# Patient Record
Sex: Female | Born: 1980 | Race: White | Hispanic: No | Marital: Married | Smoking: Never smoker
Health system: Southern US, Community
[De-identification: ages and names within clinical notes are randomized; demographics above are authoritative.]

## PROBLEM LIST (undated history)

## (undated) ENCOUNTER — Inpatient Hospital Stay (HOSPITAL_COMMUNITY): Payer: Self-pay

## (undated) ENCOUNTER — Emergency Department (HOSPITAL_COMMUNITY): Payer: BC Managed Care – PPO

## (undated) DIAGNOSIS — R030 Elevated blood-pressure reading, without diagnosis of hypertension: Secondary | ICD-10-CM

## (undated) DIAGNOSIS — Z8669 Personal history of other diseases of the nervous system and sense organs: Secondary | ICD-10-CM

## (undated) DIAGNOSIS — K219 Gastro-esophageal reflux disease without esophagitis: Secondary | ICD-10-CM

## (undated) DIAGNOSIS — Z9221 Personal history of antineoplastic chemotherapy: Secondary | ICD-10-CM

## (undated) DIAGNOSIS — F419 Anxiety disorder, unspecified: Secondary | ICD-10-CM

## (undated) DIAGNOSIS — C801 Malignant (primary) neoplasm, unspecified: Secondary | ICD-10-CM

## (undated) DIAGNOSIS — Z800 Family history of malignant neoplasm of digestive organs: Secondary | ICD-10-CM

## (undated) DIAGNOSIS — C50412 Malignant neoplasm of upper-outer quadrant of left female breast: Secondary | ICD-10-CM

## (undated) DIAGNOSIS — Z808 Family history of malignant neoplasm of other organs or systems: Secondary | ICD-10-CM

## (undated) DIAGNOSIS — Z9889 Other specified postprocedural states: Secondary | ICD-10-CM

## (undated) DIAGNOSIS — T300 Burn of unspecified body region, unspecified degree: Secondary | ICD-10-CM

## (undated) DIAGNOSIS — T4145XA Adverse effect of unspecified anesthetic, initial encounter: Secondary | ICD-10-CM

## (undated) DIAGNOSIS — I10 Essential (primary) hypertension: Secondary | ICD-10-CM

## (undated) DIAGNOSIS — Z170 Estrogen receptor positive status [ER+]: Secondary | ICD-10-CM

## (undated) DIAGNOSIS — R112 Nausea with vomiting, unspecified: Secondary | ICD-10-CM

## (undated) HISTORY — DX: Family history of malignant neoplasm of digestive organs: Z80.0

## (undated) HISTORY — DX: Family history of malignant neoplasm of other organs or systems: Z80.8

## (undated) HISTORY — DX: Malignant (primary) neoplasm, unspecified: C80.1

---
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
## 2002-02-17 ENCOUNTER — Other Ambulatory Visit: Admission: RE | Admit: 2002-02-17 | Discharge: 2002-02-17 | Payer: Self-pay | Admitting: Obstetrics and Gynecology

## 2004-09-16 ENCOUNTER — Ambulatory Visit: Payer: Self-pay | Admitting: Internal Medicine

## 2004-09-20 ENCOUNTER — Ambulatory Visit: Payer: Self-pay | Admitting: Internal Medicine

## 2004-12-04 ENCOUNTER — Emergency Department (HOSPITAL_COMMUNITY): Admission: EM | Admit: 2004-12-04 | Discharge: 2004-12-05 | Payer: Self-pay | Admitting: Emergency Medicine

## 2005-03-28 ENCOUNTER — Emergency Department (HOSPITAL_COMMUNITY): Admission: EM | Admit: 2005-03-28 | Discharge: 2005-03-29 | Payer: Self-pay | Admitting: *Deleted

## 2005-04-20 ENCOUNTER — Ambulatory Visit: Payer: Self-pay | Admitting: Internal Medicine

## 2005-05-18 ENCOUNTER — Other Ambulatory Visit: Admission: RE | Admit: 2005-05-18 | Discharge: 2005-05-18 | Payer: Self-pay | Admitting: Obstetrics and Gynecology

## 2005-08-09 ENCOUNTER — Observation Stay (HOSPITAL_COMMUNITY): Admission: AD | Admit: 2005-08-09 | Discharge: 2005-08-10 | Payer: Self-pay | Admitting: Internal Medicine

## 2005-08-09 ENCOUNTER — Ambulatory Visit: Payer: Self-pay | Admitting: Internal Medicine

## 2005-08-10 ENCOUNTER — Ambulatory Visit: Payer: Self-pay | Admitting: Internal Medicine

## 2011-09-18 ENCOUNTER — Emergency Department (HOSPITAL_COMMUNITY)
Admission: EM | Admit: 2011-09-18 | Discharge: 2011-09-19 | Disposition: A | Discharge status: Home / Self Care | Payer: Self-pay | Attending: Emergency Medicine | Admitting: Emergency Medicine

## 2011-09-18 ENCOUNTER — Emergency Department (HOSPITAL_COMMUNITY): Payer: Self-pay

## 2011-09-18 DIAGNOSIS — S92309A Fracture of unspecified metatarsal bone(s), unspecified foot, initial encounter for closed fracture: Secondary | ICD-10-CM | POA: Insufficient documentation

## 2011-09-18 DIAGNOSIS — E860 Dehydration: Secondary | ICD-10-CM | POA: Insufficient documentation

## 2011-09-18 DIAGNOSIS — Y9289 Other specified places as the place of occurrence of the external cause: Secondary | ICD-10-CM | POA: Insufficient documentation

## 2011-09-18 DIAGNOSIS — E876 Hypokalemia: Secondary | ICD-10-CM | POA: Insufficient documentation

## 2011-09-18 DIAGNOSIS — X500XXA Overexertion from strenuous movement or load, initial encounter: Secondary | ICD-10-CM | POA: Insufficient documentation

## 2011-09-18 DIAGNOSIS — R55 Syncope and collapse: Secondary | ICD-10-CM | POA: Insufficient documentation

## 2011-09-18 LAB — CBC WITH DIFFERENTIAL/PLATELET
Basophils Absolute: 0 10*3/uL (ref 0.0–0.1)
Eosinophils Absolute: 0 10*3/uL (ref 0.0–0.7)
Eosinophils Relative: 0 % (ref 0–5)
HCT: 33.4 % — ABNORMAL LOW (ref 36.0–46.0)
Hemoglobin: 11.5 g/dL — ABNORMAL LOW (ref 12.0–15.0)
Lymphs Abs: 0.9 10*3/uL (ref 0.7–4.0)
MCH: 33.5 pg (ref 26.0–34.0)
MCV: 97.4 fL (ref 78.0–100.0)
Monocytes Absolute: 0.7 10*3/uL (ref 0.1–1.0)
Monocytes Relative: 6 % (ref 3–12)
Platelets: 271 10*3/uL (ref 150–400)
RBC: 3.43 MIL/uL — ABNORMAL LOW (ref 3.87–5.11)
WBC: 12.4 10*3/uL — ABNORMAL HIGH (ref 4.0–10.5)

## 2011-09-18 LAB — URINALYSIS, ROUTINE W REFLEX MICROSCOPIC
Bilirubin Urine: NEGATIVE
Leukocytes, UA: NEGATIVE
Nitrite: NEGATIVE
Protein, ur: NEGATIVE mg/dL
pH: 6.5 (ref 5.0–8.0)

## 2011-09-18 LAB — BASIC METABOLIC PANEL
CO2: 22 mEq/L (ref 19–32)
Calcium: 9 mg/dL (ref 8.4–10.5)
Creatinine, Ser: 0.52 mg/dL (ref 0.50–1.10)
GFR calc non Af Amer: >90 mL/min (ref >90)

## 2011-09-18 LAB — POTASSIUM: Potassium: 3.6 mEq/L (ref 3.5–5.1)

## 2011-09-18 LAB — POCT PREGNANCY, URINE: Preg Test, Ur: NEGATIVE

## 2011-09-18 MED ORDER — OXYCODONE-ACETAMINOPHEN 5-325 MG PO TABS: 1.0000 | ORAL_TABLET | Freq: Four times a day (QID) | ORAL | Status: AC | PRN

## 2011-09-18 MED ORDER — OXYCODONE-ACETAMINOPHEN 5-325 MG PO TABS
1.0000 | ORAL_TABLET | Freq: Once | ORAL | Status: AC
  Administered 2011-09-18: 1 via ORAL
  Filled 2011-09-18: qty 1

## 2011-09-18 MED ORDER — POTASSIUM CHLORIDE CRYS ER 20 MEQ PO TBCR
40.0000 meq | EXTENDED_RELEASE_TABLET | Freq: Once | ORAL | Status: AC
  Administered 2011-09-18: 40 meq via ORAL
  Filled 2011-09-18: qty 2

## 2011-09-18 MED ORDER — POTASSIUM CHLORIDE 10 MEQ/100ML IV SOLN
10.0000 meq | Freq: Once | INTRAVENOUS | Status: AC
  Administered 2011-09-18: 10 meq via INTRAVENOUS
  Filled 2011-09-18: qty 100

## 2011-09-18 MED ORDER — SODIUM CHLORIDE 0.9 % IV BOLUS (SEPSIS)
1000.0000 mL | Freq: Once | INTRAVENOUS | Status: AC
  Administered 2011-09-18: 1000 mL via INTRAVENOUS

## 2011-09-18 NOTE — ED Provider Notes (Addendum)
History     CSN: [id number]  Arrival date & time 09/18/11  1649   First MD Initiated Contact with Patient 09/18/11 1658      Chief Complaint  Patient presents with  . Seizures    (Consider location/radiation/quality/duration/timing/severity/associated sxs/prior treatment) Patient is a 31 y.o. female presenting with syncope. The history is provided by the patient.  Loss of Consciousness This is a new (EMS states that the patient was at work and became unconscious. Workers state that she was shaking and when they got there she initially was hysterical and then returned to normal after a few seconds.) problem. The current episode started less than 1 hour ago. The problem occurs constantly. The problem has been resolved. Pertinent negatives include no abdominal pain and no shortness of breath. Associated symptoms comments: Patient was sitting in a chair reading small print when she started to feel dizzy and the next thing she knew there were people standing around her. Nothing aggravates the symptoms. Nothing relieves the symptoms. She has tried nothing for the symptoms. The treatment provided significant relief.    No past medical history on file.  No past surgical history on file.  No family history on file.  History  Substance Use Topics  . Smoking status: Not on file  . Smokeless tobacco: Not on file  . Alcohol Use: Not on file    OB History    No data available      Review of Systems  Constitutional: Positive for fatigue. Negative for fever.  Respiratory: Negative for cough and shortness of breath.   Cardiovascular: Positive for syncope.  Gastrointestinal: Negative for nausea, vomiting and abdominal pain.  Genitourinary: Negative for dysuria.  All other systems reviewed and are negative.    Allergies  Review of patient's allergies indicates not on file.  Home Medications  No current outpatient prescriptions on file.  BP 142/99  Pulse 125  Temp 98.1 F (36.7  C) (Oral)  Resp 18  SpO2 100%  Physical Exam  Nursing note and vitals reviewed. Constitutional: She is oriented to person, place, and time. She appears well-developed and well-nourished. No distress.  HENT:  Head: Normocephalic and atraumatic.  Right Ear: Tympanic membrane and ear canal normal.  Left Ear: Tympanic membrane and ear canal normal.  Eyes: EOM are normal. Pupils are equal, round, and reactive to light.  Fundoscopic exam:      The right eye shows no papilledema.       The left eye shows no papilledema.  Neck: Normal range of motion. Neck supple.  Cardiovascular: Regular rhythm, normal heart sounds and intact distal pulses.  Tachycardia present.  Exam reveals no friction rub.   No murmur heard. Pulmonary/Chest: Effort normal and breath sounds normal. She has no wheezes. She has no rales.  Abdominal: Soft. Bowel sounds are normal. She exhibits no distension. There is no tenderness. There is no rebound and no guarding.  Musculoskeletal: Normal range of motion. She exhibits no tenderness.       No edema  Lymphadenopathy:    She has no cervical adenopathy.  Neurological: She is alert and oriented to person, place, and time. She has normal strength. No cranial nerve deficit or sensory deficit. Gait normal.       No photophobia  Skin: Skin is warm and dry. No rash noted.  Psychiatric: She has a normal mood and affect. Her behavior is normal.    ED Course  Procedures (including critical care time)  Labs Reviewed  CBC  WITH DIFFERENTIAL - Abnormal; Notable for the following:    WBC 12.4 (*)     RBC 3.43 (*)     Hemoglobin 11.5 (*)     HCT 33.4 (*)     Neutrophils Relative 87 (*)     Neutro Abs 10.8 (*)     Lymphocytes Relative 7 (*)     All other components within normal limits  BASIC METABOLIC PANEL - Abnormal; Notable for the following:    Sodium 132 (*)     Potassium 2.8 (*)     Glucose, Bld 115 (*)     All other components within normal limits  URINALYSIS,  ROUTINE W REFLEX MICROSCOPIC  POCT PREGNANCY, URINE   Ct Head Wo Contrast  09/18/2011  *RADIOLOGY REPORT*  Clinical Data: Seizure.  CT HEAD WITHOUT CONTRAST  Technique:  Contiguous axial images were obtained from the base of the skull through the vertex without contrast.  Comparison: None.  Findings: The brain stem, cerebellum, cerebral peduncles, thalami, basal ganglia, basilar cisterns, and ventricular system appear unremarkable.  No intracranial hemorrhage, mass lesion, or acute infarction is identified.  Left parietal scalp hematoma noted.  IMPRESSION:  1.  Left frontal scalp hematoma.   Otherwise, no significant abnormality identified.  Original Report Authenticated By: Dellia Cloud, M.D.   Dg Foot Complete Left  09/18/2011  *RADIOLOGY REPORT*  Clinical Data: Left foot pain after twisting of the left foot. Seizure.  LEFT FOOT - COMPLETE 3+ VIEW  Comparison: None.  Findings: There are transverse minimally displaced fractures of the proximal left second, third, and fourth metatarsal shafts.  No focal bone lesion or bone destruction.  Mild soft tissue swelling.  IMPRESSION: Transverse fractures of the proximal left second, third, and fourth metatarsal bones.  Original Report Authenticated By: Marlon Pel, M.D.    Date: 09/18/2011  Rate: 114, artifact present  Rhythm: sinus tachycardia  QRS Axis: normal  Intervals: normal  ST/T Wave abnormalities: normal  Conduction Disutrbances:none  Narrative Interpretation:   Old EKG Reviewed: none available    1. Hypokalemia   2. Dehydration   3. Metatarsal fracture   4. Syncope       MDM   Patient with no significant medical history who has what sounds like a syncopal event today. She states she was reading small print and started feeling dizzy. Bystanders state she slumped over and had shaking however she did not have tongue biting or incontinence. EMS states she woke up and initially was hysterical but then was normal. This does  not sound like a postictal phase as it only lasted several seconds and then she returned to normal. Patient denies any symptoms on exam currently. Her LMP was over a month ago and she states she may be pregnant. She has a normal neurologic exam and no focal findings. CBC, BMP, UA, UPT, EKG, head CT pending.  8:15 PM Labs wnl other than hypokalemia today and presistent tachy.  Pt is moderately dehydrated but not pregnant.  Will give potassium and a second bolus.  Pt is assymptomatic at this time.  10:29 PM When pt got up to go to the bathroom pain in the left foot.  Ecchymosis and pain over the distal foot.  Plain films show metatarsal fx of 2-4 toes.  Will place in hard sole shoe with crutches and f/u.  Improved tachycardia from 140s to 110 here. Feel that there persistent tachycardia and is due to amphetamine use through Adderall. Patient states she's going to speak  with her doctor about going off of the Adderall she always feels a bit jittery    Gwyneth Sprout, MD 09/18/11 2016  Gwyneth Sprout, MD 09/18/11 2230  Gwyneth Sprout, MD 09/18/11 6213  Gwyneth Sprout, MD 09/18/11 2315

## 2011-09-18 NOTE — ED Notes (Signed)
Pt given food and orange juice. Okayed by EDP.

## 2011-09-18 NOTE — ED Notes (Signed)
Patient waiting to go home

## 2011-09-18 NOTE — ED Notes (Signed)
To ED, via EMS, for eval of witnessed seizure. Pt was at work, told her coworkers that her vision was blurred, slumped in the chair and had full body seizure. No Hx of seizures. EMS described pt as being posticle on their arrival. Pt now alert.

## 2011-09-18 NOTE — Progress Notes (Signed)
Orthopedic Tech Progress Note Patient Details:  Bethany Hanna Jul 11, 1980 [id number] XL postop shoe and crutches Ortho Devices Type of Ortho Device: Crutches;Postop boot Ortho Device/Splint Location: applied to Left foot Ortho Device/Splint Interventions: Application   Asia R Thompson 09/18/2011, 11:47 PM

## 2011-09-19 NOTE — ED Notes (Signed)
Instructed on use of crutches, pain meds and when to call MD  Voiced understanding

## 2012-01-19 ENCOUNTER — Encounter: Payer: Self-pay | Admitting: Family Medicine

## 2012-01-19 ENCOUNTER — Ambulatory Visit: Payer: Self-pay | Admitting: Family Medicine

## 2012-01-19 VITALS — BP 140/95 | HR 108 | Temp 97.7°F | Resp 18 | Ht 69.0 in | Wt 164.6 lb

## 2012-01-19 DIAGNOSIS — R1110 Vomiting, unspecified: Secondary | ICD-10-CM

## 2012-01-19 DIAGNOSIS — R110 Nausea: Secondary | ICD-10-CM

## 2012-01-19 LAB — POCT URINALYSIS DIPSTICK
Glucose, UA: NEGATIVE
Leukocytes, UA: NEGATIVE
Nitrite, UA: NEGATIVE

## 2012-01-19 LAB — POCT UA - MICROSCOPIC ONLY
Casts, Ur, LPF, POC: NEGATIVE
Mucus, UA: NEGATIVE

## 2012-01-19 LAB — POCT CBC
HCT, POC: 42.7 % (ref 37.7–47.9)
Hemoglobin: 13.2 g/dL (ref 12.2–16.2)
Lymph, poc: 2 (ref 0.6–3.4)
MCHC: 30.9 g/dL — AB (ref 31.8–35.4)
MCV: 91.6 fL (ref 80–97)
MID (cbc): 0.5 (ref 0–0.9)
RBC: 4.66 M/uL (ref 4.04–5.48)
WBC: 15.1 10*3/uL — AB (ref 4.6–10.2)

## 2012-01-19 MED ORDER — ONDANSETRON HCL 8 MG PO TABS: 8.0000 mg | ORAL_TABLET | Freq: Three times a day (TID) | ORAL | Status: DC | PRN

## 2012-01-19 MED ORDER — ONDANSETRON 8 MG PO TBDP: 8.0000 mg | ORAL_TABLET | Freq: Three times a day (TID) | ORAL | Status: DC | PRN

## 2012-01-19 MED ORDER — ONDANSETRON 4 MG PO TBDP
8.0000 mg | ORAL_TABLET | Freq: Once | ORAL | Status: AC
  Administered 2012-01-19: 8 mg via ORAL

## 2012-01-19 NOTE — Patient Instructions (Signed)
Work on slow sips of fluids. Fill and use the zofran rx if you need it.  If you feel like eating start with bland foods such as crackers, bananas, toast, applesauce.  If you are not getting better and able to tolerate oral fluids tomorrow please come back in and we can give you more fluids. Alternatively if you become more ill tonight you can go to the ED for further treatment

## 2012-01-19 NOTE — Progress Notes (Signed)
[facility] [address] [place] [address] [phone]  Date:  01/19/2012   Name:  Bethany Hanna   DOB:  1980/03/22   MRN:  [id number]  PCP:  Janace Hoard, MD    Chief Complaint: Emesis   History of Present Illness:  Bethany Hanna is a 31 y.o. very pleasant female patient who presents with the following:  She noted onset of illness at 0430 this am- she woke up with nausea and vomiting.  She has been very nauseated all day and has thrown up repeatedly.  She has thrown up everything she tried to drink, she has not tried to eat anything.  She does not have any particular pain, but just feels tired and achy all over.   2 weeks ago she was ill with a bad cold- fever, chills. She may have had the flu.  She finally got better last weekend.    Last night she went out to dinner and had a few drinks but nothing excessive.  No one else who ate with them got sick.   She has not had diarrhea.  No abdominal pain.  No urinary symptoms.  She has not noted a fever.   She also did have her period about 2 weeks ago- she is now having some spotting.  However, this is not too unusual for her.  She and her husband are trying to conceive so she is not on any contraception currently.  She is on PNV but no other medications at this time  She is generally healthy.    There is no problem list on file for this patient.   No past medical history on file.  No past surgical history on file.  History  Substance Use Topics  . Smoking status: Not on file  . Smokeless tobacco: Not on file  . Alcohol Use: Not on file    No family history on file.  No Known Allergies  Medication list has been reviewed and updated.  Current Outpatient Prescriptions on File Prior to Visit  Medication Sig Dispense Refill  . ibuprofen (ADVIL,MOTRIN) 200 MG tablet Take 600 mg by mouth every 6 (six) hours as needed. For pain      . Prenatal Vit-Fe Fumarate-FA (PRENATAL MULTIVITAMIN) TABS  Take 1 tablet by mouth daily.      Marland Kitchen amphetamine-dextroamphetamine (ADDERALL XR) 30 MG 24 hr capsule Take 30 mg by mouth daily.        Review of Systems:  As per HPI- otherwise negative.    Physical Examination: Filed Vitals:   01/19/12 1637  BP: 124/70  Pulse: 121  Temp: 97.5 F (36.4 C)  Resp: 18   Filed Vitals:   01/19/12 1637  Height: 5\' 9"  (1.753 m)  Weight: 164 lb 9.6 oz (74.662 kg)   Body mass index is 24.31 kg/(m^2). Ideal Body Weight: Weight in (lb) to have BMI = 25: 168.9   GEN: WDWN, NAD, Non-toxic, A & O x 3.   HEENT: Atraumatic, Normocephalic. Neck supple. No masses, No LAD.  Bilateral TM wnl, oropharynx normal.  PEERL,EOMI.   Ears and Nose: No external deformity. CV: RRR, No M/G/R. No JVD. No thrill. No extra heart sounds. Tachycardia.  PULM: CTA B, no wheezes, crackles, rhonchi. No retractions. No resp. distress. No accessory muscle use. ABD: S, NT, ND, +BS. No rebound. No HSM. Abdomen is benign.  No CVA tenderness  EXTR: No c/c/e NEURO Normal gait.  PSYCH: Normally interactive. Conversant. Not depressed  or anxious appearing.  Calm demeanor.   Results for orders placed in visit on 01/19/12  POCT CBC      Component Value Range   WBC 15.1 (*) 4.6 - 10.2 K/uL   Lymph, poc 2.0  0.6 - 3.4   POC LYMPH PERCENT 13.0  10 - 50 %L   MID (cbc) 0.5  0 - 0.9   POC MID % 3.4  0 - 12 %M   POC Granulocyte 12.6 (*) 2 - 6.9   Granulocyte percent 83.6 (*) 37 - 80 %G   RBC 4.66  4.04 - 5.48 M/uL   Hemoglobin 13.2  12.2 - 16.2 g/dL   HCT, POC 98.1  19.1 - 47.9 %   MCV 91.6  80 - 97 fL   MCH, POC 28.3  27 - 31.2 pg   MCHC 30.9 (*) 31.8 - 35.4 g/dL   RDW, POC 47.8     Platelet Count, POC 527 (*) 142 - 424 K/uL   MPV 7.7  0 - 99.8 fL  POCT URINALYSIS DIPSTICK      Component Value Range   Color, UA light yellow     Clarity, UA clear     Glucose, UA neg     Bilirubin, UA neg     Ketones, UA 160     Spec Grav, UA 1.020     Blood, UA large     pH, UA 7.0      Protein, UA neg     Urobilinogen, UA 0.2     Nitrite, UA neg     Leukocytes, UA Negative    POCT UA - MICROSCOPIC ONLY      Component Value Range   WBC, Ur, HPF, POC 0-1     RBC, urine, microscopic 1-3     Bacteria, U Microscopic 1+     Mucus, UA neg     Epithelial cells, urine per micros 1-2     Crystals, Ur, HPF, POC neg     Casts, Ur, LPF, POC neg     Yeast, UA neg    POCT URINE PREGNANCY      Component Value Range   Preg Test, Ur Negative      Given zofran 8mg  ODT and also 2 L of IV NS for hydration.  She felt better and was able to eat saltines and drink gatorade. Tachycardia improved.   Assessment and Plan: 1. Vomiting  POCT CBC, POCT urinalysis dipstick, POCT UA - Microscopic Only, ondansetron (ZOFRAN-ODT) disintegrating tablet 8 mg, POCT urine pregnancy, ondansetron (ZOFRAN) 8 MG tablet, DISCONTINUED: ondansetron (ZOFRAN-ODT) 8 MG disintegrating tablet  2. Nausea  POCT urinalysis dipstick, POCT UA - Microscopic Only, ondansetron (ZOFRAN-ODT) disintegrating tablet 8 mg   See patient instructions for more details.  Bethany Hanna felt better following her treatment and was comfortable to go home with her husband.  She did not vomit during the couple of hours she was here in clinic.   See patient instructions for more details.    She has a little bit of bacteria in her urine- however no UTI symptoms.  She will let us know if she has any UTI symptoms.   Meds ordered this encounter  Medications  . acetaminophen (TYLENOL) 325 MG tablet    Sig: Take 325 mg by mouth every 6 (six) hours as needed.  Marland Kitchen DISCONTD: ondansetron (ZOFRAN-ODT) 8 MG disintegrating tablet    Sig: Take 1 tablet (8 mg total) by mouth every 8 (eight) hours as needed for  nausea.    Dispense:  30 tablet    Refill:  0  . ondansetron (ZOFRAN-ODT) disintegrating tablet 8 mg    Sig:   . ondansetron (ZOFRAN) 8 MG tablet    Sig: Take 1 tablet (8 mg total) by mouth every 8 (eight) hours as needed for nausea.    Dispense:   5 tablet    Refill:  0      Ceniyah Thorp, MD

## 2012-01-20 ENCOUNTER — Telehealth: Payer: Self-pay | Admitting: Family Medicine

## 2012-01-20 DIAGNOSIS — D72829 Elevated white blood cell count, unspecified: Secondary | ICD-10-CM

## 2012-01-20 NOTE — Telephone Encounter (Signed)
Called and spoke with Bethany Hanna- she is feeling much better.  She will let us know if she gets worse again. Also asked her to have a recheck of her CBC at her convenience to ensure her leukocytosis resolves- she plans to do this in a couple of weeks.  Will leave an order on her chart, she will not need to see a provider to have this done.

## 2013-06-24 ENCOUNTER — Other Ambulatory Visit (HOSPITAL_COMMUNITY): Payer: Self-pay | Admitting: Obstetrics & Gynecology

## 2013-06-24 DIAGNOSIS — IMO0002 Reserved for concepts with insufficient information to code with codable children: Secondary | ICD-10-CM

## 2013-06-26 ENCOUNTER — Encounter (INDEPENDENT_AMBULATORY_CARE_PROVIDER_SITE_OTHER): Payer: Self-pay

## 2013-06-26 ENCOUNTER — Ambulatory Visit (HOSPITAL_COMMUNITY)
Admission: RE | Admit: 2013-06-26 | Discharge: 2013-06-26 | Disposition: A | Discharge status: Home / Self Care | Payer: BC Managed Care – PPO | Source: Ambulatory Visit | Attending: Obstetrics & Gynecology | Admitting: Obstetrics & Gynecology

## 2013-06-26 DIAGNOSIS — IMO0002 Reserved for concepts with insufficient information to code with codable children: Secondary | ICD-10-CM

## 2013-06-26 DIAGNOSIS — N979 Female infertility, unspecified: Secondary | ICD-10-CM | POA: Insufficient documentation

## 2013-06-26 MED ORDER — IOHEXOL 300 MG/ML  SOLN
20.0000 mL | Freq: Once | INTRAMUSCULAR | Status: AC | PRN
  Administered 2013-06-26: 20 mL

## 2013-07-30 ENCOUNTER — Other Ambulatory Visit: Payer: Self-pay | Admitting: Obstetrics & Gynecology

## 2013-07-31 ENCOUNTER — Ambulatory Visit (HOSPITAL_COMMUNITY)
Admission: RE | Admit: 2013-07-31 | Discharge: 2013-07-31 | Disposition: A | Discharge status: Home / Self Care | Payer: BC Managed Care – PPO | Source: Ambulatory Visit | Attending: Obstetrics & Gynecology | Admitting: Obstetrics & Gynecology

## 2013-07-31 ENCOUNTER — Encounter (HOSPITAL_COMMUNITY)
Admission: RE | Disposition: A | Discharge status: Home / Self Care | Payer: Self-pay | Source: Ambulatory Visit | Attending: Obstetrics & Gynecology

## 2013-07-31 ENCOUNTER — Encounter (HOSPITAL_COMMUNITY): Payer: BC Managed Care – PPO | Admitting: Anesthesiology

## 2013-07-31 ENCOUNTER — Ambulatory Visit (HOSPITAL_COMMUNITY): Payer: BC Managed Care – PPO | Admitting: Anesthesiology

## 2013-07-31 DIAGNOSIS — R9389 Abnormal findings on diagnostic imaging of other specified body structures: Secondary | ICD-10-CM | POA: Insufficient documentation

## 2013-07-31 DIAGNOSIS — N854 Malposition of uterus: Secondary | ICD-10-CM | POA: Insufficient documentation

## 2013-07-31 DIAGNOSIS — N840 Polyp of corpus uteri: Secondary | ICD-10-CM | POA: Insufficient documentation

## 2013-07-31 DIAGNOSIS — N921 Excessive and frequent menstruation with irregular cycle: Secondary | ICD-10-CM | POA: Insufficient documentation

## 2013-07-31 HISTORY — PX: DILATATION & CURRETTAGE/HYSTEROSCOPY WITH RESECTOCOPE: SHX5572

## 2013-07-31 LAB — CBC
HCT: 42 % (ref 36.0–46.0)
HEMOGLOBIN: 14 g/dL (ref 12.0–15.0)
MCH: 30.8 pg (ref 26.0–34.0)
MCHC: 33.3 g/dL (ref 30.0–36.0)
MCV: 92.3 fL (ref 78.0–100.0)
Platelets: 384 10*3/uL (ref 150–400)
RBC: 4.55 MIL/uL (ref 3.87–5.11)
RDW: 12.4 % (ref 11.5–15.5)
WBC: 10.2 10*3/uL (ref 4.0–10.5)

## 2013-07-31 LAB — PREGNANCY, URINE: Preg Test, Ur: NEGATIVE

## 2013-07-31 SURGERY — DILATATION & CURETTAGE/HYSTEROSCOPY WITH RESECTOCOPE
Anesthesia: General | Site: Vagina

## 2013-07-31 MED ORDER — FENTANYL CITRATE 0.05 MG/ML IJ SOLN
INTRAMUSCULAR | Status: DC | PRN
  Administered 2013-07-31 (×2): 50 ug via INTRAVENOUS
  Administered 2013-07-31: 100 ug via INTRAVENOUS
  Administered 2013-07-31: 50 ug via INTRAVENOUS

## 2013-07-31 MED ORDER — FENTANYL CITRATE 0.05 MG/ML IJ SOLN
INTRAMUSCULAR | Status: AC
  Filled 2013-07-31: qty 2

## 2013-07-31 MED ORDER — ONDANSETRON HCL 4 MG/2ML IJ SOLN
INTRAMUSCULAR | Status: AC
  Filled 2013-07-31: qty 2

## 2013-07-31 MED ORDER — FENTANYL CITRATE 0.05 MG/ML IJ SOLN
INTRAMUSCULAR | Status: AC
  Filled 2013-07-31: qty 5

## 2013-07-31 MED ORDER — ONDANSETRON HCL 4 MG/2ML IJ SOLN
INTRAMUSCULAR | Status: DC | PRN
  Administered 2013-07-31: 4 mg via INTRAVENOUS

## 2013-07-31 MED ORDER — CEFAZOLIN SODIUM-DEXTROSE 2-3 GM-% IV SOLR
2.0000 g | INTRAVENOUS | Status: AC
  Administered 2013-07-31: 2 g via INTRAVENOUS

## 2013-07-31 MED ORDER — KETOROLAC TROMETHAMINE 30 MG/ML IJ SOLN
INTRAMUSCULAR | Status: DC | PRN
  Administered 2013-07-31: 30 mg via INTRAMUSCULAR
  Administered 2013-07-31: 30 mg via INTRAVENOUS

## 2013-07-31 MED ORDER — LACTATED RINGERS IV SOLN
INTRAVENOUS | Status: DC
  Administered 2013-07-31 (×2): via INTRAVENOUS

## 2013-07-31 MED ORDER — GLYCINE 1.5 % IR SOLN
Status: DC | PRN
  Administered 2013-07-31: 3000 mL

## 2013-07-31 MED ORDER — LIDOCAINE HCL (CARDIAC) 20 MG/ML IV SOLN
INTRAVENOUS | Status: DC | PRN
  Administered 2013-07-31: 75 mg via INTRAVENOUS

## 2013-07-31 MED ORDER — MIDAZOLAM HCL 2 MG/2ML IJ SOLN
INTRAMUSCULAR | Status: DC | PRN
  Administered 2013-07-31: 2 mg via INTRAVENOUS

## 2013-07-31 MED ORDER — PROMETHAZINE HCL 25 MG/ML IJ SOLN: 6.2500 mg | INTRAMUSCULAR | Status: DC | PRN

## 2013-07-31 MED ORDER — FENTANYL CITRATE 0.05 MG/ML IJ SOLN
25.0000 ug | INTRAMUSCULAR | Status: DC | PRN
  Administered 2013-07-31: 25 ug via INTRAVENOUS
  Administered 2013-07-31: 50 ug via INTRAVENOUS
  Administered 2013-07-31: 25 ug via INTRAVENOUS
  Administered 2013-07-31: 50 ug via INTRAVENOUS

## 2013-07-31 MED ORDER — KETOROLAC TROMETHAMINE 30 MG/ML IJ SOLN
INTRAMUSCULAR | Status: AC
  Filled 2013-07-31: qty 1

## 2013-07-31 MED ORDER — MIDAZOLAM HCL 2 MG/2ML IJ SOLN
INTRAMUSCULAR | Status: AC
  Filled 2013-07-31: qty 2

## 2013-07-31 MED ORDER — PROPOFOL 10 MG/ML IV EMUL
INTRAVENOUS | Status: AC
  Filled 2013-07-31: qty 20

## 2013-07-31 MED ORDER — CHLOROPROCAINE HCL 1 % IJ SOLN
INTRAMUSCULAR | Status: AC
  Filled 2013-07-31: qty 30

## 2013-07-31 MED ORDER — OXYCODONE-ACETAMINOPHEN 7.5-325 MG PO TABS: 1.0000 | ORAL_TABLET | ORAL | Status: DC | PRN

## 2013-07-31 MED ORDER — CHLOROPROCAINE HCL 1 % IJ SOLN
INTRAMUSCULAR | Status: DC | PRN
  Administered 2013-07-31: 20 mL

## 2013-07-31 MED ORDER — PROPOFOL 10 MG/ML IV BOLUS
INTRAVENOUS | Status: DC | PRN
  Administered 2013-07-31: 200 mg via INTRAVENOUS

## 2013-07-31 MED ORDER — KETOROLAC TROMETHAMINE 30 MG/ML IJ SOLN: 15.0000 mg | Freq: Once | INTRAMUSCULAR | Status: DC | PRN

## 2013-07-31 MED ORDER — CEFAZOLIN SODIUM-DEXTROSE 2-3 GM-% IV SOLR
INTRAVENOUS | Status: AC
  Filled 2013-07-31: qty 50

## 2013-07-31 MED ORDER — MEPERIDINE HCL 25 MG/ML IJ SOLN: 6.2500 mg | INTRAMUSCULAR | Status: DC | PRN

## 2013-07-31 MED ORDER — LIDOCAINE HCL (CARDIAC) 20 MG/ML IV SOLN
INTRAVENOUS | Status: AC
  Filled 2013-07-31: qty 5

## 2013-07-31 MED ORDER — MIDAZOLAM HCL 2 MG/2ML IJ SOLN: 0.5000 mg | Freq: Once | INTRAMUSCULAR | Status: DC | PRN

## 2013-07-31 SURGICAL SUPPLY — 24 items
ABLATOR ENDOMETRIAL BIPOLAR (ABLATOR) IMPLANT
CANISTER SUCT 3000ML (MISCELLANEOUS) ×2 IMPLANT
CATH ROBINSON RED A/P 16FR (CATHETERS) ×2 IMPLANT
CLOTH BEACON ORANGE TIMEOUT ST (SAFETY) ×2 IMPLANT
CONTAINER PREFILL 10% NBF 60ML (FORM) ×4 IMPLANT
DRAPE HYSTEROSCOPY (DRAPE) ×2 IMPLANT
DRSG TELFA 3X8 NADH (GAUZE/BANDAGES/DRESSINGS) ×2 IMPLANT
ELECT REM PT RETURN 9FT ADLT (ELECTROSURGICAL) ×2
ELECTRODE REM PT RTRN 9FT ADLT (ELECTROSURGICAL) ×1 IMPLANT
GLOVE BIO SURGEON STRL SZ 6.5 (GLOVE) ×2 IMPLANT
GLOVE BIOGEL PI IND STRL 7.0 (GLOVE) ×1 IMPLANT
GLOVE BIOGEL PI INDICATOR 7.0 (GLOVE) ×1
GOWN STRL REUS W/TWL LRG LVL3 (GOWN DISPOSABLE) ×4 IMPLANT
LOOP ANGLED CUTTING 22FR (CUTTING LOOP) ×1 IMPLANT
NDL SPNL 22GX3.5 QUINCKE BK (NEEDLE) ×1 IMPLANT
NEEDLE SPNL 22GX3.5 QUINCKE BK (NEEDLE) ×2 IMPLANT
PACK VAGINAL MINOR WOMEN LF (CUSTOM PROCEDURE TRAY) ×2 IMPLANT
PAD DRESSING TELFA 3X8 NADH (GAUZE/BANDAGES/DRESSINGS) ×1 IMPLANT
PAD OB MATERNITY 4.3X12.25 (PERSONAL CARE ITEMS) ×2 IMPLANT
SET TUBING HYSTEROSCOPY 2 NDL (TUBING) ×1 IMPLANT
SYR CONTROL 10ML LL (SYRINGE) ×2 IMPLANT
TOWEL OR 17X24 6PK STRL BLUE (TOWEL DISPOSABLE) ×4 IMPLANT
TUBE HYSTEROSCOPY W Y-CONNECT (TUBING) ×1 IMPLANT
WATER STERILE IRR 1000ML POUR (IV SOLUTION) ×2 IMPLANT

## 2013-07-31 NOTE — Op Note (Signed)
07/31/2013  11:12 AM  PATIENT:  Bethany Hanna  33 y.o. female  PRE-OPERATIVE DIAGNOSIS:  Endometrial Polyps  POST-OPERATIVE DIAGNOSIS:  Endometrial polyps  PROCEDURE:  Procedure(s): DILATATION & CURETTAGE/HYSTEROSCOPY WITH RESECTOCOPE  SURGEON:  Surgeon(s): Princess Bruins, MD  ASSISTANTS: none   ANESTHESIA:   general  PROCEDURE:  Under general anesthesia with laryngeal mask the patient is in lithotomy position. She is prepped with Betadine on the suprapubic, vulvar and vaginal areas. She is draped as usual. The vaginal exam revealed an anteverted uterus, normal adnexae.  The speculum is inserted in the vagina. The anterior lip of the cervix is grasped with a tenaculum. The hysterometry is at 8 cm. Dilation of the cervix with Hegar dilators up to #25 without difficulty.  The operative hysteroscope with the loop is inserted in the intrauterine cavity.  We note a thick endometrium anteriorly and posteriorly with polypoid projections.  Both ostia are well seen.  Pictures are taken.  We used the loop to resect the polypoid projections anteriorly and posteriorly.  We then proceed with a systematic curettage of the intrauterine cavity on all surfaces with a sharp curet.  The curettage was done very gently in this patient that has an infertility problem and desires conception.  We then go back with the hysteroscope and the confines a normal intrauterine cavity with good hemostasis. The pictures are taken again.  We removed all instruments. Hemostasis is adequate on the cervix as well.  The speculum is removed. The patient is brought to recovery room in good and stable status.  ESTIMATED BLOOD LOSS:  25 cc   Intake/Output Summary (Last 24 hours) at 07/31/13 1112 Last data filed at 07/31/13 1110  Gross per 24 hour  Intake   1200 ml  Output     25 ml  Net   1175 ml     BLOOD ADMINISTERED:none   LOCAL MEDICATIONS USED:  Nesacaine 1% 20 cc  SPECIMEN:  Source of Specimen:  Resection  material, polypoid, Endometrial curettings  DISPOSITION OF SPECIMEN:  PATHOLOGY  COUNTS:  YES  PLAN OF CARE: Transfer to PACU  Princess Bruins MD  07/31/2013 at 11:12 am

## 2013-07-31 NOTE — Anesthesia Preprocedure Evaluation (Addendum)
Anesthesia Evaluation  Patient identified by MRN, date of birth, ID band Patient awake    Reviewed: Allergy & Precautions, H&P , Patient's Chart, lab work & pertinent test results, reviewed documented beta blocker date and time   History of Anesthesia Complications Negative for: history of anesthetic complications  Airway Mallampati: II  TM Distance: >3 FB Neck ROM: full    Dental   Pulmonary  breath sounds clear to auscultation        Cardiovascular Exercise Tolerance: Good Rhythm:regular Rate:Normal     Neuro/Psych negative psych ROS   GI/Hepatic   Endo/Other    Renal/GU      Musculoskeletal   Abdominal   Peds  Hematology   Anesthesia Other Findings   Reproductive/Obstetrics                             Anesthesia Physical Anesthesia Plan  ASA: I  Anesthesia Plan: General LMA   Post-op Pain Management:    Induction:   Airway Management Planned:   Additional Equipment:   Intra-op Plan:   Post-operative Plan:   Informed Consent: I have reviewed the patients History and Physical, chart, labs and discussed the procedure including the risks, benefits and alternatives for the proposed anesthesia with the patient or authorized representative who has indicated his/her understanding and acceptance.   Dental Advisory Given  Plan Discussed with: CRNA, Surgeon and Anesthesiologist  Anesthesia Plan Comments:         Anesthesia Quick Evaluation  

## 2013-07-31 NOTE — Discharge Instructions (Addendum)
Hysteroscopy, Care After Refer to this sheet in the next few weeks. These instructions provide you with information on caring for yourself after your procedure. Your health care provider may also give you more specific instructions. Your treatment has been planned according to current medical practices, but problems sometimes occur. Call your health care provider if you have any problems or questions after your procedure.  WHAT TO EXPECT AFTER THE PROCEDURE After your procedure, it is typical to have the following:  You may have some cramping. This normally lasts for a couple days.  You may have bleeding. This can vary from light spotting for a few days to menstrual-like bleeding for 3 7 days. HOME CARE INSTRUCTIONS  Rest for the first 1 2 days after the procedure.  Only take over-the-counter or prescription medicines as directed by your health care provider. Do not take aspirin. It can increase the chances of bleeding.  Take showers instead of baths for 2 weeks or as directed by your health care provider.  Do not drive for 24 hours or as directed.  Do not drink alcohol while taking pain medicine.  Do not use tampons, douche, or have sexual intercourse for 2 weeks or until your health care provider says it is okay.  Take your temperature twice a day for 4 5 days. Write it down each time.  Follow your health care provider's advice about diet, exercise, and lifting.  If you develop constipation, you may:  Take a mild laxative if your health care provider approves.  Add bran foods to your diet.  Drink enough fluids to keep your urine clear or pale yellow.  Try to have someone with you or available to you for the first 24 48 hours, especially if you were given a general anesthetic.  Follow up with your health care provider as directed. SEEK MEDICAL CARE IF:  You feel dizzy or lightheaded.  You feel sick to your stomach (nauseous).  You have abnormal vaginal discharge.  You  have a rash.  You have pain that is not controlled with medicine. SEEK IMMEDIATE MEDICAL CARE IF:  You have bleeding that is heavier than a normal menstrual period.  You have a fever.  You have increasing cramps or pain, not controlled with medicine.  You have new belly (abdominal) pain.  You pass out.  You have pain in the tops of your shoulders (shoulder strap areas).  You have shortness of breath. Document Released: 12/11/2012 Document Reviewed: 09/19/2012 [facility] Patient Information 2014 [place], [place]. DISCHARGE INSTRUCTIONS: D&C / D&E The following instructions have been prepared to help you care for yourself upon your return home.   Personal hygiene:  Use sanitary pads for vaginal drainage, not tampons.  Shower the day after your procedure.  NO tub baths, pools or Jacuzzis for 2-3 weeks.  Wipe front to back after using the bathroom.  Activity and limitations:  Do NOT drive or operate any equipment for 24 hours. The effects of anesthesia are still present and drowsiness may result.  Do NOT rest in bed all day.  Walking is encouraged.  Walk up and down stairs slowly.  You may resume your normal activity in one to two days or as indicated by your physician.  Sexual activity: NO intercourse for at least 2 weeks after the procedure, or as indicated by your physician.  Diet: Eat a light meal as desired this evening. You may resume your usual diet tomorrow.  Return to work: You may resume your work activities in  one to two days or as indicated by your doctor.  What to expect after your surgery: Expect to have vaginal bleeding/discharge for 2-3 days and spotting for up to 10 days. It is not unusual to have soreness for up to 1-2 weeks. You may have a slight burning sensation when you urinate for the first day. Mild cramps may continue for a couple of days. You may have a regular period in 2-6 weeks.  Call your doctor for any of the following:  Excessive vaginal  bleeding, saturating and changing one pad every hour.  Inability to urinate 6 hours after discharge from hospital.  Pain not relieved by pain medication.  Fever of 100.4 F or greater.  Unusual vaginal discharge or odor.   Call for an appointment:    Patients signature: ______________________  Nurses signature ________________________  Support person's signature_______________________

## 2013-07-31 NOTE — Transfer of Care (Signed)
Immediate Anesthesia Transfer of Care Note  Patient: Bethany Hanna  Procedure(s) Performed: Procedure(s) with comments: DILATATION & CURETTAGE/HYSTEROSCOPY WITH RESECTOCOPE (N/A) - 1 hr.  Patient Location: PACU  Anesthesia Type:General  Level of Consciousness: awake, alert  and oriented  Airway & Oxygen Therapy: Patient Spontanous Breathing and Patient connected to nasal cannula oxygen  Post-op Assessment: Report given to PACU RN and Post -op Vital signs reviewed and stable  Post vital signs: Reviewed and stable  Complications: No apparent anesthesia complications

## 2013-07-31 NOTE — Anesthesia Postprocedure Evaluation (Signed)
  Anesthesia Post Note  Patient: Bethany Hanna  Procedure(s) Performed: Procedure(s) (LRB): DILATATION & CURETTAGE/HYSTEROSCOPY WITH RESECTOCOPE (N/A)  Anesthesia type: GA  Patient location: PACU  Post pain: Pain level controlled  Post assessment: Post-op Vital signs reviewed  Last Vitals:  Filed Vitals:   07/31/13 1115  BP: 153/107  Pulse: 119  Temp: 37.2 C  Resp: 20    Post vital signs: Reviewed  Level of consciousness: sedated  Complications: No apparent anesthesia complications

## 2013-07-31 NOTE — Discharge Summary (Signed)
  Physician Discharge Summary  Patient ID: Bethany Hanna MRN: [id number] DOB/AGE: 32-Dec-1982 32 y.o.  Admit date: 07/31/2013 Discharge date: 07/31/2013  Admission Diagnoses: Endometrial Polyps  Discharge Diagnoses: Endometrial Polyps        Active Problems:   * No active hospital problems. *   Discharged Condition: good  Hospital Course: Outpatient  Consults: None  Treatments: surgery: Hysteroscopy with resection, D+C  Disposition: 01-Home or Self Care     Medication List         ibuprofen 200 MG tablet  Commonly known as:  ADVIL,MOTRIN  Take 600 mg by mouth every 6 (six) hours as needed for moderate pain.     oxyCODONE-acetaminophen 7.5-325 MG per tablet  Commonly known as:  PERCOCET  Take 1 tablet by mouth every 4 (four) hours as needed for pain.     prenatal multivitamin Tabs tablet  Take 1 tablet by mouth daily.           Follow-up Information   Follow up with Grete Bosko,MARIE-LYNE, MD In 3 weeks.   Specialty:  Obstetrics and Gynecology   Contact information:   [address] [place] [address] [phone]       Signed: Princess Bruins, MD 07/31/2013, 11:19 AM

## 2013-07-31 NOTE — H&P (Signed)
Bethany Hanna is an 33 y.o. female  Infertility  RP:  Endometrial polyps for [facility] resection, D+ C  Pertinent Gynecological History: Menses: flow is moderate Bleeding: intermenstrual bleeding Contraception: none Blood transfusions: none Sexually transmitted diseases: no past history Previous GYN Procedures: None  Last pap: normal    Menstrual History:  Patient's last menstrual period was 07/17/2013.    No past medical history on file.  No past surgical history on file.  No family history on file.  Social History:  has no tobacco, alcohol, and drug history on file.  Allergies: No Known Allergies  Prescriptions prior to admission  Medication Sig Dispense Refill  . ibuprofen (ADVIL,MOTRIN) 200 MG tablet Take 600 mg by mouth every 6 (six) hours as needed. For pain      . Prenatal Vit-Fe Fumarate-FA (PRENATAL MULTIVITAMIN) TABS Take 1 tablet by mouth daily.      Marland Kitchen acetaminophen (TYLENOL) 325 MG tablet Take 325 mg by mouth every 6 (six) hours as needed.      Marland Kitchen amphetamine-dextroamphetamine (ADDERALL XR) 30 MG 24 hr capsule Take 30 mg by mouth daily.      . ondansetron (ZOFRAN) 8 MG tablet Take 1 tablet (8 mg total) by mouth every 8 (eight) hours as needed for nausea.  5 tablet  0    ROS  Blood pressure 159/102, pulse 97, temperature 98.2 F (36.8 C), temperature source Oral, resp. rate 18, height 5\' 8"  (1.727 m), weight 80.74 kg (178 lb), last menstrual period 07/17/2013, SpO2 100.00%. Physical Exam  [place]:  Many small IU lesions, probable polyps.  Results for orders placed during the hospital encounter of 07/31/13 (from the past 24 hour(s))  PREGNANCY, URINE     Status: None   Collection Time    07/31/13  9:37 AM      Result Value Ref Range   Preg Test, Ur NEGATIVE  NEGATIVE    No results found.  Assessment/Plan: Endometrial polyps for [place] resection, D+C.  Surgery and risks reviewed.  Marie-Lyne Danity Schmelzer 07/31/2013, 10:11 AM

## 2013-08-01 ENCOUNTER — Encounter (HOSPITAL_COMMUNITY): Payer: Self-pay | Admitting: Obstetrics & Gynecology

## 2013-12-30 LAB — OB RESULTS CONSOLE HEPATITIS B SURFACE ANTIGEN: Hepatitis B Surface Ag: NEGATIVE

## 2013-12-30 LAB — OB RESULTS CONSOLE RUBELLA ANTIBODY, IGM: Rubella: IMMUNE

## 2013-12-30 LAB — OB RESULTS CONSOLE ABO/RH: RH TYPE: POSITIVE

## 2013-12-30 LAB — OB RESULTS CONSOLE HIV ANTIBODY (ROUTINE TESTING): HIV: NONREACTIVE

## 2013-12-30 LAB — OB RESULTS CONSOLE ANTIBODY SCREEN: ANTIBODY SCREEN: NEGATIVE

## 2013-12-30 LAB — OB RESULTS CONSOLE RPR: RPR: NONREACTIVE

## 2014-01-07 LAB — OB RESULTS CONSOLE GC/CHLAMYDIA
Chlamydia: NEGATIVE
Gonorrhea: NEGATIVE

## 2014-03-06 NOTE — L&D Delivery Note (Signed)
Delivery Note  First Stage: Labor onset: 1400 Augmentation: Pitocin Analgesia /Anesthesia intrapartum: epidural SROM at 1215 (08/06/14)  Second Stage: Complete dilation at 0950 Onset of pushing at 1110 FHR second stage 160, mod variability, no accels, variable and late decels. Cat II-Pitocin d/c'd, o2 by NRB applied  In maternal lithotomy, delivery of a viable female at 20 by CNM in OA position with restitution to LOT Nuchal cord x1, loose, reduced Copious amt port wine blood after delivery of fetal head  Third Stage: Spontaneous delivery of placenta immediately after baby @1146  Placenta disposition: routine disposal Uterine tone firm / bleeding small  2nd degree perineal laceration and right labial laceration identified  Anesthesia for repair: regional Repaired with 2-0 Vicryl rapide, 4-0 Vicryl rapide Est. Blood Loss (mL): 436 Cord double clamped, cut by FOB Cord blood sample collected   Collection of cord blood donation n/a Arterial cord blood sample n/a  Complications: repetitive decels, terminal abruption, prolonged ROM  Mom to postpartum.  Baby to Couplet care / Skin to Skin.  Newborn: Birth Weight: 8-11 Apgar Scores: 7/9 Feeding planned: breast  Julianne Handler, N MSN, CNM 08/07/2014, 12:25 PM

## 2014-05-12 LAB — OB RESULTS CONSOLE RPR: RPR: NONREACTIVE

## 2014-07-10 LAB — OB RESULTS CONSOLE GBS: GBS: NEGATIVE

## 2014-07-29 ENCOUNTER — Encounter (HOSPITAL_COMMUNITY): Payer: Self-pay

## 2014-07-29 ENCOUNTER — Inpatient Hospital Stay (HOSPITAL_COMMUNITY)
Admission: AD | Admit: 2014-07-29 | Discharge: 2014-07-29 | Disposition: A | Discharge status: Home / Self Care | Payer: PRIVATE HEALTH INSURANCE | Source: Ambulatory Visit | Attending: Obstetrics and Gynecology | Admitting: Obstetrics and Gynecology

## 2014-07-29 DIAGNOSIS — O9989 Other specified diseases and conditions complicating pregnancy, childbirth and the puerperium: Secondary | ICD-10-CM | POA: Diagnosis present

## 2014-07-29 DIAGNOSIS — Z3A38 38 weeks gestation of pregnancy: Secondary | ICD-10-CM | POA: Insufficient documentation

## 2014-07-29 NOTE — MAU Note (Signed)
Pt may be d/c to home with instructions.

## 2014-07-29 NOTE — Discharge Instructions (Signed)
Braxton Hicks Contractions °Contractions of the uterus can occur throughout pregnancy. Contractions are not always a sign that you are in labor.  °WHAT ARE BRAXTON HICKS CONTRACTIONS?  °Contractions that occur before labor are called Braxton Hicks contractions, or false labor. Toward the end of pregnancy (32-34 weeks), these contractions can develop more often and may become more forceful. This is not true labor because these contractions do not result in opening (dilatation) and thinning of the cervix. They are sometimes difficult to tell apart from true labor because these contractions can be forceful and people have different pain tolerances. You should not feel embarrassed if you go to the hospital with false labor. Sometimes, the only way to tell if you are in true labor is for your health care provider to look for changes in the cervix. °If there are no prenatal problems or other health problems associated with the pregnancy, it is completely safe to be sent home with false labor and await the onset of true labor. °HOW CAN YOU TELL THE DIFFERENCE BETWEEN TRUE AND FALSE LABOR? °False Labor °· The contractions of false labor are usually shorter and not as hard as those of true labor.   °· The contractions are usually irregular.   °· The contractions are often felt in the front of the lower abdomen and in the groin.   °· The contractions may go away when you walk around or change positions while lying down.   °· The contractions get weaker and are shorter lasting as time goes on.   °· The contractions do not usually become progressively stronger, regular, and closer together as with true labor.   °True Labor °· Contractions in true labor last 30-70 seconds, become very regular, usually become more intense, and increase in frequency.   °· The contractions do not go away with walking.   °· The discomfort is usually felt in the top of the uterus and spreads to the lower abdomen and low back.   °· True labor can be  determined by your health care provider with an exam. This will show that the cervix is dilating and getting thinner.   °WHAT TO REMEMBER °· Keep up with your usual exercises and follow other instructions given by your health care provider.   °· Take medicines as directed by your health care provider.   °· Keep your regular prenatal appointments.   °· Eat and drink lightly if you think you are going into labor.   °· If Braxton Hicks contractions are making you uncomfortable:   °¨ Change your position from lying down or resting to walking, or from walking to resting.   °¨ Sit and rest in a tub of warm water.   °¨ Drink 2-3 glasses of water. Dehydration may cause these contractions.   °¨ Do slow and deep breathing several times an hour.   °WHEN SHOULD I SEEK IMMEDIATE MEDICAL CARE? °Seek immediate medical care if: °· Your contractions become stronger, more regular, and closer together.   °· You have fluid leaking or gushing from your vagina.   °· You have a fever.   °· You pass blood-tinged mucus.   °· You have vaginal bleeding.   °· You have continuous abdominal pain.   °· You have low back pain that you never had before.   °· You feel your baby's head pushing down and causing pelvic pressure.   °· Your baby is not moving as much as it used to.   °Document Released: 02/20/2005 Document Revised: 02/25/2013 Document Reviewed: 12/02/2012 °ExitCare® Patient Information ©2015 [employer]. This information is not intended to replace advice given to you by your health care   provider. Make sure you discuss any questions you have with your health care provider. ° °

## 2014-07-29 NOTE — MAU Note (Signed)
Urine in lab 

## 2014-07-29 NOTE — MAU Note (Signed)
Pt presents complaining of contractions that are irregular but getting more intense. Denies vaginal bleeding or leaking. Reports some yellow discharge. Reports good fetal movement. 2cm in office yesterday.

## 2014-08-06 ENCOUNTER — Inpatient Hospital Stay (HOSPITAL_COMMUNITY): Payer: No Typology Code available for payment source | Admitting: Anesthesiology

## 2014-08-06 ENCOUNTER — Inpatient Hospital Stay (HOSPITAL_COMMUNITY)
Admission: AD | Admit: 2014-08-06 | Discharge: 2014-08-09 | DRG: 774 | Disposition: A | Discharge status: Home / Self Care | Payer: No Typology Code available for payment source | Source: Ambulatory Visit | Attending: Obstetrics & Gynecology | Admitting: Obstetrics & Gynecology

## 2014-08-06 ENCOUNTER — Encounter (HOSPITAL_COMMUNITY): Payer: Self-pay | Admitting: Emergency Medicine

## 2014-08-06 DIAGNOSIS — D62 Acute posthemorrhagic anemia: Secondary | ICD-10-CM | POA: Diagnosis not present

## 2014-08-06 DIAGNOSIS — O9912 Other diseases of the blood and blood-forming organs and certain disorders involving the immune mechanism complicating childbirth: Secondary | ICD-10-CM | POA: Diagnosis present

## 2014-08-06 DIAGNOSIS — O9962 Diseases of the digestive system complicating childbirth: Secondary | ICD-10-CM | POA: Diagnosis present

## 2014-08-06 DIAGNOSIS — K219 Gastro-esophageal reflux disease without esophagitis: Secondary | ICD-10-CM | POA: Diagnosis present

## 2014-08-06 DIAGNOSIS — O4593 Premature separation of placenta, unspecified, third trimester: Secondary | ICD-10-CM | POA: Diagnosis present

## 2014-08-06 DIAGNOSIS — O9902 Anemia complicating childbirth: Secondary | ICD-10-CM | POA: Diagnosis present

## 2014-08-06 DIAGNOSIS — Z3A39 39 weeks gestation of pregnancy: Secondary | ICD-10-CM | POA: Diagnosis present

## 2014-08-06 DIAGNOSIS — D72829 Elevated white blood cell count, unspecified: Secondary | ICD-10-CM | POA: Diagnosis present

## 2014-08-06 DIAGNOSIS — IMO0001 Reserved for inherently not codable concepts without codable children: Secondary | ICD-10-CM

## 2014-08-06 LAB — COMPREHENSIVE METABOLIC PANEL
ALT: 15 U/L (ref 14–54)
AST: 16 U/L (ref 15–41)
Albumin: 3.1 g/dL — ABNORMAL LOW (ref 3.5–5.0)
Alkaline Phosphatase: 143 U/L — ABNORMAL HIGH (ref 38–126)
Anion gap: 12 (ref 5–15)
BUN: 8 mg/dL (ref 6–20)
CO2: 20 mmol/L — ABNORMAL LOW (ref 22–32)
Calcium: 9.1 mg/dL (ref 8.9–10.3)
Chloride: 106 mmol/L (ref 101–111)
Creatinine, Ser: 0.67 mg/dL (ref 0.44–1.00)
GFR calc Af Amer: >60 mL/min (ref >60)
GFR calc non Af Amer: >60 mL/min (ref >60)
Glucose, Bld: 85 mg/dL (ref 65–99)
Potassium: 3.8 mmol/L (ref 3.5–5.1)
Sodium: 138 mmol/L (ref 135–145)
Total Bilirubin: 0.2 mg/dL — ABNORMAL LOW (ref 0.3–1.2)
Total Protein: 7.3 g/dL (ref 6.5–8.1)

## 2014-08-06 LAB — CBC
HEMATOCRIT: 39 % (ref 36.0–46.0)
HEMOGLOBIN: 13.4 g/dL (ref 12.0–15.0)
MCH: 29 pg (ref 26.0–34.0)
MCHC: 34.4 g/dL (ref 30.0–36.0)
MCV: 84.4 fL (ref 78.0–100.0)
Platelets: 307 10*3/uL (ref 150–400)
RBC: 4.62 MIL/uL (ref 3.87–5.11)
RDW: 14.3 % (ref 11.5–15.5)
WBC: 12.4 10*3/uL — ABNORMAL HIGH (ref 4.0–10.5)

## 2014-08-06 LAB — ABO/RH: ABO/RH(D): A POS

## 2014-08-06 LAB — TYPE AND SCREEN
ABO/RH(D): A POS
Antibody Screen: NEGATIVE

## 2014-08-06 LAB — URIC ACID: Uric Acid, Serum: 6.6 mg/dL (ref 2.3–6.6)

## 2014-08-06 MED ORDER — LIDOCAINE HCL (PF) 1 % IJ SOLN
INTRAMUSCULAR | Status: DC | PRN
  Administered 2014-08-06 (×2): 4 mL

## 2014-08-06 MED ORDER — DIPHENHYDRAMINE HCL 50 MG/ML IJ SOLN: 12.5000 mg | INTRAMUSCULAR | Status: DC | PRN

## 2014-08-06 MED ORDER — PHENYLEPHRINE 40 MCG/ML (10ML) SYRINGE FOR IV PUSH (FOR BLOOD PRESSURE SUPPORT)
80.0000 ug | PREFILLED_SYRINGE | INTRAVENOUS | Status: DC | PRN
  Filled 2014-08-06: qty 20
  Filled 2014-08-06: qty 2

## 2014-08-06 MED ORDER — LACTATED RINGERS IV SOLN
500.0000 mL | INTRAVENOUS | Status: DC | PRN
  Administered 2014-08-06: 500 mL via INTRAVENOUS
  Administered 2014-08-07: 250 mL via INTRAVENOUS

## 2014-08-06 MED ORDER — ACETAMINOPHEN 325 MG PO TABS: 650.0000 mg | ORAL_TABLET | ORAL | Status: DC | PRN

## 2014-08-06 MED ORDER — LACTATED RINGERS IV SOLN
INTRAVENOUS | Status: DC
  Administered 2014-08-06 – 2014-08-07 (×2): via INTRAVENOUS

## 2014-08-06 MED ORDER — ONDANSETRON HCL 4 MG/2ML IJ SOLN
4.0000 mg | Freq: Four times a day (QID) | INTRAMUSCULAR | Status: DC | PRN
  Administered 2014-08-07 (×2): 4 mg via INTRAVENOUS
  Filled 2014-08-06 (×2): qty 2

## 2014-08-06 MED ORDER — OXYCODONE-ACETAMINOPHEN 5-325 MG PO TABS: 2.0000 | ORAL_TABLET | ORAL | Status: DC | PRN

## 2014-08-06 MED ORDER — FENTANYL 2.5 MCG/ML BUPIVACAINE 1/10 % EPIDURAL INFUSION (WH - ANES)
14.0000 mL/h | INTRAMUSCULAR | Status: DC | PRN
Start: 2014-08-06 — End: 2014-08-07
  Administered 2014-08-06 – 2014-08-07 (×3): 14 mL/h via EPIDURAL
  Filled 2014-08-06 (×3): qty 125

## 2014-08-06 MED ORDER — FENTANYL 2.5 MCG/ML BUPIVACAINE 1/10 % EPIDURAL INFUSION (WH - ANES): 14.0000 mL/h | INTRAMUSCULAR | Status: DC | PRN

## 2014-08-06 MED ORDER — FLEET ENEMA 7-19 GM/118ML RE ENEM: 1.0000 | ENEMA | RECTAL | Status: DC | PRN

## 2014-08-06 MED ORDER — OXYTOCIN 40 UNITS IN LACTATED RINGERS INFUSION - SIMPLE MED
62.5000 mL/h | INTRAVENOUS | Status: DC
  Administered 2014-08-07: 62.5 mL/h via INTRAVENOUS

## 2014-08-06 MED ORDER — CITRIC ACID-SODIUM CITRATE 334-500 MG/5ML PO SOLN
30.0000 mL | ORAL | Status: DC | PRN
  Administered 2014-08-07: 30 mL via ORAL
  Filled 2014-08-06: qty 15

## 2014-08-06 MED ORDER — LIDOCAINE HCL (PF) 1 % IJ SOLN
30.0000 mL | INTRAMUSCULAR | Status: DC | PRN
  Filled 2014-08-06: qty 30

## 2014-08-06 MED ORDER — OXYTOCIN BOLUS FROM INFUSION: 500.0000 mL | INTRAVENOUS | Status: DC

## 2014-08-06 MED ORDER — EPHEDRINE 5 MG/ML INJ
10.0000 mg | INTRAVENOUS | Status: DC | PRN
  Filled 2014-08-06: qty 2

## 2014-08-06 MED ORDER — OXYCODONE-ACETAMINOPHEN 5-325 MG PO TABS
1.0000 | ORAL_TABLET | ORAL | Status: DC | PRN
  Administered 2014-08-07: 1 via ORAL
  Filled 2014-08-06: qty 1

## 2014-08-06 MED ORDER — PHENYLEPHRINE 40 MCG/ML (10ML) SYRINGE FOR IV PUSH (FOR BLOOD PRESSURE SUPPORT): 80.0000 ug | PREFILLED_SYRINGE | INTRAVENOUS | Status: DC | PRN

## 2014-08-06 MED ORDER — ONDANSETRON HCL 4 MG/2ML IJ SOLN: 4.0000 mg | Freq: Four times a day (QID) | INTRAMUSCULAR | Status: DC | PRN

## 2014-08-06 NOTE — MAU Note (Signed)
Membranes stripped on Tues, was 2/90.  Started leaking at work around [employer], clear fluid - still coming. Cramping as started

## 2014-08-06 NOTE — Anesthesia Preprocedure Evaluation (Signed)
Anesthesia Evaluation  Patient identified by MRN, date of birth, ID band Patient awake    Reviewed: Allergy & Precautions, H&P , Patient's Chart, lab work & pertinent test results  Airway Mallampati: III  TM Distance: >3 FB Neck ROM: full    Dental no notable dental hx. (+) Teeth Intact   Pulmonary neg pulmonary ROS,  breath sounds clear to auscultation  Pulmonary exam normal       Cardiovascular negative cardio ROS Normal cardiovascular examRhythm:regular Rate:Normal     Neuro/Psych negative neurological ROS  negative psych ROS   GI/Hepatic negative GI ROS, Neg liver ROS,   Endo/Other  Obesity  Renal/GU negative Renal ROS  negative genitourinary   Musculoskeletal   Abdominal   Peds  Hematology negative hematology ROS (+)   Anesthesia Other Findings   Reproductive/Obstetrics (+) Pregnancy                             Anesthesia Physical Anesthesia Plan  ASA:   Anesthesia Plan: Epidural   Post-op Pain Management:    Induction:   Airway Management Planned:   Additional Equipment:   Intra-op Plan:   Post-operative Plan:   Informed Consent: I have reviewed the patients History and Physical, chart, labs and discussed the procedure including the risks, benefits and alternatives for the proposed anesthesia with the patient or authorized representative who has indicated his/her understanding and acceptance.     Plan Discussed with: Anesthesiologist  Anesthesia Plan Comments:         Anesthesia Quick Evaluation

## 2014-08-06 NOTE — H&P (Signed)
  OB ADMISSION/ HISTORY & PHYSICAL:  Admission Date: 08/06/2014 12:56 PM  Admit Diagnosis: 39.2 weeks / SROM / latent labor  Bethany Hanna is a 34 y.o. female presenting for onset of labor.  Prenatal History: G1P0   EDC : 08/11/2014, Date entered prior to episode creation  Prenatal care at [facility]  Primary Ob Provider: Dr Dellis Filbert Prenatal course uncomplicated. Clomid/IUI conception. GERD.   Prenatal Labs: ABO, Rh:  A positive Antibody:  negative Rubella:   Immune RPR:   NR HBsAg:   Negative HIV:   NR GTT: normal GBS:   negative  Medical / Surgical History :  Past medical history: History reviewed. No pertinent past medical history.   Past surgical history:  Past Surgical History  Procedure Laterality Date  . Dilatation & currettage/hysteroscopy with resectocope N/A 07/31/2013    Procedure: DILATATION & CURETTAGE/HYSTEROSCOPY WITH RESECTOCOPE;  Surgeon: Princess Bruins, MD;  Location: [facility];  Service: Gynecology;  Laterality: N/A;  1 hr.    Family History: History reviewed. No pertinent family history.   Social History:  reports that she has never smoked. She does not have any smokeless tobacco history on file. She reports that she does not drink alcohol or use illicit drugs.   Allergies: Review of patient's allergies indicates no known allergies.    Current Medications at time of admission:  Prior to Admission medications   Medication Sig Start Date End Date Taking? Authorizing Provider  Doxylamine-Pyridoxine 10-10 MG TBEC Take 1 tablet by mouth as needed (nausea and vomiting in pregnancy).   Yes Historical Provider, MD  Prenatal Vit-Fe Fumarate-FA (PRENATAL MULTIVITAMIN) TABS Take 1 tablet by mouth daily.   Yes Historical Provider, MD  ranitidine (ZANTAC) 150 MG tablet Take 150 mg by mouth as needed for heartburn.    Yes Historical Provider, MD  oxyCODONE-acetaminophen (PERCOCET) 7.5-325 MG per tablet Take 1 tablet by mouth every 4 (four) hours  as needed for pain. Patient not taking: Reported on 08/06/2014 07/31/13   Princess Bruins, MD   Review of Systems: Active FM onset of mild ctx after SROM ~noon / lots of cramping all last night LOF  / SROM @ 1200 bloody show absent  Physical Exam:  VS: Initial BP 140/100  Blood pressure 128/91, pulse 110, temperature 97.8 F (36.6 C), temperature source Oral, resp. rate 20, height 5\' 8"  (1.727 m), weight 95.255 kg (210 lb), last menstrual period 07/17/2013.  General: alert and oriented, appears slightly anxious / NAD or pain Heart: RRR Lungs: Clear lung fields Abdomen: Gravid, soft and non-tender, non-distended / uterus: gravid and non-tender Extremities: no edema  Genitalia / VE: Dilation: 3.5 Effacement (%): 80 Station: -1 Exam by:: Kadden Osterhout-CNM  FHR: baseline rate 150 / variability moderate / accelerations + / no decelerations TOCO: ctx Q 2-4 minutes mild  Assessment: 39.[redacted] weeks gestation latent stage of labor with SROM FHR category 1   Plan:  Admit  Dr Benjie Karvonen notified of admission  - CNM to follow until MD out of [place], [place], MSN, [facility] 08/06/2014, 2:58 PM   MD note:  Pt assessed, H&P reviewed, Clomid/IUI conception, normal labs, GBS(-). 34 # wt gain. No Medical/Ob complications. Anticipate 7.1/2 lbs- 8 lbs, SVD.

## 2014-08-06 NOTE — Anesthesia Procedure Notes (Signed)
Epidural Patient location during procedure: OB Start time: 08/06/2014 6:24 PM  Staffing Anesthesiologist: Josephine Igo Performed by: anesthesiologist   Preanesthetic Checklist Completed: patient identified, site marked, surgical consent, pre-op evaluation, timeout performed, IV checked, risks and benefits discussed and monitors and equipment checked  Epidural Patient position: sitting Prep: site prepped and draped and DuraPrep Patient monitoring: continuous pulse ox and blood pressure Approach: midline Location: L3-L4 Injection technique: LOR air  Needle:  Needle type: Tuohy  Needle gauge: 17 G Needle length: 9 cm and 9 Needle insertion depth: 4 cm Catheter type: closed end flexible Catheter size: 19 Gauge Catheter at skin depth: 10 cm Test dose: negative and Other  Assessment Events: blood not aspirated, injection not painful, no injection resistance, negative IV test and no paresthesia  Additional Notes Patient identified. Risks and benefits discussed including failed block, incomplete  Pain control, post dural puncture headache, nerve damage, paralysis, blood pressure Changes, nausea, vomiting, reactions to medications-both toxic and allergic and post Partum back pain. All questions were answered. Patient expressed understanding and wished to proceed. Sterile technique was used throughout procedure. Epidural site was Dressed with sterile barrier dressing. No paresthesias, signs of intravascular injection Or signs of intrathecal spread were encountered.  Patient was more comfortable after the epidural was dosed. Please see RN's note for documentation of vital signs and FHR which are stable.

## 2014-08-07 ENCOUNTER — Encounter (HOSPITAL_COMMUNITY): Payer: Self-pay

## 2014-08-07 LAB — RPR: RPR Ser Ql: NONREACTIVE

## 2014-08-07 MED ORDER — OXYTOCIN 40 UNITS IN LACTATED RINGERS INFUSION - SIMPLE MED: 1.0000 m[IU]/min | INTRAVENOUS | Status: DC

## 2014-08-07 MED ORDER — DIBUCAINE 1 % RE OINT: 1.0000 "application " | TOPICAL_OINTMENT | RECTAL | Status: DC | PRN

## 2014-08-07 MED ORDER — ONDANSETRON HCL 4 MG PO TABS: 4.0000 mg | ORAL_TABLET | ORAL | Status: DC | PRN

## 2014-08-07 MED ORDER — BENZOCAINE-MENTHOL 20-0.5 % EX AERO
1.0000 "application " | INHALATION_SPRAY | CUTANEOUS | Status: DC | PRN
  Administered 2014-08-07 – 2014-08-09 (×2): 1 via TOPICAL
  Filled 2014-08-07 (×2): qty 56

## 2014-08-07 MED ORDER — ACETAMINOPHEN 325 MG PO TABS: 650.0000 mg | ORAL_TABLET | ORAL | Status: DC | PRN

## 2014-08-07 MED ORDER — TERBUTALINE SULFATE 1 MG/ML IJ SOLN
0.2500 mg | Freq: Once | INTRAMUSCULAR | Status: DC | PRN
  Filled 2014-08-07: qty 1

## 2014-08-07 MED ORDER — LANOLIN HYDROUS EX OINT: TOPICAL_OINTMENT | CUTANEOUS | Status: DC | PRN

## 2014-08-07 MED ORDER — IBUPROFEN 600 MG PO TABS
600.0000 mg | ORAL_TABLET | Freq: Four times a day (QID) | ORAL | Status: DC
  Administered 2014-08-07 – 2014-08-09 (×7): 600 mg via ORAL
  Filled 2014-08-07 (×7): qty 1

## 2014-08-07 MED ORDER — OXYTOCIN 40 UNITS IN LACTATED RINGERS INFUSION - SIMPLE MED
1.0000 m[IU]/min | INTRAVENOUS | Status: DC
  Administered 2014-08-07: 1 m[IU]/min via INTRAVENOUS
  Filled 2014-08-07: qty 1000

## 2014-08-07 MED ORDER — OXYCODONE-ACETAMINOPHEN 5-325 MG PO TABS
1.0000 | ORAL_TABLET | ORAL | Status: DC | PRN
  Administered 2014-08-07 – 2014-08-09 (×8): 1 via ORAL
  Filled 2014-08-07 (×8): qty 1

## 2014-08-07 MED ORDER — DIPHENHYDRAMINE HCL 25 MG PO CAPS: 25.0000 mg | ORAL_CAPSULE | Freq: Four times a day (QID) | ORAL | Status: DC | PRN

## 2014-08-07 MED ORDER — ONDANSETRON HCL 4 MG/2ML IJ SOLN: 4.0000 mg | INTRAMUSCULAR | Status: DC | PRN

## 2014-08-07 MED ORDER — SIMETHICONE 80 MG PO CHEW: 80.0000 mg | CHEWABLE_TABLET | ORAL | Status: DC | PRN

## 2014-08-07 MED ORDER — WITCH HAZEL-GLYCERIN EX PADS: 1.0000 "application " | MEDICATED_PAD | CUTANEOUS | Status: DC | PRN

## 2014-08-07 MED ORDER — OXYCODONE-ACETAMINOPHEN 5-325 MG PO TABS: 2.0000 | ORAL_TABLET | ORAL | Status: DC | PRN

## 2014-08-07 MED ORDER — PRENATAL MULTIVITAMIN CH
1.0000 | ORAL_TABLET | Freq: Every day | ORAL | Status: DC
  Administered 2014-08-08: 1 via ORAL
  Filled 2014-08-07: qty 1

## 2014-08-07 MED ORDER — SENNOSIDES-DOCUSATE SODIUM 8.6-50 MG PO TABS
2.0000 | ORAL_TABLET | ORAL | Status: DC
  Administered 2014-08-07 – 2014-08-08 (×2): 2 via ORAL
  Filled 2014-08-07 (×2): qty 2

## 2014-08-07 NOTE — Progress Notes (Signed)
Bethany Hanna is a 34 y.o. G1P0 at [redacted]w[redacted]d SROM 12.30 pm 6/2, protracted labor and progress with OT position, IUPC note 250MVu with pitocin at 4 mu rate and adequate. Epidural working well VS stable  BP 123/77 mmHg  Pulse 103  Temp(Src) 98.8 F (37.1 C) (Axillary)  Resp 18  Ht 5\' 8"  (1.727 m)  Wt 210 lb (95.255 kg)  BMI 31.94 kg/m2  LMP 07/17/2013  FHT:  FHR: 150 bpm, variability: minimal ,  accelerations:  Present,  decelerations:  Present intermittent variable decels reduced with turning patient, plan amnio-infusion if needed  UC:   regular, every 2-3 minutes, adequate MVu SVE:   Dilation: 9 Effacement (%): 100 Station: 0, -1 Exam by:: Dr. Benjie Karvonen Head well applied to cervix and turning OA, good decent from last exam. Reassuring   Assessment / Plan: Protracted active phase but made good progress since last 2 hrs, FHT cat- II has resolved back to cat-I now.   Labor: Improved exam, continue pitocin, add amnioinfusion if recurrent variable decels return Preeclampsia:  none Fetal Wellbeing:  Category I Pain Control:  Epidural I/D:  n/a Anticipated MOD:  NSVD  Bethany Hanna R 08/07/2014, 8:29 AM

## 2014-08-07 NOTE — Progress Notes (Signed)
Bethany Hanna is a 33 y.o. G1P0 at [redacted]w[redacted]d admitted with SROM since 12.30 pm yesterday, clear fluid. Was admitted at 3-4 cm, s/p epidural last night, good pain relief per pt.  BP 124/68 mmHg  Pulse 110  Temp(Src) 98.5 F (36.9 C) (Axillary)  Resp 20  Ht 5\' 8"  (1.727 m)  Wt 210 lb (95.255 kg)  BMI 31.94 kg/m2  LMP 07/17/2013  VS stable, BPs stable, no s/s of preeclampsia. GBS(-), no maternal fever/ maternal or fetal tachycardia. UCs spaced out intermittently overnight but every time pitocin was planned they'd pick back up so she was not started on pitocin per RN. This am, UCs have spaced out and she is on pitocin, IUPC placed. FHT:  FHR: 145-150 bpm, variability: moderate,  accelerations:  Present,  decelerations:  Present intermittent variable decels with contraction, resolve in less than 1 minute to baseline.  UC:   regular, every 3-4 minutes, RN advised again to increase pitocin SVE:  7-8/ 100% but loosely applied to head and stretchy, LOT/ -2. Top of the head feels lower but occiput higher, is in LOT position and exaggerated Sims with peanut ball placed to turn right.   Assessment / Plan: Protracted active phase, RN needs to cont pitocin for adequate labor to assess progress. Concerned due to high station and LOT position, EFW 7.1/2 lbs. Assess labor for arrest/progress in 1-2 hrs and assess rotation and decent. Patient counseled, increased C/s risk but ok to wait and assess, especially due to no maternal/fetal concerns. FHT- category I  Anoop Hemmer R 08/07/2014, 6:54 AM

## 2014-08-07 NOTE — Progress Notes (Signed)
Dr. Benjie Karvonen notified of FHR tracing with variable decels, minimal variablity, increase in baseline, MVUs, amt of pit, interventions provided. Dr. Benjie Karvonen assessing pt

## 2014-08-07 NOTE — Progress Notes (Signed)
Delivery of live female by Micheline Rough, APGAR 7,9 Erin Hampton-RRRN OB at bedside

## 2014-08-07 NOTE — Lactation Note (Signed)
This note was copied from the chart of [place]. Lactation Consultation Note  Patient Name: Bethany Hanna Date: 08/07/2014 Reason for consult: Initial assessment Baby asleep STS on Mom. Mom reports baby latched to left breast earlier this afternoon and she felt BF well. LC encouraged Mom to BF with feeding ques, basic teaching reviewed with Mom. Lactation brochure left for review, advised of OP services and support group. Encouraged Mom to call for assist as needed with latch or for questions/concerns.   Maternal Data Has patient been taught Hand Expression?: Yes Does the patient have breastfeeding experience prior to this delivery?: No  Feeding Feeding Type: Breast Fed Length of feed: 0 min  LATCH Score/Interventions Latch: Repeated attempts needed to sustain latch, nipple held in mouth throughout feeding, stimulation needed to elicit sucking reflex. Intervention(s): Adjust position;Assist with latch;Breast compression;Breast massage  Audible Swallowing: None Intervention(s): Hand expression;Skin to skin (mom able to hand express)  Type of Nipple: Flat (semi flat) Intervention(s): No intervention needed;Reverse pressure  Comfort (Breast/Nipple): Soft / non-tender     Hold (Positioning): Assistance needed to correctly position infant at breast and maintain latch.  LATCH Score: 5  Lactation Tools Discussed/Used     Consult Status Consult Status: Follow-up Date: 08/08/14 Follow-up type: In-patient    Katrine Coho 08/07/2014, 5:35 PM

## 2014-08-07 NOTE — Progress Notes (Signed)
Subjective:   Requested by Dr. Benjie Karvonen to attend birth-MD in OR. Active pushing in lithotomy.  Objective:   VS: Blood pressure 129/84, pulse 114, temperature 99 F (37.2 C), temperature source Axillary, resp. rate 18, height 5\' 8"  (1.727 m), weight 95.255 kg (210 lb), last menstrual period 07/17/2013. FHR: baseline 160 / variability mod / accelerations no / variable decelerations Toco: contractions every 2-3 minutes Cervix: Dilation: 10 Effacement (%): 100 Station: +2 Exam by:: Lauren McDaniel-RN, Chelsea Foxx-RN Membranes: clear Pitocin: 4 mu/min  Assessment:  Labor: second stage FHR category II  Plan:  Good maternal efforts, change in FHR baseline-afebrile-no evidence of infection, variables-likely cord compression-left lateral then right lateral pushing, discontinue Pitocin, apply maternal o2. SVD imminent.    Bethany Hanna, N MSN, CNM 08/07/2014, 11:36 AM

## 2014-08-08 DIAGNOSIS — D72829 Elevated white blood cell count, unspecified: Secondary | ICD-10-CM | POA: Diagnosis not present

## 2014-08-08 DIAGNOSIS — D62 Acute posthemorrhagic anemia: Secondary | ICD-10-CM | POA: Diagnosis not present

## 2014-08-08 LAB — CBC
HCT: 28.6 % — ABNORMAL LOW (ref 36.0–46.0)
Hemoglobin: 9.5 g/dL — ABNORMAL LOW (ref 12.0–15.0)
MCH: 28.4 pg (ref 26.0–34.0)
MCHC: 33.2 g/dL (ref 30.0–36.0)
MCV: 85.4 fL (ref 78.0–100.0)
Platelets: 197 10*3/uL (ref 150–400)
RBC: 3.35 MIL/uL — ABNORMAL LOW (ref 3.87–5.11)
RDW: 14.7 % (ref 11.5–15.5)
WBC: 23.5 10*3/uL — ABNORMAL HIGH (ref 4.0–10.5)

## 2014-08-08 MED ORDER — MAGNESIUM OXIDE 400 (241.3 MG) MG PO TABS
200.0000 mg | ORAL_TABLET | Freq: Every day | ORAL | Status: DC
  Administered 2014-08-08 – 2014-08-09 (×2): 200 mg via ORAL
  Filled 2014-08-08 (×2): qty 0.5

## 2014-08-08 MED ORDER — POLYSACCHARIDE IRON COMPLEX 150 MG PO CAPS: 150.0000 mg | ORAL_CAPSULE | Freq: Two times a day (BID) | ORAL | Status: DC

## 2014-08-08 MED ORDER — POLYSACCHARIDE IRON COMPLEX 150 MG PO CAPS
150.0000 mg | ORAL_CAPSULE | Freq: Every day | ORAL | Status: DC
  Administered 2014-08-08 – 2014-08-09 (×2): 150 mg via ORAL
  Filled 2014-08-08 (×2): qty 1

## 2014-08-08 NOTE — Progress Notes (Signed)
PPD #1- SVD  Subjective:   Reports feeling well, perineum sore Tolerating po/ No nausea or vomiting Bleeding is light Pain controlled with Motrin  Up ad lib / ambulatory / voiding without problems Newborn: breastfeeding  / Circumcision: planning   Objective:   VS:  VS:  Filed Vitals:   08/07/14 1500 08/07/14 1831 08/07/14 2026 08/08/14 0622  BP: 115/72 118/72 99/60 118/67  Pulse: 99 108 104 78  Temp: 98.8 F (37.1 C) 98.5 F (36.9 C) 98 F (36.7 C) 98 F (36.7 C)  TempSrc: Oral Oral Oral Oral  Resp: 18 18 18 18   Height:      Weight:      SpO2: 100% 100%  99%    LABS:  Recent Labs  08/06/14 1350 08/08/14 0545  WBC 12.4* 23.5*  HGB 13.4 9.5*  PLT 307 197   Blood type: --/--/A POS, A POS (06/02 1350) Rubella: Immune (10/27 0000)   I&O: Intake/Output      06/03 0701 - 06/04 0700 06/04 0701 - 06/05 0700   Urine (mL/kg/hr) 150 (0.1)    Blood 436 (0.2)    Total Output 586     Net -586          Urine Occurrence 1 x      Physical Exam: Alert and oriented x3 Abdomen: soft, non-tender, non-distended  Fundus: firm, non-tender, U-2 Perineum: Well approximated, no significant erythema, edema, or drainage; healing well. Small hemorrhoid x1. Lochia: small Extremities: no edema, no calf pain or tenderness    Assessment:  PPD #1 G1P1001/ S/P:spontaneous vaginal, 2nd degree laceration, rt labial laceration ABL anemia Leukocytosis-no evidence of acute infection  Doing well    Plan: Repeat CBC in am Start Niferex and Mag Oxide Continue routine post partum orders Anticipate D/C home tomorrow   Julianne Handler, N MSN, CNM 08/08/2014, 11:31 AM

## 2014-08-08 NOTE — Anesthesia Postprocedure Evaluation (Signed)
  Anesthesia Post-op Note  Patient: Bethany Hanna  Procedure(s) Performed: * No procedures listed *  Patient Location: Mother/Baby  Anesthesia Type:Epidural  Level of Consciousness: awake  Airway and Oxygen Therapy: Patient Spontanous Breathing  Post-op Pain: mild  Post-op Assessment: Post-op Vital signs reviewed, Patient's Cardiovascular Status Stable, Respiratory Function Stable, No signs of Nausea or vomiting, Pain level controlled, No headache, No residual numbness and No residual motor weakness  Post-op Vital Signs: Reviewed  Last Vitals:  Filed Vitals:   08/08/14 0622  BP: 118/67  Pulse: 78  Temp: 36.7 C  Resp: 18    Complications: No apparent anesthesia complications

## 2014-08-09 LAB — CBC
HCT: 27.4 % — ABNORMAL LOW (ref 36.0–46.0)
Hemoglobin: 9 g/dL — ABNORMAL LOW (ref 12.0–15.0)
MCH: 28.2 pg (ref 26.0–34.0)
MCHC: 32.8 g/dL (ref 30.0–36.0)
MCV: 85.9 fL (ref 78.0–100.0)
Platelets: 225 10*3/uL (ref 150–400)
RBC: 3.19 MIL/uL — ABNORMAL LOW (ref 3.87–5.11)
RDW: 14.7 % (ref 11.5–15.5)
WBC: 16.8 10*3/uL — AB (ref 4.0–10.5)

## 2014-08-09 MED ORDER — POLYSACCHARIDE IRON COMPLEX 150 MG PO CAPS: 150.0000 mg | ORAL_CAPSULE | Freq: Every day | ORAL | Status: DC

## 2014-08-09 MED ORDER — MAGNESIUM OXIDE 400 (241.3 MG) MG PO TABS: 200.0000 mg | ORAL_TABLET | Freq: Every day | ORAL | Status: DC

## 2014-08-09 MED ORDER — OXYCODONE-ACETAMINOPHEN 5-325 MG PO TABS: 1.0000 | ORAL_TABLET | ORAL | Status: DC | PRN

## 2014-08-09 MED ORDER — IBUPROFEN 600 MG PO TABS: 600.0000 mg | ORAL_TABLET | Freq: Four times a day (QID) | ORAL | Status: DC

## 2014-08-09 NOTE — Lactation Note (Signed)
This note was copied from the chart of [place]. Lactation Consultation Note  Baby has not been BF well per mom.  Parents describe tongue thrusting.  NS is not working well.  Explained laid back BF to parents to see if that encourages a better latch.  Mom is very tired and is considering pumping / bottle feeding or formula feeding.  Encouraged milk production related to health benefits for mom and baby.  #21 flange given to mom.  Encouraged support group and outpatient services as needed. Patient Name: Bethany Hanna [id number] Date: 08/09/2014     Maternal Data    Feeding    LATCH Score/Interventions                      Lactation Tools Discussed/Used     Consult Status      Van Clines 08/09/2014, 10:06 AM

## 2014-08-09 NOTE — Discharge Summary (Signed)
Obstetric Discharge Summary Reason for Admission: rupture of membranes and 39.[redacted] weeks gestation Prenatal Procedures: IUI w/Clomid, GERD Intrapartum Procedures: spontaneous vaginal delivery and Pitocin augmentation, epidural, terminal abruption Postpartum Procedures: none Complications-Operative and Postpartum: 2nd degree perineal laceration and right labial laceration HEMOGLOBIN  Date Value Ref Range Status  08/09/2014 9.0* 12.0 - 15.0 g/dL Final  01/19/2012 13.2 12.2 - 16.2 g/dL Final   HCT  Date Value Ref Range Status  08/09/2014 27.4* 36.0 - 46.0 % Final   HCT, POC  Date Value Ref Range Status  01/19/2012 42.7 37.7 - 47.9 % Final    Physical Exam:  General: alert and cooperative Lochia: appropriate Uterine Fundus: firm Incision: healing well, no significant drainage, no dehiscence, no significant erythema DVT Evaluation: No evidence of DVT seen on physical exam. Negative Homan's sign. No cords or calf tenderness. No significant calf/ankle edema.  Discharge Diagnoses: Term Pregnancy-delivered, ABL anemia  Discharge Information: Date: 08/09/2014 Activity: pelvic rest Diet: routine Medications: PNV, Ibuprofen, Iron and Percocet Condition: stable Instructions: refer to practice specific booklet Discharge to: home Follow-up Information    Follow up with LAVOIE,MARIE-LYNE, MD. Schedule an appointment as soon as possible for a visit in 6 weeks.   Specialty:  Obstetrics and Gynecology   Contact information:   [place] [address] [phone]       Newborn Data: Live born female on 08/07/14 Birth Weight: 8 lb 11 oz (3941 g) APGAR: 7, 9  Home with mother.  Neizan Debruhl, [place], N 08/09/2014, 10:32 AM

## 2014-08-09 NOTE — Progress Notes (Signed)
PPD #2- SVD  Subjective:   Reports feeling well, ready for discharge Tolerating po/ No nausea or vomiting Bleeding is light Pain controlled with Motrin and Percocet Up ad lib / ambulatory / voiding without problems Newborn: breastfeeding  / Circumcision: done   Objective:   VS: VS:  Filed Vitals:   08/07/14 2026 08/08/14 0622 08/08/14 1800 08/09/14 0610  BP: 99/60 118/67 119/71 108/72  Pulse: 104 78 102 80  Temp: 98 F (36.7 C) 98 F (36.7 C) 97.9 F (36.6 C) 97.7 F (36.5 C)  TempSrc: Oral Oral Oral Oral  Resp: 18 18 18 18   Height:      Weight:      SpO2:  99%      LABS:  Recent Labs  08/08/14 0545 08/09/14 0643  WBC 23.5* 16.8*  HGB 9.5* 9.0*  PLT 197 225   Blood type: --/--/A POS, A POS (06/02 1350) Rubella: Immune (10/27 0000)                I&O: Intake/Output      06/04 0701 - 06/05 0700 06/05 0701 - 06/06 0700   Urine (mL/kg/hr)     Blood     Total Output       Net              Physical Exam: Alert and oriented X3 Abdomen: soft, non-tender, non-distended  Fundus: firm, non-tender, U-2 Perineum: Well approximated, no significant erythema, edema, or drainage; healing well. Lochia: small Extremities: No edema, no calf pain or tenderness    Assessment: PPD #2  G1P1001/ S/P:spontaneous vaginal, 2nd degree laceration and labial laceration ABL anemia Leukocytosis-resolving, no evidence of acute infection  Doing well - stable for discharge home   Plan: Discharge home RX's:  Ibuprofen 600mg  po Q 6 hrs prn pain #30 Refill x 0 Niferex 150mg  po QD #30 Refill x 1 Percocet 5/325 1 to 2 po Q 4 hrs prn pain #30 Refill x 0 Routine pp visit in 6wks at [facility] given    Julianne Handler, N MSN, CNM 08/09/2014, 10:29 AM

## 2015-03-07 NOTE — L&D Delivery Note (Signed)
Delivery Note At 2:41 PM a viable and healthy female was delivered via Vaginal, Spontaneous Delivery (Presentation:vtx  ;OA  ).  APGAR: 9, 9; weight pending  .   Placenta status:spontanoeus intact not sent , .  Cord:  with the following complications:none .  Cord [id number]  Anesthesia:  epidural Episiotomy: None Lacerations: 2nd degree;Perineal Suture Repair: 3.0 chromic Est. Blood Loss (mL): 250  Mom to postpartum.  Baby to Couplet care / Skin to Skin.  Prajwal Fellner A 03/01/2016, 3:03 PM

## 2015-04-08 ENCOUNTER — Emergency Department (HOSPITAL_COMMUNITY): Payer: Managed Care, Other (non HMO)

## 2015-04-08 ENCOUNTER — Encounter (HOSPITAL_COMMUNITY): Payer: Self-pay | Admitting: Emergency Medicine

## 2015-04-08 ENCOUNTER — Observation Stay (HOSPITAL_COMMUNITY)
Admission: EM | Admit: 2015-04-08 | Discharge: 2015-04-10 | Disposition: A | Discharge status: Home / Self Care | Payer: Managed Care, Other (non HMO) | Attending: General Surgery | Admitting: General Surgery

## 2015-04-08 DIAGNOSIS — E669 Obesity, unspecified: Secondary | ICD-10-CM | POA: Diagnosis not present

## 2015-04-08 DIAGNOSIS — R109 Unspecified abdominal pain: Secondary | ICD-10-CM | POA: Diagnosis present

## 2015-04-08 DIAGNOSIS — R1011 Right upper quadrant pain: Secondary | ICD-10-CM

## 2015-04-08 DIAGNOSIS — Z79899 Other long term (current) drug therapy: Secondary | ICD-10-CM | POA: Insufficient documentation

## 2015-04-08 DIAGNOSIS — Z6831 Body mass index (BMI) 31.0-31.9, adult: Secondary | ICD-10-CM | POA: Insufficient documentation

## 2015-04-08 DIAGNOSIS — K819 Cholecystitis, unspecified: Secondary | ICD-10-CM

## 2015-04-08 DIAGNOSIS — Z791 Long term (current) use of non-steroidal anti-inflammatories (NSAID): Secondary | ICD-10-CM | POA: Diagnosis not present

## 2015-04-08 DIAGNOSIS — K8000 Calculus of gallbladder with acute cholecystitis without obstruction: Secondary | ICD-10-CM | POA: Diagnosis not present

## 2015-04-08 DIAGNOSIS — Z419 Encounter for procedure for purposes other than remedying health state, unspecified: Secondary | ICD-10-CM

## 2015-04-08 LAB — COMPREHENSIVE METABOLIC PANEL
ALBUMIN: 4.4 g/dL (ref 3.5–5.0)
ALT: 123 U/L — ABNORMAL HIGH (ref 14–54)
AST: 217 U/L — AB (ref 15–41)
Alkaline Phosphatase: 114 U/L (ref 38–126)
Anion gap: 11 (ref 5–15)
BILIRUBIN TOTAL: 0.9 mg/dL (ref 0.3–1.2)
BUN: 12 mg/dL (ref 6–20)
CALCIUM: 9 mg/dL (ref 8.9–10.3)
CO2: 20 mmol/L — ABNORMAL LOW (ref 22–32)
Chloride: 105 mmol/L (ref 101–111)
Creatinine, Ser: 0.63 mg/dL (ref 0.44–1.00)
GFR calc Af Amer: >60 mL/min (ref >60)
GFR calc non Af Amer: >60 mL/min (ref >60)
GLUCOSE: 110 mg/dL — AB (ref 65–99)
Potassium: 3.8 mmol/L (ref 3.5–5.1)
Sodium: 136 mmol/L (ref 135–145)
TOTAL PROTEIN: 7.9 g/dL (ref 6.5–8.1)

## 2015-04-08 LAB — CBC WITH DIFFERENTIAL/PLATELET
BASOS PCT: 0 %
Basophils Absolute: 0 10*3/uL (ref 0.0–0.1)
Eosinophils Absolute: 0 10*3/uL (ref 0.0–0.7)
Eosinophils Relative: 0 %
HCT: 40.1 % (ref 36.0–46.0)
HEMOGLOBIN: 13.3 g/dL (ref 12.0–15.0)
LYMPHS PCT: 11 %
Lymphs Abs: 1.8 10*3/uL (ref 0.7–4.0)
MCH: 29.4 pg (ref 26.0–34.0)
MCHC: 33.2 g/dL (ref 30.0–36.0)
MCV: 88.7 fL (ref 78.0–100.0)
MONOS PCT: 6 %
Monocytes Absolute: 1 10*3/uL (ref 0.1–1.0)
NEUTROS PCT: 83 %
Neutro Abs: 13.5 10*3/uL — ABNORMAL HIGH (ref 1.7–7.7)
Platelets: 397 10*3/uL (ref 150–400)
RBC: 4.52 MIL/uL (ref 3.87–5.11)
RDW: 12.2 % (ref 11.5–15.5)
WBC: 16.4 10*3/uL — ABNORMAL HIGH (ref 4.0–10.5)

## 2015-04-08 LAB — LIPASE, BLOOD: Lipase: 41 U/L (ref 11–51)

## 2015-04-08 MED ORDER — MORPHINE SULFATE (PF) 4 MG/ML IV SOLN
4.0000 mg | Freq: Once | INTRAVENOUS | Status: AC
  Administered 2015-04-08: 4 mg via INTRAVENOUS
  Filled 2015-04-08: qty 1

## 2015-04-08 MED ORDER — CEFTRIAXONE SODIUM 2 G IJ SOLR
2.0000 g | Freq: Once | INTRAMUSCULAR | Status: AC
  Administered 2015-04-09: 2 g via INTRAVENOUS
  Filled 2015-04-08: qty 2

## 2015-04-08 MED ORDER — SODIUM CHLORIDE 0.9 % IV BOLUS (SEPSIS)
1000.0000 mL | Freq: Once | INTRAVENOUS | Status: AC
  Administered 2015-04-08: 1000 mL via INTRAVENOUS

## 2015-04-08 MED ORDER — ONDANSETRON HCL 4 MG/2ML IJ SOLN
4.0000 mg | Freq: Once | INTRAMUSCULAR | Status: AC
  Administered 2015-04-08: 4 mg via INTRAVENOUS
  Filled 2015-04-08: qty 2

## 2015-04-08 NOTE — ED Notes (Signed)
Pt states she had a baby 51mths ago and since then she has had 4 episodes where she has pain in her right upper quadrant that radiates around to her back  Pt states she thinks she may have gallbladder issues  Pt states today around 430 she started having upper abd pain that wraps around to her back   Pt states she has had vomiting since 430 as well  Last vomited about 45 minutes ago

## 2015-04-08 NOTE — ED Notes (Signed)
Asked pt for urine she said she would let us know when she needed to go

## 2015-04-08 NOTE — ED Provider Notes (Signed)
CSN: [id number]     Arrival date & time 04/08/15  2144 History   First MD Initiated Contact with Patient 04/08/15 2203     Chief Complaint  Patient presents with  . Abdominal Pain     (Consider location/radiation/quality/duration/timing/severity/associated sxs/prior Treatment) HPI Comments: Patient with right upper quadrant pain and epigastric pain onset around 4 PM. Pain is constant and radiates to her mid back. Associated with nausea and vomiting today times about 6. No diarrhea. No fever. No chest pain or shortness of breath. Patient states she delivered a baby about 8 months ago and had several episodes of this pain during her pregnancy as well as about 4 episodes this pain after her pregnancy. This is the worst it's been. She is worried about her gallbladder. Denies any fever, urinary or vaginal symptoms. She is not on any birth control.  Patient is a 35 y.o. female presenting with abdominal pain. The history is provided by the patient.  Abdominal Pain Associated symptoms: nausea and vomiting   Associated symptoms: no constipation, no diarrhea, no dysuria, no fever, no hematuria, no shortness of breath, no vaginal bleeding and no vaginal discharge     History reviewed. No pertinent past medical history. Past Surgical History  Procedure Laterality Date  . Dilatation & currettage/hysteroscopy with resectocope N/A 07/31/2013    Procedure: DILATATION & CURETTAGE/HYSTEROSCOPY WITH RESECTOCOPE;  Surgeon: Princess Bruins, MD;  Location: [facility];  Service: Gynecology;  Laterality: N/A;  1 hr.   Family History  Problem Relation Age of Onset  . Hypertension Father    Social History  Substance Use Topics  . Smoking status: Never Smoker   . Smokeless tobacco: None  . Alcohol Use: Yes     Comment: social    OB History    Gravida Para Term Preterm AB TAB SAB Ectopic Multiple Living   1 1 1       0 1     Review of Systems  Constitutional: Negative for fever, activity change and appetite  change.  HENT: Negative for congestion and rhinorrhea.   Respiratory: Negative for chest tightness and shortness of breath.   Gastrointestinal: Positive for nausea, vomiting and abdominal pain. Negative for diarrhea and constipation.  Genitourinary: Negative for dysuria, hematuria, vaginal bleeding and vaginal discharge.  Musculoskeletal: Negative for myalgias and arthralgias.  Skin: Negative for wound.  Neurological: Negative for dizziness, facial asymmetry, weakness and headaches.  A complete 10 system review of systems was obtained and all systems are negative except as noted in the HPI and PMH.      Allergies  Review of patient's allergies indicates no known allergies.  Home Medications   Prior to Admission medications   Medication Sig Start Date End Date Taking? Authorizing Provider  Ca Carbonate-Mag Hydroxide (ROLAIDS PO) Take 1 tablet by mouth as needed (heartburn).   Yes Historical Provider, MD  ibuprofen (ADVIL,MOTRIN) 200 MG tablet Take 200 mg by mouth every 6 (six) hours as needed for moderate pain.   Yes Historical Provider, MD  Prenatal Vit-Fe Fumarate-FA (PRENATAL MULTIVITAMIN) TABS Take 1 tablet by mouth daily.   Yes Historical Provider, MD  ranitidine (ZANTAC) 75 MG tablet Take 75 mg by mouth at bedtime.   Yes Historical Provider, MD  ibuprofen (ADVIL,MOTRIN) 600 MG tablet Take 1 tablet (600 mg total) by mouth every 6 (six) hours. Patient not taking: Reported on 04/08/2015 08/09/14   Graciela Husbands, CNM  iron polysaccharides (NIFEREX) 150 MG capsule Take 1 capsule (150 mg total) by mouth  daily. Patient not taking: Reported on 04/08/2015 08/09/14   Graciela Husbands, CNM  magnesium oxide (MAG-OX) 400 (241.3 MG) MG tablet Take 0.5 tablets (200 mg total) by mouth daily. Patient not taking: Reported on 04/08/2015 08/09/14   Graciela Husbands, CNM  oxyCODONE-acetaminophen (PERCOCET/ROXICET) 5-325 MG per tablet Take 1-2 tablets by mouth every 4 (four) hours as needed (for pain scale  equal to or greater than 7). Patient not taking: Reported on 04/08/2015 08/09/14   Graciela Husbands, CNM   BP 133/91 mmHg  Pulse 81  Temp(Src) 98.7 F (37.1 C) (Oral)  Resp 20  SpO2 98%  LMP 02/16/2015 (Exact Date) Physical Exam  Constitutional: She is oriented to person, place, and time. She appears well-developed and well-nourished. No distress.  HENT:  Head: Normocephalic and atraumatic.  Mouth/Throat: Oropharynx is clear and moist. No oropharyngeal exudate.  Eyes: Conjunctivae and EOM are normal. Pupils are equal, round, and reactive to light.  Neck: Normal range of motion. Neck supple.  No meningismus.  Cardiovascular: Normal rate, regular rhythm, normal heart sounds and intact distal pulses.   No murmur heard. Pulmonary/Chest: Effort normal and breath sounds normal. No respiratory distress.  Abdominal: Soft. There is tenderness. There is guarding. There is no rebound.  TTP epigastrum and RUQ with guarding  Musculoskeletal: Normal range of motion. She exhibits no edema or tenderness.  Neurological: She is alert and oriented to person, place, and time. No cranial nerve deficit. She exhibits normal muscle tone. Coordination normal.  No ataxia on finger to nose bilaterally. No pronator drift. 5/5 strength throughout. CN 2-12 intact.Equal grip strength. Sensation intact.   Skin: Skin is warm.  Psychiatric: She has a normal mood and affect. Her behavior is normal.  Nursing note and vitals reviewed.   ED Course  Procedures (including critical care time) Labs Review Labs Reviewed  CBC WITH DIFFERENTIAL/PLATELET - Abnormal; Notable for the following:    WBC 16.4 (*)    Neutro Abs 13.5 (*)    All other components within normal limits  COMPREHENSIVE METABOLIC PANEL - Abnormal; Notable for the following:    CO2 20 (*)    Glucose, Bld 110 (*)    AST 217 (*)    ALT 123 (*)    All other components within normal limits  LIPASE, BLOOD  URINALYSIS, ROUTINE W REFLEX MICROSCOPIC (NOT AT  [facility])  PREGNANCY, URINE    Imaging Review US Abdomen Limited Ruq  04/08/2015  CLINICAL DATA:  RIGHT upper quadrant pain with nausea and vomiting beginning today. EXAM: US ABDOMEN LIMITED - RIGHT UPPER QUADRANT COMPARISON:  None. FINDINGS: Gallbladder: Multiple tiny dependent echogenic gallstones and layering gallbladder sludge. Mild gallbladder distension. Mild gallbladder wall thickening at 3 mm. Small amount of pericholecystic fluid. Sonographic Murphy's sign elicited. Common bile duct: Diameter: 2 mm Liver: No focal lesion identified. Within normal limits in parenchymal echogenicity. Hepatopetal portal vein. IMPRESSION: Cholelithiasis and acute cholecystitis. Electronically Signed   By: Elon Alas M.D.   On: 04/08/2015 23:30   I have personally reviewed and evaluated these images and lab results as part of my medical decision-making.   EKG Interpretation None      MDM   Final diagnoses:  RUQ pain  Cholecystitis   right upper quadrant pain with nausea and vomiting. Recurrent episodes of same. Concern for gallbladder pathology.  Labs show leukocytosis of 16. Mild transaminitis. Lipase normal.  [place] with stones and signs of cholecystitis.  IV rocephin given.  D.w Dr. Zella Richer who will evaluate.  Ezequiel Essex, MD 04/09/15 0030

## 2015-04-09 ENCOUNTER — Encounter (HOSPITAL_COMMUNITY)
Admission: EM | Disposition: A | Discharge status: Home / Self Care | Payer: Self-pay | Source: Home / Self Care | Attending: Emergency Medicine

## 2015-04-09 ENCOUNTER — Encounter (HOSPITAL_COMMUNITY): Payer: Self-pay | Admitting: Anesthesiology

## 2015-04-09 ENCOUNTER — Observation Stay (HOSPITAL_COMMUNITY): Payer: Managed Care, Other (non HMO)

## 2015-04-09 ENCOUNTER — Observation Stay (HOSPITAL_COMMUNITY): Payer: Managed Care, Other (non HMO) | Admitting: Anesthesiology

## 2015-04-09 DIAGNOSIS — K8000 Calculus of gallbladder with acute cholecystitis without obstruction: Secondary | ICD-10-CM | POA: Diagnosis present

## 2015-04-09 HISTORY — PX: CHOLECYSTECTOMY: SHX55

## 2015-04-09 LAB — CBC
HEMATOCRIT: 35.8 % — AB (ref 36.0–46.0)
Hemoglobin: 11.9 g/dL — ABNORMAL LOW (ref 12.0–15.0)
MCH: 29.7 pg (ref 26.0–34.0)
MCHC: 33.2 g/dL (ref 30.0–36.0)
MCV: 89.3 fL (ref 78.0–100.0)
Platelets: 360 10*3/uL (ref 150–400)
RBC: 4.01 MIL/uL (ref 3.87–5.11)
RDW: 12.5 % (ref 11.5–15.5)
WBC: 9.2 10*3/uL (ref 4.0–10.5)

## 2015-04-09 LAB — COMPREHENSIVE METABOLIC PANEL
ALT: 106 U/L — ABNORMAL HIGH (ref 14–54)
ANION GAP: 7 (ref 5–15)
AST: 93 U/L — ABNORMAL HIGH (ref 15–41)
Albumin: 3.7 g/dL (ref 3.5–5.0)
Alkaline Phosphatase: 101 U/L (ref 38–126)
BILIRUBIN TOTAL: 0.7 mg/dL (ref 0.3–1.2)
BUN: 7 mg/dL (ref 6–20)
CALCIUM: 8.8 mg/dL — AB (ref 8.9–10.3)
CO2: 24 mmol/L (ref 22–32)
Chloride: 109 mmol/L (ref 101–111)
Creatinine, Ser: 0.53 mg/dL (ref 0.44–1.00)
GFR calc Af Amer: >60 mL/min (ref >60)
Glucose, Bld: 111 mg/dL — ABNORMAL HIGH (ref 65–99)
POTASSIUM: 4.5 mmol/L (ref 3.5–5.1)
Sodium: 140 mmol/L (ref 135–145)
TOTAL PROTEIN: 6.9 g/dL (ref 6.5–8.1)

## 2015-04-09 LAB — URINALYSIS, ROUTINE W REFLEX MICROSCOPIC
Bilirubin Urine: NEGATIVE
GLUCOSE, UA: NEGATIVE mg/dL
HGB URINE DIPSTICK: NEGATIVE
Ketones, ur: NEGATIVE mg/dL
Leukocytes, UA: NEGATIVE
NITRITE: NEGATIVE
PH: 7.5 (ref 5.0–8.0)
Protein, ur: NEGATIVE mg/dL
SPECIFIC GRAVITY, URINE: 1.009 (ref 1.005–1.030)

## 2015-04-09 LAB — PREGNANCY, URINE: Preg Test, Ur: NEGATIVE

## 2015-04-09 LAB — SURGICAL PCR SCREEN
MRSA, PCR: NEGATIVE
Staphylococcus aureus: NEGATIVE

## 2015-04-09 SURGERY — LAPAROSCOPIC CHOLECYSTECTOMY WITH INTRAOPERATIVE CHOLANGIOGRAM
Anesthesia: General | Site: Abdomen

## 2015-04-09 MED ORDER — FENTANYL CITRATE (PF) 250 MCG/5ML IJ SOLN
INTRAMUSCULAR | Status: AC
Start: 2015-04-09 — End: 2015-04-09
  Filled 2015-04-09: qty 5

## 2015-04-09 MED ORDER — PROPOFOL 10 MG/ML IV BOLUS
INTRAVENOUS | Status: DC | PRN
  Administered 2015-04-09: 180 mg via INTRAVENOUS

## 2015-04-09 MED ORDER — ONDANSETRON HCL 4 MG/2ML IJ SOLN
INTRAMUSCULAR | Status: AC
  Filled 2015-04-09: qty 2

## 2015-04-09 MED ORDER — BUPIVACAINE-EPINEPHRINE (PF) 0.25% -1:200000 IJ SOLN
INTRAMUSCULAR | Status: AC
  Filled 2015-04-09: qty 30

## 2015-04-09 MED ORDER — LIDOCAINE HCL (CARDIAC) 20 MG/ML IV SOLN
INTRAVENOUS | Status: DC | PRN
  Administered 2015-04-09: 50 mg via INTRAVENOUS

## 2015-04-09 MED ORDER — LACTATED RINGERS IR SOLN
Status: DC | PRN
  Administered 2015-04-09: 1
  Administered 2015-04-09: 1000 mL

## 2015-04-09 MED ORDER — KCL IN DEXTROSE-NACL 20-5-0.9 MEQ/L-%-% IV SOLN
INTRAVENOUS | Status: DC
  Administered 2015-04-09: 16:00:00 via INTRAVENOUS
  Administered 2015-04-10: 75 mL/h via INTRAVENOUS
  Filled 2015-04-09 (×2): qty 1000

## 2015-04-09 MED ORDER — LIDOCAINE HCL (CARDIAC) 20 MG/ML IV SOLN
INTRAVENOUS | Status: AC
  Filled 2015-04-09: qty 5

## 2015-04-09 MED ORDER — FENTANYL CITRATE (PF) 100 MCG/2ML IJ SOLN: 25.0000 ug | INTRAMUSCULAR | Status: DC | PRN

## 2015-04-09 MED ORDER — CEFAZOLIN SODIUM-DEXTROSE 2-3 GM-% IV SOLR
INTRAVENOUS | Status: DC | PRN
  Administered 2015-04-09: 2 g via INTRAVENOUS

## 2015-04-09 MED ORDER — NEOSTIGMINE METHYLSULFATE 10 MG/10ML IV SOLN
INTRAVENOUS | Status: AC
  Filled 2015-04-09: qty 1

## 2015-04-09 MED ORDER — LACTATED RINGERS IV SOLN
INTRAVENOUS | Status: DC | PRN
  Administered 2015-04-09: 10:00:00 via INTRAVENOUS

## 2015-04-09 MED ORDER — BUPIVACAINE-EPINEPHRINE 0.25% -1:200000 IJ SOLN
INTRAMUSCULAR | Status: DC | PRN
  Administered 2015-04-09: 21 mL

## 2015-04-09 MED ORDER — FENTANYL CITRATE (PF) 100 MCG/2ML IJ SOLN
INTRAMUSCULAR | Status: DC | PRN
Start: 2015-04-09 — End: 2015-04-09
  Administered 2015-04-09: 100 ug via INTRAVENOUS
  Administered 2015-04-09 (×3): 50 ug via INTRAVENOUS

## 2015-04-09 MED ORDER — KCL IN DEXTROSE-NACL 20-5-0.9 MEQ/L-%-% IV SOLN
INTRAVENOUS | Status: DC
  Administered 2015-04-09: 125 mL/h via INTRAVENOUS
  Filled 2015-04-09 (×3): qty 1000

## 2015-04-09 MED ORDER — DEXTROSE 5 % IV SOLN
2.0000 g | INTRAVENOUS | Status: DC
  Administered 2015-04-09: 2 g via INTRAVENOUS
  Filled 2015-04-09: qty 2

## 2015-04-09 MED ORDER — HYDROMORPHONE HCL 2 MG/ML IJ SOLN
INTRAMUSCULAR | Status: AC
  Filled 2015-04-09: qty 1

## 2015-04-09 MED ORDER — ROCURONIUM BROMIDE 100 MG/10ML IV SOLN
INTRAVENOUS | Status: AC
  Filled 2015-04-09: qty 1

## 2015-04-09 MED ORDER — PROPOFOL 10 MG/ML IV BOLUS
INTRAVENOUS | Status: AC
  Filled 2015-04-09: qty 20

## 2015-04-09 MED ORDER — LACTATED RINGERS IV SOLN
INTRAVENOUS | Status: DC
  Administered 2015-04-09: 13:00:00 via INTRAVENOUS

## 2015-04-09 MED ORDER — ROCURONIUM BROMIDE 100 MG/10ML IV SOLN
INTRAVENOUS | Status: DC | PRN
  Administered 2015-04-09: 10 mg via INTRAVENOUS
  Administered 2015-04-09: 40 mg via INTRAVENOUS

## 2015-04-09 MED ORDER — DEXAMETHASONE SODIUM PHOSPHATE 10 MG/ML IJ SOLN
INTRAMUSCULAR | Status: DC | PRN
  Administered 2015-04-09: 10 mg via INTRAVENOUS

## 2015-04-09 MED ORDER — OXYCODONE-ACETAMINOPHEN 5-325 MG PO TABS
1.0000 | ORAL_TABLET | ORAL | Status: DC | PRN
Start: 2015-04-09 — End: 2015-04-10
  Administered 2015-04-09 (×4): 1 via ORAL
  Administered 2015-04-10 (×2): 2 via ORAL
  Filled 2015-04-09 (×2): qty 1
  Filled 2015-04-09: qty 2
  Filled 2015-04-09: qty 1
  Filled 2015-04-09: qty 2
  Filled 2015-04-09 (×2): qty 1

## 2015-04-09 MED ORDER — ONDANSETRON HCL 4 MG/2ML IJ SOLN
4.0000 mg | INTRAMUSCULAR | Status: DC | PRN
Start: 2015-04-09 — End: 2015-04-10

## 2015-04-09 MED ORDER — GLYCOPYRROLATE 0.2 MG/ML IJ SOLN
INTRAMUSCULAR | Status: DC | PRN
  Administered 2015-04-09: .7 mg via INTRAVENOUS

## 2015-04-09 MED ORDER — NEOSTIGMINE METHYLSULFATE 10 MG/10ML IV SOLN
INTRAVENOUS | Status: DC | PRN
  Administered 2015-04-09: 4 mg via INTRAVENOUS

## 2015-04-09 MED ORDER — ONDANSETRON HCL 4 MG/2ML IJ SOLN
INTRAMUSCULAR | Status: DC | PRN
  Administered 2015-04-09: 4 mg via INTRAVENOUS

## 2015-04-09 MED ORDER — MEPERIDINE HCL 50 MG/ML IJ SOLN: 6.2500 mg | INTRAMUSCULAR | Status: DC | PRN

## 2015-04-09 MED ORDER — IOHEXOL 300 MG/ML  SOLN
INTRAMUSCULAR | Status: DC | PRN
  Administered 2015-04-09: 4 mL

## 2015-04-09 MED ORDER — SODIUM CHLORIDE 0.9 % IJ SOLN
INTRAMUSCULAR | Status: AC
  Filled 2015-04-09: qty 10

## 2015-04-09 MED ORDER — CEFAZOLIN SODIUM-DEXTROSE 2-3 GM-% IV SOLR
INTRAVENOUS | Status: AC
  Filled 2015-04-09: qty 50

## 2015-04-09 MED ORDER — CEFTRIAXONE SODIUM 2 G IJ SOLR: 2.0000 g | INTRAMUSCULAR | Status: DC

## 2015-04-09 MED ORDER — MORPHINE SULFATE (PF) 2 MG/ML IV SOLN
1.0000 mg | INTRAVENOUS | Status: DC | PRN
  Administered 2015-04-09: 4 mg via INTRAVENOUS
  Filled 2015-04-09: qty 2

## 2015-04-09 MED ORDER — DEXAMETHASONE SODIUM PHOSPHATE 10 MG/ML IJ SOLN
INTRAMUSCULAR | Status: AC
  Filled 2015-04-09: qty 1

## 2015-04-09 MED ORDER — PROMETHAZINE HCL 25 MG/ML IJ SOLN
INTRAMUSCULAR | Status: AC
  Filled 2015-04-09: qty 1

## 2015-04-09 MED ORDER — PANTOPRAZOLE SODIUM 40 MG IV SOLR
40.0000 mg | Freq: Every day | INTRAVENOUS | Status: DC
  Administered 2015-04-09: 40 mg via INTRAVENOUS
  Filled 2015-04-09 (×3): qty 40

## 2015-04-09 MED ORDER — MIDAZOLAM HCL 5 MG/5ML IJ SOLN
INTRAMUSCULAR | Status: DC | PRN
  Administered 2015-04-09: 2 mg via INTRAVENOUS

## 2015-04-09 MED ORDER — MORPHINE SULFATE (PF) 2 MG/ML IV SOLN
2.0000 mg | INTRAVENOUS | Status: DC | PRN
  Administered 2015-04-09: 2 mg via INTRAVENOUS
  Administered 2015-04-09: 4 mg via INTRAVENOUS
  Administered 2015-04-09: 2 mg via INTRAVENOUS
  Administered 2015-04-09: 6 mg via INTRAVENOUS
  Filled 2015-04-09 (×2): qty 1
  Filled 2015-04-09: qty 2
  Filled 2015-04-09: qty 3

## 2015-04-09 MED ORDER — GLYCOPYRROLATE 0.2 MG/ML IJ SOLN
INTRAMUSCULAR | Status: AC
  Filled 2015-04-09: qty 3

## 2015-04-09 MED ORDER — PROMETHAZINE HCL 25 MG/ML IJ SOLN
6.2500 mg | INTRAMUSCULAR | Status: DC | PRN
  Administered 2015-04-09: 6.25 mg via INTRAVENOUS

## 2015-04-09 MED ORDER — ONDANSETRON 4 MG PO TBDP: 4.0000 mg | ORAL_TABLET | Freq: Four times a day (QID) | ORAL | Status: DC | PRN

## 2015-04-09 MED ORDER — MIDAZOLAM HCL 2 MG/2ML IJ SOLN
INTRAMUSCULAR | Status: AC
  Filled 2015-04-09: qty 2

## 2015-04-09 MED ORDER — HYDROMORPHONE HCL 1 MG/ML IJ SOLN
INTRAMUSCULAR | Status: DC | PRN
  Administered 2015-04-09: 1 mg via INTRAVENOUS
  Administered 2015-04-09: 0.5 mg via INTRAVENOUS
  Administered 2015-04-09: 1 mg via INTRAVENOUS
  Administered 2015-04-09 (×3): 0.5 mg via INTRAVENOUS

## 2015-04-09 SURGICAL SUPPLY — 30 items
APPLIER CLIP 5 13 M/L LIGAMAX5 (MISCELLANEOUS) ×2
APR CLP MED LRG 5 ANG JAW (MISCELLANEOUS) ×1
BAG SPEC RTRVL LRG 6X4 10 (ENDOMECHANICALS)
CABLE HIGH FREQUENCY MONO STRZ (ELECTRODE) ×1 IMPLANT
CATH REDDICK CHOLANGI 4FR 50CM (CATHETERS) ×2 IMPLANT
CHLORAPREP W/TINT 26ML (MISCELLANEOUS) ×2 IMPLANT
CLIP APPLIE 5 13 M/L LIGAMAX5 (MISCELLANEOUS) ×1 IMPLANT
COVER MAYO STAND STRL (DRAPES) ×2 IMPLANT
COVER SURGICAL LIGHT HANDLE (MISCELLANEOUS) ×2 IMPLANT
DECANTER SPIKE VIAL GLASS SM (MISCELLANEOUS) ×2 IMPLANT
DRAPE C-ARM 42X120 X-RAY (DRAPES) ×2 IMPLANT
DRAPE LAPAROSCOPIC ABDOMINAL (DRAPES) ×2 IMPLANT
ELECT REM PT RETURN 9FT ADLT (ELECTROSURGICAL) ×2
ELECTRODE REM PT RTRN 9FT ADLT (ELECTROSURGICAL) ×1 IMPLANT
GLOVE BIO SURGEON STRL SZ7.5 (GLOVE) ×2 IMPLANT
GOWN STRL REUS W/TWL XL LVL3 (GOWN DISPOSABLE) ×6 IMPLANT
HEMOSTAT SURGICEL 4X8 (HEMOSTASIS) IMPLANT
IV CATH 14GX2 1/4 (CATHETERS) ×2 IMPLANT
KIT BASIN OR (CUSTOM PROCEDURE TRAY) ×2 IMPLANT
LIQUID BAND (GAUZE/BANDAGES/DRESSINGS) ×2 IMPLANT
POUCH SPECIMEN RETRIEVAL 10MM (ENDOMECHANICALS) IMPLANT
SCISSORS LAP 5X35 DISP (ENDOMECHANICALS) ×1 IMPLANT
SET IRRIG TUBING LAPAROSCOPIC (IRRIGATION / IRRIGATOR) ×2 IMPLANT
SLEEVE XCEL OPT CAN 5 100 (ENDOMECHANICALS) ×4 IMPLANT
SUT MNCRL AB 4-0 PS2 18 (SUTURE) ×3 IMPLANT
TOWEL OR 17X26 10 PK STRL BLUE (TOWEL DISPOSABLE) ×2 IMPLANT
TOWEL OR NON WOVEN STRL DISP B (DISPOSABLE) ×2 IMPLANT
TRAY LAPAROSCOPIC (CUSTOM PROCEDURE TRAY) ×2 IMPLANT
TROCAR BLADELESS OPT 5 100 (ENDOMECHANICALS) ×2 IMPLANT
TROCAR XCEL BLUNT TIP 100MML (ENDOMECHANICALS) ×2 IMPLANT

## 2015-04-09 NOTE — Anesthesia Procedure Notes (Signed)
Procedure Name: Intubation Date/Time: 04/09/2015 10:42 AM Performed by: Anne Fu Pre-anesthesia Checklist: Patient identified, Emergency Drugs available, Suction available, Patient being monitored and Timeout performed Patient Re-evaluated:Patient Re-evaluated prior to inductionOxygen Delivery Method: Circle system utilized Preoxygenation: Pre-oxygenation with 100% oxygen Intubation Type: IV induction Ventilation: Mask ventilation without difficulty Laryngoscope Size: Mac and 4 Grade View: Grade I Tube type: Oral Tube size: 7.5 mm Number of attempts: 1 Airway Equipment and Method: Stylet Placement Confirmation: ETT inserted through vocal cords under direct vision,  positive ETCO2,  CO2 detector and breath sounds checked- equal and bilateral Secured at: 20 cm Tube secured with: Tape Dental Injury: Teeth and Oropharynx as per pre-operative assessment

## 2015-04-09 NOTE — Op Note (Signed)
04/08/2015 - 04/09/2015  12:03 PM  PATIENT:  Bethany Hanna  35 y.o. female  PRE-OPERATIVE DIAGNOSIS:  cholecystitis  POST-OPERATIVE DIAGNOSIS:  cholecystitis  PROCEDURE:  Procedure(s): LAPAROSCOPIC CHOLECYSTECTOMY WITH INTRAOPERATIVE CHOLANGIOGRAM (N/A)  SURGEON:  Surgeon(s) and Role:    * Jovita Kussmaul, MD - Primary  PHYSICIAN ASSISTANT:   ASSISTANTS: Jomarie Longs, PA   ANESTHESIA:   general  EBL:     BLOOD ADMINISTERED:none  DRAINS: none   LOCAL MEDICATIONS USED:  MARCAINE     SPECIMEN:  Source of Specimen:  gallbladder  DISPOSITION OF SPECIMEN:  PATHOLOGY  COUNTS:  YES  TOURNIQUET:  * No tourniquets in log *  DICTATION: .Dragon Dictation   Procedure: After informed consent was obtained the patient was brought to the operating room and placed in the supine position on the operating room table. After adequate induction of general anesthesia the patient's abdomen was prepped with ChloraPrep allowed to dry and draped in usual sterile manner. The area below the umbilicus was infiltrated with quarter percent  Marcaine. A small incision was made with a 15 blade knife. The incision was carried down through the subcutaneous tissue bluntly with a hemostat and [employer] retractors. The linea alba was identified. The linea alba was incised with a 15 blade knife and each side was grasped with Coker clamps. The preperitoneal space was then probed with a hemostat until the peritoneum was opened and access was gained to the abdominal cavity. A 0 Vicryl pursestring stitch was placed in the fascia surrounding the opening. A Hassan cannula was then placed through the opening and anchored in place with the previously placed Vicryl purse string stitch. The abdomen was insufflated with carbon dioxide without difficulty. A laparoscope was inserted through the [facility] cannula in the right upper quadrant was inspected. Next the epigastric region was infiltrated with % Marcaine. A small incision was  made with a 15 blade knife. A 5 mm port was placed bluntly through this incision into the abdominal cavity under direct vision. Next 2 sites were chosen laterally on the right side of the abdomen for placement of 5 mm ports. Each of these areas was infiltrated with quarter percent Marcaine. Small stab incisions were made with a 15 blade knife. 5 mm ports were then placed bluntly through these incisions into the abdominal cavity under direct vision without difficulty. A blunt grasper was placed through the lateralmost 5 mm port and used to grasp the dome of the gallbladder and elevated anteriorly and superiorly. Another blunt grasper was placed through the other 5 mm port and used to retract the body and neck of the gallbladder. A dissector was placed through the epigastric port and using the electrocautery the peritoneal reflection at the gallbladder neck was opened. Blunt dissection was then carried out in this area until the gallbladder neck-cystic duct junction was readily identified and a good window was created. A single clip was placed on the gallbladder neck. A small  ductotomy was made just below the clip with laparoscopic scissors. A 14-gauge Angiocath was then placed through the anterior abdominal wall under direct vision. A Reddick cholangiogram catheter was then placed through the Angiocath and flushed. The catheter was then placed in the cystic duct and anchored in place with a clip. A cholangiogram was obtained that showed no filling defects good emptying into the duodenum an adequate length on the cystic duct. The anchoring clip and catheters were then removed from the patient. 3 clips were placed proximally on the cystic  duct and the duct was divided between the 2 sets of clips. Posterior to this the cystic artery was identified and again dissected bluntly in a circumferential manner until a good window  was created. 2 clips were placed proximally and one distally on the artery and the artery was  divided between the 2 sets of clips. Next a laparoscopic hook cautery device was used to separate the gallbladder from the liver bed. Prior to completely detaching the gallbladder from the liver bed the liver bed was inspected and several small bleeding points were coagulated with the electrocautery until the area was completely hemostatic. The gallbladder was then detached the rest of it from the liver bed without difficulty. A laparoscopic bag was inserted through the hassan port. The laparoscope was moved to the epigastric port. The gallbladder was placed within the bag and the bag was sealed.  The bag with the gallbladder was then removed with the [facility] cannula through the infraumbilical port without difficulty. The fascial defect was then closed with the previously placed Vicryl pursestring stitch as well as with another figure-of-eight 0 Vicryl stitch. The liver bed was inspected again and found to be hemostatic. The abdomen was irrigated with copious amounts of saline until the effluent was clear. The ports were then removed under direct vision without difficulty and were found to be hemostatic. The gas was allowed to escape. The skin incisions were all closed with interrupted 4-0 Monocryl subcuticular stitches. Dermabond dressings were applied. The patient tolerated the procedure well. At the end of the case all needle sponge and instrument counts were correct. The patient was then awakened and taken to recovery in stable condition  PLAN OF CARE: Admit for overnight observation  PATIENT DISPOSITION:  PACU - hemodynamically stable.   Delay start of Pharmacological VTE agent (>24hrs) due to surgical blood loss or risk of bleeding: no

## 2015-04-09 NOTE — Progress Notes (Signed)
Day of Surgery  Subjective: Feeling a little better  Objective: Vital signs in last 24 hours: Temp:  [98.1 F (36.7 C)-98.7 F (37.1 C)] 98.7 F (37.1 C) (02/03 0602) Pulse Rate:  [78-84] 80 (02/03 0602) Resp:  [16-20] 16 (02/03 0602) BP: (109-157)/(64-97) 109/64 mmHg (02/03 0602) SpO2:  [98 %-100 %] 100 % (02/03 0602) Weight:  [93.94 kg (207 lb 1.6 oz)] 93.94 kg (207 lb 1.6 oz) (02/03 0140)    Intake/Output from previous day: 02/02 0701 - 02/03 0700 In: -  Out: 3 [Urine:3] Intake/Output this shift:    Resp: clear to auscultation bilaterally Cardio: regular rate and rhythm GI: soft, tendern RUQ  Lab Results:   Recent Labs  04/08/15 2224 04/09/15 0646  WBC 16.4* 9.2  HGB 13.3 11.9*  HCT 40.1 35.8*  PLT 397 360   BMET  Recent Labs  04/08/15 2224 04/09/15 0646  NA 136 140  K 3.8 4.5  CL 105 109  CO2 20* 24  GLUCOSE 110* 111*  BUN 12 7  CREATININE 0.63 0.53  CALCIUM 9.0 8.8*   PT/INR No results for input(s): LABPROT, INR in the last 72 hours. ABG No results for input(s): PHART, HCO3 in the last 72 hours.  Invalid input(s): PCO2, PO2  Studies/Results: US Abdomen Limited Ruq  04/08/2015  CLINICAL DATA:  RIGHT upper quadrant pain with nausea and vomiting beginning today. EXAM: US ABDOMEN LIMITED - RIGHT UPPER QUADRANT COMPARISON:  None. FINDINGS: Gallbladder: Multiple tiny dependent echogenic gallstones and layering gallbladder sludge. Mild gallbladder distension. Mild gallbladder wall thickening at 3 mm. Small amount of pericholecystic fluid. Sonographic Murphy's sign elicited. Common bile duct: Diameter: 2 mm Liver: No focal lesion identified. Within normal limits in parenchymal echogenicity. Hepatopetal portal vein. IMPRESSION: Cholelithiasis and acute cholecystitis. Electronically Signed   By: Elon Alas M.D.   On: 04/08/2015 23:30    Anti-infectives: Anti-infectives    Start     Dose/Rate Route Frequency Ordered Stop   04/09/15 2200   cefTRIAXone (ROCEPHIN) 2 g in dextrose 5 % 50 mL IVPB     2 g 100 mL/hr over 30 Minutes Intravenous Every 24 hours 04/09/15 0140     04/09/15 0045  cefTRIAXone (ROCEPHIN) 2 g in dextrose 5 % 50 mL IVPB  Status:  Discontinued     2 g 100 mL/hr over 30 Minutes Intravenous Every 24 hours 04/09/15 0043 04/09/15 0140   04/08/15 2345  cefTRIAXone (ROCEPHIN) 2 g in dextrose 5 % 50 mL IVPB     2 g 100 mL/hr over 30 Minutes Intravenous  Once 04/08/15 2334 04/09/15 0105      Assessment/Plan: s/p Procedure(s): LAPAROSCOPIC CHOLECYSTECTOMY WITH INTRAOPERATIVE CHOLANGIOGRAM (N/A) plan for lap chole with ioc. Risks and benefits of the surgery to remove gallbladder were discussed with the patient in detail including some of the technical aspects and she understands and wishes to proceed     TOTH III,Redmond Whittley S 04/09/2015

## 2015-04-09 NOTE — Anesthesia Postprocedure Evaluation (Signed)
Anesthesia Post Note  Patient: Bethany Hanna  Procedure(s) Performed: Procedure(s) (LRB): LAPAROSCOPIC CHOLECYSTECTOMY WITH INTRAOPERATIVE CHOLANGIOGRAM (N/A)  Patient location during evaluation: PACU Anesthesia Type: General Level of consciousness: awake and alert Pain management: pain level controlled Vital Signs Assessment: post-procedure vital signs reviewed and stable Respiratory status: spontaneous breathing, nonlabored ventilation, respiratory function stable and patient connected to nasal cannula oxygen Cardiovascular status: blood pressure returned to baseline and stable Postop Assessment: no signs of nausea or vomiting Anesthetic complications: no    Last Vitals:  Filed Vitals:   04/09/15 0602 04/09/15 1216  BP: 109/64   Pulse: 80   Temp: 37.1 C 36.8 C  Resp: 16 12    Last Pain:  Filed Vitals:   04/09/15 1224  PainSc: 8                  Sajjad Honea

## 2015-04-09 NOTE — Transfer of Care (Signed)
Immediate Anesthesia Transfer of Care Note  Patient: Bethany Hanna  Procedure(s) Performed: Procedure(s): LAPAROSCOPIC CHOLECYSTECTOMY WITH INTRAOPERATIVE CHOLANGIOGRAM (N/A)  Patient Location: PACU  Anesthesia Type:General  Level of Consciousness:  sedated, patient cooperative and responds to stimulation  Airway & Oxygen Therapy:Patient Spontanous Breathing and Patient connected to face mask oxgen  Post-op Assessment:  Report given to PACU RN and Post -op Vital signs reviewed and stable  Post vital signs:  Reviewed and stable  Last Vitals:  Filed Vitals:   04/09/15 0140 04/09/15 0602  BP: 124/93 109/64  Pulse: 84 80  Temp: 36.7 C 37.1 C  Resp: 18 16    Complications: No apparent anesthesia complications

## 2015-04-09 NOTE — H&P (Signed)
Bethany Hanna is an 35 y.o. female.   Chief Complaint:  Severe epigastric pain radiating to the back associated with nausea and vomiting HPI: This is a 35 year old female with the onset of severe epigastric pain and radiating to the back with nausea and vomiting.  She's had intermittent episodes like this since June 2016 (when her child was born) but the pain was not as severe and the episodes were self-limited. These typically followed a meal. This time, the pain did not let up and she presented to the emergency room where she is noted to have right upper quadrant tenderness and guarding, elevated transaminases, elevated white blood cell count, and an ultrasound showing cholelithiasis and signs of acute cholecystitis. We were asked to see her because of this. Her husband is with her.  History reviewed. No pertinent past medical history.  Past Surgical History  Procedure Laterality Date  . Dilatation & currettage/hysteroscopy with resectocope N/A 07/31/2013    Procedure: DILATATION & CURETTAGE/HYSTEROSCOPY WITH RESECTOCOPE;  Surgeon: Princess Bruins, MD;  Location: [facility];  Service: Gynecology;  Laterality: N/A;  1 hr.    Family History  Problem Relation Age of Onset  . Hypertension Father    Social History:  reports that she has never smoked. She does not have any smokeless tobacco history on file. She reports that she drinks alcohol. She reports that she does not use illicit drugs.  Allergies: No Known Allergies   Prior to Admission medications   Medication Sig Start Date End Date Taking? Authorizing Provider  Ca Carbonate-Mag Hydroxide (ROLAIDS PO) Take 1 tablet by mouth as needed (heartburn).   Yes Historical Provider, MD  ibuprofen (ADVIL,MOTRIN) 200 MG tablet Take 200 mg by mouth every 6 (six) hours as needed for moderate pain.   Yes Historical Provider, MD  Prenatal Vit-Fe Fumarate-FA (PRENATAL MULTIVITAMIN) TABS Take 1 tablet by mouth daily.   Yes Historical Provider, MD   ranitidine (ZANTAC) 75 MG tablet Take 75 mg by mouth at bedtime.   Yes Historical Provider, MD  ibuprofen (ADVIL,MOTRIN) 600 MG tablet Take 1 tablet (600 mg total) by mouth every 6 (six) hours. Patient not taking: Reported on 04/08/2015 08/09/14   Graciela Husbands, CNM  iron polysaccharides (NIFEREX) 150 MG capsule Take 1 capsule (150 mg total) by mouth daily. Patient not taking: Reported on 04/08/2015 08/09/14   Graciela Husbands, CNM  magnesium oxide (MAG-OX) 400 (241.3 MG) MG tablet Take 0.5 tablets (200 mg total) by mouth daily. Patient not taking: Reported on 04/08/2015 08/09/14   Graciela Husbands, CNM  oxyCODONE-acetaminophen (PERCOCET/ROXICET) 5-325 MG per tablet Take 1-2 tablets by mouth every 4 (four) hours as needed (for pain scale equal to or greater than 7). Patient not taking: Reported on 04/08/2015 08/09/14   Graciela Husbands, CNM      (Not in a hospital admission)  Results for orders placed or performed during the hospital encounter of 04/08/15 (from the past 48 hour(s))  CBC with Differential/Platelet     Status: Abnormal   Collection Time: 04/08/15 10:24 PM  Result Value Ref Range   WBC 16.4 (H) 4.0 - 10.5 K/uL   RBC 4.52 3.87 - 5.11 MIL/uL   Hemoglobin 13.3 12.0 - 15.0 g/dL   HCT 40.1 36.0 - 46.0 %   MCV 88.7 78.0 - 100.0 fL   MCH 29.4 26.0 - 34.0 pg   MCHC 33.2 30.0 - 36.0 g/dL   RDW 12.2 11.5 - 15.5 %   Platelets 397 150 -  400 K/uL   Neutrophils Relative % 83 %   Neutro Abs 13.5 (H) 1.7 - 7.7 K/uL   Lymphocytes Relative 11 %   Lymphs Abs 1.8 0.7 - 4.0 K/uL   Monocytes Relative 6 %   Monocytes Absolute 1.0 0.1 - 1.0 K/uL   Eosinophils Relative 0 %   Eosinophils Absolute 0.0 0.0 - 0.7 K/uL   Basophils Relative 0 %   Basophils Absolute 0.0 0.0 - 0.1 K/uL  Comprehensive metabolic panel     Status: Abnormal   Collection Time: 04/08/15 10:24 PM  Result Value Ref Range   Sodium 136 135 - 145 mmol/L   Potassium 3.8 3.5 - 5.1 mmol/L   Chloride 105 101 - 111 mmol/L   CO2 20  (L) 22 - 32 mmol/L   Glucose, Bld 110 (H) 65 - 99 mg/dL   BUN 12 6 - 20 mg/dL   Creatinine, Ser 0.63 0.44 - 1.00 mg/dL   Calcium 9.0 8.9 - 10.3 mg/dL   Total Protein 7.9 6.5 - 8.1 g/dL   Albumin 4.4 3.5 - 5.0 g/dL   AST 217 (H) 15 - 41 U/L   ALT 123 (H) 14 - 54 U/L   Alkaline Phosphatase 114 38 - 126 U/L   Total Bilirubin 0.9 0.3 - 1.2 mg/dL   GFR calc non Af Amer >60 >60 mL/min   GFR calc Af Amer >60 >60 mL/min    Comment: (NOTE) The eGFR has been calculated using the CKD EPI equation. This calculation has not been validated in all clinical situations. eGFR's persistently <60 mL/min signify possible Chronic Kidney Disease.    Anion gap 11 5 - 15  Lipase, blood     Status: None   Collection Time: 04/08/15 10:24 PM  Result Value Ref Range   Lipase 41 11 - 51 U/L  Pregnancy, urine     Status: None   Collection Time: 04/09/15 12:04 AM  Result Value Ref Range   Preg Test, Ur NEGATIVE NEGATIVE    Comment:        THE SENSITIVITY OF THIS METHODOLOGY IS >20 mIU/mL.    US Abdomen Limited Ruq  04/08/2015  CLINICAL DATA:  RIGHT upper quadrant pain with nausea and vomiting beginning today. EXAM: US ABDOMEN LIMITED - RIGHT UPPER QUADRANT COMPARISON:  None. FINDINGS: Gallbladder: Multiple tiny dependent echogenic gallstones and layering gallbladder sludge. Mild gallbladder distension. Mild gallbladder wall thickening at 3 mm. Small amount of pericholecystic fluid. Sonographic Murphy's sign elicited. Common bile duct: Diameter: 2 mm Liver: No focal lesion identified. Within normal limits in parenchymal echogenicity. Hepatopetal portal vein. IMPRESSION: Cholelithiasis and acute cholecystitis. Electronically Signed   By: Elon Alas M.D.   On: 04/08/2015 23:30    Review of Systems  Constitutional: Negative for fever and chills.  Gastrointestinal: Positive for nausea, vomiting and abdominal pain.  Musculoskeletal: Positive for back pain.  Neurological: Negative for dizziness, focal  weakness and weakness.    Blood pressure 133/91, pulse 81, temperature 98.7 F (37.1 C), temperature source Oral, resp. rate 20, last menstrual period 02/16/2015, SpO2 98 %, unknown if currently breastfeeding. Physical Exam  Constitutional:  Overweight female in NAD.  HENT:  Head: Normocephalic and atraumatic.  Eyes: EOM are normal. No scleral icterus.  Cardiovascular: Normal rate and regular rhythm.   Respiratory: Effort normal and breath sounds normal.  GI: Soft. There is tenderness (RUQ). There is guarding (RUQ).  Musculoskeletal: She exhibits no edema.  Neurological: She is alert.  Skin: Skin is warm and  dry.  Psychiatric: She has a normal mood and affect. Her behavior is normal.     Assessment/Plan Acute calculus cholecystitis.  Plan: Admit to the hospital, IV fluid hydration, IV antibiotics, repeat CMET and CBC later this morning. Laparoscopic possible open cholecystectomy this admission. I have explained the procedure, risks, and aftercare of cholecystectomy.  Risks include but are not limited to bleeding, infection, wound problems, anesthesia, diarrhea, bile leak, injury to common bile duct/liver/intestine.  She seems to understand and agrees with the plan.  Odis Hollingshead, MD 04/09/2015, 12:44 AM

## 2015-04-09 NOTE — Anesthesia Preprocedure Evaluation (Addendum)
Anesthesia Evaluation  Patient identified by MRN, date of birth, ID band Patient awake    Reviewed: Allergy & Precautions, H&P , Patient's Chart, lab work & pertinent test results  Airway Mallampati: III  TM Distance: >3 FB Neck ROM: full    Dental no notable dental hx. (+) Teeth Intact, Dental Advisory Given   Pulmonary neg pulmonary ROS,    Pulmonary exam normal breath sounds clear to auscultation       Cardiovascular negative cardio ROS Normal cardiovascular exam Rhythm:regular Rate:Normal     Neuro/Psych negative neurological ROS  negative psych ROS   GI/Hepatic negative GI ROS, Neg liver ROS,   Endo/Other  Obesity  Renal/GU negative Renal ROS  negative genitourinary   Musculoskeletal   Abdominal (+)  Abdomen: soft.    Peds  Hematology negative hematology ROS (+)   Anesthesia Other Findings   Reproductive/Obstetrics PG neg 04/08/2015                            Anesthesia Physical  Anesthesia Plan  ASA: I  Anesthesia Plan: General   Post-op Pain Management:    Induction: Intravenous  Airway Management Planned: Oral ETT  Additional Equipment:   Intra-op Plan:   Post-operative Plan: Extubation in OR  Informed Consent: I have reviewed the patients History and Physical, chart, labs and discussed the procedure including the risks, benefits and alternatives for the proposed anesthesia with the patient or authorized representative who has indicated his/her understanding and acceptance.     Plan Discussed with: Anesthesiologist  Anesthesia Plan Comments:         Anesthesia Quick Evaluation

## 2015-04-10 MED ORDER — DOCUSATE SODIUM 100 MG PO CAPS: 100.0000 mg | ORAL_CAPSULE | Freq: Two times a day (BID) | ORAL | Status: DC

## 2015-04-10 MED ORDER — DOCUSATE SODIUM 100 MG PO CAPS
100.0000 mg | ORAL_CAPSULE | Freq: Two times a day (BID) | ORAL | Status: DC
  Administered 2015-04-10: 100 mg via ORAL
  Filled 2015-04-10: qty 1

## 2015-04-10 MED ORDER — OXYCODONE-ACETAMINOPHEN 5-325 MG PO TABS: 1.0000 | ORAL_TABLET | ORAL | Status: DC | PRN

## 2015-04-10 NOTE — Discharge Summary (Signed)
Physician Discharge Summary  Patient ID: DELTA SIMARD MRN: [id number] DOB/AGE: [birth year] 35 y.o.  Admit date: 04/08/2015 Discharge date: 04/10/2015  Admission Diagnoses: Acute cholecystitis   Discharge Diagnoses:  Active Problems:   Acute calculous cholecystitis   Discharged Condition: good  Hospital Course: patient admitted for cholecystitis.  SHe underwent a lap cholecystectomy the following day.  She was discharged to home once she was tolerating a diet and PO narcotics.  Consults: None  Significant Diagnostic Studies: labs: cbc, chemistry  Treatments: IV hydration, antibiotics: ceftriaxone, analgesia: percocet and surgery: lap chole  Discharge Exam: Blood pressure 109/59, pulse 63, temperature 98.1 F (36.7 C), temperature source Oral, resp. rate 16, height 5\' 8"  (1.727 m), weight 93.94 kg (207 lb 1.6 oz), last menstrual period 02/16/2015, SpO2 97 %, unknown if currently breastfeeding. General appearance: alert and cooperative GI: normal findings: soft, non-tender Incision/Wound: clean, dry, intact  Disposition: 01-Home or Self Care     Medication List    TAKE these medications        ibuprofen 200 MG tablet  Commonly known as:  ADVIL,MOTRIN  Take 200 mg by mouth every 6 (six) hours as needed for moderate pain.     ibuprofen 600 MG tablet  Commonly known as:  ADVIL,MOTRIN  Take 1 tablet (600 mg total) by mouth every 6 (six) hours.     iron polysaccharides 150 MG capsule  Commonly known as:  NIFEREX  Take 1 capsule (150 mg total) by mouth daily.     magnesium oxide 400 (241.3 Mg) MG tablet  Commonly known as:  MAG-OX  Take 0.5 tablets (200 mg total) by mouth daily.     oxyCODONE-acetaminophen 5-325 MG tablet  Commonly known as:  PERCOCET/ROXICET  Take 1-2 tablets by mouth every 4 (four) hours as needed (for pain scale equal to or greater than 7).     prenatal multivitamin Tabs tablet  Take 1 tablet by mouth daily.     ranitidine 75 MG tablet   Commonly known as:  ZANTAC  Take 75 mg by mouth at bedtime.     ROLAIDS PO  Take 1 tablet by mouth as needed (heartburn).           Follow-up Information    Follow up with [facility]. Schedule an appointment as soon as possible for a visit in 2 weeks.   Specialty:  General Surgery   Contact information:   [address] [place] [phone]      Signed: Rosario Adie [id number], [id number] AM

## 2015-04-10 NOTE — Progress Notes (Signed)
1 Day Post-Op  Subjective: S/p lap chole, doing well.  Tolerating clears.  Objective: Vital signs in last 24 hours: Temp:  [98 F (36.7 C)-98.8 F (37.1 C)] 98.1 F (36.7 C) (02/04 0549) Pulse Rate:  [63-111] 63 (02/04 0549) Resp:  [10-16] 16 (02/04 0549) BP: (109-149)/(54-93) 109/59 mmHg (02/04 0549) SpO2:  [96 %-100 %] 97 % (02/04 0549)   Intake/Output from previous day: 02/03 0701 - 02/04 0700 In: 1340 [P.O.:240; I.V.:1100] Out: 450 [Urine:450] Intake/Output this shift:     General appearance: alert and cooperative GI: normal findings: soft, non-tender  Incision: no significant drainage, no significant erythema  Lab Results:   Recent Labs  04/08/15 2224 04/09/15 0646  WBC 16.4* 9.2  HGB 13.3 11.9*  HCT 40.1 35.8*  PLT 397 360   BMET  Recent Labs  04/08/15 2224 04/09/15 0646  NA 136 140  K 3.8 4.5  CL 105 109  CO2 20* 24  GLUCOSE 110* 111*  BUN 12 7  CREATININE 0.63 0.53  CALCIUM 9.0 8.8*   PT/INR No results for input(s): LABPROT, INR in the last 72 hours. ABG No results for input(s): PHART, HCO3 in the last 72 hours.  Invalid input(s): PCO2, PO2  MEDS, Scheduled . cefTRIAXone (ROCEPHIN)  IV  2 g Intravenous Q24H  . pantoprazole (PROTONIX) IV  40 mg Intravenous QHS    Studies/Results: Dg Cholangiogram Operative  04/09/2015  CLINICAL DATA:  Cholecystectomy for cholelithiasis and cholecystitis. EXAM: INTRAOPERATIVE CHOLANGIOGRAM TECHNIQUE: Cholangiographic images from the C-arm fluoroscopic device were submitted for interpretation post-operatively. Please see the procedural report for the amount of contrast and the fluoroscopy time utilized. COMPARISON:  Ultrasound on 04/08/2015 FINDINGS: Intraoperative imaging shows normally patent opacified bile ducts without evidence of obstruction or filling defect. Contrast is seen to enter the duodenum. No contrast extravasation is identified. IMPRESSION: Normal intraoperative cholangiogram. Electronically  Signed   By: Aletta Edouard M.D.   On: 04/09/2015 11:53   US Abdomen Limited Ruq  04/08/2015  CLINICAL DATA:  RIGHT upper quadrant pain with nausea and vomiting beginning today. EXAM: US ABDOMEN LIMITED - RIGHT UPPER QUADRANT COMPARISON:  None. FINDINGS: Gallbladder: Multiple tiny dependent echogenic gallstones and layering gallbladder sludge. Mild gallbladder distension. Mild gallbladder wall thickening at 3 mm. Small amount of pericholecystic fluid. Sonographic Murphy's sign elicited. Common bile duct: Diameter: 2 mm Liver: No focal lesion identified. Within normal limits in parenchymal echogenicity. Hepatopetal portal vein. IMPRESSION: Cholelithiasis and acute cholecystitis. Electronically Signed   By: Elon Alas M.D.   On: 04/08/2015 23:30    Assessment: s/p Procedure(s): LAPAROSCOPIC CHOLECYSTECTOMY WITH INTRAOPERATIVE CHOLANGIOGRAM Patient Active Problem List   Diagnosis Date Noted  . Acute calculous cholecystitis 04/09/2015  . Leukocytosis 08/08/2014  . Acute blood loss anemia 08/08/2014  . Postpartum care following vaginal delivery (6/3) 08/07/2014  . Active labor at term 08/06/2014    Expected post op course  Plan: Discharge if tolerates diet this am       .Rosario Adie, [facility], [place] [phone]   04/10/2015 8:46 AM

## 2015-04-10 NOTE — Discharge Instructions (Signed)

## 2015-04-10 NOTE — Progress Notes (Signed)
Patient alert and oriented with pain controlled. Diet tolerated.  Patient given discharge instructions and prescriptions. Patient verbalized understanding of instructions/home care.  All questions answered.

## 2015-08-24 LAB — OB RESULTS CONSOLE HEPATITIS B SURFACE ANTIGEN: Hepatitis B Surface Ag: NEGATIVE

## 2015-08-24 LAB — OB RESULTS CONSOLE RPR: RPR: NONREACTIVE

## 2015-08-24 LAB — OB RESULTS CONSOLE GC/CHLAMYDIA
Chlamydia: NEGATIVE
Gonorrhea: NEGATIVE

## 2015-08-24 LAB — OB RESULTS CONSOLE RUBELLA ANTIBODY, IGM: Rubella: IMMUNE

## 2015-08-24 LAB — OB RESULTS CONSOLE HIV ANTIBODY (ROUTINE TESTING): HIV: NONREACTIVE

## 2016-01-29 ENCOUNTER — Inpatient Hospital Stay (HOSPITAL_COMMUNITY)
Admission: AD | Admit: 2016-01-29 | Discharge: 2016-01-29 | Disposition: A | Discharge status: Home / Self Care | Payer: BLUE CROSS/BLUE SHIELD | Source: Ambulatory Visit | Attending: Obstetrics and Gynecology | Admitting: Obstetrics and Gynecology

## 2016-01-29 ENCOUNTER — Encounter (HOSPITAL_COMMUNITY): Payer: Self-pay

## 2016-01-29 DIAGNOSIS — Z9889 Other specified postprocedural states: Secondary | ICD-10-CM | POA: Insufficient documentation

## 2016-01-29 DIAGNOSIS — Z3A33 33 weeks gestation of pregnancy: Secondary | ICD-10-CM | POA: Diagnosis not present

## 2016-01-29 DIAGNOSIS — Z79899 Other long term (current) drug therapy: Secondary | ICD-10-CM | POA: Insufficient documentation

## 2016-01-29 DIAGNOSIS — Z9049 Acquired absence of other specified parts of digestive tract: Secondary | ICD-10-CM | POA: Diagnosis not present

## 2016-01-29 DIAGNOSIS — K5222 Food protein-induced enteropathy: Secondary | ICD-10-CM | POA: Diagnosis not present

## 2016-01-29 DIAGNOSIS — Z8249 Family history of ischemic heart disease and other diseases of the circulatory system: Secondary | ICD-10-CM | POA: Insufficient documentation

## 2016-01-29 DIAGNOSIS — O26893 Other specified pregnancy related conditions, third trimester: Secondary | ICD-10-CM | POA: Insufficient documentation

## 2016-01-29 DIAGNOSIS — O219 Vomiting of pregnancy, unspecified: Secondary | ICD-10-CM

## 2016-01-29 LAB — GASTROINTESTINAL PANEL BY PCR, STOOL (REPLACES STOOL CULTURE)
ADENOVIRUS F40/41: NOT DETECTED
ASTROVIRUS: NOT DETECTED
CAMPYLOBACTER SPECIES: NOT DETECTED
Cryptosporidium: NOT DETECTED
Cyclospora cayetanensis: NOT DETECTED
ENTEROPATHOGENIC E COLI (EPEC): NOT DETECTED
ENTEROTOXIGENIC E COLI (ETEC): NOT DETECTED
Entamoeba histolytica: NOT DETECTED
Enteroaggregative E coli (EAEC): NOT DETECTED
Giardia lamblia: NOT DETECTED
NOROVIRUS GI/GII: DETECTED — AB
PLESIMONAS SHIGELLOIDES: NOT DETECTED
Rotavirus A: NOT DETECTED
SAPOVIRUS (I, II, IV, AND V): DETECTED — AB
SHIGA LIKE TOXIN PRODUCING E COLI (STEC): NOT DETECTED
Salmonella species: NOT DETECTED
Shigella/Enteroinvasive E coli (EIEC): NOT DETECTED
Vibrio cholerae: NOT DETECTED
Vibrio species: NOT DETECTED
Yersinia enterocolitica: NOT DETECTED

## 2016-01-29 MED ORDER — LACTATED RINGERS IV BOLUS (SEPSIS)
1000.0000 mL | Freq: Once | INTRAVENOUS | Status: AC
  Administered 2016-01-29: 1000 mL via INTRAVENOUS

## 2016-01-29 MED ORDER — ONDANSETRON HCL 4 MG PO TABS: 4.0000 mg | ORAL_TABLET | Freq: Three times a day (TID) | ORAL | 0 refills | Status: DC | PRN

## 2016-01-29 MED ORDER — PROMETHAZINE HCL 25 MG/ML IJ SOLN
25.0000 mg | Freq: Once | INTRAVENOUS | Status: AC
  Administered 2016-01-29: 25 mg via INTRAVENOUS
  Filled 2016-01-29: qty 1

## 2016-01-29 MED ORDER — ONDANSETRON HCL 4 MG/2ML IJ SOLN
4.0000 mg | Freq: Once | INTRAMUSCULAR | Status: AC
  Administered 2016-01-29: 4 mg via INTRAVENOUS
  Filled 2016-01-29: qty 2

## 2016-01-29 NOTE — Progress Notes (Signed)
Notified of pt arrival in MAU and complaint with tachycardiac FHR tracing. Will give IV fluid bolus.

## 2016-01-29 NOTE — MAU Provider Note (Signed)
Chief Complaint:  Nausea; Emesis; Diarrhea; and Contractions   First Provider Initiated Contact with Patient 01/29/16 0150      HPI: Bethany Hanna is a 35 y.o. G2P1001 at [redacted]w[redacted]d who presents to maternity admissions reporting intractable nausea and vomiting with diarrhea.  Patient states at about 5PM today she started vomiting and has not been able to stop since. A couples hours shortly after has had numerous loose BMs (diarrhea). She tried a phenergan but threw it up right after. No one else is having the same symptoms. She had a pizza for lunch she split with her mother (mom not having symptoms), and a chicken nugget from one of her children (also not having symptoms). She has not had any raw meat or food that has been sitting out. She states she has a couple contractions every hour but nothing persistent or strong.  Denies leakage of fluid or vaginal bleeding. Good fetal movement.   Pregnancy Course:   Past Medical History: History reviewed. No pertinent past medical history.  Past obstetric history: OB History  Gravida Para Term Preterm AB Living  2 1 1     1   SAB TAB Ectopic Multiple Live Births        0 1    # Outcome Date GA Lbr Len/2nd Weight Sex Delivery Anes PTL Lv  2 Current           1 Term 08/07/14 [redacted]w[redacted]d 211:50 / 01:55 8 lb 11 oz (3.941 kg) M Vag-Spont EPI  LIV      Past Surgical History: Past Surgical History:  Procedure Laterality Date  . CHOLECYSTECTOMY N/A 04/09/2015   Procedure: LAPAROSCOPIC CHOLECYSTECTOMY WITH INTRAOPERATIVE CHOLANGIOGRAM;  Surgeon: Autumn Messing III, MD;  Location: [facility];  Service: General;  Laterality: N/A;  . DILATATION & CURRETTAGE/HYSTEROSCOPY WITH RESECTOCOPE N/A 07/31/2013   Procedure: [place];  Surgeon: Princess Bruins, MD;  Location: [facility];  Service: Gynecology;  Laterality: N/A;  1 hr.     Family History: Family History  Problem Relation Age of Onset  . Hypertension Father     Social  History: Social History  Substance Use Topics  . Smoking status: Never Smoker  . Smokeless tobacco: Never Used  . Alcohol use Yes     Comment: social     Allergies: No Known Allergies  Meds:  Prescriptions Prior to Admission  Medication Sig Dispense Refill Last Dose  . Ca Carbonate-Mag Hydroxide (ROLAIDS PO) Take 1 tablet by mouth as needed (heartburn).   04/08/2015 at Unknown time  . docusate sodium (COLACE) 100 MG capsule Take 1 capsule (100 mg total) by mouth 2 (two) times daily. 10 capsule 0   . ibuprofen (ADVIL,MOTRIN) 200 MG tablet Take 200 mg by mouth every 6 (six) hours as needed for moderate pain.   04/08/2015 at Unknown time  . ibuprofen (ADVIL,MOTRIN) 600 MG tablet Take 1 tablet (600 mg total) by mouth every 6 (six) hours. (Patient not taking: Reported on 04/08/2015) 30 tablet 0   . iron polysaccharides (NIFEREX) 150 MG capsule Take 1 capsule (150 mg total) by mouth daily. (Patient not taking: Reported on 04/08/2015) 30 capsule 1   . magnesium oxide (MAG-OX) 400 (241.3 MG) MG tablet Take 0.5 tablets (200 mg total) by mouth daily. (Patient not taking: Reported on 04/08/2015) 30 tablet 1   . oxyCODONE-acetaminophen (PERCOCET/ROXICET) 5-325 MG tablet Take 1-2 tablets by mouth every 4 (four) hours as needed (for pain scale equal to or greater than 7). Coleman  tablet 0   . Prenatal Vit-Fe Fumarate-FA (PRENATAL MULTIVITAMIN) TABS Take 1 tablet by mouth daily.   04/08/2015 at Unknown time  . ranitidine (ZANTAC) 75 MG tablet Take 75 mg by mouth at bedtime.   04/08/2015 at Unknown time    I have reviewed patient's Past Medical Hx, Surgical Hx, Family Hx, Social Hx, medications and allergies.   ROS:  A comprehensive ROS was negative except per HPI.    Physical Exam   Patient Vitals for the past 24 hrs:  BP Temp Temp src Pulse Resp SpO2  01/29/16 0339 126/87 - - 89 - -  01/29/16 0243 - - - 98 - 99 %  01/29/16 0238 - - - 119 - 100 %  01/29/16 0233 - - - 100 - 100 %  01/29/16 0228 - - - 104 - 100 %   01/29/16 0223 - - - 111 - 100 %  01/29/16 0218 - - - 95 - 100 %  01/29/16 0213 - - - 112 - 99 %  01/29/16 0208 - - - 117 - 99 %  01/29/16 0203 - - - (!) 122 - 100 %  01/29/16 0152 - - - 113 - 98 %  01/29/16 0147 - - - 116 - -  01/29/16 0107 125/88 97.5 F (36.4 C) Oral (!) 126 20 -   Constitutional: Well-developed, well-nourished female, actively dry heaving and vomiting, looking ill but not septic. Cardiovascular: tachycardic, regular rhythm, no murmurs Respiratory: normal effort, CTAB GI: Abd soft, non-tender, gravid appropriate for gestational age. Pos BS x 4 MS: Extremities nontender, no edema, normal ROM Neurologic: Alert and oriented x 4.  GU: Neg CVAT.   FHT:  Baseline 150 , moderate variability, accelerations present, no decelerations Contractions: Rare Initially on presentation, FHT showed fetal tachycardia, but after fluids given and maternal symptoms improved, FHT were as above.   Labs: No results found for this or any previous visit (from the past 24 hour(s)).  Imaging:  No results found.  MAU Course: Stool culture LR bolus x2 Zofran, Phenergan NST - reactive after IVF PO trial - able to tolerate fluids and crackers   MDM: Plan of care reviewed with patient, including labs and tests ordered and medical treatment.   I personally reviewed the patient's NST today, found to be REACTIVE. 150 bpm, mod var, +accels, no decels. CTX: Rare   Assessment: 1. Food protein induced enteropathy   2. Excessive vomiting in pregnancy     Plan: Discharge home in stable condition.  Preterm Labor precautions and fetal kick counts Supportive care discussed Sent home with Zofran prn (phenergan did not work) Follow up stool cultures outpatient Follow up with OB provider Encouraged hydration when able to tolerate PO       Medication List    STOP taking these medications   ibuprofen 200 MG tablet Commonly known as:  ADVIL,MOTRIN   ibuprofen 600 MG  tablet Commonly known as:  ADVIL,MOTRIN   magnesium oxide 400 (241.3 Mg) MG tablet Commonly known as:  MAG-OX   oxyCODONE-acetaminophen 5-325 MG tablet Commonly known as:  PERCOCET/ROXICET     TAKE these medications   docusate sodium 100 MG capsule Commonly known as:  COLACE Take 1 capsule (100 mg total) by mouth 2 (two) times daily.   iron polysaccharides 150 MG capsule Commonly known as:  NIFEREX Take 1 capsule (150 mg total) by mouth daily.   ondansetron 4 MG tablet Commonly known as:  ZOFRAN Take 1 tablet (4 mg total) by  mouth every 8 (eight) hours as needed for nausea or vomiting.   prenatal multivitamin Tabs tablet Take 1 tablet by mouth daily.   ranitidine 75 MG tablet Commonly known as:  ZANTAC Take 75 mg by mouth at bedtime.   ROLAIDS PO Take 1 tablet by mouth as needed (heartburn).       Katherine Basset, DO OB Fellow [facility], [facility] 01/29/2016 1:51 AM

## 2016-01-29 NOTE — Discharge Instructions (Signed)
Food Poisoning Introduction Food poisoning is an illness that is caused by eating or drinking contaminated foods or drinks. Food poisoning is usually mild and lasts 1-2 days. Foods and drinks can become contaminated because of:  Poor personal hygiene, such as not washing hands well enough or often.  Not storing food properly. For example, not refrigerating raw meat.  Serving, preparing, and storing food on surfaces that are not clean.  Cooking or eating with utensils that are not clean. The most common causes of this condition are:  Viruses.  Bacteria.  Parasites. Follow these instructions at home: Eating and drinking  Drink enough fluids to keep your pee (urine) clear or pale yellow. You may need to drink small amounts of clear liquids often.  Avoid:  Milk.  Caffeine.  Alcohol.  Ask your doctor exactly how you should get enough fluid in your body (rehydrate).  Eat small meals often rather than eating large meals. Medicines  Take over-the-counter and prescription medicines only as told by your doctor. Ask your doctor if you should continue to take any of your regular prescribed and over-the-counter medicines.  If you were prescribed an antibiotic medicine, take it as told by your doctor. Do not stop taking the antibiotic even if you start to feel better. General instructions  Wash your hands fully before you prepare food and after you go to the bathroom (use the toilet). Make sure people who live with you also wash their hands often.  Clean surfaces that you touch with a product that contains chlorine bleach.  Keep all follow-up visits as told by your doctor. This is important. How is this prevented?  Wash your hands, food preparation surfaces, and utensils before and after you handle raw foods.  Use separate food preparation surfaces and storage spaces for raw meat and for fruits and vegetables.  Keep refrigerated foods colder than 51F (5C).  Serve hot foods  right away or keep them heated above 151F (60C).  Store dry foods in cool, dry spaces. Keep them away from too much heat or moisture.  Throw out any foods that:  Do not smell right.  Are in cans that are bulging.  Follow approved canning procedures.  Heat canned foods fully before you taste them.  Drink bottled or sterile water when you travel. Get help right away if: Call 911 or go to the emergency room if:  You have trouble:  Breathing.  Swallowing.  Talking.  Moving.  You have blurry vision.  You cannot eat or drink without throwing up (vomiting).  You pass out (faint).  Your eyes turn yellow.  You continue to throw up or have watery poop (diarrhea).  You start to have pain in your belly (abdomen), your belly pain gets worse, or your belly pain stays in one small area.  You have a fever.  You have blood or mucus in your poop (stools), or your poop looks dark black and tarry.  You have signs of dehydration, such as:  Dark pee, very little pee, or no pee.  Cracked lips.  Not making tears while crying.  Dry mouth.  Sunken eyes.  Sleepiness.  Weakness.  Dizziness. This information is not intended to replace advice given to you by your health care provider. Make sure you discuss any questions you have with your health care provider. Document Released: 08/10/2009 Document Revised: 07/29/2015 Document Reviewed: 08/24/2014  2017 Elsevier

## 2016-01-29 NOTE — MAU Note (Signed)
Pt presents complaining of nausea, vomiting and diarrhea that started at 1600. Also feeling some contractions. Denies leaking or bleeding. Reports good fetal movement.

## 2016-02-03 ENCOUNTER — Ambulatory Visit: Payer: Managed Care, Other (non HMO) | Admitting: Family Medicine

## 2016-02-09 DIAGNOSIS — Z3685 Encounter for antenatal screening for Streptococcus B: Secondary | ICD-10-CM | POA: Diagnosis not present

## 2016-02-16 DIAGNOSIS — Z3A36 36 weeks gestation of pregnancy: Secondary | ICD-10-CM | POA: Diagnosis not present

## 2016-02-16 DIAGNOSIS — O26893 Other specified pregnancy related conditions, third trimester: Secondary | ICD-10-CM | POA: Diagnosis not present

## 2016-02-16 DIAGNOSIS — O26899 Other specified pregnancy related conditions, unspecified trimester: Secondary | ICD-10-CM | POA: Diagnosis not present

## 2016-02-19 LAB — OB RESULTS CONSOLE GBS: GBS: NEGATIVE

## 2016-03-01 ENCOUNTER — Inpatient Hospital Stay (HOSPITAL_COMMUNITY): Payer: BLUE CROSS/BLUE SHIELD | Admitting: Anesthesiology

## 2016-03-01 ENCOUNTER — Inpatient Hospital Stay (HOSPITAL_COMMUNITY)
Admission: AD | Admit: 2016-03-01 | Discharge: 2016-03-03 | DRG: 775 | Disposition: A | Discharge status: Home / Self Care | Payer: BLUE CROSS/BLUE SHIELD | Source: Ambulatory Visit | Attending: Obstetrics and Gynecology | Admitting: Obstetrics and Gynecology

## 2016-03-01 ENCOUNTER — Encounter (HOSPITAL_COMMUNITY): Payer: Self-pay | Admitting: *Deleted

## 2016-03-01 DIAGNOSIS — O9902 Anemia complicating childbirth: Secondary | ICD-10-CM | POA: Diagnosis present

## 2016-03-01 DIAGNOSIS — E669 Obesity, unspecified: Secondary | ICD-10-CM | POA: Diagnosis present

## 2016-03-01 DIAGNOSIS — Z6832 Body mass index (BMI) 32.0-32.9, adult: Secondary | ICD-10-CM

## 2016-03-01 DIAGNOSIS — O9081 Anemia of the puerperium: Secondary | ICD-10-CM | POA: Diagnosis not present

## 2016-03-01 DIAGNOSIS — Z8249 Family history of ischemic heart disease and other diseases of the circulatory system: Secondary | ICD-10-CM | POA: Diagnosis not present

## 2016-03-01 DIAGNOSIS — O872 Hemorrhoids in the puerperium: Secondary | ICD-10-CM | POA: Diagnosis present

## 2016-03-01 DIAGNOSIS — D649 Anemia, unspecified: Secondary | ICD-10-CM | POA: Diagnosis not present

## 2016-03-01 DIAGNOSIS — O4202 Full-term premature rupture of membranes, onset of labor within 24 hours of rupture: Secondary | ICD-10-CM | POA: Diagnosis not present

## 2016-03-01 DIAGNOSIS — Z3A38 38 weeks gestation of pregnancy: Secondary | ICD-10-CM

## 2016-03-01 DIAGNOSIS — O99214 Obesity complicating childbirth: Secondary | ICD-10-CM | POA: Diagnosis not present

## 2016-03-01 LAB — COMPREHENSIVE METABOLIC PANEL
ALBUMIN: 2.3 g/dL — AB (ref 3.5–5.0)
ALK PHOS: 120 U/L (ref 38–126)
ALT: 12 U/L — ABNORMAL LOW (ref 14–54)
ANION GAP: 8 (ref 5–15)
AST: 14 U/L — ABNORMAL LOW (ref 15–41)
BILIRUBIN TOTAL: 0.3 mg/dL (ref 0.3–1.2)
CALCIUM: 8 mg/dL — AB (ref 8.9–10.3)
CO2: 21 mmol/L — ABNORMAL LOW (ref 22–32)
Chloride: 105 mmol/L (ref 101–111)
Creatinine, Ser: 0.55 mg/dL (ref 0.44–1.00)
GFR calc Af Amer: >60 mL/min (ref >60)
GFR calc non Af Amer: >60 mL/min (ref >60)
GLUCOSE: 79 mg/dL (ref 65–99)
Potassium: 3.4 mmol/L — ABNORMAL LOW (ref 3.5–5.1)
Sodium: 134 mmol/L — ABNORMAL LOW (ref 135–145)
TOTAL PROTEIN: 5.9 g/dL — AB (ref 6.5–8.1)

## 2016-03-01 LAB — CBC
HCT: 34.5 % — ABNORMAL LOW (ref 36.0–46.0)
HEMATOCRIT: 31.4 % — AB (ref 36.0–46.0)
HEMOGLOBIN: 10.3 g/dL — AB (ref 12.0–15.0)
Hemoglobin: 11.3 g/dL — ABNORMAL LOW (ref 12.0–15.0)
MCH: 27.3 pg (ref 26.0–34.0)
MCH: 27.5 pg (ref 26.0–34.0)
MCHC: 32.8 g/dL (ref 30.0–36.0)
MCHC: 32.8 g/dL (ref 30.0–36.0)
MCV: 83.3 fL (ref 78.0–100.0)
MCV: 83.7 fL (ref 78.0–100.0)
PLATELETS: 356 10*3/uL (ref 150–400)
Platelets: 286 10*3/uL (ref 150–400)
RBC: 4.14 MIL/uL (ref 3.87–5.11)
RBC: 3.75 MIL/uL — ABNORMAL LOW (ref 3.87–5.11)
RDW: 14.9 % (ref 11.5–15.5)
RDW: 15 % (ref 11.5–15.5)
WBC: 13.5 10*3/uL — ABNORMAL HIGH (ref 4.0–10.5)
WBC: 18.6 10*3/uL — ABNORMAL HIGH (ref 4.0–10.5)

## 2016-03-01 LAB — TYPE AND SCREEN
ABO/RH(D): A POS
Antibody Screen: NEGATIVE

## 2016-03-01 LAB — RPR: RPR: NONREACTIVE

## 2016-03-01 LAB — URIC ACID: Uric Acid, Serum: 5.4 mg/dL (ref 2.3–6.6)

## 2016-03-01 MED ORDER — SOD CITRATE-CITRIC ACID 500-334 MG/5ML PO SOLN: 30.0000 mL | ORAL | Status: DC | PRN

## 2016-03-01 MED ORDER — DIPHENHYDRAMINE HCL 50 MG/ML IJ SOLN: 12.5000 mg | INTRAMUSCULAR | Status: DC | PRN

## 2016-03-01 MED ORDER — TERBUTALINE SULFATE 1 MG/ML IJ SOLN
0.2500 mg | Freq: Once | INTRAMUSCULAR | Status: DC | PRN
  Filled 2016-03-01: qty 1

## 2016-03-01 MED ORDER — PHENYLEPHRINE 40 MCG/ML (10ML) SYRINGE FOR IV PUSH (FOR BLOOD PRESSURE SUPPORT)
80.0000 ug | PREFILLED_SYRINGE | INTRAVENOUS | Status: DC | PRN
  Filled 2016-03-01: qty 5

## 2016-03-01 MED ORDER — BENZOCAINE-MENTHOL 20-0.5 % EX AERO
1.0000 "application " | INHALATION_SPRAY | CUTANEOUS | Status: DC | PRN
  Administered 2016-03-01: 1 via TOPICAL
  Filled 2016-03-01: qty 56

## 2016-03-01 MED ORDER — OXYCODONE-ACETAMINOPHEN 5-325 MG PO TABS: 2.0000 | ORAL_TABLET | ORAL | Status: DC | PRN

## 2016-03-01 MED ORDER — IBUPROFEN 600 MG PO TABS
600.0000 mg | ORAL_TABLET | Freq: Four times a day (QID) | ORAL | Status: DC
  Administered 2016-03-01 – 2016-03-03 (×8): 600 mg via ORAL
  Filled 2016-03-01 (×8): qty 1

## 2016-03-01 MED ORDER — SENNOSIDES-DOCUSATE SODIUM 8.6-50 MG PO TABS
2.0000 | ORAL_TABLET | ORAL | Status: DC
  Administered 2016-03-01 – 2016-03-02 (×2): 2 via ORAL
  Filled 2016-03-01 (×2): qty 2

## 2016-03-01 MED ORDER — OXYTOCIN BOLUS FROM INFUSION: 500.0000 mL | Freq: Once | INTRAVENOUS | Status: DC

## 2016-03-01 MED ORDER — EPHEDRINE 5 MG/ML INJ
10.0000 mg | INTRAVENOUS | Status: DC | PRN
  Filled 2016-03-01: qty 4

## 2016-03-01 MED ORDER — FENTANYL CITRATE (PF) 100 MCG/2ML IJ SOLN
INTRAMUSCULAR | Status: AC
  Filled 2016-03-01: qty 2

## 2016-03-01 MED ORDER — FENTANYL 2.5 MCG/ML BUPIVACAINE 1/10 % EPIDURAL INFUSION (WH - ANES)
14.0000 mL/h | INTRAMUSCULAR | Status: DC | PRN
  Administered 2016-03-01: 14 mL/h via EPIDURAL
  Filled 2016-03-01: qty 100

## 2016-03-01 MED ORDER — ZOLPIDEM TARTRATE 5 MG PO TABS: 5.0000 mg | ORAL_TABLET | Freq: Every evening | ORAL | Status: DC | PRN

## 2016-03-01 MED ORDER — SIMETHICONE 80 MG PO CHEW: 80.0000 mg | CHEWABLE_TABLET | ORAL | Status: DC | PRN

## 2016-03-01 MED ORDER — FENTANYL CITRATE (PF) 100 MCG/2ML IJ SOLN
100.0000 ug | INTRAMUSCULAR | Status: DC | PRN
  Administered 2016-03-01: 100 ug via INTRAVENOUS

## 2016-03-01 MED ORDER — LACTATED RINGERS IV SOLN: 500.0000 mL | Freq: Once | INTRAVENOUS | Status: DC

## 2016-03-01 MED ORDER — DIPHENHYDRAMINE HCL 25 MG PO CAPS: 25.0000 mg | ORAL_CAPSULE | Freq: Four times a day (QID) | ORAL | Status: DC | PRN

## 2016-03-01 MED ORDER — ONDANSETRON HCL 4 MG/2ML IJ SOLN: 4.0000 mg | Freq: Four times a day (QID) | INTRAMUSCULAR | Status: DC | PRN

## 2016-03-01 MED ORDER — OXYCODONE-ACETAMINOPHEN 5-325 MG PO TABS
1.0000 | ORAL_TABLET | ORAL | Status: DC | PRN
  Administered 2016-03-03: 1 via ORAL
  Filled 2016-03-01: qty 1

## 2016-03-01 MED ORDER — ONDANSETRON HCL 4 MG PO TABS: 4.0000 mg | ORAL_TABLET | ORAL | Status: DC | PRN

## 2016-03-01 MED ORDER — PHENYLEPHRINE 40 MCG/ML (10ML) SYRINGE FOR IV PUSH (FOR BLOOD PRESSURE SUPPORT)
80.0000 ug | PREFILLED_SYRINGE | INTRAVENOUS | Status: DC | PRN
  Filled 2016-03-01: qty 5
  Filled 2016-03-01: qty 10

## 2016-03-01 MED ORDER — LACTATED RINGERS IV SOLN: 500.0000 mL | INTRAVENOUS | Status: DC | PRN

## 2016-03-01 MED ORDER — OXYTOCIN 40 UNITS IN LACTATED RINGERS INFUSION - SIMPLE MED
1.0000 m[IU]/min | INTRAVENOUS | Status: DC
  Administered 2016-03-01: 2 m[IU]/min via INTRAVENOUS
  Filled 2016-03-01: qty 1000

## 2016-03-01 MED ORDER — ONDANSETRON HCL 4 MG/2ML IJ SOLN: 4.0000 mg | INTRAMUSCULAR | Status: DC | PRN

## 2016-03-01 MED ORDER — DIBUCAINE 1 % RE OINT: 1.0000 "application " | TOPICAL_OINTMENT | RECTAL | Status: DC | PRN

## 2016-03-01 MED ORDER — OXYCODONE HCL 5 MG PO TABS
10.0000 mg | ORAL_TABLET | ORAL | Status: DC | PRN
  Administered 2016-03-01 – 2016-03-02 (×2): 10 mg via ORAL
  Filled 2016-03-01 (×2): qty 2

## 2016-03-01 MED ORDER — COCONUT OIL OIL: 1.0000 "application " | TOPICAL_OIL | Status: DC | PRN

## 2016-03-01 MED ORDER — ACETAMINOPHEN 325 MG PO TABS
650.0000 mg | ORAL_TABLET | ORAL | Status: DC | PRN
  Administered 2016-03-02 – 2016-03-03 (×5): 650 mg via ORAL
  Filled 2016-03-01 (×5): qty 2

## 2016-03-01 MED ORDER — PRENATAL MULTIVITAMIN CH
1.0000 | ORAL_TABLET | Freq: Every day | ORAL | Status: DC
  Administered 2016-03-02 – 2016-03-03 (×2): 1 via ORAL
  Filled 2016-03-01 (×2): qty 1

## 2016-03-01 MED ORDER — FERROUS SULFATE 325 (65 FE) MG PO TABS
325.0000 mg | ORAL_TABLET | Freq: Two times a day (BID) | ORAL | Status: DC
  Administered 2016-03-01 – 2016-03-03 (×4): 325 mg via ORAL
  Filled 2016-03-01 (×4): qty 1

## 2016-03-01 MED ORDER — LIDOCAINE HCL (PF) 1 % IJ SOLN
INTRAMUSCULAR | Status: DC | PRN
  Administered 2016-03-01: 3 mL via EPIDURAL
  Administered 2016-03-01: 2 mL via EPIDURAL
  Administered 2016-03-01: 5 mL via EPIDURAL

## 2016-03-01 MED ORDER — LACTATED RINGERS IV SOLN: INTRAVENOUS | Status: DC

## 2016-03-01 MED ORDER — FLEET ENEMA 7-19 GM/118ML RE ENEM: 1.0000 | ENEMA | RECTAL | Status: DC | PRN

## 2016-03-01 MED ORDER — OXYCODONE HCL 5 MG PO TABS
5.0000 mg | ORAL_TABLET | ORAL | Status: DC | PRN
  Administered 2016-03-01 – 2016-03-03 (×7): 5 mg via ORAL
  Filled 2016-03-01 (×7): qty 1

## 2016-03-01 MED ORDER — WITCH HAZEL-GLYCERIN EX PADS: 1.0000 "application " | MEDICATED_PAD | CUTANEOUS | Status: DC | PRN

## 2016-03-01 MED ORDER — OXYTOCIN 40 UNITS IN LACTATED RINGERS INFUSION - SIMPLE MED: 2.5000 [IU]/h | INTRAVENOUS | Status: DC

## 2016-03-01 MED ORDER — LIDOCAINE HCL (PF) 1 % IJ SOLN
30.0000 mL | INTRAMUSCULAR | Status: DC | PRN
  Filled 2016-03-01: qty 30

## 2016-03-01 NOTE — Lactation Note (Signed)
This note was copied from a baby's chart. Lactation Consultation Note  Patient Name: Bethany Hanna [id number] Date: 03/01/2016 Reason for consult: Initial assessment   Initial assessment with mom of < 1 hour old infant in [place]. Infant STS with mom and cueing to feed. Mom reports her 34 yo would not latch and that they tried for a few days and stopped. She reports she has a history of using fertility treatments with conception of both children. She reports + breast changes with pregnancy. Mom reports she would like to BF this infant and will just see how it goes.   Assisted mom in latching infant to left breast in the laid back cross cradle hold. Mom with large compressible breasts and semi compressible areola with small diameter, short shaft nipples. Assisted mom in latching infant using the tea cup hold. Infant latched after a few tries, he was noted to have flanged lips, rhythmic suckles and intermittent swallows. Infant was still feeding when I left the room.   Showed mom how to hand express, no colostrum obtained at this time. Enc mom to hand express prior to latch and after latch to apply EBM to nipples. Mom voiced concerns several times that she cannot see what infant is transferring, discussed monitoring I/O, infant behavior and weights to determine is infant is getting enough. Discussed BF Basics, colostrum, milk coming to volume, cluster feeding, hand expression, positioning and pillow support  Enc mom to feed infant STS 8-12 x in 24 hours at first feeding cues. Feeding log given with instructions for use.   BF Resources Handout and [place] Brochure given, mom informed of IP/OP Services, BF Support Groups and [place] phone #. Enc mom to call out to desk for feeding assistance as needed.       Maternal Data Formula Feeding for Exclusion: No Has patient been taught Hand Expression?: Yes Does the patient have breastfeeding experience prior to this delivery?: Yes  Feeding Feeding Type:  Breast Fed Length of feed: 20 min (still feeding when I left the room)  LATCH Score/Interventions Latch: Grasps breast easily, tongue down, lips flanged, rhythmical sucking.  Audible Swallowing: A few with stimulation Intervention(s): Alternate breast massage;Hand expression;Skin to skin  Type of Nipple: Everted at rest and after stimulation (very small diameter)  Comfort (Breast/Nipple): Soft / non-tender     Hold (Positioning): Assistance needed to correctly position infant at breast and maintain latch.  LATCH Score: 8  Lactation Tools Discussed/Used WIC Program: No   Consult Status Consult Status: Follow-up Date: 03/02/16 Follow-up type: In-patient    Debby Freiberg Demarkus Remmel 03/01/2016, 3:52 PM

## 2016-03-01 NOTE — H&P (Signed)
Bethany Hanna is a 35 y.o. female presenting for SROM at term. OB History    Gravida Para Term Preterm AB Living   2 1 1     1    SAB TAB Ectopic Multiple Live Births         0 1     History reviewed. No pertinent past medical history. Past Surgical History:  Procedure Laterality Date  . CHOLECYSTECTOMY N/A 04/09/2015   Procedure: LAPAROSCOPIC CHOLECYSTECTOMY WITH INTRAOPERATIVE CHOLANGIOGRAM;  Surgeon: Autumn Messing III, MD;  Location: [facility];  Service: General;  Laterality: N/A;  . DILATATION & CURRETTAGE/HYSTEROSCOPY WITH RESECTOCOPE N/A 07/31/2013   Procedure: [name];  Surgeon: Princess Bruins, MD;  Location: [facility];  Service: Gynecology;  Laterality: N/A;  1 hr.   Family History: family history includes Hypertension in her father. Social History:  reports that she has never smoked. She has never used smokeless tobacco. She reports that she drinks alcohol. She reports that she does not use drugs.     Maternal Diabetes: No Genetic Screening: Normal Maternal Ultrasounds/Referrals: Normal Fetal Ultrasounds or other Referrals:  None Maternal Substance Abuse:  No Significant Maternal Medications:  None Significant Maternal Lab Results:  None Other Comments:  None  Review of Systems  Constitutional: Negative.   All other systems reviewed and are negative.  Maternal Medical History:  Reason for admission: Rupture of membranes.   Contractions: Onset was less than 1 hour ago.   Perceived severity is mild.    Fetal activity: Perceived fetal activity is normal.   Last perceived fetal movement was within the past hour.    Prenatal complications: no prenatal complications Prenatal Complications - Diabetes: none.    Dilation: 2.5 Effacement (%): 60, 70 Station: -2 Exam by:: Tia Masker, RN Blood pressure 138/89, pulse 87, temperature 98.2 F (36.8 C), temperature source Oral, resp. rate 18, height 5\' 8"  (1.727 m), weight 96.6  kg (213 lb), last menstrual period 02/16/2015, unknown if currently breastfeeding. Maternal Exam:  Uterine Assessment: Contraction strength is mild.  Contraction frequency is regular.   Abdomen: Patient reports no abdominal tenderness. Fetal presentation: vertex  Introitus: Normal vulva. Normal vagina.  Ferning test: positive.  Nitrazine test: positive. Amniotic fluid character: clear.  Cervix: Cervix evaluated by digital exam.     Physical Exam  Nursing note and vitals reviewed. Constitutional: She is oriented to person, place, and time. She appears well-developed and well-nourished.  HENT:  Head: Normocephalic and atraumatic.  Neck: Normal range of motion. Neck supple.  Cardiovascular: Normal rate and regular rhythm.   Respiratory: Effort normal and breath sounds normal.  GI: Soft. Bowel sounds are normal.  Genitourinary: Vagina normal and uterus normal.  Neurological: She is alert and oriented to person, place, and time. She has normal reflexes.  Skin: Skin is warm and dry.  Psychiatric: She has a normal mood and affect.    Prenatal labs: ABO, Rh: --/--/A POS (12/27 0310) Antibody: NEG (12/27 0310) Rubella:   RPR:    HBsAg:    HIV:    GBS:     Assessment/Plan: Term IUP with SROM Augment prn   Bethany Hanna J 03/01/2016, 6:13 AM

## 2016-03-01 NOTE — Anesthesia Preprocedure Evaluation (Signed)
Anesthesia Evaluation  Patient identified by MRN, date of birth, ID band Patient awake    Reviewed: Allergy & Precautions, NPO status , Patient's Chart, lab work & pertinent test results  Airway Mallampati: II  TM Distance: >3 FB Neck ROM: Full    Dental  (+) Teeth Intact, Dental Advisory Given   Pulmonary neg pulmonary ROS,    Pulmonary exam normal breath sounds clear to auscultation       Cardiovascular negative cardio ROS Normal cardiovascular exam Rhythm:Regular Rate:Normal     Neuro/Psych negative neurological ROS     GI/Hepatic negative GI ROS, Neg liver ROS,   Endo/Other  Obesity   Renal/GU negative Renal ROS     Musculoskeletal negative musculoskeletal ROS (+)   Abdominal   Peds  Hematology negative hematology ROS (+) Blood dyscrasia, anemia , Plt 356k   Anesthesia Other Findings Day of surgery medications reviewed with the patient.  Reproductive/Obstetrics (+) Pregnancy                             Anesthesia Physical Anesthesia Plan  ASA: II  Anesthesia Plan: Epidural   Post-op Pain Management:    Induction:   Airway Management Planned:   Additional Equipment:   Intra-op Plan:   Post-operative Plan:   Informed Consent: I have reviewed the patients History and Physical, chart, labs and discussed the procedure including the risks, benefits and alternatives for the proposed anesthesia with the patient or authorized representative who has indicated his/her understanding and acceptance.   Dental advisory given  Plan Discussed with:   Anesthesia Plan Comments: (Patient identified. Risks/Benefits/Options discussed with patient including but not limited to bleeding, infection, nerve damage, paralysis, failed block, incomplete pain control, headache, blood pressure changes, nausea, vomiting, reactions to medication both or allergic, itching and postpartum back pain.  Confirmed with bedside nurse the patient's most recent platelet count. Confirmed with patient that they are not currently taking any anticoagulation, have any bleeding history or any family history of bleeding disorders. Patient expressed understanding and wished to proceed. All questions were answered. )        Anesthesia Quick Evaluation

## 2016-03-01 NOTE — Anesthesia Pain Management Evaluation Note (Signed)
  CRNA Pain Management Visit Note  Patient: Bethany Hanna, 35 y.o., female  "Hello I am a member of the anesthesia team at [facility]. We have an anesthesia team available at all times to provide care throughout the hospital, including epidural management and anesthesia for C-section. I don't know your plan for the delivery whether it a natural birth, water birth, IV sedation, nitrous supplementation, doula or epidural, but we want to meet your pain goals."   1.Was your pain managed to your expectations on prior hospitalizations?   Yes   2.What is your expectation for pain management during this hospitalization?     Epidural  3.How can we help you reach that goal?   Record the patient's initial score and the patient's pain goal.   Pain: 6  Pain Goal: 7 [facility] wants you to be able to say your pain was always managed very well.  Jabier Mutton 03/01/2016

## 2016-03-01 NOTE — MAU Note (Signed)
PT  SAYS SROM AT 0130.   DR MODY - VE  LAST  Thursday - 2  CM.    DENIES HSV AND MRSA.   GBS- NEG

## 2016-03-01 NOTE — Anesthesia Procedure Notes (Signed)
Epidural Patient location during procedure: OB Start time: 03/01/2016 8:15 AM End time: 03/01/2016 8:19 AM  Staffing Anesthesiologist: Catalina Gravel Performed: anesthesiologist   Preanesthetic Checklist Completed: patient identified, pre-op evaluation, timeout performed, IV checked, risks and benefits discussed and monitors and equipment checked  Epidural Patient position: sitting Prep: DuraPrep Patient monitoring: blood pressure and continuous pulse ox Approach: midline Location: L3-L4 Injection technique: LOR air  Needle:  Needle type: Tuohy  Needle gauge: 17 G Needle length: 9 cm Needle insertion depth: 4 cm Catheter size: 19 Gauge Catheter at skin depth: 9 cm Test dose: negative and Other (1% Lidocaine)  Additional Notes Patient identified.  Risk benefits discussed including failed block, incomplete pain control, headache, nerve damage, paralysis, blood pressure changes, nausea, vomiting, reactions to medication both toxic or allergic, and postpartum back pain.  Patient expressed understanding and wished to proceed.  All questions were answered.  Sterile technique used throughout procedure and epidural site dressed with sterile barrier dressing. No paresthesia or other complications noted. The patient did not experience any signs of intravascular injection such as tinnitus or metallic taste in mouth nor signs of intrathecal spread such as rapid motor block. Please see nursing notes for vital signs. Reason for block:procedure for pain

## 2016-03-01 NOTE — MAU Note (Signed)
Pt reports ROM at 0130, occasional uc's

## 2016-03-02 ENCOUNTER — Encounter (HOSPITAL_COMMUNITY): Payer: Self-pay | Admitting: Obstetrics and Gynecology

## 2016-03-02 LAB — CBC
HCT: 28.3 % — ABNORMAL LOW (ref 36.0–46.0)
Hemoglobin: 9.5 g/dL — ABNORMAL LOW (ref 12.0–15.0)
MCH: 28 pg (ref 26.0–34.0)
MCHC: 33.6 g/dL (ref 30.0–36.0)
MCV: 83.5 fL (ref 78.0–100.0)
PLATELETS: 278 10*3/uL (ref 150–400)
RBC: 3.39 MIL/uL — AB (ref 3.87–5.11)
RDW: 15.2 % (ref 11.5–15.5)
WBC: 15.6 10*3/uL — AB (ref 4.0–10.5)

## 2016-03-02 NOTE — Lactation Note (Addendum)
This note was copied from a baby's chart. Lactation Consultation Note  Patient Name: Boy Vantasia Signs [id number] Date: 03/02/2016 Reason for consult: Follow-up assessment Mom reports baby is nursing well, denies questions/concerns. Mom reports using Clomid 1 time and then conceived this pregnancy and reports good breast changes this pregnancy. Discussed with Mom nurse baby frequently, if not having breast changes with milk production by 3-4 day, consider post pumping for 15 minutes after feedings to maximize milk production. Baby asleep STS on Mom.  Mom will call if she would like assist with latch.   Maternal Data    Feeding Feeding Type: Breast Fed Length of feed: 15 min  LATCH Score/Interventions                      Lactation Tools Discussed/Used     Consult Status Consult Status: PRN Date: 03/03/16 Follow-up type: In-patient    Katrine Coho 03/02/2016, 1:52 PM

## 2016-03-02 NOTE — Anesthesia Postprocedure Evaluation (Signed)
Anesthesia Post Note  Patient: JAPERA WAHLS  Procedure(s) Performed: * No procedures listed *  Patient location during evaluation: Mother Baby Anesthesia Type: Epidural Level of consciousness: awake and alert Pain management: pain level controlled Vital Signs Assessment: post-procedure vital signs reviewed and stable Respiratory status: spontaneous breathing Cardiovascular status: blood pressure returned to baseline Postop Assessment: no headache, patient able to bend at knees, no backache, no signs of nausea or vomiting, epidural receding and adequate PO intake Anesthetic complications: no        Last Vitals:  Vitals:   03/02/16 0300 03/02/16 0626  BP:  108/73  Pulse:  84  Resp: 18 18  Temp: 36.5 C 36.7 C    Last Pain:  Vitals:   03/02/16 0630  TempSrc:   PainSc: 4    Pain Goal: Patients Stated Pain Goal: 3 (03/02/16 0630)               Ailene Ards

## 2016-03-02 NOTE — Progress Notes (Signed)
Patient ID: Bethany Hanna, female   DOB: 11-Aug-1980, 35 y.o.   MRN: [id number] PPD # 1 SVD with Second Degree Perineal Laceration  S:  Reports feeling very sore in bottom. In good spirits.             Tolerating po/ No nausea or vomiting             Bleeding is moderate             Pain controlled with ibuprofen (OTC) and narcotic analgesics including Percocet             Up ad lib / ambulatory / voiding without difficulties     Information for the patient's newborn:  Nolene, Stahl [id number]  female "[name]"   breast feeding well, "much better than my first son" / Circumcision in progress   O:  A & O x 3, in no apparent distress              VS:  Vitals:   03/01/16 1750 03/01/16 2200 03/02/16 0300 03/02/16 0626  BP: 113/78 118/72  108/73  Pulse: 92 87  84  Resp: 16 18 18 18   Temp: 98.6 F (37 C) 98.5 F (36.9 C) 97.7 F (36.5 C) 98 F (36.7 C)  TempSrc: Oral Oral Oral Oral  SpO2:    98%  Weight:      Height:        LABS:   Recent Labs  03/01/16 1657 03/02/16 0545  WBC 18.6* 15.6*  HGB 10.3* 9.5*  HCT 31.4* 28.3*  PLT 286 278    Blood type: --/--/A POS (12/27 0310)  Rubella: Immune (06/20 0000)   I&O: I/O last 3 completed shifts: In: -  Out: 450 [Urine:200; Blood:250]          No intake/output data recorded.    Abdomen: soft, non-tender, non-distended             Fundus: firm, non-tender, U-1  Perineum: 2nd degree repair healing well, no edema  Lochia: minimal  Extremities: No edema, no calf pain or tenderness    A/P: PPD # 1 35 y.o., [id number]   Principal Problem:    Postpartum care following vaginal delivery (12/27)  Active Problems:    Second-degree perineal laceration, with delivery    Doing well - stable status  Routine post partum orders  Anticipate discharge tomorrow    Laury Deep, M, MSN, CNM 03/02/2016, 8:55 AM

## 2016-03-03 DIAGNOSIS — O9902 Anemia complicating childbirth: Secondary | ICD-10-CM | POA: Diagnosis present

## 2016-03-03 MED ORDER — WITCH HAZEL-GLYCERIN EX PADS: 1.0000 "application " | MEDICATED_PAD | CUTANEOUS | 12 refills | Status: DC | PRN

## 2016-03-03 MED ORDER — BENZOCAINE-MENTHOL 20-0.5 % EX AERO: 1.0000 "application " | INHALATION_SPRAY | CUTANEOUS | Status: DC | PRN

## 2016-03-03 MED ORDER — DIBUCAINE 1 % RE OINT: 1.0000 "application " | TOPICAL_OINTMENT | RECTAL | 0 refills | Status: DC | PRN

## 2016-03-03 MED ORDER — FERROUS SULFATE 325 (65 FE) MG PO TABS: 325.0000 mg | ORAL_TABLET | Freq: Two times a day (BID) | ORAL | 3 refills | Status: DC

## 2016-03-03 MED ORDER — COCONUT OIL OIL: 1.0000 "application " | TOPICAL_OIL | 0 refills | Status: DC | PRN

## 2016-03-03 MED ORDER — IBUPROFEN 600 MG PO TABS: 600.0000 mg | ORAL_TABLET | Freq: Four times a day (QID) | ORAL | 0 refills | Status: DC

## 2016-03-03 MED ORDER — OXYCODONE HCL 10 MG PO TABS: 10.0000 mg | ORAL_TABLET | ORAL | 0 refills | Status: DC | PRN

## 2016-03-03 NOTE — Progress Notes (Signed)
Post Partum Day #2           Information for the patient's newborn:  Caysie, Kolis [id number]  female  circumcision completed Baby name: [name] Feeding: breast and bottle  Subjective: No HA, SOB, CP, F/C, breast symptoms. Pain controlled w/ NSAID and Percocet, desires rx for home narcotics first 2 days, states she "does not want to have any pain", had to use it with her last delivery and needs it again. Normal vaginal bleeding, no clots.      Objective:  VS:  Vitals:   03/02/16 0300 03/02/16 0626 03/03/16 0000 03/03/16 0550  BP:  108/73  107/69  Pulse:  84  73  Resp: 18 18 18 18   Temp: 97.7 F (36.5 C) 98 F (36.7 C)  97.9 F (36.6 C)  TempSrc: Oral Oral    SpO2:  98%    Weight:      Height:        No intake or output data in the 24 hours ending 03/03/16 0926     Recent Labs  03/01/16 1657 03/02/16 0545  WBC 18.6* 15.6*  HGB 10.3* 9.5*  HCT 31.4* 28.3*  PLT 286 278    Blood type: --/--/A POS (12/27 0310) Rubella: Immune (06/20 0000)    Physical Exam:  General: alert, cooperative and no distress Uterine Fundus: firm Lochia: appropriate Perineum: repair intact, small hemorrhoidal tags, edema minimal DVT Evaluation: No cords or calf tenderness. No significant calf/ankle edema.    Assessment/Plan: PPD # 2 / 35 y.o., [id number] S/P:spontaneous vaginal   Principal Problem:   Postpartum care following vaginal delivery (12/27) Active Problems:   SVD (spontaneous vaginal delivery) with 2nd degree repair   Anemia of mother in pregnancy, delivered    normal postpartum exam  Continue current postpartum care  D/C home   LOS: 2 days   Juliene Pina, CNM, MSN 03/03/2016, 9:26 AM

## 2016-03-03 NOTE — Discharge Summary (Signed)
Obstetric Discharge Summary Reason for Admission: onset of labor and rupture of membranes Prenatal Procedures: ultrasound Intrapartum Procedures: spontaneous vaginal delivery and epidural Postpartum Procedures: none Complications-Operative and Postpartum: 2nd degree perineal laceration Hemoglobin  Date Value Ref Range Status  03/02/2016 9.5 (L) 12.0 - 15.0 g/dL Final   HCT  Date Value Ref Range Status  03/02/2016 28.3 (L) 36.0 - 46.0 % Final    Physical Exam:  General: alert, cooperative and no distress Lochia: appropriate Uterine Fundus: firm Incision: healing well DVT Evaluation: No cords or calf tenderness. No significant calf/ankle edema.  Discharge Diagnoses: Term Pregnancy-delivered and anemia  Discharge Information: Date: 03/03/2016 Activity: pelvic rest Diet: routine Medications: PNV, Ibuprofen, Iron and Percocet Condition: stable Instructions: refer to practice specific booklet Discharge to: home Follow-up Information    Princess Bruins, MD. Schedule an appointment as soon as possible for a visit in 6 week(s).   Specialty:  Obstetrics and Gynecology Contact information: [place] [address] [phone]           Newborn Data: Live born female "[name]" Birth Weight: 8 lb 0.2 oz (3634 g) APGAR: 9, 9  Home with mother.  Juliene Pina 03/03/2016, 10:05 AM

## 2016-03-03 NOTE — Lactation Note (Signed)
This note was copied from a baby's chart. Lactation Consultation Note  Patient Name: Bethany Hanna [id number] Date: 03/03/2016 Reason for consult: Follow-up assessment Baby is 50 hours old - per mom breast feeding started off great after delivery and yesterday was circ'd  And then he wouldn't latch well. Started supplementing, and using the pacifier to calm him .  Per mom baby last was fed a bottle at 930 am 18 ml. Baby now is rooting, LC offered to assist to the breast,  And mom accepted the offer. LC changed a wet and stool. Assisted with latch in football right breast and guided  Mom to latch the baby with depth, swallows noted and increased with breast compressions. After 10 mins the baby came off.  Nipple well rounded. Shepherd laid the baby on moms chest and baby fell asleep.  Sore nipple and engorgement prevention and tx reviewed. Per mom has a DEBP at home.  Mother informed of post-discharge support and given phone number to the lactation department, including services for phone call assistance; out-patient appointments; and breastfeeding support group. List of other breastfeeding resources in the community given in the handout. Encouraged mother to call for problems or concerns related to breastfeeding.  Maternal Data Has patient been taught Hand Expression?: Yes  Feeding Feeding Type: Breast Fed Nipple Type: Slow - flow Length of feed: 10 min  LATCH Score/Interventions Latch: Grasps breast easily, tongue down, lips flanged, rhythmical sucking. Intervention(s): Adjust position;Assist with latch;Breast massage;Breast compression  Audible Swallowing: Spontaneous and intermittent  Type of Nipple: Everted at rest and after stimulation  Comfort (Breast/Nipple): Soft / non-tender     Hold (Positioning): Assistance needed to correctly position infant at breast and maintain latch. Intervention(s): Breastfeeding basics reviewed;Support Pillows;Position options;Skin to skin  LATCH  Score: 9  Lactation Tools Discussed/Used     Consult Status Consult Status: Complete Date: 03/03/16 Follow-up type: In-patient    [place] 03/03/2016, 11:24 AM

## 2016-04-03 ENCOUNTER — Ambulatory Visit: Payer: Managed Care, Other (non HMO) | Admitting: Family Medicine

## 2016-05-01 ENCOUNTER — Ambulatory Visit: Payer: BLUE CROSS/BLUE SHIELD | Admitting: Family Medicine

## 2016-06-13 ENCOUNTER — Encounter: Payer: Self-pay | Admitting: Obstetrics & Gynecology

## 2016-06-13 ENCOUNTER — Ambulatory Visit (INDEPENDENT_AMBULATORY_CARE_PROVIDER_SITE_OTHER): Payer: BLUE CROSS/BLUE SHIELD | Admitting: Obstetrics & Gynecology

## 2016-06-13 VITALS — BP 146/102 | Ht 67.5 in | Wt 201.0 lb

## 2016-06-13 DIAGNOSIS — N970 Female infertility associated with anovulation: Secondary | ICD-10-CM | POA: Diagnosis not present

## 2016-06-13 MED ORDER — CLOMIPHENE CITRATE 50 MG PO TABS: 50.0000 mg | ORAL_TABLET | Freq: Two times a day (BID) | ORAL | 0 refills | Status: AC

## 2016-06-13 NOTE — Patient Instructions (Signed)
Secondary anovulatory infertility.  Successful previously with Clomid 100 mg qd x day 3-7th.  Clomid same dosage prescribed.  Will attempt with timed intercourse next cycle.  LH urine ovulatory test or Progesterone day # 21 to confirm ovulation.  Come to [facility] office for SPT if late menses.

## 2016-06-13 NOTE — Progress Notes (Signed)
    Bethany Hanna 1981-02-09 [id number]        36 y.o.  [id number] presenting for consultation.  Goes by [employer].  Married, 2 healthy children.  [name] is 3+ mth old, SVD.  Not breastfeeding.  Would like to conceive again.  LMP wnl 06/02/16.  H/O Secondary Infertility associated with Anovulation overcame with Clomid 100 day 3-7th.   Past medical history,surgical history, problem list, medications, allergies, family history and social history were all reviewed and documented in the EPIC chart.  Directed ROS with pertinent positives and negatives documented in the history of present illness/assessment and plan.  Exam:  Vitals:   06/13/16 0840  BP: (!) 146/102  Weight: 201 lb (91.2 kg)  Height: 5' 7.5" (1.715 m)   General appearance:  Normal   Assessment/Plan:  36 y.o. [id number]   1. Secondary anovulatory infertility Will attempt Clomid 100 day 3-7th with next cycle.  Risk of Twin gestation 5-10 % discussed.  Will do LH urine or Progesterone day #21 to confirm ovulatory cycle.  Decision to proceed with timed IC this Clomid cycle.  If not successful, will consider adding [place] Follicle/Ovidrel/IUI Sperm wash.  F/U sPT if late menses.  Counseling >50% x 15 min on Secondary infertility d/t anovulation, treatment and risks.  Clomid 50 mg 2 tab PO day 3-7th #10 sent to pharmacy.    Princess Bruins MD, 9:05 AM 06/13/2016

## 2016-07-24 ENCOUNTER — Telehealth: Payer: Self-pay | Admitting: *Deleted

## 2016-07-24 NOTE — Telephone Encounter (Signed)
Pt called requesting refill on clomid, completed round in April, cycle started today told to call and refill would be prescribed. Please advise

## 2016-07-25 NOTE — Telephone Encounter (Signed)
07/26/16 would be day 3 of cycle, so patient would be able to start Rx.

## 2016-07-26 MED ORDER — CLOMIPHENE CITRATE 50 MG PO TABS: ORAL_TABLET | ORAL | 0 refills | Status: DC

## 2016-07-26 NOTE — Telephone Encounter (Signed)
Agree with represcription of Clomid 100 day 3-7th.  #10, no refill.

## 2016-07-26 NOTE — Telephone Encounter (Signed)
Left on voicemail Rx sent 

## 2016-08-17 ENCOUNTER — Telehealth: Payer: Self-pay | Admitting: *Deleted

## 2016-08-17 DIAGNOSIS — N970 Female infertility associated with anovulation: Secondary | ICD-10-CM

## 2016-08-17 MED ORDER — CLOMIPHENE CITRATE 50 MG PO TABS: ORAL_TABLET | ORAL | 0 refills | Status: DC

## 2016-08-17 NOTE — Telephone Encounter (Signed)
Yes, too late for Day #21 Progesterone this cycle.  Needs a neg HPT before starting the Clomid.  For this cycle, please prescribe Clomid same dose day #3-7th.

## 2016-08-17 NOTE — Telephone Encounter (Signed)
Pt called has tried clomid x 2 months, calling requesting refill, started spotting today, no flow yet. previous LMP:07/24/16.   Pt asked 1.) If refill could be provided, 2.) in past a lab has been drawn during as specific day of cycle, asked if this should be done this time? Please advise

## 2016-08-17 NOTE — Telephone Encounter (Signed)
Will relay to patient to check UPT, if negative proceed with clomid 50 mg twice daily day 3-7 of cycle. Should patient have progesterone level drawn day 21 for this cycle?

## 2016-08-18 NOTE — Telephone Encounter (Signed)
Yes, Progesterone day #21.

## 2016-08-21 NOTE — Telephone Encounter (Signed)
Left a detailed message on voicemail to schedule lab appointment, order placed.

## 2016-08-21 NOTE — Telephone Encounter (Signed)
Day before would be great, July 3rd.

## 2016-08-21 NOTE — Telephone Encounter (Signed)
Patient called back and said Day 59 will actually be July 4th. She asked if she should do it day before? Day after?

## 2016-08-21 NOTE — Telephone Encounter (Signed)
Patient informed. Lab appt made for 7/3.

## 2016-09-05 ENCOUNTER — Other Ambulatory Visit: Payer: BLUE CROSS/BLUE SHIELD

## 2016-09-05 DIAGNOSIS — N970 Female infertility associated with anovulation: Secondary | ICD-10-CM | POA: Diagnosis not present

## 2016-09-05 LAB — PROGESTERONE: PROGESTERONE: 13.1 ng/mL

## 2016-09-08 ENCOUNTER — Telehealth: Payer: Self-pay | Admitting: *Deleted

## 2016-09-08 MED ORDER — CLOMIPHENE CITRATE 50 MG PO TABS: ORAL_TABLET | ORAL | 0 refills | Status: DC

## 2016-09-08 NOTE — Telephone Encounter (Signed)
Pt called stating her cycle maybe due to start this weekend and would prefer Rx for clomid 50 mg twice daily day 3-7 of cycle, per Dr.Laovie result note on 09/05/16 "Ovulatory Progesterone. Same dosage of Clomiphen next cycle.  Rx sent, pt aware.

## 2016-10-05 ENCOUNTER — Telehealth: Payer: Self-pay | Admitting: *Deleted

## 2016-10-05 NOTE — Telephone Encounter (Signed)
Pt called requesting refill on clomid 50 mg tablet, cycle started today. This will be patient 4 th month on clomid. Okay to send Rx?

## 2016-10-05 NOTE — Telephone Encounter (Signed)
Yes agree to send Clomid prescription.  Remind patient that if not successful soon, max 6 cycles, should add IUI/Sperm wash...  Can add it now.

## 2016-10-06 MED ORDER — CLOMIPHENE CITRATE 50 MG PO TABS: ORAL_TABLET | ORAL | 0 refills | Status: DC

## 2016-10-06 NOTE — Telephone Encounter (Signed)
I talked with patient regarding cycle. LMP 10/05/16. I spoke to patient about IUI this cycle.  The patient wants to try one month without IUI and at Clomid 100mg . Dr Amedeo Plenty the 100mg  Clomid D3-7. Sent to pharmacy. The patient will follow up with me at end of cycle. KW CMA

## 2016-10-31 ENCOUNTER — Telehealth: Payer: Self-pay | Admitting: *Deleted

## 2016-10-31 NOTE — Telephone Encounter (Signed)
LMP:10/31/16

## 2016-10-31 NOTE — Telephone Encounter (Signed)
Patient called requesting refill for clomid 50 mg states she wants to try one more time before the IUI would be better for her and her husband to do IUI in Oct due to scheduling.  Okay to send Rx for Clomid?

## 2016-11-01 MED ORDER — CLOMIPHENE CITRATE 50 MG PO TABS: ORAL_TABLET | ORAL | 0 refills | Status: DC

## 2016-11-01 NOTE — Telephone Encounter (Signed)
Thanks. KW CMA

## 2016-11-01 NOTE — Telephone Encounter (Signed)
Yes please send Clomid same dosage.

## 2016-11-01 NOTE — Telephone Encounter (Signed)
Patient aware, Rx sent.   Bethany Hanna patient wanted me to relay this to you as well.

## 2016-11-27 ENCOUNTER — Telehealth: Payer: Self-pay | Admitting: *Deleted

## 2016-11-27 MED ORDER — CLOMIPHENE CITRATE 50 MG PO TABS: ORAL_TABLET | ORAL | 2 refills | Status: DC

## 2016-11-27 NOTE — Telephone Encounter (Signed)
Patient called requesting refill on clomid 50 mg twice daily day 3-7 of cycle. Today is day 3 of cycle. Pt said she would like to continue clomid for a couple more months if you agreed with this. And if no positive results then proceed with IUI. Please advise

## 2016-11-27 NOTE — Telephone Encounter (Signed)
Agree with refill of Clomid 100 mg day 3-7th.

## 2016-11-27 NOTE — Telephone Encounter (Signed)
Left message on pt voicemail Rx has been sent.

## 2017-02-13 ENCOUNTER — Other Ambulatory Visit: Payer: Self-pay | Admitting: Obstetrics & Gynecology

## 2017-02-13 NOTE — Telephone Encounter (Signed)
Patient said the plan had been for her to take this monthly and if not pregnant by the new year to reassess. She said she needs December refill.

## 2017-02-14 NOTE — Telephone Encounter (Signed)
rx called in [facility]

## 2017-02-14 NOTE — Telephone Encounter (Signed)
Patient informed. Rx sent 

## 2017-07-12 ENCOUNTER — Encounter: Payer: Self-pay | Admitting: Obstetrics & Gynecology

## 2017-07-12 ENCOUNTER — Ambulatory Visit (INDEPENDENT_AMBULATORY_CARE_PROVIDER_SITE_OTHER): Payer: BLUE CROSS/BLUE SHIELD | Admitting: Obstetrics & Gynecology

## 2017-07-12 ENCOUNTER — Other Ambulatory Visit: Payer: Self-pay | Admitting: Obstetrics & Gynecology

## 2017-07-12 VITALS — BP 144/92

## 2017-07-12 DIAGNOSIS — N970 Female infertility associated with anovulation: Secondary | ICD-10-CM

## 2017-07-12 MED ORDER — CHORIOGONADOTROPIN ALFA 250 MCG/0.5ML ~~LOC~~ INJ: 250.0000 ug | INJECTION | Freq: Once | SUBCUTANEOUS | 0 refills | Status: DC

## 2017-07-12 MED ORDER — CHORIOGONADOTROPIN ALFA 250 MCG/0.5ML ~~LOC~~ INJ: 250.0000 ug | INJECTION | Freq: Once | SUBCUTANEOUS | 0 refills | Status: AC

## 2017-07-12 MED ORDER — CLOMIPHENE CITRATE 50 MG PO TABS: ORAL_TABLET | ORAL | 0 refills | Status: DC

## 2017-07-12 NOTE — Progress Notes (Addendum)
    Bethany Hanna [birth year] [id number]       [name], 37 y.o.  [id number] Married.  2 healthy sons.  Bethany Hanna, is 56 month old.  RP: Secondary infertility x1 year  HPI: History of conceiving on Clomid.  At attempted Clomid stimulation with successful ovulation but no conception x6 months starting in April 2018.  After the Clomid stimulation cycles, had regular menses every month and continued to attempt conception without success.  Coming today to proceed with further treatment of secondary infertility.  No pelvic pain.  Normal vaginal secretions.   OB History  Gravida Para Term Preterm AB Living  2 2 2     2   SAB TAB Ectopic Multiple Live Births        0 2    # Outcome Date GA Lbr Len/2nd Weight Sex Delivery Anes PTL Lv  2 Term 03/01/16 [redacted]w[redacted]d 09:03 / 00:08 8 lb 0.2 oz (3.634 kg) M Vag-Spont EPI  LIV  1 Term 08/07/14 [redacted]w[redacted]d 211:50 / 01:55 8 lb 11 oz (3.941 kg) M Vag-Spont EPI  LIV    Past medical history,surgical history, problem list, medications, allergies, family history and social history were all reviewed and documented in the EPIC chart.   Directed ROS with pertinent positives and negatives documented in the history of present illness/assessment and plan.  Exam:  Vitals:   07/12/17 1509  BP: (!) 144/92   General appearance:  Normal  Gyn exam deferred   Assessment/Plan:  37 y.o. G2P2002   1. Secondary anovulatory infertility Further investigation of secondary infertility with hysterosalpingography and sperm analysis discussed with patient.  Decision made to postpone that investigation and proceed with it only if not successful after attempting intrauterine insemination for 3 cycles.  Decision to start back on the Clomid 100 mg daily 3 to 7 of the cycle as it was successfully producing ovulatory cycles in the past.  Patient will come back in office around day 12 of the cycle for an ultrasound follicle study.  We will then proceed with Ovidrel injection followed with  intra-uterine insemination with sperm wash.  Plan reviewed with patient who understands and agrees.  Other orders - clomiPHENE (CLOMID) 50 MG tablet; TAKE 1 TABLET BY MOUTH TWICE DAILY ON DAYS 3 TO 7 OF CYCLE - Choriogonadotropin Alfa 250 MCG/0.5ML injection; Inject 0.5 mLs (0.25 mg total) into the skin once for 1 dose.  Counseling on above issues and coordination of care more than 50% for 15 minutes.  Bethany Bruins MD, 3:19 PM 07/12/2017

## 2017-07-13 ENCOUNTER — Encounter: Payer: Self-pay | Admitting: Obstetrics & Gynecology

## 2017-07-13 NOTE — Patient Instructions (Signed)
1. Secondary anovulatory infertility Further investigation of secondary infertility with hysterosalpingography and sperm analysis discussed with patient.  Decision made to postpone that investigation and proceed with it only if not successful after attempting intrauterine insemination for 3 cycles.  Decision to start back on the Clomid 100 mg daily 3 to 7 of the cycle as it was successfully producing ovulatory cycles in the past.  Patient will come back in office around day 12 of the cycle for an ultrasound follicle study.  We will then proceed with Ovidrel injection followed with intra-uterine insemination with sperm wash.  Plan reviewed with patient who understands and agrees.  Other orders - clomiPHENE (CLOMID) 50 MG tablet; TAKE 1 TABLET BY MOUTH TWICE DAILY ON DAYS 3 TO 7 OF CYCLE - Choriogonadotropin Alfa 250 MCG/0.5ML injection; Inject 0.5 mLs (0.25 mg total) into the skin once for 1 dose.  [name], good seeing you today!

## 2017-07-27 ENCOUNTER — Other Ambulatory Visit: Payer: Self-pay | Admitting: *Deleted

## 2017-07-27 ENCOUNTER — Telehealth: Payer: Self-pay | Admitting: *Deleted

## 2017-07-27 DIAGNOSIS — N970 Female infertility associated with anovulation: Secondary | ICD-10-CM

## 2017-07-27 NOTE — Telephone Encounter (Signed)
Per note on 07/12/17 Rx called in for "Choriogonadotropin Alfa 250 MCG/0.5ML injection; Inject 0.5 mLs (0.25 mg total) into the skin once for 1 dose."

## 2017-08-07 ENCOUNTER — Ambulatory Visit (INDEPENDENT_AMBULATORY_CARE_PROVIDER_SITE_OTHER): Payer: BLUE CROSS/BLUE SHIELD

## 2017-08-07 ENCOUNTER — Ambulatory Visit: Payer: BLUE CROSS/BLUE SHIELD | Admitting: Obstetrics & Gynecology

## 2017-08-07 ENCOUNTER — Other Ambulatory Visit: Payer: Self-pay | Admitting: Obstetrics & Gynecology

## 2017-08-07 DIAGNOSIS — R9389 Abnormal findings on diagnostic imaging of other specified body structures: Secondary | ICD-10-CM

## 2017-08-07 DIAGNOSIS — N970 Female infertility associated with anovulation: Secondary | ICD-10-CM

## 2017-08-07 DIAGNOSIS — N979 Female infertility, unspecified: Secondary | ICD-10-CM

## 2017-08-07 NOTE — Progress Notes (Signed)
    Bethany Hanna 1981-02-06 [id number]        37 y.o.  G2P2002   RP: Day #12 of a Clomid cycle for follicle study  HPI: No pelvic pain.  No abnormal vaginal bleeding.   OB History  Gravida Para Term Preterm AB Living  2 2 2     2   SAB TAB Ectopic Multiple Live Births        0 2    # Outcome Date GA Lbr Len/2nd Weight Sex Delivery Anes PTL Lv  2 Term 03/01/16 [redacted]w[redacted]d 09:03 / 00:08 8 lb 0.2 oz (3.634 kg) M Vag-Spont EPI  LIV  1 Term 08/07/14 [redacted]w[redacted]d 211:50 / 01:55 8 lb 11 oz (3.941 kg) M Vag-Spont EPI  LIV    Past medical history,surgical history, problem list, medications, allergies, family history and social history were all reviewed and documented in the EPIC chart.   Directed ROS with pertinent positives and negatives documented in the history of present illness/assessment and plan.  Exam:  There were no vitals filed for this visit. General appearance:  Normal  Pelvic US today: T/V images.  Anteverted uterus measuring 9.45 x 6.3 x 4.93 cm.  Endometrium is measured at 14.1 mm.  Positive color flow Doppler to the endometrium, but no focus seen.  Right ovary with many follicles, largest is measured at 13.3 mm.  Left ovary with 4 follicles below 10 mm and 1 follicle at 20.3 mm.  No free fluid in the posterior cul-de-sac.   Assessment/Plan:  37 y.o. G2P2002   1. Secondary female infertility Clomid 100 mg day 3-7th on Day #12.  The 2 largest follicles measure 55.9 mm on the left ovary and 13.3 mm on the right ovary.  Will follow up with a follicle study again in 2 days. - US Transvaginal Non-OB; Future  2. Thickened endometrium If not pregnant this cycle, will schedule a Sonohystero after next period to r/o Endometrial Polyp. - [place] Sonohysterogram; Future  Counseling on above issues and coordination of care more than 50% for 15 minutes. Princess Bruins MD, 4:25 PM 08/07/2017

## 2017-08-09 ENCOUNTER — Encounter: Payer: Self-pay | Admitting: Obstetrics & Gynecology

## 2017-08-09 ENCOUNTER — Ambulatory Visit: Payer: BLUE CROSS/BLUE SHIELD

## 2017-08-09 ENCOUNTER — Ambulatory Visit: Payer: BLUE CROSS/BLUE SHIELD | Admitting: Obstetrics & Gynecology

## 2017-08-09 ENCOUNTER — Ambulatory Visit (INDEPENDENT_AMBULATORY_CARE_PROVIDER_SITE_OTHER): Payer: BLUE CROSS/BLUE SHIELD | Admitting: Obstetrics & Gynecology

## 2017-08-09 ENCOUNTER — Other Ambulatory Visit: Payer: BLUE CROSS/BLUE SHIELD

## 2017-08-09 VITALS — BP 140/92 | Temp 98.4°F

## 2017-08-09 DIAGNOSIS — N979 Female infertility, unspecified: Secondary | ICD-10-CM

## 2017-08-09 DIAGNOSIS — R9389 Abnormal findings on diagnostic imaging of other specified body structures: Secondary | ICD-10-CM

## 2017-08-09 NOTE — Patient Instructions (Signed)
1. Secondary female infertility Day #14 of a Clomid cycle.  Dominant follicle increased to 05.6 mm.  Will give herself the Ovidrel injection tomorrow.  Given the timing of ovulation during the weekend, patient prefers to have natural intercourse, rather than organize intrauterine insemination at the fertility clinic in [place].  Will be sexually active 24 hours after Ovidrel injection on Saturday and Sunday.  2. Thickened endometrium F/U in 3 weeks if not pregnant, after next period, for a Sonohystero to evaluate for a possible Endometrial Polyp.  Patient inform of the ultrasound finding and the possibility of a polyp.  Reassured that this is a low risk condition for pregnancy.  [name], good seeing you today!

## 2017-08-09 NOTE — Progress Notes (Signed)
    Bethany Hanna 01-02-1981 [id number]        37 y.o.  G2P2002   RP: Day #14 of Clomid cycle for ultrasound follicle study  HPI: Clomid cycle day #14.  Last ultrasound 2 days ago showed the 2 largest follicles at 13.0 mm on the left and 13.3 mm on the right.  No pelvic pain.  No abnormal bleeding.   OB History  Gravida Para Term Preterm AB Living  2 2 2     2   SAB TAB Ectopic Multiple Live Births        0 2    # Outcome Date GA Lbr Len/2nd Weight Sex Delivery Anes PTL Lv  2 Term 03/01/16 [redacted]w[redacted]d 09:03 / 00:08 8 lb 0.2 oz (3.634 kg) M Vag-Spont EPI  LIV  1 Term 08/07/14 [redacted]w[redacted]d 211:50 / 01:55 8 lb 11 oz (3.941 kg) M Vag-Spont EPI  LIV    Past medical history,surgical history, problem list, medications, allergies, family history and social history were all reviewed and documented in the EPIC chart.   Directed ROS with pertinent positives and negatives documented in the history of present illness/assessment and plan.  Exam:  Vitals:   08/09/17 1438  BP: (!) 140/92  Temp: 98.4 F (36.9 C)  TempSrc: Oral   General appearance:  Normal  Pelvic ultrasound today: T/V images.  Uterus anteverted homogeneous.  Endometrial lining tri-layered with continued presence of a thickened area at the fundus with color flow Doppler positive measuring 2.1 cm.  Dominant follicle on the left ovary measuring 16.3 mm.  Second largest follicle on the right ovary measuring 15.5 mm.  No apparent mass in the right or left adnexa.  No free fluid in the posterior cul-de-sac.   Assessment/Plan:  37 y.o. G2P2002   1. Secondary female infertility Day #14 of a Clomid cycle.  Dominant follicle increased to 86.5 mm.  Will give herself the Ovidrel injection tomorrow.  Given the timing of ovulation during the weekend, patient prefers to have natural intercourse, rather than organize intrauterine insemination at the fertility clinic in [place].  Will be sexually active 24 hours after Ovidrel injection on  Saturday and Sunday.  2. Thickened endometrium F/U in 3 weeks if not pregnant, after next period, for a Sonohystero to evaluate for a possible Endometrial Polyp.  Patient inform of the ultrasound finding and the possibility of a polyp.  Reassured that this is a low risk condition for pregnancy.  Counseling on above issues and coordination of care more than 50% for 15 minutes.  Princess Bruins MD, 3:56 PM 08/09/2017

## 2017-08-09 NOTE — Patient Instructions (Signed)
1. Secondary female infertility Clomid 100 mg day 3-7th on Day #12.  The 2 largest follicles measure 83.7 mm on the left ovary and 13.3 mm on the right ovary.  Will follow up with a follicle study again in 2 days. - US Transvaginal Non-OB; Future  2. Thickened endometrium If not pregnant this cycle, will schedule a Sonohystero after next period to r/o Endometrial Polyp. - [place] Sonohysterogram; Future  [name], good seeing you today!

## 2017-08-21 ENCOUNTER — Other Ambulatory Visit: Payer: BLUE CROSS/BLUE SHIELD

## 2017-08-21 ENCOUNTER — Ambulatory Visit: Payer: BLUE CROSS/BLUE SHIELD | Admitting: Obstetrics & Gynecology

## 2017-08-27 ENCOUNTER — Telehealth: Payer: Self-pay | Admitting: *Deleted

## 2017-08-27 NOTE — Telephone Encounter (Signed)
Patient called and left message in triage voicemail stating she took 2 UPT both positive, was scheduled for [facility] appointment, now canceled due to UPT, aware we no longer do OB asked if Dr.Lavoie can still see her I left a detailed message letting her know yes until ultrasound for viability, told pt to call and schedule OV first to confirm pregnancy.

## 2017-08-29 ENCOUNTER — Other Ambulatory Visit: Payer: BLUE CROSS/BLUE SHIELD

## 2017-08-29 ENCOUNTER — Ambulatory Visit: Payer: BLUE CROSS/BLUE SHIELD | Admitting: Obstetrics & Gynecology

## 2017-09-11 ENCOUNTER — Ambulatory Visit: Payer: BLUE CROSS/BLUE SHIELD | Admitting: Obstetrics & Gynecology

## 2017-09-11 ENCOUNTER — Other Ambulatory Visit: Payer: BLUE CROSS/BLUE SHIELD

## 2017-09-24 DIAGNOSIS — Z3201 Encounter for pregnancy test, result positive: Secondary | ICD-10-CM | POA: Diagnosis not present

## 2017-10-01 DIAGNOSIS — Z3689 Encounter for other specified antenatal screening: Secondary | ICD-10-CM | POA: Diagnosis not present

## 2017-10-01 DIAGNOSIS — O09521 Supervision of elderly multigravida, first trimester: Secondary | ICD-10-CM | POA: Diagnosis not present

## 2017-10-01 DIAGNOSIS — Z3A09 9 weeks gestation of pregnancy: Secondary | ICD-10-CM | POA: Diagnosis not present

## 2017-10-01 DIAGNOSIS — O219 Vomiting of pregnancy, unspecified: Secondary | ICD-10-CM | POA: Diagnosis not present

## 2017-10-08 DIAGNOSIS — Z369 Encounter for antenatal screening, unspecified: Secondary | ICD-10-CM | POA: Diagnosis not present

## 2017-10-08 DIAGNOSIS — Z3A10 10 weeks gestation of pregnancy: Secondary | ICD-10-CM | POA: Diagnosis not present

## 2017-10-08 DIAGNOSIS — O09521 Supervision of elderly multigravida, first trimester: Secondary | ICD-10-CM | POA: Diagnosis not present

## 2017-10-18 ENCOUNTER — Encounter: Payer: BLUE CROSS/BLUE SHIELD | Admitting: Obstetrics & Gynecology

## 2017-10-18 DIAGNOSIS — Z0289 Encounter for other administrative examinations: Secondary | ICD-10-CM

## 2017-10-31 DIAGNOSIS — O09521 Supervision of elderly multigravida, first trimester: Secondary | ICD-10-CM | POA: Diagnosis not present

## 2017-10-31 DIAGNOSIS — Z3A13 13 weeks gestation of pregnancy: Secondary | ICD-10-CM | POA: Diagnosis not present

## 2017-11-19 DIAGNOSIS — O09522 Supervision of elderly multigravida, second trimester: Secondary | ICD-10-CM | POA: Diagnosis not present

## 2017-11-19 DIAGNOSIS — Z3A16 16 weeks gestation of pregnancy: Secondary | ICD-10-CM | POA: Diagnosis not present

## 2017-12-06 ENCOUNTER — Other Ambulatory Visit: Payer: Self-pay | Admitting: Obstetrics and Gynecology

## 2017-12-06 DIAGNOSIS — O021 Missed abortion: Secondary | ICD-10-CM | POA: Diagnosis not present

## 2017-12-06 DIAGNOSIS — Z419 Encounter for procedure for purposes other than remedying health state, unspecified: Secondary | ICD-10-CM

## 2017-12-06 DIAGNOSIS — Z3480 Encounter for supervision of other normal pregnancy, unspecified trimester: Secondary | ICD-10-CM | POA: Diagnosis not present

## 2017-12-06 DIAGNOSIS — Z3A18 18 weeks gestation of pregnancy: Secondary | ICD-10-CM | POA: Diagnosis not present

## 2017-12-07 ENCOUNTER — Encounter (HOSPITAL_COMMUNITY): Payer: Self-pay | Admitting: *Deleted

## 2017-12-07 ENCOUNTER — Other Ambulatory Visit: Payer: Self-pay

## 2017-12-11 DIAGNOSIS — O021 Missed abortion: Secondary | ICD-10-CM | POA: Diagnosis not present

## 2017-12-11 DIAGNOSIS — J029 Acute pharyngitis, unspecified: Secondary | ICD-10-CM | POA: Diagnosis not present

## 2017-12-12 ENCOUNTER — Ambulatory Visit (HOSPITAL_COMMUNITY): Payer: 59

## 2017-12-12 ENCOUNTER — Ambulatory Visit (HOSPITAL_COMMUNITY)
Admission: AD | Admit: 2017-12-12 | Discharge: 2017-12-12 | Disposition: A | Discharge status: Home / Self Care | Payer: 59 | Source: Ambulatory Visit | Attending: Obstetrics and Gynecology | Admitting: Obstetrics and Gynecology

## 2017-12-12 ENCOUNTER — Encounter (HOSPITAL_COMMUNITY)
Admission: AD | Disposition: A | Discharge status: Home / Self Care | Payer: Self-pay | Source: Ambulatory Visit | Attending: Obstetrics and Gynecology

## 2017-12-12 ENCOUNTER — Ambulatory Visit (HOSPITAL_COMMUNITY): Payer: 59 | Admitting: Anesthesiology

## 2017-12-12 ENCOUNTER — Encounter (HOSPITAL_COMMUNITY): Payer: Self-pay | Admitting: Anesthesiology

## 2017-12-12 ENCOUNTER — Other Ambulatory Visit: Payer: Self-pay

## 2017-12-12 DIAGNOSIS — Z3A18 18 weeks gestation of pregnancy: Secondary | ICD-10-CM | POA: Insufficient documentation

## 2017-12-12 DIAGNOSIS — Z9049 Acquired absence of other specified parts of digestive tract: Secondary | ICD-10-CM | POA: Insufficient documentation

## 2017-12-12 DIAGNOSIS — O364XX0 Maternal care for intrauterine death, not applicable or unspecified: Secondary | ICD-10-CM | POA: Diagnosis present

## 2017-12-12 DIAGNOSIS — Z79899 Other long term (current) drug therapy: Secondary | ICD-10-CM | POA: Insufficient documentation

## 2017-12-12 DIAGNOSIS — O021 Missed abortion: Secondary | ICD-10-CM | POA: Diagnosis not present

## 2017-12-12 DIAGNOSIS — Z8249 Family history of ischemic heart disease and other diseases of the circulatory system: Secondary | ICD-10-CM | POA: Insufficient documentation

## 2017-12-12 HISTORY — PX: OPERATIVE ULTRASOUND: SHX5996

## 2017-12-12 HISTORY — PX: DILATION AND EVACUATION: SHX1459

## 2017-12-12 LAB — CBC
HCT: 37.8 % (ref 36.0–46.0)
Hemoglobin: 12.6 g/dL (ref 12.0–15.0)
MCH: 29.1 pg (ref 26.0–34.0)
MCHC: 33.3 g/dL (ref 30.0–36.0)
MCV: 87.3 fL (ref 80.0–100.0)
Platelets: 408 10*3/uL — ABNORMAL HIGH (ref 150–400)
RBC: 4.33 MIL/uL (ref 3.87–5.11)
RDW: 13.1 % (ref 11.5–15.5)
WBC: 15.9 10*3/uL — ABNORMAL HIGH (ref 4.0–10.5)
nRBC: 0 % (ref 0.0–0.2)

## 2017-12-12 LAB — TYPE AND SCREEN
ABO/RH(D): A POS
Antibody Screen: NEGATIVE

## 2017-12-12 SURGERY — DILATION AND EVACUATION, UTERUS, SECOND TRIMESTER
Anesthesia: General | Site: Vagina

## 2017-12-12 MED ORDER — LIDOCAINE HCL (CARDIAC) PF 100 MG/5ML IV SOSY
PREFILLED_SYRINGE | INTRAVENOUS | Status: AC
  Filled 2017-12-12: qty 5

## 2017-12-12 MED ORDER — MEPERIDINE HCL 25 MG/ML IJ SOLN: 6.2500 mg | INTRAMUSCULAR | Status: DC | PRN

## 2017-12-12 MED ORDER — CEFAZOLIN SODIUM-DEXTROSE 2-4 GM/100ML-% IV SOLN
2.0000 g | INTRAVENOUS | Status: AC
  Administered 2017-12-12: 2 g via INTRAVENOUS

## 2017-12-12 MED ORDER — ACETAMINOPHEN 500 MG PO TABS: 1000.0000 mg | ORAL_TABLET | Freq: Once | ORAL | Status: DC | PRN

## 2017-12-12 MED ORDER — KETOROLAC TROMETHAMINE 30 MG/ML IJ SOLN
INTRAMUSCULAR | Status: DC | PRN
  Administered 2017-12-12: 30 mg via INTRAVENOUS

## 2017-12-12 MED ORDER — GLYCOPYRROLATE 0.2 MG/ML IJ SOLN
INTRAMUSCULAR | Status: AC
  Filled 2017-12-12: qty 1

## 2017-12-12 MED ORDER — LIDOCAINE HCL (CARDIAC) PF 100 MG/5ML IV SOSY
PREFILLED_SYRINGE | INTRAVENOUS | Status: DC | PRN
  Administered 2017-12-12: 100 mg via INTRAVENOUS

## 2017-12-12 MED ORDER — FENTANYL CITRATE (PF) 100 MCG/2ML IJ SOLN
25.0000 ug | INTRAMUSCULAR | Status: DC | PRN
  Administered 2017-12-12 (×2): 50 ug via INTRAVENOUS

## 2017-12-12 MED ORDER — FENTANYL CITRATE (PF) 100 MCG/2ML IJ SOLN
INTRAMUSCULAR | Status: AC
  Administered 2017-12-12: 50 ug via INTRAVENOUS
  Filled 2017-12-12: qty 2

## 2017-12-12 MED ORDER — PROPOFOL 10 MG/ML IV BOLUS
INTRAVENOUS | Status: DC | PRN
  Administered 2017-12-12: 260 mg via INTRAVENOUS

## 2017-12-12 MED ORDER — MIDAZOLAM HCL 2 MG/2ML IJ SOLN
INTRAMUSCULAR | Status: DC | PRN
  Administered 2017-12-12: 2 mg via INTRAVENOUS
  Administered 2017-12-12 (×2): 1 mg via INTRAVENOUS

## 2017-12-12 MED ORDER — FENTANYL CITRATE (PF) 250 MCG/5ML IJ SOLN
INTRAMUSCULAR | Status: DC | PRN
  Administered 2017-12-12: 100 ug via INTRAVENOUS
  Administered 2017-12-12 (×4): 50 ug via INTRAVENOUS

## 2017-12-12 MED ORDER — ONDANSETRON HCL 4 MG/2ML IJ SOLN
INTRAMUSCULAR | Status: AC
  Filled 2017-12-12: qty 2

## 2017-12-12 MED ORDER — PROPOFOL 10 MG/ML IV BOLUS
INTRAVENOUS | Status: AC
  Filled 2017-12-12: qty 40

## 2017-12-12 MED ORDER — SCOPOLAMINE 1 MG/3DAYS TD PT72
MEDICATED_PATCH | TRANSDERMAL | Status: AC
  Administered 2017-12-12: 1.5 mg via TRANSDERMAL
  Filled 2017-12-12: qty 1

## 2017-12-12 MED ORDER — ACETAMINOPHEN 160 MG/5ML PO SOLN: 325.0000 mg | Freq: Once | ORAL | Status: DC | PRN

## 2017-12-12 MED ORDER — OXYCODONE-ACETAMINOPHEN 5-325 MG PO TABS: 1.0000 | ORAL_TABLET | ORAL | 0 refills | Status: DC | PRN

## 2017-12-12 MED ORDER — MIDAZOLAM HCL 2 MG/2ML IJ SOLN
INTRAMUSCULAR | Status: AC
  Filled 2017-12-12: qty 2

## 2017-12-12 MED ORDER — SCOPOLAMINE 1 MG/3DAYS TD PT72
1.0000 | MEDICATED_PATCH | Freq: Once | TRANSDERMAL | Status: DC
  Administered 2017-12-12: 1.5 mg via TRANSDERMAL

## 2017-12-12 MED ORDER — LACTATED RINGERS IV SOLN
INTRAVENOUS | Status: DC
  Administered 2017-12-12 (×2): 125 mL/h via INTRAVENOUS

## 2017-12-12 MED ORDER — DEXAMETHASONE SODIUM PHOSPHATE 10 MG/ML IJ SOLN
INTRAMUSCULAR | Status: DC | PRN
  Administered 2017-12-12: 10 mg via INTRAVENOUS

## 2017-12-12 MED ORDER — PROMETHAZINE HCL 25 MG/ML IJ SOLN: 6.2500 mg | INTRAMUSCULAR | Status: DC | PRN

## 2017-12-12 MED ORDER — ACETAMINOPHEN 10 MG/ML IV SOLN
1000.0000 mg | Freq: Once | INTRAVENOUS | Status: DC | PRN
  Administered 2017-12-12: 1000 mg via INTRAVENOUS

## 2017-12-12 MED ORDER — BUPIVACAINE HCL (PF) 0.25 % IJ SOLN
INTRAMUSCULAR | Status: AC
  Filled 2017-12-12: qty 30

## 2017-12-12 MED ORDER — ONDANSETRON HCL 4 MG/2ML IJ SOLN
INTRAMUSCULAR | Status: DC | PRN
  Administered 2017-12-12: 4 mg via INTRAVENOUS

## 2017-12-12 MED ORDER — DEXAMETHASONE SODIUM PHOSPHATE 10 MG/ML IJ SOLN
INTRAMUSCULAR | Status: AC
  Filled 2017-12-12: qty 1

## 2017-12-12 MED ORDER — ACETAMINOPHEN 10 MG/ML IV SOLN
INTRAVENOUS | Status: AC
  Administered 2017-12-12: 1000 mg via INTRAVENOUS
  Filled 2017-12-12: qty 100

## 2017-12-12 MED ORDER — BUPIVACAINE HCL (PF) 0.25 % IJ SOLN
INTRAMUSCULAR | Status: DC | PRN
  Administered 2017-12-12: 20 mL

## 2017-12-12 MED ORDER — MIDAZOLAM HCL 2 MG/2ML IJ SOLN: 0.5000 mg | Freq: Once | INTRAMUSCULAR | Status: DC | PRN

## 2017-12-12 MED ORDER — FENTANYL CITRATE (PF) 100 MCG/2ML IJ SOLN
INTRAMUSCULAR | Status: AC
  Filled 2017-12-12: qty 4

## 2017-12-12 SURGICAL SUPPLY — 15 items
CONT PATH 16OZ SNAP LID 3702 (MISCELLANEOUS) IMPLANT
GLOVE BIO SURGEON STRL SZ7.5 (GLOVE) ×3 IMPLANT
GLOVE BIOGEL PI IND STRL 7.0 (GLOVE) ×2 IMPLANT
GLOVE BIOGEL PI INDICATOR 7.0 (GLOVE) ×1
GOWN STRL REUS W/TWL LRG LVL3 (GOWN DISPOSABLE) ×6 IMPLANT
KIT BERKELEY 2ND TRIMESTER 1/2 (COLLECTOR) ×3 IMPLANT
NS IRRIG 1000ML POUR BTL (IV SOLUTION) ×3 IMPLANT
PACK VAGINAL MINOR WOMEN LF (CUSTOM PROCEDURE TRAY) ×3 IMPLANT
PAD OB MATERNITY 4.3X12.25 (PERSONAL CARE ITEMS) ×3 IMPLANT
PAD PREP 24X48 CUFFED NSTRL (MISCELLANEOUS) ×3 IMPLANT
TOWEL OR 17X24 6PK STRL BLUE (TOWEL DISPOSABLE) ×6 IMPLANT
TUBE VACURETTE 2ND TRIMESTER (CANNULA) ×3 IMPLANT
VACURETTE 12 RIGID CVD (CANNULA) IMPLANT
VACURETTE 14MM CVD 1/2 BASE (CANNULA) IMPLANT
VACURETTE 16MM ASPIR CVD .5 (CANNULA) ×1 IMPLANT

## 2017-12-12 NOTE — Op Note (Signed)
Bethany Hanna, Bethany Hanna MEDICAL RECORD [id number] ACCOUNT [id number] DATE OF BIRTH:1980/11/03 FACILITY: [facility] LOCATION: [facility] PHYSICIAN:Sidhant Helderman J. Benelli Winther, MD  OPERATIVE REPORT  DATE OF PROCEDURE:  12/12/2017  PREOPERATIVE DIAGNOSIS:  Unexplained intrauterine fetal demise at 18 weeks and 6 days.  POSTOPERATIVE DIAGNOSIS:  Unexplained intrauterine fetal demise at 18 weeks and 6 days.  PROCEDURE:  Late trimester dilatation and evacuation under ultrasound guidance.  SURGEON:  Brien Few, MD  ASSISTANT:  None.  ANESTHESIA:  General, local.  ESTIMATED BLOOD LOSS:  50 mL.  COMPLICATIONS:  None.  DRAINS:  None.  COUNTS:  Correct.  DISPOSITION:  The patient to recovery in good condition.  BRIEF OPERATIVE NOTE:  After being apprised of the risks of anesthesia, infection, bleeding, possible need for repair with uterine perforation, possible need for blood products, the patient was brought to the operating room, administered general  anesthetic without complications.  Prepped and draped in usual sterile fashion, catheterized until the bladder was empty.  Prior to prepping and draping the patient, please note that 4 x 4 gauze and 3 laminaria were removed.  At this time, cervix was  grasped using a ring forceps.  A dilute Marcaine solution 20 mL placed in standard paracervical block.  Cervix easily dilated up to a #48 Pratt dilator.  A 16 mm suction curette was placed.  Amniotic fluid is aspirated without difficulty and under  ultrasound guidance using Sopher forceps and ring forceps.  Fetal tissue was extracted completely including placental tissue, pieces of the fetal thigh, thorax and rib cage are sent for chromosomal analysis.  Operative ultrasound was used and cavity is empty.  Blunt curettage confirms cavity empty.  Procedure was terminated.  bumps, bruises noted.    The patient tolerated the procedure well, awakened and transferred to recovery in good  condition.  AN/NUANCE  D:12/12/2017 T:12/12/2017 JOB:[id number]

## 2017-12-12 NOTE — Anesthesia Postprocedure Evaluation (Signed)
Anesthesia Post Note  Patient: Bethany Hanna  Procedure(s) Performed: DILATATION AND EVACUATION (D&E) 2ND TRIMESTER (N/A Vagina ) OPERATIVE ULTRASOUND (N/A Abdomen)     Patient location during evaluation: PACU Anesthesia Type: General Level of consciousness: awake and alert Pain management: pain level controlled Vital Signs Assessment: post-procedure vital signs reviewed and stable Respiratory status: spontaneous breathing, nonlabored ventilation and respiratory function stable Cardiovascular status: blood pressure returned to baseline and stable Postop Assessment: no apparent nausea or vomiting Anesthetic complications: no    Last Vitals:  Vitals:   12/12/17 1245 12/12/17 1446  BP: (!) 142/105   Pulse: 98   Resp: 18   Temp: 36.8 C 36.6 C  SpO2: 99%     Last Pain:  Vitals:   12/12/17 1245  TempSrc: Oral  PainSc: 4    Pain Goal: Patients Stated Pain Goal: 3 (12/12/17 1245)               Brennan Bailey

## 2017-12-12 NOTE — H&P (Signed)
Bethany Hanna is an 37 y.o. female. History of IUFD for D&E.  Pertinent Gynecological History: Menses: flow is moderate Bleeding: na Contraception: none DES exposure: denies Blood transfusions: none Sexually transmitted diseases: no past history Previous GYN Procedures: na  Last mammogram: na Date: na Last pap: normal Date: 2019 OB History: G4, P2   Menstrual History: Menarche age: 11 Patient's last menstrual period was 07/27/2017.    Past Medical History:  Diagnosis Date  . Second-degree perineal laceration, with delivery 03/02/2016    Past Surgical History:  Procedure Laterality Date  . CHOLECYSTECTOMY N/A 04/09/2015   Procedure: LAPAROSCOPIC CHOLECYSTECTOMY WITH INTRAOPERATIVE CHOLANGIOGRAM;  Surgeon: Autumn Messing III, MD;  Location: [facility];  Service: General;  Laterality: N/A;  . DILATATION & CURRETTAGE/HYSTEROSCOPY WITH RESECTOCOPE N/A 07/31/2013   Procedure: [place];  Surgeon: Princess Bruins, MD;  Location: [facility];  Service: Gynecology;  Laterality: N/A;  1 hr.    Family History  Problem Relation Age of Onset  . Hypertension Father     Social History:  reports that she has never smoked. She has never used smokeless tobacco. She reports that she drinks alcohol. She reports that she does not use drugs.  Allergies: No Known Allergies  Medications Prior to Admission  Medication Sig Dispense Refill Last Dose  . acetaminophen (TYLENOL) 325 MG tablet Take 650 mg by mouth every 6 (six) hours as needed (headache.).   12/11/2017 at 1900  . ALPRAZolam (XANAX) 0.5 MG tablet Take 0.5 mg by mouth 3 (three) times daily as needed for anxiety.   12/12/2017 at [facility]  . Prenatal Vit-Fe Fumarate-FA (PRENATAL MULTIVITAMIN) TABS Take 1 tablet by mouth daily.   Past Week at Unknown time    Review of Systems  Constitutional: Negative.   All other systems reviewed and are negative.   Blood pressure (!) 142/105, pulse 98, temperature 98.3 F  (36.8 C), temperature source Oral, resp. rate 18, height 5' 8.5" (1.74 m), weight 89.8 kg, last menstrual period 07/27/2017, SpO2 99 %, not currently breastfeeding. Physical Exam  Nursing note and vitals reviewed. Constitutional: She is oriented to person, place, and time. She appears well-developed and well-nourished.  HENT:  Head: Normocephalic and atraumatic.  Neck: Normal range of motion. Neck supple.  Cardiovascular: Normal rate and regular rhythm.  Respiratory: Effort normal and breath sounds normal.  GI: Soft. Bowel sounds are normal.  Genitourinary: Vagina normal and uterus normal.  Musculoskeletal: Normal range of motion.  Neurological: She is alert and oriented to person, place, and time. She has normal reflexes.  Skin: Skin is warm and dry.  Psychiatric: She has a normal mood and affect.    Results for orders placed or performed during the hospital encounter of 12/12/17 (from the past 24 hour(s))  CBC     Status: Abnormal   Collection Time: 12/12/17 12:15 PM  Result Value Ref Range   WBC 15.9 (H) 4.0 - 10.5 K/uL   RBC 4.33 3.87 - 5.11 MIL/uL   Hemoglobin 12.6 12.0 - 15.0 g/dL   HCT 37.8 36.0 - 46.0 %   MCV 87.3 80.0 - 100.0 fL   MCH 29.1 26.0 - 34.0 pg   MCHC 33.3 30.0 - 36.0 g/dL   RDW 13.1 11.5 - 15.5 %   Platelets 408 (H) 150 - 400 K/uL   nRBC 0.0 0.0 - 0.2 %    No results found.  Assessment/Plan: IUFD at 18 weeks NL NIPS D&Ewith sono guidance Consent done.  Anvi Mangal J 12/12/2017,  12:51 PM

## 2017-12-12 NOTE — Anesthesia Procedure Notes (Signed)
Procedure Name: LMA Insertion Date/Time: 12/12/2017 2:10 PM Performed by: [place] Nyshaun Standage C, CRNA Pre-anesthesia Checklist: Patient identified, Emergency Drugs available, Suction available and Patient being monitored Patient Re-evaluated:Patient Re-evaluated prior to induction Oxygen Delivery Method: Circle system utilized Preoxygenation: Pre-oxygenation with 100% oxygen Induction Type: IV induction Ventilation: Mask ventilation without difficulty LMA: LMA inserted LMA Size: 4.0 Number of attempts: 1 Airway Equipment and Method: Bite block Placement Confirmation: positive ETCO2 Tube secured with: Tape Dental Injury: Teeth and Oropharynx as per pre-operative assessment

## 2017-12-12 NOTE — Transfer of Care (Signed)
Immediate Anesthesia Transfer of Care Note  Patient: Bethany Hanna  Procedure(s) Performed: DILATATION AND EVACUATION (D&E) 2ND TRIMESTER (N/A Vagina ) OPERATIVE ULTRASOUND (N/A Abdomen)  Patient Location: PACU  Anesthesia Type:General  Level of Consciousness: awake, alert  and oriented  Airway & Oxygen Therapy: Patient Spontanous Breathing and Patient connected to nasal cannula oxygen  Post-op Assessment: Report given to RN and Post -op Vital signs reviewed and stable  Post vital signs: Reviewed and stable  Last Vitals:  Vitals Value Taken Time  BP    Temp 36.6 C 12/12/2017  2:46 PM  Pulse 108 12/12/2017  2:51 PM  Resp 20 12/12/2017  2:51 PM  SpO2 99 % 12/12/2017  2:51 PM  Vitals shown include unvalidated device data.  Last Pain:  Vitals:   12/12/17 1245  TempSrc: Oral  PainSc: 4       Patients Stated Pain Goal: 3 (53/20/23 3435)  Complications: No apparent anesthesia complications

## 2017-12-12 NOTE — Progress Notes (Signed)
Patient ID: Bethany Hanna, female   DOB: [birth year], 37 y.o.   MRN: [id number] Patient seen and examined. Consent witnessed and signed. No changes noted. Update completed. CBC    Component Value Date/Time   WBC 15.9 (H) 12/12/2017 1215   RBC 4.33 12/12/2017 1215   HGB 12.6 12/12/2017 1215   HCT 37.8 12/12/2017 1215   PLT 408 (H) 12/12/2017 1215   MCV 87.3 12/12/2017 1215   MCV 91.6 01/19/2012 1716   MCH 29.1 12/12/2017 1215   MCHC 33.3 12/12/2017 1215   RDW 13.1 12/12/2017 1215   LYMPHSABS 1.8 04/08/2015 2224   MONOABS 1.0 04/08/2015 2224   EOSABS 0.0 04/08/2015 2224   BASOSABS 0.0 04/08/2015 2224

## 2017-12-12 NOTE — Anesthesia Preprocedure Evaluation (Addendum)
Anesthesia Evaluation  Patient identified by MRN, date of birth, ID band Patient awake    Reviewed: Allergy & Precautions, NPO status , Patient's Chart, lab work & pertinent test results  Airway Mallampati: II  TM Distance: >3 FB Neck ROM: Full    Dental no notable dental hx. (+) Teeth Intact, Dental Advisory Given   Pulmonary neg pulmonary ROS,    Pulmonary exam normal breath sounds clear to auscultation       Cardiovascular negative cardio ROS Normal cardiovascular exam Rhythm:Regular Rate:Normal     Neuro/Psych negative neurological ROS  negative psych ROS   GI/Hepatic negative GI ROS, Neg liver ROS,   Endo/Other  negative endocrine ROS  Renal/GU negative Renal ROS  negative genitourinary   Musculoskeletal negative musculoskeletal ROS (+)   Abdominal   Peds negative pediatric ROS (+)  Hematology negative hematology ROS (+)   Anesthesia Other Findings Day of surgery medications reviewed with the patient.  Reproductive/Obstetrics (+) Pregnancy 18w IUFD                            Anesthesia Physical Anesthesia Plan  ASA: II  Anesthesia Plan: General   Post-op Pain Management:    Induction: Intravenous  PONV Risk Score and Plan: 3 and Ondansetron, Dexamethasone, Midazolam, Treatment may vary due to age or medical condition and Scopolamine patch - Pre-op  Airway Management Planned: LMA  Additional Equipment:   Intra-op Plan:   Post-operative Plan: Extubation in OR  Informed Consent: I have reviewed the patients History and Physical, chart, labs and discussed the procedure including the risks, benefits and alternatives for the proposed anesthesia with the patient or authorized representative who has indicated his/her understanding and acceptance.   Dental advisory given  Plan Discussed with: CRNA and Surgeon  Anesthesia Plan Comments:        Anesthesia Quick  Evaluation

## 2017-12-12 NOTE — Discharge Instructions (Signed)

## 2017-12-12 NOTE — Op Note (Signed)
12/12/2017  2:32 PM  PATIENT:  Bethany Hanna  37 y.o. female  PRE-OPERATIVE DIAGNOSIS:  Intrauterine Fetal Demise 18wk 6days NL NIPS  POST-OPERATIVE DIAGNOSIS:  intrauterine fetal demise 18 weeks 6days  PROCEDURE:  Procedure(s): DILATATION AND EVACUATION (D&E) 2ND TRIMESTER OPERATIVE ULTRASOUND  SURGEON:  Surgeon(s): Brien Few, MD  ASSISTANTS: none   ANESTHESIA:   local and general  ESTIMATED BLOOD LOSS: minimal   DRAINS: none   LOCAL MEDICATIONS USED:  MARCAINE    and Amount: 20 ml  SPECIMEN:  Source of Specimen:  POC and tissue sent for chromosomes  DISPOSITION OF SPECIMEN:  PATHOLOGY  COUNTS:  YES  DICTATION #: [id number]  PLAN OF CARE: dc home  PATIENT DISPOSITION:  PACU - hemodynamically stable.

## 2017-12-13 ENCOUNTER — Encounter (HOSPITAL_COMMUNITY): Payer: Self-pay | Admitting: Obstetrics and Gynecology

## 2017-12-25 DIAGNOSIS — O021 Missed abortion: Secondary | ICD-10-CM | POA: Diagnosis not present

## 2017-12-31 LAB — TISSUE HYBRIDIZATION TO NCBH

## 2017-12-31 LAB — CHROMOSOME STD, POC(TISSUE)-NCBH

## 2018-01-08 DIAGNOSIS — N96 Recurrent pregnancy loss: Secondary | ICD-10-CM | POA: Diagnosis not present

## 2018-01-08 DIAGNOSIS — Z1329 Encounter for screening for other suspected endocrine disorder: Secondary | ICD-10-CM | POA: Diagnosis not present

## 2018-02-28 DIAGNOSIS — N970 Female infertility associated with anovulation: Secondary | ICD-10-CM | POA: Diagnosis not present

## 2018-03-05 ENCOUNTER — Other Ambulatory Visit: Payer: Self-pay | Admitting: Obstetrics & Gynecology

## 2018-04-19 DIAGNOSIS — Z6830 Body mass index (BMI) 30.0-30.9, adult: Secondary | ICD-10-CM | POA: Diagnosis not present

## 2018-04-19 DIAGNOSIS — Z01411 Encounter for gynecological examination (general) (routine) with abnormal findings: Secondary | ICD-10-CM | POA: Diagnosis not present

## 2018-04-19 DIAGNOSIS — N644 Mastodynia: Secondary | ICD-10-CM | POA: Diagnosis not present

## 2018-04-19 DIAGNOSIS — Z01419 Encounter for gynecological examination (general) (routine) without abnormal findings: Secondary | ICD-10-CM | POA: Diagnosis not present

## 2018-05-13 ENCOUNTER — Other Ambulatory Visit: Payer: Self-pay | Admitting: Radiology

## 2018-05-13 DIAGNOSIS — C50912 Malignant neoplasm of unspecified site of left female breast: Secondary | ICD-10-CM | POA: Diagnosis not present

## 2018-05-13 DIAGNOSIS — R599 Enlarged lymph nodes, unspecified: Secondary | ICD-10-CM | POA: Diagnosis not present

## 2018-05-13 DIAGNOSIS — N6459 Other signs and symptoms in breast: Secondary | ICD-10-CM | POA: Diagnosis not present

## 2018-05-13 DIAGNOSIS — N6321 Unspecified lump in the left breast, upper outer quadrant: Secondary | ICD-10-CM | POA: Diagnosis not present

## 2018-05-15 ENCOUNTER — Telehealth: Payer: Self-pay | Admitting: *Deleted

## 2018-05-15 NOTE — Telephone Encounter (Signed)
Scheduled and confirmed appt with Dr. Lindi Adie for 3.12.20 at [facility] instructions and directions.

## 2018-05-16 ENCOUNTER — Telehealth: Payer: Self-pay | Admitting: Hematology and Oncology

## 2018-05-16 ENCOUNTER — Other Ambulatory Visit: Payer: Self-pay | Admitting: General Surgery

## 2018-05-16 ENCOUNTER — Ambulatory Visit (HOSPITAL_BASED_OUTPATIENT_CLINIC_OR_DEPARTMENT_OTHER): Payer: 59 | Admitting: Hematology and Oncology

## 2018-05-16 ENCOUNTER — Encounter: Payer: Self-pay | Admitting: *Deleted

## 2018-05-16 VITALS — BP 151/90 | HR 105 | Temp 98.1°F | Resp 20 | Ht 68.5 in | Wt 206.8 lb

## 2018-05-16 DIAGNOSIS — C50412 Malignant neoplasm of upper-outer quadrant of left female breast: Secondary | ICD-10-CM | POA: Insufficient documentation

## 2018-05-16 DIAGNOSIS — C773 Secondary and unspecified malignant neoplasm of axilla and upper limb lymph nodes: Secondary | ICD-10-CM

## 2018-05-16 DIAGNOSIS — Z170 Estrogen receptor positive status [ER+]: Secondary | ICD-10-CM

## 2018-05-16 DIAGNOSIS — F419 Anxiety disorder, unspecified: Secondary | ICD-10-CM

## 2018-05-16 DIAGNOSIS — G4700 Insomnia, unspecified: Secondary | ICD-10-CM

## 2018-05-16 MED ORDER — PROCHLORPERAZINE MALEATE 10 MG PO TABS: 10.0000 mg | ORAL_TABLET | Freq: Four times a day (QID) | ORAL | 1 refills | Status: DC | PRN

## 2018-05-16 MED ORDER — LORAZEPAM 0.5 MG PO TABS: 0.5000 mg | ORAL_TABLET | Freq: Every evening | ORAL | 0 refills | Status: DC | PRN

## 2018-05-16 MED ORDER — ONDANSETRON HCL 8 MG PO TABS: 8.0000 mg | ORAL_TABLET | Freq: Two times a day (BID) | ORAL | 1 refills | Status: DC | PRN

## 2018-05-16 MED ORDER — LIDOCAINE-PRILOCAINE 2.5-2.5 % EX CREA: TOPICAL_CREAM | CUTANEOUS | 3 refills | Status: DC

## 2018-05-16 MED ORDER — DEXAMETHASONE 4 MG PO TABS: ORAL_TABLET | ORAL | 0 refills | Status: DC

## 2018-05-16 NOTE — Progress Notes (Signed)
[facility] CONSULT NOTE  Patient Care Team: Patient, No Pcp Per as PCP - General (General Practice)  CHIEF COMPLAINTS/PURPOSE OF CONSULTATION: Newly diagnosed breast cancer  HISTORY OF PRESENTING ILLNESS:  Bethany Hanna 38 y.o. 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 The cancer was detected on a diagnostic mammogram on 05/13/18 after the patient felt the lump, and found to measure 2cm by mammogram and 4cm by [place]. A biopsy from 05/13/18 showed the cancer to be grade 3 invasive mammary carcinoma of the left breast, ER/PR positive, HER2 negative, with several abnormal lymph nodes and one biopsied to be positive.   She presents to the clinic today with her husband and mother. She denies a family history of breast, ovarian, or pancreatic cancer. She has 2 kids, a 2yo and 3yo, and prior to her diagnosis was actively trying to concieve another child and was taking Clomid. She had a miscarriage in October at 19 weeks and the lump was found shortly after that and believed to be an enlarged duct until the recent mammogram. She reports anxiety about this diagnosis, which has caused difficulty sleeping, but is very proactive and motivated for treatment. 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 She reviewed her medication list with me.  I reviewed her records extensively and collaborated the history with the patient.  SUMMARY OF ONCOLOGIC HISTORY:   Malignant neoplasm of upper-outer quadrant of left breast in female, estrogen receptor positive ([place])   05/13/2018 Initial Diagnosis    Evaluation of 5 months of thickening of the left breast upper outer quadrant.  Initial mammogram revealed 2 cm irregular mass, ultrasound revealed 4 cm irregular mass with spiculated margin 2 o'clock position middle depth biopsy revealed invasive mammary carcinoma grade 3 that  was ER PR positive HER-2 negative, left axillary lymph node biopsy positive for breast cancer.  Several lymph nodes were identified by ultrasound.  T2 N1 stage IIb clinical stage    05/16/2018 Cancer Staging    Staging form: Breast, AJCC 8th Edition - Clinical stage from 05/16/2018: Stage IIB (cT2, cN1, cM0, G3, ER+, PR+, HER2-) - Signed by [name], MD on 05/16/2018      MEDICAL HISTORY:  Past Medical History:  Diagnosis Date  . Second-degree perineal laceration, with delivery 03/02/2016    SURGICAL HISTORY: Past Surgical History:  Procedure Laterality Date  . CHOLECYSTECTOMY N/A 04/09/2015   Procedure: LAPAROSCOPIC CHOLECYSTECTOMY WITH INTRAOPERATIVE CHOLANGIOGRAM;  Surgeon: Autumn Messing III, MD;  Location: [facility];  Service: General;  Laterality: N/A;  . DILATATION & CURRETTAGE/HYSTEROSCOPY WITH RESECTOCOPE N/A 07/31/2013   Procedure: [place];  Surgeon: Princess Bruins, MD;  Location: [facility];  Service: Gynecology;  Laterality: N/A;  1 hr.  Marland Kitchen DILATION AND EVACUATION N/A 12/12/2017   Procedure: DILATATION AND EVACUATION (D&E) 2ND TRIMESTER;  Surgeon: Brien Few, MD;  Location: [facility];  Service: Gynecology;  Laterality: N/A;  . OPERATIVE ULTRASOUND N/A 12/12/2017   Procedure: OPERATIVE ULTRASOUND;  Surgeon: Brien Few, MD;  Location: [facility];  Service: Gynecology;  Laterality: N/A;    SOCIAL HISTORY: Social History   Socioeconomic History  . Marital status: Married    Spouse name: Not on file  . Number of children: Not on file  . Years of education: Not on file  . Highest education level: Not on file  Occupational History  .  Not on file  Social Needs  . Financial resource strain: Not on file  . Food insecurity:    Worry: Not on file    Inability: Not on file  . Transportation needs:    Medical: Not on file    Non-medical: Not on file  Tobacco Use  . Smoking status: Never Smoker  . Smokeless tobacco: Never Used   Substance and Sexual Activity  . Alcohol use: Yes    Comment: social   . Drug use: No  . Sexual activity: Yes  Lifestyle  . Physical activity:    Days per week: Not on file    Minutes per session: Not on file  . Stress: Not on file  Relationships  . Social connections:    Talks on phone: Not on file    Gets together: Not on file    Attends religious service: Not on file    Active member of club or organization: Not on file    Attends meetings of clubs or organizations: Not on file    Relationship status: Not on file  . Intimate partner violence:    Fear of current or ex partner: Not on file    Emotionally abused: Not on file    Physically abused: Not on file    Forced sexual activity: Not on file  Other Topics Concern  . Not on file  Social History Narrative  . Not on file    FAMILY HISTORY: Family History  Problem Relation Age of Onset  . Hypertension Father     ALLERGIES:  has No Known Allergies.  MEDICATIONS:  Current Outpatient Medications  Medication Sig Dispense Refill  . acetaminophen (TYLENOL) 325 MG tablet Take 650 mg by mouth every 6 (six) hours as needed (headache.).    Marland Kitchen ALPRAZolam (XANAX) 0.5 MG tablet Take 0.5 mg by mouth 3 (three) times daily as needed for anxiety.    Marland Kitchen oxyCODONE-acetaminophen (PERCOCET/ROXICET) 5-325 MG tablet Take 1-2 tablets by mouth every 4 (four) hours as needed for severe pain. 20 tablet 0  . Prenatal Vit-Fe Fumarate-FA (PRENATAL MULTIVITAMIN) TABS Take 1 tablet by mouth daily.     No current facility-administered medications for this visit.     REVIEW OF SYSTEMS:   Constitutional: Denies fevers, chills or abnormal night sweats Eyes: Denies blurriness of vision, double vision or watery eyes Ears, nose, mouth, throat, and face: Denies mucositis or sore throat Respiratory: Denies cough, dyspnea or wheezes Cardiovascular: Denies palpitation, chest discomfort or lower extremity swelling Gastrointestinal:  Denies nausea,  heartburn or change in bowel habits Skin: Denies abnormal skin rashes Lymphatics: Denies new lymphadenopathy or easy bruising Neurological:Denies numbness, tingling or new weaknesses Behavioral/Psych: (+) anxiety (+) difficulty sleeping Breast: Denies any discharge (+) palpable lump in left breast All other systems were reviewed with the patient and are negative.  PHYSICAL EXAMINATION: ECOG PERFORMANCE STATUS: 1 - Symptomatic but completely ambulatory  Vitals:   05/16/18 1005  BP: (!) 151/90  Pulse: (!) 105  Resp: 20  Temp: 98.1 F (36.7 C)  SpO2: 100%   Filed Weights   05/16/18 1005  Weight: 206 lb 12.8 oz (93.8 kg)    GENERAL:alert, no distress and comfortable SKIN: skin color, texture, turgor are normal, no rashes or significant lesions EYES: normal, conjunctiva are pink and non-injected, sclera clear OROPHARYNX:no exudate, no erythema and lips, buccal mucosa, and tongue normal  NECK: supple, thyroid normal size, non-tender, without nodularity LYMPH:  no palpable lymphadenopathy in the cervical, axillary or inguinal LUNGS:  clear to auscultation and percussion with normal breathing effort HEART: regular rate & rhythm and no murmurs and no lower extremity edema ABDOMEN:abdomen soft, non-tender and normal bowel sounds Musculoskeletal:no cyanosis of digits and no clubbing  PSYCH: alert & oriented x 3 with fluent speech NEURO: no focal motor/sensory deficits  LABORATORY DATA:  I have reviewed the data as listed Lab Results  Component Value Date   WBC 15.9 (H) 12/12/2017   HGB 12.6 12/12/2017   HCT 37.8 12/12/2017   MCV 87.3 12/12/2017   PLT 408 (H) 12/12/2017   Lab Results  Component Value Date   NA 134 (L) 03/01/2016   K 3.4 (L) 03/01/2016   CL 105 03/01/2016   CO2 21 (L) 03/01/2016    RADIOGRAPHIC STUDIES: I have personally reviewed the radiological reports and agreed with the findings in the report.  ASSESSMENT AND PLAN:  Malignant neoplasm of upper-outer  quadrant of left breast in female, estrogen receptor positive ([place]) 05/13/2018:Evaluation of 5 months of thickening of the left breast upper outer quadrant.  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  Several lymph nodes were identified by ultrasound.  T2 N1 stage IIb clinical stage  Pathology and radiology counseling: Discussed with the patient, the details of pathology including the type of breast cancer,the clinical staging, the significance of ER, PR and HER-2/neu receptors and the implications for treatment. After reviewing the pathology in detail, we proceeded to discuss the different treatment options between surgery, radiation, chemotherapy, antiestrogen therapies.  Recommendation: 1.  Neoadjuvant chemotherapy with dose dense Adriamycin and Cytoxan followed by Taxol weekly x12 2.  Breast conserving surgery with targeted node dissection if possible 3.  Adjuvant radiation therapy 4.  Followed by adjuvant antiestrogen therapy  Chemotherapy Counseling: I discussed the risks and benefits of chemotherapy including the risks of nausea/ vomiting, risk of infection from low WBC count, fatigue due to chemo or anemia, bruising or bleeding due to low platelets, mouth sores, loss/ change in taste and decreased appetite. Liver and kidney function will be monitored through out chemotherapy as abnormalities in liver and kidney function may be a side effect of treatment. Cardiac dysfunction due to Adriamycin was discussed in detail. Risk of permanent bone marrow dysfunction and leukemia due to chemo were also discussed.  Plan: 1.  Port placement 2.  Echocardiogram 3.  Chemo class 4.  Staging scans 5.  Breast MRI Start chemotherapy in 2 weeks.     All  questions were answered. The patient knows to call the clinic with any problems, questions or concerns.  [name], MD 05/16/2018   I, Cloyde Reams Dorshimer, am acting as scribe for [name], MD.  I have reviewed the above documentation for accuracy and completeness, and I agree with the above.

## 2018-05-16 NOTE — Assessment & Plan Note (Addendum)
05/13/2018:Evaluation of 5 months of thickening of the left breast upper outer quadrant.  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  Several lymph nodes were identified by ultrasound.  T2 N1 stage IIb clinical stage  Pathology and radiology counseling: Discussed with the patient, the details of pathology including the type of breast cancer,the clinical staging, the significance of ER, PR and HER-2/neu receptors and the implications for treatment. After reviewing the pathology in detail, we proceeded to discuss the different treatment options between surgery, radiation, chemotherapy, antiestrogen therapies.  Recommendation: 1.  Neoadjuvant chemotherapy with dose dense Adriamycin and Cytoxan followed by Taxol weekly x12 2.  Breast conserving surgery with targeted node dissection if possible 3.  Adjuvant radiation therapy 4.  Followed by adjuvant antiestrogen therapy  Chemotherapy Counseling: I discussed the risks and benefits of chemotherapy including the risks of nausea/ vomiting, risk of infection from low WBC count, fatigue due to chemo or anemia, bruising or bleeding due to low platelets, mouth sores, loss/ change in taste and decreased appetite. Liver and kidney function will be monitored through out chemotherapy as abnormalities in liver and kidney function may be a side effect of treatment. Cardiac dysfunction due to Adriamycin was discussed in detail. Risk of permanent bone marrow dysfunction and leukemia due to chemo were also discussed.  Plan: 1.  Port placement 2.  Echocardiogram 3.  Chemo class 4.  Staging scans 5.  Breast MRI Start chemotherapy in 2 weeks.

## 2018-05-16 NOTE — Progress Notes (Signed)
START ON PATHWAY REGIMEN - Breast   Dose-Dense AC q14 days:   A cycle is every 14 days:     Doxorubicin      Cyclophosphamide      Pegfilgrastim-xxxx   **Always confirm dose/schedule in your pharmacy ordering system**  Paclitaxel 80 mg/m2 Weekly:   Administer weekly:     Paclitaxel   **Always confirm dose/schedule in your pharmacy ordering system**  Patient Characteristics: Preoperative or Nonsurgical Candidate (Clinical Staging), Neoadjuvant Therapy followed by Surgery, Invasive Disease, Chemotherapy, HER2 Negative/Unknown/Equivocal, ER Positive Therapeutic Status: Preoperative or Nonsurgical Candidate (Clinical Staging) AJCC M Category: cM0 AJCC Grade: G3 Breast Surgical Plan: Neoadjuvant Therapy followed by Surgery ER Status: Positive (+) AJCC 8 Stage Grouping: IIB HER2 Status: Negative (-) AJCC T Category: cT2 AJCC N Category: cN1 PR Status: Positive (+) Intent of Therapy: Curative Intent, Discussed with Patient 

## 2018-05-16 NOTE — Telephone Encounter (Signed)
Scheduled appt per 3/12 sch message - pt aware of appts added . Will get an updated schedule at chemo edu

## 2018-05-21 ENCOUNTER — Telehealth: Payer: Self-pay | Admitting: Family Medicine

## 2018-05-21 ENCOUNTER — Other Ambulatory Visit: Payer: Self-pay | Admitting: Hematology and Oncology

## 2018-05-21 ENCOUNTER — Encounter: Payer: Self-pay | Admitting: Pharmacist

## 2018-05-21 NOTE — Telephone Encounter (Signed)
See note  Copied from [facility] #[id number]. Topic: General - Other >> May 21, 2018  9:40 AM Keene Breath wrote: Reason for CRM: Patient called to speak with Dr. Ansel Bong assistant, Tillie Rung.  Patient stated that she was told by her dad, William Hamburger, Dr. Ansel Bong patient, that the doctor or his nurse would call her regarding a referral she needs for her scheduled breast surgery next week.  Patient is not currently a patient of Dr. Ansel Bong, but she stated that Dr. Yong Channel told her dad that he would take care of it.  Please call patient to clarify as soon as possible.  CB# Cell - [phone] or dad's # [phone]

## 2018-05-22 ENCOUNTER — Ambulatory Visit
Admission: RE | Admit: 2018-05-22 | Discharge: 2018-05-22 | Disposition: A | Discharge status: Home / Self Care | Payer: 59 | Source: Ambulatory Visit | Attending: Hematology and Oncology | Admitting: Hematology and Oncology

## 2018-05-22 ENCOUNTER — Encounter: Payer: Self-pay | Admitting: Family Medicine

## 2018-05-22 ENCOUNTER — Telehealth: Payer: Self-pay

## 2018-05-22 ENCOUNTER — Other Ambulatory Visit: Payer: Self-pay

## 2018-05-22 ENCOUNTER — Other Ambulatory Visit: Payer: Self-pay | Admitting: Hematology and Oncology

## 2018-05-22 ENCOUNTER — Ambulatory Visit (INDEPENDENT_AMBULATORY_CARE_PROVIDER_SITE_OTHER): Payer: 59 | Admitting: Family Medicine

## 2018-05-22 ENCOUNTER — Encounter (HOSPITAL_BASED_OUTPATIENT_CLINIC_OR_DEPARTMENT_OTHER): Payer: Self-pay | Admitting: *Deleted

## 2018-05-22 VITALS — BP 146/92 | HR 91 | Temp 98.1°F | Ht 68.0 in | Wt 202.0 lb

## 2018-05-22 DIAGNOSIS — C50412 Malignant neoplasm of upper-outer quadrant of left female breast: Secondary | ICD-10-CM | POA: Diagnosis not present

## 2018-05-22 DIAGNOSIS — R030 Elevated blood-pressure reading, without diagnosis of hypertension: Secondary | ICD-10-CM | POA: Diagnosis not present

## 2018-05-22 DIAGNOSIS — Z170 Estrogen receptor positive status [ER+]: Secondary | ICD-10-CM | POA: Diagnosis not present

## 2018-05-22 MED ORDER — GADOBUTROL 1 MMOL/ML IV SOLN
9.0000 mL | Freq: Once | INTRAVENOUS | Status: AC | PRN
  Administered 2018-05-22: 9 mL via INTRAVENOUS

## 2018-05-22 NOTE — Assessment & Plan Note (Signed)
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  She does not remember diastolic number being high.  She states blood pressures have been very high over last few weeks as she has been worked up for the breast cancer.  She does want to work on her weight once things settle down- would like to exercise more and improve diet A/P: I strongly suspect this is white coat syndrome without hypertension-we are going to confirm this by getting some home monitoring done.  Can consider bringing cuff to next visit.  Strongly suspect higher blood pressures are related to stress of breast cancer and navigating the healthcare system.  If blood pressure is in fact high-I do believe a trial of diet and exercise would be reasonable given how young she is

## 2018-05-22 NOTE — Telephone Encounter (Signed)
Bethany Hanna with [facility]  Pt stated that she has been in contact with Janit Pagan to se if she has a PCP so that they can get the pt surgery in place. [name] also stated that the pt has [employer] navigate insurance. [name] stated that she is getting her information ready to be billed and really needs pt PCP set up. [name] was advised that I will call pt to set her up and give her a date when pt has been put into the system with Dr. Yong Channel as her PCP.    After speaking to pt, she stated that she cannot be seen tomorrow due to having an appt that may take all day. Dr. Yong Channel was able to see pt today.   [name] will be notified. No further action needed at this time.

## 2018-05-22 NOTE — Progress Notes (Signed)
Phone: [phone]   Subjective:  Patient presents today to establish care.  Prior patient - has only had OB. Chief complaint-noted.   See problem oriented charting  The following were reviewed and entered/updated in epic: Past Medical History:  Diagnosis Date  . Cancer ([place]) 05/05/2018   Breast caner  . Second-degree perineal laceration, with delivery 03/02/2016   Patient Active Problem List   Diagnosis Date Noted  . Malignant neoplasm of upper-outer quadrant of left breast in female, estrogen receptor positive ([place]) 05/16/2018    Priority: High  . White coat syndrome without hypertension 05/22/2018    Priority: Medium   Past Surgical History:  Procedure Laterality Date  . CHOLECYSTECTOMY N/A 04/09/2015   Procedure: LAPAROSCOPIC CHOLECYSTECTOMY WITH INTRAOPERATIVE CHOLANGIOGRAM;  Surgeon: Autumn Messing III, MD;  Location: [facility];  Service: General;  Laterality: N/A;  . DILATATION & CURRETTAGE/HYSTEROSCOPY WITH RESECTOCOPE N/A 07/31/2013   Procedure: [place];  Surgeon: Princess Bruins, MD;  Location: [facility];  Service: Gynecology;  Laterality: N/A;  1 hr.  Marland Kitchen DILATION AND EVACUATION N/A 12/12/2017   Procedure: DILATATION AND EVACUATION (D&E) 2ND TRIMESTER;  Surgeon: Brien Few, MD;  Location: [facility];  Service: Gynecology;  Laterality: N/A;  . OPERATIVE ULTRASOUND N/A 12/12/2017   Procedure: OPERATIVE ULTRASOUND;  Surgeon: Brien Few, MD;  Location: [facility];  Service: Gynecology;  Laterality: N/A;    Family History  Problem Relation Age of Onset  . Arthritis Mother   . Migraines Mother   . ADD / ADHD Mother   . Varicose Veins Mother   . Hypertension Father   . Alcohol abuse Brother   . Varicose Veins Brother   . Stroke Maternal Grandmother   . Pulmonary fibrosis Maternal Grandfather   . Mitral valve prolapse Paternal Grandmother   . Migraines Paternal Grandmother   . Lymphoma Paternal Grandfather        was treated for this  and did ok  . Heart failure Paternal Grandfather   . Eczema Son     Medications- reviewed and updated Current Outpatient Medications  Medication Sig Dispense Refill  . acetaminophen (TYLENOL) 500 MG tablet Take 1,000 mg by mouth every 6 (six) hours as needed for moderate pain.     Marland Kitchen ALPRAZolam (XANAX) 0.5 MG tablet Take 0.5 mg by mouth at bedtime as needed for anxiety.     Marland Kitchen dexamethasone (DECADRON) 4 MG tablet Take 1 tablet day after chemo and 1 tablet 2 days after chemo with food 8 tablet 0  . lidocaine-prilocaine (EMLA) cream Apply to affected area once 30 g 3  . LORazepam (ATIVAN) 0.5 MG tablet Take 1 tablet (0.5 mg total) by mouth at bedtime as needed (Nausea or vomiting). 30 tablet 0  . ondansetron (ZOFRAN) 8 MG tablet Take 1 tablet (8 mg total) by mouth 2 (two) times daily as needed. Start on the third day after chemotherapy. 30 tablet 1  . oxyCODONE-acetaminophen (PERCOCET/ROXICET) 5-325 MG tablet Take 1-2 tablets by mouth every 4 (four) hours as needed for severe pain. 20 tablet 0  . Prenatal Vit-Fe Fumarate-FA (PRENATAL MULTIVITAMIN) TABS Take 1 tablet by mouth daily.    . prochlorperazine (COMPAZINE) 10 MG tablet Take 1 tablet (10 mg total) by mouth every 6 (six) hours as needed (Nausea or vomiting). 30 tablet 1  . traMADol (ULTRAM) 50 MG tablet Take 50 mg by mouth every 6 (six) hours as needed for moderate pain.     No current facility-administered medications for this visit.  Allergies-reviewed and updated No Known Allergies  Social History   Social History Narrative   Married- see husband Catalina [place]- have 6 and 28 year old in 2020.       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  HENT: Negative for hearing loss.   Eyes: Negative for blurred vision and double  vision.  Respiratory: Negative for cough and hemoptysis.   Cardiovascular: Positive for palpitations (WITH ANXIETY). Negative for chest pain.  Gastrointestinal: Negative for heartburn and nausea.  Genitourinary: Negative for dysuria and urgency.  Musculoskeletal: Negative for myalgias and neck pain.  Skin: Negative for itching and rash.  Neurological: Negative for dizziness and headaches.  Endo/Heme/Allergies: Negative for polydipsia. Does not bruise/bleed easily.  Psychiatric/Behavioral: Negative for hallucinations (DUE TO RECENT CANCER DIAGNOSIS) and substance abuse. The patient is nervous/anxious.    Objective  Objective:  BP (!) 146/92   Pulse 91   Temp 98.1 F (36.7 C) (Oral)   Ht 5\' 8"  (1.727 m)   Wt 202 lb (91.6 kg)   LMP 07/27/2017   SpO2 98%   BMI 30.71 kg/m  Gen: NAD, resting comfortably HEENT: Mucous membranes are moist. Oropharynx normal. TM normal. Eyes: sclera and lids normal, PERRLA Neck: no thyromegaly, no cervical lymphadenopathy CV: RRR no murmurs rubs or gallops Lungs: CTAB no crackles, wheeze, rhonchi Abdomen: soft/nontender/nondistended/normal bowel sounds. No rebound or guarding.  Ext: no edema Skin: warm, dry Neuro: 5/5 strength in upper and lower extremities, normal gait, normal reflexes Psych: Appears anxious Blood pressure remains high on repeat today.   Assessment and Plan:   Malignant neoplasm of upper-outer quadrant of left breast in female, estrogen receptor positive ([place]) S: Patient recently diagnosed with breast cancer of the left breast.  Has upcoming surgery and chemotherapy planned.  Unfortunately her insurance requires a referral from a primary care doctor and would not use her gynecologist-thus we had to do a rush to get her in for a visit today.   A/P: Patient is doing well-does have some high stress with this diagnosis but overall managing well.  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  She does not remember diastolic number being high.  She states blood pressures have been very high over last few weeks as she has been worked up for the breast cancer.  She does want to work on her weight once things settle down- would like to exercise more and improve diet A/P: I strongly suspect this is white coat syndrome without hypertension-we are going to confirm this by getting some home monitoring done.  Can consider bringing cuff to next visit.  Strongly suspect higher blood pressures are related to stress of breast cancer and navigating the healthcare system.  If blood pressure is in fact high-I do believe a trial of diet and exercise would be reasonable given how young she is  Other notes: 1.  As above wants to work on weight once things settle down. Fertility treatments 2014 and 2015 and loosened up on diet at that time.  She thinks she can reverse this trend. 2.  Unfortunately experienced miscarriage October 2019-this is been  very difficult on her 3.  Covid-19 counseling provided-patient and patient's mother asked multiple questions which I counseled to the best of my ability  Future Appointments  Date Time Provider [place]  05/23/2018  9:00 AM [facility]-NM INJ 1 [facility]-NM [place]  05/23/2018 11:00 AM [facility]- ECHO 1-RUTH [facility]CARDUS [facility]  05/23/2018 12:00 PM [facility]-NM 1 [facility]-NM [place]  05/23/2018  2:30 PM [facility]-CT 2 [facility]-CT [place]  05/23/2018  4:00 PM [facility] CHEMO EDU [facility] None  05/24/2018 10:00 AM [facility] PAT 3 [facility]-SDSC None  05/28/2018  8:00 AM [facility] LAB 5 [facility] None  05/28/2018  8:15 AM [facility] [place] FLUSH [facility] None  05/28/2018  8:30 AM [name], MD [facility] None  05/28/2018  9:30 AM [facility] INFUSION [facility] None  05/30/2018 12:30 PM [facility] [place] FLUSH [facility] None  06/04/2018  2:30 PM [facility] LAB 5  [facility] None  06/04/2018  2:45 PM [facility] [place] FLUSH [facility] None  06/04/2018  3:15 PM [name], MD [facility] None  06/11/2018 10:00 AM [facility] LAB 5 [facility] None  06/11/2018 10:15 AM [facility] [place] FLUSH [facility] None  06/11/2018 11:00 AM [facility] INFUSION [facility] None  06/25/2018  9:45 AM [facility] LAB 5 [facility] None  06/25/2018 10:00 AM [facility] [place] FLUSH [facility] None  06/25/2018 10:30 AM [name], MD [facility] None  06/25/2018 11:30 AM [facility] INFUSION [facility] None  07/09/2018  9:30 AM [facility] LAB 5 [facility] None  07/09/2018  9:45 AM [facility] [place] FLUSH [facility] None  07/09/2018 10:15 AM [name], MD [facility] None  07/09/2018 11:30 AM [facility] INFUSION [facility] None   Lab/Order associations: Malignant neoplasm of upper-outer quadrant of left breast in female, estrogen receptor positive ([place]) - Plan: Ambulatory referral to General Surgery  White coat syndrome without hypertension   Time Stamp The duration of face-to-face time during this visit was greater than 30 minutes. Greater than 50% of this time was spent in counseling, explanation of diagnosis, planning of further management, and/or coordination of care including discussing breast cancer diagnosis, blood pressure goals and monitoring, weight management, current covid-19 environment.    Return precautions advised. Garret Reddish, MD

## 2018-05-22 NOTE — Patient Instructions (Addendum)
Your blood pressure trend concerns me. I would like for you to buy/use a home cuff to check at least 3-4x a week. Your goal is <140/90. Message me in about 2-3 weeks to let me know how home #s are looking.   Referral placed- entered this as stat- call us if you need anything and haven't heard by tomorrow  I am going to have team defer you flu shot to next year

## 2018-05-22 NOTE — Telephone Encounter (Signed)
Patient calling back, she is requesting a call back today, if at all possible as she is scheduled for surgery on Monday 3/23. She states that she can make appointment to see Dr. Yong Channel if needed. Please advise.

## 2018-05-22 NOTE — Telephone Encounter (Signed)
This has been taking care of. P thas been seen. No further action needed at this time.

## 2018-05-22 NOTE — Assessment & Plan Note (Signed)
S: Patient recently diagnosed with breast cancer of the left breast.  Has upcoming surgery and chemotherapy planned.  Unfortunately her insurance requires a referral from a primary care doctor and would not use her gynecologist-thus we had to do a rush to get her in for a visit today.   A/P: Patient is doing well-does have some high stress with this diagnosis but overall managing well.  We placed a referral to [facility] surgery today

## 2018-05-23 ENCOUNTER — Ambulatory Visit (HOSPITAL_BASED_OUTPATIENT_CLINIC_OR_DEPARTMENT_OTHER)
Admission: RE | Admit: 2018-05-23 | Discharge: 2018-05-23 | Disposition: A | Discharge status: Home / Self Care | Payer: 59 | Source: Ambulatory Visit | Attending: Hematology and Oncology | Admitting: Hematology and Oncology

## 2018-05-23 ENCOUNTER — Encounter (HOSPITAL_COMMUNITY): Payer: Self-pay

## 2018-05-23 ENCOUNTER — Encounter (HOSPITAL_COMMUNITY)
Admission: RE | Admit: 2018-05-23 | Discharge: 2018-05-23 | Disposition: A | Discharge status: Home / Self Care | Payer: 59 | Source: Ambulatory Visit | Attending: Hematology and Oncology | Admitting: Hematology and Oncology

## 2018-05-23 ENCOUNTER — Inpatient Hospital Stay: Payer: 59

## 2018-05-23 ENCOUNTER — Ambulatory Visit (HOSPITAL_COMMUNITY)
Admission: RE | Admit: 2018-05-23 | Discharge: 2018-05-23 | Disposition: A | Discharge status: Home / Self Care | Payer: 59 | Source: Ambulatory Visit | Attending: Hematology and Oncology | Admitting: Hematology and Oncology

## 2018-05-23 ENCOUNTER — Inpatient Hospital Stay: Payer: 59 | Attending: Hematology and Oncology

## 2018-05-23 ENCOUNTER — Other Ambulatory Visit: Payer: Self-pay

## 2018-05-23 DIAGNOSIS — C773 Secondary and unspecified malignant neoplasm of axilla and upper limb lymph nodes: Secondary | ICD-10-CM | POA: Insufficient documentation

## 2018-05-23 DIAGNOSIS — Z170 Estrogen receptor positive status [ER+]: Principal | ICD-10-CM

## 2018-05-23 DIAGNOSIS — C50412 Malignant neoplasm of upper-outer quadrant of left female breast: Secondary | ICD-10-CM

## 2018-05-23 DIAGNOSIS — M8588 Other specified disorders of bone density and structure, other site: Secondary | ICD-10-CM | POA: Diagnosis not present

## 2018-05-23 DIAGNOSIS — Z5189 Encounter for other specified aftercare: Secondary | ICD-10-CM | POA: Insufficient documentation

## 2018-05-23 DIAGNOSIS — C50912 Malignant neoplasm of unspecified site of left female breast: Secondary | ICD-10-CM | POA: Diagnosis not present

## 2018-05-23 DIAGNOSIS — F419 Anxiety disorder, unspecified: Secondary | ICD-10-CM | POA: Insufficient documentation

## 2018-05-23 DIAGNOSIS — D6481 Anemia due to antineoplastic chemotherapy: Secondary | ICD-10-CM | POA: Insufficient documentation

## 2018-05-23 DIAGNOSIS — Z79899 Other long term (current) drug therapy: Secondary | ICD-10-CM | POA: Insufficient documentation

## 2018-05-23 DIAGNOSIS — R5383 Other fatigue: Secondary | ICD-10-CM | POA: Insufficient documentation

## 2018-05-23 DIAGNOSIS — Z5111 Encounter for antineoplastic chemotherapy: Secondary | ICD-10-CM | POA: Insufficient documentation

## 2018-05-23 MED ORDER — IOHEXOL 300 MG/ML  SOLN
100.0000 mL | Freq: Once | INTRAMUSCULAR | Status: AC | PRN
  Administered 2018-05-23: 100 mL via INTRAVENOUS

## 2018-05-23 MED ORDER — TECHNETIUM TC 99M MEDRONATE IV KIT
20.0000 | PACK | Freq: Once | INTRAVENOUS | Status: AC | PRN
  Administered 2018-05-23: 20 via INTRAVENOUS

## 2018-05-23 MED ORDER — SODIUM CHLORIDE (PF) 0.9 % IJ SOLN
INTRAMUSCULAR | Status: AC
  Filled 2018-05-23: qty 50

## 2018-05-23 NOTE — Progress Notes (Signed)
Patient Care Team: [name], MD as PCP - General (Family Medicine)  DIAGNOSIS:    ICD-10-CM   1. Malignant neoplasm of upper-outer quadrant of left breast in female, estrogen receptor positive ([place]) C50.412    Z17.0     SUMMARY OF ONCOLOGIC HISTORY:   Malignant neoplasm of upper-outer quadrant of left breast in female, estrogen receptor positive ([place])   05/13/2018 Initial Diagnosis    Evaluation of 5 months of thickening of the left breast upper outer quadrant.  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  Several lymph nodes were identified by ultrasound.  T2 N1 stage IIb clinical stage    05/16/2018 Cancer Staging    Staging form: Breast, AJCC 8th Edition - Clinical stage from 05/16/2018: Stage IIB (cT2, cN1, cM0, G3, ER+, PR+, HER2-) - Signed by [name], MD on 05/16/2018    05/28/2018 -  Neo-Adjuvant Chemotherapy    Neoadjuvant chemotherapy with dose dense Adriamycin and Cytoxan followed by Taxol weekly x12     CHIEF COMPLIANT: Cycle 1 Adriamycin and Cytoxan  INTERVAL HISTORY: Bethany Hanna is a 38 y.o. with above-mentioned history of left breast cancer. An ECHO from 05/23/18 showed an ejection fraction in the range of 60-65%. A breast MRI from 05/22/18 confirmed the size of the tumor in the left breast to be 5.1cm. A CT CAP and whole body bone scan from 05/23/18 showed no evidence of metastatic disease in the abdomen or pelvis and two nonspecific foci in her right supraorbital region and left fourth rib. Her port was inserted yesterday by Dr. [name] and she presents to the clinic today for cycle 1 of neoadjuvant chemotherapy with dose dense Adriamycin and Cytoxan. She is anxious today, and took a Xanax this morning, because  her husband was unable to accompany her due to coronavirus restrictions. Her labs from today were WNL.   REVIEW OF SYSTEMS:   Constitutional: Denies fevers, chills or abnormal weight loss Eyes: Denies blurriness of vision Ears, nose, mouth, throat, and face: Denies mucositis or sore throat Respiratory: Denies cough, dyspnea or wheezes Cardiovascular: Denies palpitation, chest discomfort Gastrointestinal: Denies nausea, heartburn or change in bowel habits Skin: Denies abnormal skin rashes Lymphatics: Denies new lymphadenopathy or easy bruising Neurological: Denies numbness, tingling or new weaknesses Behavioral/Psych: (+) anxiety Extremities: No lower extremity edema Breast: denies any pain or lumps or nodules in either breasts All other systems were reviewed with the patient and are negative.  I have reviewed the past medical history, past surgical history, social history and family history with the patient and they are unchanged from previous note.  ALLERGIES:  has No Known Allergies.  MEDICATIONS:  Current Outpatient Medications  Medication Sig Dispense Refill   acetaminophen (TYLENOL) 500 MG tablet Take 1,000 mg by mouth every 6 (six) hours as needed for moderate pain.      ALPRAZolam (XANAX) 0.5 MG tablet Take 0.5 mg by mouth at bedtime as needed for anxiety.      dexamethasone (DECADRON) 4 MG tablet Take 1 tablet day after chemo and 1 tablet 2 days after chemo with food 8 tablet 0   folic acid (FOLVITE) 1 MG tablet Take 1 mg by mouth daily.     lidocaine-prilocaine (EMLA) cream Apply to affected area once 30 g 3  LORazepam (ATIVAN) 0.5 MG tablet Take 1 tablet (0.5 mg total) by mouth at bedtime as needed (Nausea or vomiting). 30 tablet 0   ondansetron (ZOFRAN) 8 MG tablet Take 1 tablet (8 mg total) by mouth 2 (two) times daily as needed. Start on the third day after chemotherapy. 30 tablet 1   prochlorperazine (COMPAZINE) 10 MG tablet Take 1 tablet (10 mg total) by mouth  every 6 (six) hours as needed (Nausea or vomiting). 30 tablet 1   traMADol (ULTRAM) 50 MG tablet Take 50 mg by mouth every 6 (six) hours as needed for moderate pain.     No current facility-administered medications for this visit.     PHYSICAL EXAMINATION: ECOG PERFORMANCE STATUS: 0 - Asymptomatic  Vitals:   05/28/18 0858  BP: (!) 139/94  Pulse: 100  Resp: 18  Temp: 97.7 F (36.5 C)  SpO2: 100%   Filed Weights   05/28/18 0858  Weight: 207 lb 8 oz (94.1 kg)    GENERAL: alert, no distress and comfortable SKIN: skin color, texture, turgor are normal, no rashes or significant lesions EYES: normal, Conjunctiva are pink and non-injected, sclera clear OROPHARYNX: no exudate, no erythema and lips, buccal mucosa, and tongue normal  NECK: supple, thyroid normal size, non-tender, without nodularity LYMPH: no palpable lymphadenopathy in the cervical, axillary or inguinal LUNGS: clear to auscultation and percussion with normal breathing effort HEART: regular rate & rhythm and no murmurs and no lower extremity edema ABDOMEN: abdomen soft, non-tender and normal bowel sounds MUSCULOSKELETAL: no cyanosis of digits and no clubbing  NEURO: alert & oriented x 3 with fluent speech, no focal motor/sensory deficits EXTREMITIES: No lower extremity edema  LABORATORY DATA:  I have reviewed the data as listed CMP Latest Ref Rng & Units 03/01/2016 04/09/2015 04/08/2015  Glucose 65 - 99 mg/dL 79 111(H) 110(H)  BUN 6 - 20 mg/dL <5(L) 7 12  Creatinine 0.44 - 1.00 mg/dL 0.55 0.53 0.63  Sodium 135 - 145 mmol/L 134(L) 140 136  Potassium 3.5 - 5.1 mmol/L 3.4(L) 4.5 3.8  Chloride 101 - 111 mmol/L 105 109 105  CO2 22 - 32 mmol/L 21(L) 24 20(L)  Calcium 8.9 - 10.3 mg/dL 8.0(L) 8.8(L) 9.0  Total Protein 6.5 - 8.1 g/dL 5.9(L) 6.9 7.9  Total Bilirubin 0.3 - 1.2 mg/dL 0.3 0.7 0.9  Alkaline Phos 38 - 126 U/L 120 101 114  AST 15 - 41 U/L 14(L) 93(H) 217(H)  ALT 14 - 54 U/L 12(L) 106(H) 123(H)    Lab Results    Component Value Date   WBC 10.2 05/28/2018   HGB 12.2 05/28/2018   HCT 37.0 05/28/2018   MCV 88.9 05/28/2018   PLT 363 05/28/2018   NEUTROABS 8.2 (H) 05/28/2018    ASSESSMENT & PLAN:  Malignant neoplasm of upper-outer quadrant of left breast in female, estrogen receptor positive ([facility]) 05/13/2018:Evaluation of 5 months of thickening of the left breast upper outer quadrant.  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  Several lymph nodes were identified by ultrasound.  T2 N1 stage IIb clinical stage  Treatment plan: 1.  Neoadjuvant chemotherapy with dose dense Adriamycin and Cytoxan followed by Taxol weekly x12 2.  Breast conserving surgery with targeted node dissection if possible 3.  Adjuvant radiation therapy 4.  Followed by  adjuvant antiestrogen therapy ---------------------------------------------------------------------- Echocardiogram 05/23/2018: EF 60 to 65% Labs reviewed Chemo consent obtained Chemo education completed Antiemetics were reviewed  Return to clinic in 1 week for toxicity check    No orders of the defined types were placed in this encounter.  The patient has a good understanding of the overall plan. she agrees with it. she will call with any problems that may develop before the next visit here.  [name], MD 05/28/2018  Julious Oka Dorshimer am acting as scribe for Dr. [name].  I have reviewed the above documentation for accuracy and completeness, and I agree with the above.

## 2018-05-23 NOTE — Progress Notes (Signed)
  Echocardiogram 2D Echocardiogram has been performed.  Darlina Sicilian M 05/23/2018, 9:40 AM

## 2018-05-24 ENCOUNTER — Inpatient Hospital Stay (HOSPITAL_COMMUNITY): Admission: RE | Admit: 2018-05-24 | Payer: 59 | Source: Ambulatory Visit

## 2018-05-24 NOTE — Progress Notes (Signed)
Ensure pre surgery drink given with instructions to complete by 0900 dos, surgical soap given with instructions, pt verbalized understanding. 

## 2018-05-26 NOTE — Anesthesia Preprocedure Evaluation (Addendum)
Anesthesia Evaluation  Patient identified by MRN, date of birth, ID band Patient awake    Reviewed: Allergy & Precautions, NPO status , Patient's Chart, lab work & pertinent test results  Airway Mallampati: II  TM Distance: >3 FB Neck ROM: Full    Dental no notable dental hx. (+) Teeth Intact, Dental Advisory Given   Pulmonary neg pulmonary ROS,    Pulmonary exam normal breath sounds clear to auscultation       Cardiovascular Exercise Tolerance: Good hypertension, Pt. on medications Normal cardiovascular exam Rhythm:Regular Rate:Normal     Neuro/Psych negative neurological ROS     GI/Hepatic Neg liver ROS,   Endo/Other  negative endocrine ROS  Renal/GU negative Renal ROS     Musculoskeletal   Abdominal   Peds  Hematology negative hematology ROS (+)   Anesthesia Other Findings   Reproductive/Obstetrics                            Anesthesia Physical Anesthesia Plan  ASA: II  Anesthesia Plan: General   Post-op Pain Management:    Induction: Intravenous  PONV Risk Score and Plan: 3 and Treatment may vary due to age or medical condition and Ondansetron  Airway Management Planned: LMA  Additional Equipment:   Intra-op Plan:   Post-operative Plan: Extubation in OR  Informed Consent: I have reviewed the patients History and Physical, chart, labs and discussed the procedure including the risks, benefits and alternatives for the proposed anesthesia with the patient or authorized representative who has indicated his/her understanding and acceptance.     Dental advisory given  Plan Discussed with:   Anesthesia Plan Comments: (L  Breast Ca for pot placement w GA )       Anesthesia Quick Evaluation

## 2018-05-27 ENCOUNTER — Ambulatory Visit (HOSPITAL_BASED_OUTPATIENT_CLINIC_OR_DEPARTMENT_OTHER)
Admission: RE | Admit: 2018-05-27 | Discharge: 2018-05-27 | Disposition: A | Discharge status: Home / Self Care | Payer: 59 | Attending: General Surgery | Admitting: General Surgery

## 2018-05-27 ENCOUNTER — Ambulatory Visit (HOSPITAL_COMMUNITY): Payer: 59

## 2018-05-27 ENCOUNTER — Encounter (HOSPITAL_BASED_OUTPATIENT_CLINIC_OR_DEPARTMENT_OTHER): Payer: Self-pay | Admitting: Certified Registered"

## 2018-05-27 ENCOUNTER — Other Ambulatory Visit: Payer: Self-pay

## 2018-05-27 ENCOUNTER — Ambulatory Visit (HOSPITAL_BASED_OUTPATIENT_CLINIC_OR_DEPARTMENT_OTHER): Payer: 59 | Admitting: Anesthesiology

## 2018-05-27 ENCOUNTER — Encounter (HOSPITAL_BASED_OUTPATIENT_CLINIC_OR_DEPARTMENT_OTHER)
Admission: RE | Disposition: A | Discharge status: Home / Self Care | Payer: Self-pay | Source: Home / Self Care | Attending: General Surgery

## 2018-05-27 DIAGNOSIS — Z95828 Presence of other vascular implants and grafts: Secondary | ICD-10-CM

## 2018-05-27 DIAGNOSIS — Z452 Encounter for adjustment and management of vascular access device: Secondary | ICD-10-CM | POA: Diagnosis present

## 2018-05-27 DIAGNOSIS — Z419 Encounter for procedure for purposes other than remedying health state, unspecified: Secondary | ICD-10-CM

## 2018-05-27 DIAGNOSIS — C50412 Malignant neoplasm of upper-outer quadrant of left female breast: Secondary | ICD-10-CM | POA: Insufficient documentation

## 2018-05-27 HISTORY — PX: PORTACATH PLACEMENT: SHX2246

## 2018-05-27 LAB — POCT PREGNANCY, URINE: Preg Test, Ur: NEGATIVE

## 2018-05-27 SURGERY — INSERTION, TUNNELED CENTRAL VENOUS DEVICE, WITH PORT
Anesthesia: General | Laterality: Right

## 2018-05-27 MED ORDER — ACETAMINOPHEN 500 MG PO TABS
ORAL_TABLET | ORAL | Status: AC
  Filled 2018-05-27: qty 2

## 2018-05-27 MED ORDER — LACTATED RINGERS IV SOLN
INTRAVENOUS | Status: DC
  Administered 2018-05-27 (×2): via INTRAVENOUS

## 2018-05-27 MED ORDER — FENTANYL CITRATE (PF) 100 MCG/2ML IJ SOLN
INTRAMUSCULAR | Status: AC
  Filled 2018-05-27: qty 2

## 2018-05-27 MED ORDER — GABAPENTIN 100 MG PO CAPS
100.0000 mg | ORAL_CAPSULE | ORAL | Status: AC
  Administered 2018-05-27: 100 mg via ORAL

## 2018-05-27 MED ORDER — HEPARIN SOD (PORK) LOCK FLUSH 100 UNIT/ML IV SOLN
INTRAVENOUS | Status: AC
  Filled 2018-05-27: qty 5

## 2018-05-27 MED ORDER — SCOPOLAMINE 1 MG/3DAYS TD PT72: 1.0000 | MEDICATED_PATCH | Freq: Once | TRANSDERMAL | Status: DC | PRN

## 2018-05-27 MED ORDER — OXYCODONE HCL 5 MG PO TABS: 5.0000 mg | ORAL_TABLET | Freq: Four times a day (QID) | ORAL | 0 refills | Status: DC | PRN

## 2018-05-27 MED ORDER — HEPARIN (PORCINE) IN NACL 2-0.9 UNITS/ML
INTRAMUSCULAR | Status: AC | PRN
  Administered 2018-05-27: 1 via INTRAVENOUS

## 2018-05-27 MED ORDER — BUPIVACAINE HCL (PF) 0.25 % IJ SOLN
INTRAMUSCULAR | Status: AC
  Filled 2018-05-27: qty 30

## 2018-05-27 MED ORDER — KETOROLAC TROMETHAMINE 15 MG/ML IJ SOLN
INTRAMUSCULAR | Status: AC
  Filled 2018-05-27: qty 1

## 2018-05-27 MED ORDER — HEPARIN SOD (PORK) LOCK FLUSH 100 UNIT/ML IV SOLN
INTRAVENOUS | Status: DC | PRN
  Administered 2018-05-27: 500 [IU] via INTRAVENOUS

## 2018-05-27 MED ORDER — ONDANSETRON HCL 4 MG/2ML IJ SOLN
INTRAMUSCULAR | Status: DC | PRN
  Administered 2018-05-27: 4 mg via INTRAVENOUS

## 2018-05-27 MED ORDER — DEXAMETHASONE SODIUM PHOSPHATE 4 MG/ML IJ SOLN
INTRAMUSCULAR | Status: DC | PRN
  Administered 2018-05-27: 10 mg via INTRAVENOUS

## 2018-05-27 MED ORDER — OXYCODONE HCL 5 MG PO TABS
5.0000 mg | ORAL_TABLET | Freq: Once | ORAL | Status: AC | PRN
  Administered 2018-05-27: 5 mg via ORAL

## 2018-05-27 MED ORDER — PROPOFOL 500 MG/50ML IV EMUL
INTRAVENOUS | Status: DC | PRN
  Administered 2018-05-27: 25 ug/kg/min via INTRAVENOUS

## 2018-05-27 MED ORDER — ONDANSETRON HCL 4 MG/2ML IJ SOLN: 4.0000 mg | Freq: Once | INTRAMUSCULAR | Status: DC | PRN

## 2018-05-27 MED ORDER — CEFAZOLIN SODIUM-DEXTROSE 2-4 GM/100ML-% IV SOLN
INTRAVENOUS | Status: AC
  Filled 2018-05-27: qty 100

## 2018-05-27 MED ORDER — HEPARIN (PORCINE) IN NACL 1000-0.9 UT/500ML-% IV SOLN
INTRAVENOUS | Status: AC
  Filled 2018-05-27: qty 500

## 2018-05-27 MED ORDER — LIDOCAINE HCL (CARDIAC) PF 100 MG/5ML IV SOSY
PREFILLED_SYRINGE | INTRAVENOUS | Status: DC | PRN
  Administered 2018-05-27: 60 mg via INTRAVENOUS

## 2018-05-27 MED ORDER — MIDAZOLAM HCL 2 MG/2ML IJ SOLN
1.0000 mg | INTRAMUSCULAR | Status: DC | PRN
  Administered 2018-05-27: 2 mg via INTRAVENOUS

## 2018-05-27 MED ORDER — FENTANYL CITRATE (PF) 100 MCG/2ML IJ SOLN
50.0000 ug | INTRAMUSCULAR | Status: DC | PRN
  Administered 2018-05-27: 50 ug via INTRAVENOUS
  Administered 2018-05-27: 100 ug via INTRAVENOUS

## 2018-05-27 MED ORDER — BUPIVACAINE HCL (PF) 0.25 % IJ SOLN
INTRAMUSCULAR | Status: DC | PRN
  Administered 2018-05-27: 7 mL

## 2018-05-27 MED ORDER — FENTANYL CITRATE (PF) 100 MCG/2ML IJ SOLN
25.0000 ug | INTRAMUSCULAR | Status: DC | PRN
  Administered 2018-05-27: 50 ug via INTRAVENOUS

## 2018-05-27 MED ORDER — OXYCODONE HCL 5 MG PO TABS
ORAL_TABLET | ORAL | Status: AC
  Filled 2018-05-27: qty 1

## 2018-05-27 MED ORDER — MIDAZOLAM HCL 2 MG/2ML IJ SOLN
INTRAMUSCULAR | Status: AC
  Filled 2018-05-27: qty 2

## 2018-05-27 MED ORDER — OXYCODONE HCL 5 MG/5ML PO SOLN: 5.0000 mg | Freq: Once | ORAL | Status: AC | PRN

## 2018-05-27 MED ORDER — ACETAMINOPHEN 500 MG PO TABS
1000.0000 mg | ORAL_TABLET | ORAL | Status: AC
  Administered 2018-05-27: 1000 mg via ORAL

## 2018-05-27 MED ORDER — PROPOFOL 10 MG/ML IV BOLUS
INTRAVENOUS | Status: DC | PRN
  Administered 2018-05-27: 200 mg via INTRAVENOUS

## 2018-05-27 MED ORDER — GABAPENTIN 100 MG PO CAPS
ORAL_CAPSULE | ORAL | Status: AC
  Filled 2018-05-27: qty 1

## 2018-05-27 MED ORDER — CEFAZOLIN SODIUM-DEXTROSE 2-4 GM/100ML-% IV SOLN
2.0000 g | INTRAVENOUS | Status: AC
  Administered 2018-05-27: 2 g via INTRAVENOUS

## 2018-05-27 MED ORDER — KETOROLAC TROMETHAMINE 15 MG/ML IJ SOLN
15.0000 mg | INTRAMUSCULAR | Status: AC
  Administered 2018-05-27: 15 mg via INTRAVENOUS

## 2018-05-27 SURGICAL SUPPLY — 52 items
ADH SKN CLS APL DERMABOND .7 (GAUZE/BANDAGES/DRESSINGS) ×1
APL PRP STRL LF DISP 70% ISPRP (MISCELLANEOUS) ×1
APL SKNCLS STERI-STRIP NONHPOA (GAUZE/BANDAGES/DRESSINGS) ×1
BAG DECANTER FOR FLEXI CONT (MISCELLANEOUS) ×2 IMPLANT
BENZOIN TINCTURE PRP APPL 2/3 (GAUZE/BANDAGES/DRESSINGS) ×2 IMPLANT
BLADE SURG 11 STRL SS (BLADE) ×2 IMPLANT
BLADE SURG 15 STRL LF DISP TIS (BLADE) ×1 IMPLANT
BLADE SURG 15 STRL SS (BLADE) ×2
CANISTER SUCT 1200ML W/VALVE (MISCELLANEOUS) IMPLANT
CHLORAPREP W/TINT 26 (MISCELLANEOUS) ×2 IMPLANT
COVER BACK TABLE 60X90IN (DRAPES) ×2 IMPLANT
COVER MAYO STAND STRL (DRAPES) ×2 IMPLANT
COVER PROBE 5X48 (MISCELLANEOUS)
COVER WAND RF STERILE (DRAPES) IMPLANT
DECANTER SPIKE VIAL GLASS SM (MISCELLANEOUS) IMPLANT
DERMABOND ADVANCED (GAUZE/BANDAGES/DRESSINGS) ×1
DERMABOND ADVANCED .7 DNX12 (GAUZE/BANDAGES/DRESSINGS) ×1 IMPLANT
DRAPE C-ARM 42X72 X-RAY (DRAPES) ×2 IMPLANT
DRAPE LAPAROSCOPIC ABDOMINAL (DRAPES) ×2 IMPLANT
DRAPE UTILITY XL STRL (DRAPES) ×2 IMPLANT
DRSG TEGADERM 4X4.75 (GAUZE/BANDAGES/DRESSINGS) IMPLANT
ELECT COATED BLADE 2.86 ST (ELECTRODE) ×2 IMPLANT
ELECT REM PT RETURN 9FT ADLT (ELECTROSURGICAL) ×2
ELECTRODE REM PT RTRN 9FT ADLT (ELECTROSURGICAL) ×1 IMPLANT
GAUZE SPONGE 4X4 12PLY STRL LF (GAUZE/BANDAGES/DRESSINGS) ×2 IMPLANT
GLOVE BIO SURGEON STRL SZ7 (GLOVE) ×2 IMPLANT
GLOVE BIOGEL PI IND STRL 7.5 (GLOVE) ×1 IMPLANT
GLOVE BIOGEL PI INDICATOR 7.5 (GLOVE) ×1
GOWN STRL REUS W/ TWL LRG LVL3 (GOWN DISPOSABLE) ×2 IMPLANT
GOWN STRL REUS W/TWL LRG LVL3 (GOWN DISPOSABLE) ×4
IV KIT MINILOC 20X1 SAFETY (NEEDLE) IMPLANT
KIT CVR 48X5XPRB PLUP LF (MISCELLANEOUS) IMPLANT
KIT PORT POWER 8FR ISP CVUE (Port) ×1 IMPLANT
NDL HYPO 25X1 1.5 SAFETY (NEEDLE) ×1 IMPLANT
NDL SAFETY ECLIPSE 18X1.5 (NEEDLE) IMPLANT
NEEDLE HYPO 18GX1.5 SHARP (NEEDLE)
NEEDLE HYPO 25X1 1.5 SAFETY (NEEDLE) ×2 IMPLANT
PACK BASIN DAY SURGERY FS (CUSTOM PROCEDURE TRAY) ×2 IMPLANT
PENCIL BUTTON HOLSTER BLD 10FT (ELECTRODE) ×2 IMPLANT
SLEEVE SCD COMPRESS KNEE MED (MISCELLANEOUS) ×2 IMPLANT
STRIP CLOSURE SKIN 1/2X4 (GAUZE/BANDAGES/DRESSINGS) ×2 IMPLANT
SUT MNCRL AB 4-0 PS2 18 (SUTURE) ×2 IMPLANT
SUT PROLENE 2 0 SH DA (SUTURE) ×2 IMPLANT
SUT SILK 2 0 TIES 17X18 (SUTURE)
SUT SILK 2-0 18XBRD TIE BLK (SUTURE) IMPLANT
SUT VIC AB 3-0 SH 27 (SUTURE) ×2
SUT VIC AB 3-0 SH 27X BRD (SUTURE) ×1 IMPLANT
SYR 5ML LUER SLIP (SYRINGE) ×2 IMPLANT
SYR CONTROL 10ML LL (SYRINGE) ×2 IMPLANT
TOWEL GREEN STERILE FF (TOWEL DISPOSABLE) ×2 IMPLANT
TUBE CONNECTING 20X1/4 (TUBING) IMPLANT
YANKAUER SUCT BULB TIP NO VENT (SUCTIONS) IMPLANT

## 2018-05-27 NOTE — Discharge Instructions (Signed)
Oxycodone given at 2:35pm today!      PORT-A-CATH: POST OP INSTRUCTIONS  Always review your discharge instruction sheet given to you by the facility where your surgery was performed.   1. A prescription for pain medication may be given to you upon discharge. Take your pain medication as prescribed, if needed. If narcotic pain medicine is not needed, then you make take acetaminophen (Tylenol) or ibuprofen (Advil) as needed.  2. Take your usually prescribed medications unless otherwise directed. 3. If you need a refill on your pain medication, please contact our office. All narcotic pain medicine now requires a paper prescription.  Phoned in and fax refills are no longer allowed by law.  Prescriptions will not be filled after 5 pm or on weekends.  4. You should follow a light diet for the remainder of the day after your procedure. 5. Most patients will experience some mild swelling and/or bruising in the area of the incision. It may take several days to resolve. 6. It is common to experience some constipation if taking pain medication after surgery. Increasing fluid intake and taking a stool softener (such as Colace) will usually help or prevent this problem from occurring. A mild laxative (Milk of Magnesia or Miralax) should be taken according to package directions if there are no bowel movements after 48 hours.  7. Unless discharge instructions indicate otherwise, you may remove your bandages 48 hours after surgery, and you may shower at that time. You may have steri-strips (small white skin tapes) in place directly over the incision.  These strips should be left on the skin for 7-10 days.  If your surgeon used Dermabond (skin glue) on the incision, you may shower in 24 hours.  The glue will flake off over the next 2-3 weeks.  8. If your port is left accessed at the end of surgery (needle left in port), the dressing cannot get wet and should only by changed by a healthcare professional. When the  port is no longer accessed (when the needle has been removed), follow step 7.   9. ACTIVITIES:  Limit activity involving your arms for the next 72 hours. Do no strenuous exercise or activity for 1 week. You may drive when you are no longer taking prescription pain medication, you can comfortably wear a seatbelt, and you can maneuver your car. 10.You may need to see your doctor in the office for a follow-up appointment.  Please       check with your doctor.  11.When you receive a new Port-a-Cath, you will get a product guide and        ID card.  Please keep them in case you need them.  WHEN TO CALL YOUR DOCTOR [phone]): 1. Fever over 101.0 2. Chills 3. Continued bleeding from incision 4. Increased redness and tenderness at the site 5. Shortness of breath, difficulty breathing   The clinic staff is available to answer your questions during regular business hours. Please dont hesitate to call and ask to speak to one of the nurses or medical assistants for clinical concerns. If you have a medical emergency, go to the nearest emergency room or call 911.  A surgeon from [facility] is always on call at the hospital.     For further information, please visit [email or web address]      Post Anesthesia Home Care Instructions  Activity: Get plenty of rest for the remainder of the day. A responsible individual must stay with you for 24 hours following the  procedure.  For the next 24 hours, DO NOT: -Drive a car -Paediatric nurse -Drink alcoholic beverages -Take any medication unless instructed by your physician -Make any legal decisions or sign important papers.  Meals: Start with liquid foods such as gelatin or soup. Progress to regular foods as tolerated. Avoid greasy, spicy, heavy foods. If nausea and/or vomiting occur, drink only clear liquids until the nausea and/or vomiting subsides. Call your physician if vomiting continues.  Special  Instructions/Symptoms: Your throat may feel dry or sore from the anesthesia or the breathing tube placed in your throat during surgery. If this causes discomfort, gargle with warm salt water. The discomfort should disappear within 24 hours.  If you had a scopolamine patch placed behind your ear for the management of post- operative nausea and/or vomiting:  1. The medication in the patch is effective for 72 hours, after which it should be removed.  Wrap patch in a tissue and discard in the trash. Wash hands thoroughly with soap and water. 2. You may remove the patch earlier than 72 hours if you experience unpleasant side effects which may include dry mouth, dizziness or visual disturbances. 3. Avoid touching the patch. Wash your hands with soap and water after contact with the patch.

## 2018-05-27 NOTE — Anesthesia Procedure Notes (Signed)
Procedure Name: LMA Insertion Date/Time: 05/27/2018 1:03 PM Performed by: Signe Colt, CRNA Pre-anesthesia Checklist: Patient identified, Emergency Drugs available, Suction available and Patient being monitored Patient Re-evaluated:Patient Re-evaluated prior to induction Oxygen Delivery Method: Circle system utilized Preoxygenation: Pre-oxygenation with 100% oxygen Induction Type: IV induction Ventilation: Mask ventilation without difficulty LMA: LMA inserted LMA Size: 4.0 Number of attempts: 1 Airway Equipment and Method: Bite block Placement Confirmation: positive ETCO2 Tube secured with: Tape Dental Injury: Teeth and Oropharynx as per pre-operative assessment

## 2018-05-27 NOTE — Interval H&P Note (Signed)
History and Physical Interval Note:  05/27/2018 12:46 PM  Bethany Hanna  has presented today for surgery, with the diagnosis of breast cancer.  The various methods of treatment have been discussed with the patient and family. After consideration of risks, benefits and other options for treatment, the patient has consented to  Procedure(s) with comments: INSERTION PORT-A-CATH WITH ULTRASOUND (N/A) - GENERAL AND LMA as a surgical intervention.  The patient's history has been reviewed, patient examined, no change in status, stable for surgery.  I have reviewed the patient's chart and labs.  Questions were answered to the patient's satisfaction.     [name]

## 2018-05-27 NOTE — Transfer of Care (Signed)
Immediate Anesthesia Transfer of Care Note  Patient: Bethany Hanna  Procedure(s) Performed: INSERTION PORT-A-CATH WITH ULTRASOUND (N/A )  Patient Location: PACU  Anesthesia Type:General  Level of Consciousness: drowsy and patient cooperative  Airway & Oxygen Therapy: Patient Spontanous Breathing and Patient connected to face mask oxygen  Post-op Assessment: Report given to RN and Post -op Vital signs reviewed and stable  Post vital signs: Reviewed and stable  Last Vitals:  Vitals Value Taken Time  BP    Temp    Pulse 72 05/27/2018  1:42 PM  Resp 8 05/27/2018  1:42 PM  SpO2 100 % 05/27/2018  1:42 PM  Vitals shown include unvalidated device data.  Last Pain:  Vitals:   05/27/18 1107  TempSrc: Oral  PainSc: 0-No pain         Complications: No apparent anesthesia complications

## 2018-05-27 NOTE — H&P (Signed)
38 yof referred by Dr Lindi Adie for new left breast cancer. she has a 52 and 37 year old son. works as Psychologist, forensic. she has used ferility treatments in past. had miscarriage last year. she has no fh of breast or ovarian cancer. she has no prior breast history. she has no dc. she noted a mass after the miscarriage that persisted. she had mammogram that shows d density breasts. there is a 2 cm irregular mass in the uoq. US of the breast shows a 4 cm mass with various size nodes in the left axilla. one of these is 2.5 cm. 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 node is positive. she is here with her husband and her father to discuss options. she has seen dr Lindi Adie this am   Past Surgical History Illene Regulus, CMA; 05/16/2018 4:02 PM) Breast Biopsy  Left. Gallbladder Surgery - Laparoscopic  Oral Surgery   Diagnostic Studies History Illene Regulus, CMA; 05/16/2018 4:02 PM) Colonoscopy  never Mammogram  within last year Pap Smear  1-5 years ago  Allergies Illene Regulus, CMA; 05/16/2018 4:02 PM) No Known Drug Allergies [05/16/2018]:  Medication History (Alisha Spillers, CMA; 05/16/2018 4:03 PM) ALPRAZolam (0.5MG Tablet, Oral) Active. Folic Acid (1MG Tablet, Oral) Active. Prenatal Vitamins (28-0.8MG Tablet, Oral) Active. Medications Reconciled  Pregnancy / Birth History Illene Regulus, CMA; 05/16/2018 4:02 PM) Age at menarche  40 years. Gravida  3 Length (months) of breastfeeding  3-6 Maternal age  12-35 Para  2 Regular periods   Other Problems Illene Regulus, CMA; 05/16/2018 4:02 PM) Anxiety Disorder  Breast Cancer     Review of Systems Lars Mage Spillers CMA; 05/16/2018 4:02 PM) General Not Present- Appetite Loss, Chills, Fatigue, Fever, Night Sweats, Weight Gain and Weight Loss. Skin Not Present- Change in Wart/Mole, Dryness, Hives, Jaundice, New Lesions, Non-Healing Wounds,  Rash and Ulcer. HEENT Not Present- Earache, Hearing Loss, Hoarseness, Nose Bleed, Oral Ulcers, Ringing in the Ears, Seasonal Allergies, Sinus Pain, Sore Throat, Visual Disturbances, Wears glasses/contact lenses and Yellow Eyes. Respiratory Not Present- Bloody sputum, Chronic Cough, Difficulty Breathing, Snoring and Wheezing. Breast Present- Breast Mass and Breast Pain. Not Present- Nipple Discharge and Skin Changes. Cardiovascular Not Present- Chest Pain, Difficulty Breathing Lying Down, Leg Cramps, Palpitations, Rapid Heart Rate, Shortness of Breath and Swelling of Extremities. Gastrointestinal Not Present- Abdominal Pain, Bloating, Bloody Stool, Change in Bowel Habits, Chronic diarrhea, Constipation, Difficulty Swallowing, Excessive gas, Gets full quickly at meals, Hemorrhoids, Indigestion, Nausea, Rectal Pain and Vomiting. Female Genitourinary Not Present- Frequency, Nocturia, Painful Urination, Pelvic Pain and Urgency. Musculoskeletal Not Present- Back Pain, Joint Pain, Joint Stiffness, Muscle Pain, Muscle Weakness and Swelling of Extremities. Neurological Not Present- Decreased Memory, Fainting, Headaches, Numbness, Seizures, Tingling, Tremor, Trouble walking and Weakness. Psychiatric Present- Anxiety. Not Present- Bipolar, Change in Sleep Pattern, Depression, Fearful and Frequent crying. Endocrine Not Present- Cold Intolerance, Excessive Hunger, Hair Changes, Heat Intolerance, Hot flashes and New Diabetes. Hematology Not Present- Blood Thinners, Easy Bruising, Excessive bleeding, Gland problems, HIV and Persistent Infections.  Vitals (Alisha Spillers CMA; 05/16/2018 4:02 PM) 05/16/2018 4:02 PM Weight: 206 lb Height: 68in Body Surface Area: 2.07 m Body Mass Index: 31.32 kg/m  Pulse: 120 (Regular)  BP: 160/88 (Sitting, Left Arm, Standard)       Physical Exam [name] MD; 05/16/2018 5:13 PM) General Mental Status-Alert.  Head and  Neck Trachea-midline. Thyroid Gland Characteristics - normal size and consistency.  Eye Sclera/Conjunctiva - Bilateral-No scleral icterus.  Chest and Lung Exam Chest and lung exam reveals -quiet, even and easy respiratory effort with no use of accessory muscles and on auscultation, normal breath sounds, no adventitious sounds and normal vocal resonance.  Breast Nipples-No Discharge. Note: 4 cm luoq mass mobile nontender, no skin involvement   Cardiovascular Cardiovascular examination reveals -normal heart sounds, regular rate and rhythm with no murmurs.  Abdomen Note: soft nt/no hepatomegaly   Neurologic Neurologic evaluation reveals -alert and oriented x 3 with no impairment of recent or remote memory.  Lymphatic Head & Neck  General Head & Neck Lymphatics: Bilateral - Description - Normal. Axillary  General Axillary Region: Bilateral - Description - Normal. Note: no Reed adenopathy     Assessment & Plan [name] MD; 05/16/2018 5:27 PM) BREAST CANCER OF UPPER-OUTER QUADRANT OF LEFT FEMALE BREAST (C50.412) Story: MRI, genetics, port placement, primary systemic therapy we discussed staging and pathophysiology of breast cancer. she is indciated for chemotherapy/anti her2 therapy and I think doing this primarily is ideal. this may lead to less surgery down the road especially with her nodes. this will also allow for prognostic information for further treatment. we discussed port placement today, leaving accessed, what it would be. we also discussed options for surgery down the road which will be dependent upon a number of other factors: genetics, mri, response to chemotherapy.

## 2018-05-27 NOTE — Op Note (Signed)
Preoperative diagnosis her 2 positive, node positive left breast cancer Postoperative diagnosis: same as above Procedure: right ij US guided powerport insertion Surgeon: Dr Serita Grammes EBL: minimal Anes: general  Specimensnone Complications none Drains none Sponge count correct Dispo to pacu stable  Indications: This is a 53 yof with triple positive left breast cancer with positive node. she has been seen in [place]. We discussed primary chemotherapy.  We discussed port placement.  Procedure: After informed consent was obtained the patient was taken to the operating room. She was given antibiotics. Sequential compression devices were on her legs. She was then placed under general anesthesia. Then she was prepped and draped in the standard sterile surgical fashion. Surgical timeout was then performed.  Ithenused the ultrasound to identify the right internal jugular vein. I then accessed the vein using the ultrasound.This aspirated blood. I then placed the wire. This was confirmed by fluoroscopy and ultrasound to be in the correct position.I then infiltrated marcaine and made anincision. I created a pocket.I tunneled the line between the 2 sites.I then dilated the tract and placed the dilator assembly with the sheath. This was done under fluoroscopy. I then removed the sheath and dilator. The wire was also removed. The line was then pulled back to be in the venacava. I hooked this up to the port. I sutured this into place with 2-0 Prolene in 2 places. This aspirated blood and flushed easily.This was confirmed with a final fluoroscopy. I then closed this with 2-0 Vicryl and 4-0 Monocryl. I placed the access needle as she begins chemo tomorrow.This withdrew blood and I placed heparin in it.Dermabond was placed on both the incisions.She tolerated this well and was transferred to the recovery room in stable condition

## 2018-05-27 NOTE — Anesthesia Postprocedure Evaluation (Signed)
Anesthesia Post Note  Patient: Bethany Hanna  Procedure(s) Performed: INSERTION PORT-A-CATH WITH ULTRASOUND (Right )     Patient location during evaluation: PACU Anesthesia Type: General Level of consciousness: awake and alert Pain management: pain level controlled Vital Signs Assessment: post-procedure vital signs reviewed and stable Respiratory status: spontaneous breathing, nonlabored ventilation and respiratory function stable Cardiovascular status: blood pressure returned to baseline and stable Postop Assessment: no apparent nausea or vomiting Anesthetic complications: no    Last Vitals:  Vitals:   05/27/18 1400 05/27/18 1415  BP: 130/90 (!) 129/92  Pulse: 92 86  Resp: 16 15  Temp:    SpO2: 99% 98%    Last Pain:  Vitals:   05/27/18 1409  TempSrc:   PainSc: 6                  Lidia Collum

## 2018-05-28 ENCOUNTER — Inpatient Hospital Stay (HOSPITAL_BASED_OUTPATIENT_CLINIC_OR_DEPARTMENT_OTHER): Payer: 59 | Admitting: Hematology and Oncology

## 2018-05-28 ENCOUNTER — Inpatient Hospital Stay: Payer: 59

## 2018-05-28 ENCOUNTER — Other Ambulatory Visit: Payer: Self-pay

## 2018-05-28 ENCOUNTER — Telehealth: Payer: Self-pay | Admitting: *Deleted

## 2018-05-28 ENCOUNTER — Encounter (HOSPITAL_BASED_OUTPATIENT_CLINIC_OR_DEPARTMENT_OTHER): Payer: Self-pay | Admitting: General Surgery

## 2018-05-28 DIAGNOSIS — C50412 Malignant neoplasm of upper-outer quadrant of left female breast: Secondary | ICD-10-CM

## 2018-05-28 DIAGNOSIS — Z79899 Other long term (current) drug therapy: Secondary | ICD-10-CM | POA: Diagnosis not present

## 2018-05-28 DIAGNOSIS — Z170 Estrogen receptor positive status [ER+]: Secondary | ICD-10-CM

## 2018-05-28 DIAGNOSIS — C773 Secondary and unspecified malignant neoplasm of axilla and upper limb lymph nodes: Secondary | ICD-10-CM | POA: Diagnosis not present

## 2018-05-28 DIAGNOSIS — Z95828 Presence of other vascular implants and grafts: Secondary | ICD-10-CM

## 2018-05-28 DIAGNOSIS — Z5189 Encounter for other specified aftercare: Secondary | ICD-10-CM | POA: Diagnosis present

## 2018-05-28 DIAGNOSIS — D6481 Anemia due to antineoplastic chemotherapy: Secondary | ICD-10-CM | POA: Diagnosis not present

## 2018-05-28 DIAGNOSIS — F419 Anxiety disorder, unspecified: Secondary | ICD-10-CM

## 2018-05-28 DIAGNOSIS — R5383 Other fatigue: Secondary | ICD-10-CM | POA: Diagnosis not present

## 2018-05-28 DIAGNOSIS — Z5111 Encounter for antineoplastic chemotherapy: Secondary | ICD-10-CM | POA: Diagnosis present

## 2018-05-28 LAB — CBC WITH DIFFERENTIAL (CANCER CENTER ONLY)
ABS IMMATURE GRANULOCYTES: 0.03 10*3/uL (ref 0.00–0.07)
BASOS PCT: 0 %
Basophils Absolute: 0 10*3/uL (ref 0.0–0.1)
Eosinophils Absolute: 0 10*3/uL (ref 0.0–0.5)
Eosinophils Relative: 0 %
HCT: 37 % (ref 36.0–46.0)
Hemoglobin: 12.2 g/dL (ref 12.0–15.0)
Immature Granulocytes: 0 %
Lymphocytes Relative: 12 %
Lymphs Abs: 1.2 10*3/uL (ref 0.7–4.0)
MCH: 29.3 pg (ref 26.0–34.0)
MCHC: 33 g/dL (ref 30.0–36.0)
MCV: 88.9 fL (ref 80.0–100.0)
Monocytes Absolute: 0.8 10*3/uL (ref 0.1–1.0)
Monocytes Relative: 8 %
NEUTROS ABS: 8.2 10*3/uL — AB (ref 1.7–7.7)
Neutrophils Relative %: 80 %
Platelet Count: 363 10*3/uL (ref 150–400)
RBC: 4.16 MIL/uL (ref 3.87–5.11)
RDW: 13.1 % (ref 11.5–15.5)
WBC Count: 10.2 10*3/uL (ref 4.0–10.5)
nRBC: 0 % (ref 0.0–0.2)

## 2018-05-28 LAB — CMP (CANCER CENTER ONLY)
ALT: 13 U/L (ref 0–44)
AST: 12 U/L — ABNORMAL LOW (ref 15–41)
Albumin: 3.7 g/dL (ref 3.5–5.0)
Alkaline Phosphatase: 82 U/L (ref 38–126)
Anion gap: 12 (ref 5–15)
BUN: 7 mg/dL (ref 6–20)
CO2: 22 mmol/L (ref 22–32)
Calcium: 8.6 mg/dL — ABNORMAL LOW (ref 8.9–10.3)
Chloride: 104 mmol/L (ref 98–111)
Creatinine: 0.69 mg/dL (ref 0.44–1.00)
GFR, Est AFR Am: >60 mL/min (ref >60)
GFR, Estimated: >60 mL/min (ref >60)
Glucose, Bld: 107 mg/dL — ABNORMAL HIGH (ref 70–99)
Potassium: 3.7 mmol/L (ref 3.5–5.1)
Sodium: 138 mmol/L (ref 135–145)
Total Bilirubin: 0.4 mg/dL (ref 0.3–1.2)
Total Protein: 7.4 g/dL (ref 6.5–8.1)

## 2018-05-28 LAB — PREGNANCY, URINE: Preg Test, Ur: NEGATIVE

## 2018-05-28 MED ORDER — PALONOSETRON HCL INJECTION 0.25 MG/5ML
0.2500 mg | Freq: Once | INTRAVENOUS | Status: AC
  Administered 2018-05-28: 0.25 mg via INTRAVENOUS

## 2018-05-28 MED ORDER — PEGFILGRASTIM 6 MG/0.6ML ~~LOC~~ PSKT
PREFILLED_SYRINGE | SUBCUTANEOUS | Status: AC
  Filled 2018-05-28: qty 0.6

## 2018-05-28 MED ORDER — DOXORUBICIN HCL CHEMO IV INJECTION 2 MG/ML
60.0000 mg/m2 | Freq: Once | INTRAVENOUS | Status: AC
  Administered 2018-05-28: 128 mg via INTRAVENOUS
  Filled 2018-05-28: qty 64

## 2018-05-28 MED ORDER — SODIUM CHLORIDE 0.9 % IV SOLN
600.0000 mg/m2 | Freq: Once | INTRAVENOUS | Status: AC
  Administered 2018-05-28: 1280 mg via INTRAVENOUS
  Filled 2018-05-28: qty 64

## 2018-05-28 MED ORDER — SODIUM CHLORIDE 0.9% FLUSH
10.0000 mL | INTRAVENOUS | Status: DC | PRN
  Administered 2018-05-28: 10 mL via INTRAVENOUS
  Filled 2018-05-28: qty 10

## 2018-05-28 MED ORDER — SODIUM CHLORIDE 0.9 % IV SOLN
Freq: Once | INTRAVENOUS | Status: AC
  Administered 2018-05-28: 10:00:00 via INTRAVENOUS
  Filled 2018-05-28: qty 250

## 2018-05-28 MED ORDER — SODIUM CHLORIDE 0.9% FLUSH
10.0000 mL | INTRAVENOUS | Status: DC | PRN
  Administered 2018-05-28: 10 mL
  Filled 2018-05-28: qty 10

## 2018-05-28 MED ORDER — HEPARIN SOD (PORK) LOCK FLUSH 100 UNIT/ML IV SOLN
500.0000 [IU] | Freq: Once | INTRAVENOUS | Status: AC | PRN
  Administered 2018-05-28: 500 [IU]
  Filled 2018-05-28: qty 5

## 2018-05-28 MED ORDER — PEGFILGRASTIM 6 MG/0.6ML ~~LOC~~ PSKT
6.0000 mg | PREFILLED_SYRINGE | Freq: Once | SUBCUTANEOUS | Status: AC
  Administered 2018-05-28: 6 mg via SUBCUTANEOUS

## 2018-05-28 MED ORDER — SODIUM CHLORIDE 0.9 % IV SOLN
Freq: Once | INTRAVENOUS | Status: AC
  Administered 2018-05-28: 10:00:00 via INTRAVENOUS
  Filled 2018-05-28: qty 5

## 2018-05-28 MED ORDER — PALONOSETRON HCL INJECTION 0.25 MG/5ML
INTRAVENOUS | Status: AC
  Filled 2018-05-28: qty 5

## 2018-05-28 NOTE — Telephone Encounter (Signed)
Left vm assessing needs after 1st chemo. Contact information provided.

## 2018-05-28 NOTE — Assessment & Plan Note (Signed)
05/13/2018:Evaluation of 5 months of thickening of the left breast upper outer quadrant.  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  Several lymph nodes were identified by ultrasound.  T2 N1 stage IIb clinical stage  Treatment plan: 1.  Neoadjuvant chemotherapy with dose dense Adriamycin and Cytoxan followed by Taxol weekly x12 2.  Breast conserving surgery with targeted node dissection if possible 3.  Adjuvant radiation therapy 4.  Followed by adjuvant antiestrogen therapy ---------------------------------------------------------------------- Echocardiogram 05/23/2018: EF 60 to 65% Labs reviewed Chemo consent obtained Chemo education completed Antiemetics were reviewed  Return to clinic in 1 week for toxicity check

## 2018-05-28 NOTE — Patient Instructions (Addendum)
Coronavirus (COVID-19) Are you at risk?  Are you at risk for the Coronavirus (COVID-19)?  To be considered HIGH RISK for Coronavirus (COVID-19), you have to meet the following criteria:  . Traveled to [place], [place], [place], [place] or [place]; or in the [place] to [place], [place], [place], or [place]; and have fever, cough, and shortness of breath within the last 2 weeks of travel OR . Been in close contact with a person diagnosed with COVID-19 within the last 2 weeks and have fever, cough, and shortness of breath . IF YOU DO NOT MEET THESE CRITERIA, YOU ARE CONSIDERED LOW RISK FOR COVID-19.  What to do if you are HIGH RISK for COVID-19?  . If you are having a medical emergency, call 911. . Seek medical care right away. Before you go to a doctor's office, urgent care or emergency department, call ahead and tell them about your recent travel, contact with someone diagnosed with COVID-19, and your symptoms. You should receive instructions from your physician's office regarding next steps of care.  . When you arrive at healthcare provider, tell the healthcare staff immediately you have returned from visiting [place], [place], [place], [place] or [place]; or traveled in the [place] to [place], [place], [place], or [place]; in the last two weeks or you have been in close contact with a person diagnosed with COVID-19 in the last 2 weeks.   . Tell the health care staff about your symptoms: fever, cough and shortness of breath. . After you have been seen by a medical provider, you will be either: o Tested for (COVID-19) and discharged home on quarantine except to seek medical care if symptoms worsen, and asked to  - Stay home and avoid contact with others until you get your results (4-5 days)  - Avoid travel on public transportation if possible (such as bus, train, or airplane) or o Sent to the Emergency Department by EMS for evaluation, COVID-19 testing, and possible  admission depending on your condition and test results.  What to do if you are LOW RISK for COVID-19?  Reduce your risk of any infection by using the same precautions used for avoiding the common cold or flu:  . Wash your hands often with soap and warm water for at least 20 seconds.  If soap and water are not readily available, use an alcohol-based hand sanitizer with at least 60% alcohol.  . If coughing or sneezing, cover your mouth and nose by coughing or sneezing into the elbow areas of your shirt or coat, into a tissue or into your sleeve (not your hands). . Avoid shaking hands with others and consider head nods or verbal greetings only. . Avoid touching your eyes, nose, or mouth with unwashed hands.  . Avoid close contact with people who are sick. . Avoid places or events with large numbers of people in one location, like concerts or sporting events. . Carefully consider travel plans you have or are making. . If you are planning any travel outside or inside the [place], visit the CDC's Travelers' Health webpage for the latest health notices. . If you have some symptoms but not all symptoms, continue to monitor at home and seek medical attention if your symptoms worsen. . If you are having a medical emergency, call 911.   ADDITIONAL HEALTHCARE OPTIONS FOR PATIENTS  [facility] / e-Visit: [email or web address]         [facility]: [phone]  [facility]   Urgent Care: [place] [facility]: [place] Discharge Instructions for Patients Receiving Chemotherapy  Today you received the following chemotherapy agents Adriamycin and Cytoxan  To help prevent nausea and vomiting after your treatment, we encourage you to take your nausea medication as prescribed.   If you develop nausea and vomiting that is not controlled by your nausea medication, call the clinic.    BELOW ARE SYMPTOMS THAT SHOULD BE REPORTED IMMEDIATELY:  *FEVER GREATER THAN 100.5 F  *CHILLS WITH OR WITHOUT FEVER  NAUSEA AND VOMITING THAT IS NOT CONTROLLED WITH YOUR NAUSEA MEDICATION  *UNUSUAL SHORTNESS OF BREATH  *UNUSUAL BRUISING OR BLEEDING  TENDERNESS IN MOUTH AND THROAT WITH OR WITHOUT PRESENCE OF ULCERS  *URINARY PROBLEMS  *BOWEL PROBLEMS  UNUSUAL RASH Items with * indicate a potential emergency and should be followed up as soon as possible.  Feel free to call the clinic should you have any questions or concerns. The clinic phone number is [phone].  Please show the [facility] at check-in to the Emergency Department and triage nurse.   Doxorubicin injection(Adriamycin) What is this medicine? DOXORUBICIN (dox oh ROO bi sin) is a chemotherapy drug. It is used to treat many kinds of cancer like leukemia, lymphoma, neuroblastoma, sarcoma, and Wilms' tumor. It is also used to treat bladder cancer, breast cancer, lung cancer, ovarian cancer, stomach cancer, and thyroid cancer. This medicine may be used for other purposes; ask your health care provider or pharmacist if you have questions. COMMON BRAND NAME(S): Adriamycin, Adriamycin PFS, Adriamycin RDF, Rubex What should I tell my health care provider before I take this medicine? They need to know if you have any of these conditions: -heart disease -history of low blood counts caused by a medicine -liver disease -recent or ongoing radiation therapy -an unusual or allergic reaction to doxorubicin, other chemotherapy agents, other medicines, foods, dyes, or preservatives -pregnant or trying to get pregnant -breast-feeding How should I use this medicine? This drug is given as an infusion into a vein. It is administered in a hospital or clinic by a specially trained health care professional. If you have pain, swelling, burning or any unusual feeling around the site of your injection, tell your health care  professional right away. Talk to your pediatrician regarding the use of this medicine in children. Special care may be needed. Overdosage: If you think you have taken too much of this medicine contact a poison control center or emergency room at once. NOTE: This medicine is only for you. Do not share this medicine with others. What if I miss a dose? It is important not to miss your dose. Call your doctor or health care professional if you are unable to keep an appointment. What may interact with this medicine? This medicine may interact with the following medications: -6-mercaptopurine -paclitaxel -phenytoin -St. John's Wort -trastuzumab -verapamil This list may not describe all possible interactions. Give your health care provider a list of all the medicines, herbs, non-prescription drugs, or dietary supplements you use. Also tell them if you smoke, drink alcohol, or use illegal drugs. Some items may interact with your medicine. What should I watch for while using this medicine? This drug may make you feel generally unwell. This is not uncommon, as chemotherapy can affect healthy cells as well as cancer cells. Report any side effects. Continue your course of treatment  even though you feel ill unless your doctor tells you to stop. There is a maximum amount of this medicine you should receive throughout your life. The amount depends on the medical condition being treated and your overall health. Your doctor will watch how much of this medicine you receive in your lifetime. Tell your doctor if you have taken this medicine before. You may need blood work done while you are taking this medicine. Your urine may turn red for a few days after your dose. This is not blood. If your urine is dark or brown, call your doctor. In some cases, you may be given additional medicines to help with side effects. Follow all directions for their use. Call your doctor or health care professional for advice if you get  a fever, chills or sore throat, or other symptoms of a cold or flu. Do not treat yourself. This drug decreases your body's ability to fight infections. Try to avoid being around people who are sick. This medicine may increase your risk to bruise or bleed. Call your doctor or health care professional if you notice any unusual bleeding. Talk to your doctor about your risk of cancer. You may be more at risk for certain types of cancers if you take this medicine. Do not become pregnant while taking this medicine or for 6 months after stopping it. Women should inform their doctor if they wish to become pregnant or think they might be pregnant. Men should not father a child while taking this medicine and for 6 months after stopping it. There is a potential for serious side effects to an unborn child. Talk to your health care professional or pharmacist for more information. Do not breast-feed an infant while taking this medicine. This medicine has caused ovarian failure in some women and reduced sperm counts in some men This medicine may interfere with the ability to have a child. Talk with your doctor or health care professional if you are concerned about your fertility. This medicine may cause a decrease in Co-Enzyme Q-10. You should make sure that you get enough Co-Enzyme Q-10 while you are taking this medicine. Discuss the foods you eat and the vitamins you take with your health care professional. What side effects may I notice from receiving this medicine? Side effects that you should report to your doctor or health care professional as soon as possible: -allergic reactions like skin rash, itching or hives, swelling of the face, lips, or tongue -breathing problems -chest pain -fast or irregular heartbeat -low blood counts - this medicine may decrease the number of white blood cells, red blood cells and platelets. You may be at increased risk for infections and bleeding. -pain, redness, or irritation at  site where injected -signs of infection - fever or chills, cough, sore throat, pain or difficulty passing urine -signs of decreased platelets or bleeding - bruising, pinpoint red spots on the skin, black, tarry stools, blood in the urine -swelling of the ankles, feet, hands -tiredness -weakness Side effects that usually do not require medical attention (report to your doctor or health care professional if they continue or are bothersome): -diarrhea -hair loss -mouth sores -nail discoloration or damage -nausea -red colored urine -vomiting This list may not describe all possible side effects. Call your doctor for medical advice about side effects. You may report side effects to FDA at 1-800-FDA-1088. Where should I keep my medicine? This drug is given in a hospital or clinic and will not be stored at home. NOTE:  This sheet is a summary. It may not cover all possible information. If you have questions about this medicine, talk to your doctor, pharmacist, or health care provider.  2019 Elsevier/Gold Standard (2016-10-04 11:01:26)  Cyclophosphamide injection(Cytoxan) What is this medicine? CYCLOPHOSPHAMIDE (sye kloe FOSS fa mide) is a chemotherapy drug. It slows the growth of cancer cells. This medicine is used to treat many types of cancer like lymphoma, myeloma, leukemia, breast cancer, and ovarian cancer, to name a few. This medicine may be used for other purposes; ask your health care provider or pharmacist if you have questions. COMMON BRAND NAME(S): Cytoxan, Neosar What should I tell my health care provider before I take this medicine? They need to know if you have any of these conditions: -blood disorders -history of other chemotherapy -infection -kidney disease -liver disease -recent or ongoing radiation therapy -tumors in the bone marrow -an unusual or allergic reaction to cyclophosphamide, other chemotherapy, other medicines, foods, dyes, or preservatives -pregnant or trying  to get pregnant -breast-feeding How should I use this medicine? This drug is usually given as an injection into a vein or muscle or by infusion into a vein. It is administered in a hospital or clinic by a specially trained health care professional. Talk to your pediatrician regarding the use of this medicine in children. Special care may be needed. Overdosage: If you think you have taken too much of this medicine contact a poison control center or emergency room at once. NOTE: This medicine is only for you. Do not share this medicine with others. What if I miss a dose? It is important not to miss your dose. Call your doctor or health care professional if you are unable to keep an appointment. What may interact with this medicine? This medicine may interact with the following medications: -amiodarone -amphotericin B -azathioprine -certain antiviral medicines for HIV or AIDS such as protease inhibitors (e.g., indinavir, ritonavir) and zidovudine -certain blood pressure medications such as benazepril, captopril, enalapril, fosinopril, lisinopril, moexipril, monopril, perindopril, quinapril, ramipril, trandolapril -certain cancer medications such as anthracyclines (e.g., daunorubicin, doxorubicin), busulfan, cytarabine, paclitaxel, pentostatin, tamoxifen, trastuzumab -certain diuretics such as chlorothiazide, chlorthalidone, hydrochlorothiazide, indapamide, metolazone -certain medicines that treat or prevent blood clots like warfarin -certain muscle relaxants such as succinylcholine -cyclosporine -etanercept -indomethacin -medicines to increase blood counts like filgrastim, pegfilgrastim, sargramostim -medicines used as general anesthesia -metronidazole -natalizumab This list may not describe all possible interactions. Give your health care provider a list of all the medicines, herbs, non-prescription drugs, or dietary supplements you use. Also tell them if you smoke, drink alcohol, or use  illegal drugs. Some items may interact with your medicine. What should I watch for while using this medicine? Visit your doctor for checks on your progress. This drug may make you feel generally unwell. This is not uncommon, as chemotherapy can affect healthy cells as well as cancer cells. Report any side effects. Continue your course of treatment even though you feel ill unless your doctor tells you to stop. Drink water or other fluids as directed. Urinate often, even at night. In some cases, you may be given additional medicines to help with side effects. Follow all directions for their use. Call your doctor or health care professional for advice if you get a fever, chills or sore throat, or other symptoms of a cold or flu. Do not treat yourself. This drug decreases your body's ability to fight infections. Try to avoid being around people who are sick. This medicine may increase your risk to  bruise or bleed. Call your doctor or health care professional if you notice any unusual bleeding. Be careful brushing and flossing your teeth or using a toothpick because you may get an infection or bleed more easily. If you have any dental work done, tell your dentist you are receiving this medicine. You may get drowsy or dizzy. Do not drive, use machinery, or do anything that needs mental alertness until you know how this medicine affects you. Do not become pregnant while taking this medicine or for 1 year after stopping it. Women should inform their doctor if they wish to become pregnant or think they might be pregnant. Men should not father a child while taking this medicine and for 4 months after stopping it. There is a potential for serious side effects to an unborn child. Talk to your health care professional or pharmacist for more information. Do not breast-feed an infant while taking this medicine. This medicine may interfere with the ability to have a child. This medicine has caused ovarian failure in some  women. This medicine has caused reduced sperm counts in some men. You should talk with your doctor or health care professional if you are concerned about your fertility. If you are going to have surgery, tell your doctor or health care professional that you have taken this medicine. What side effects may I notice from receiving this medicine? Side effects that you should report to your doctor or health care professional as soon as possible: -allergic reactions like skin rash, itching or hives, swelling of the face, lips, or tongue -low blood counts - this medicine may decrease the number of white blood cells, red blood cells and platelets. You may be at increased risk for infections and bleeding. -signs of infection - fever or chills, cough, sore throat, pain or difficulty passing urine -signs of decreased platelets or bleeding - bruising, pinpoint red spots on the skin, black, tarry stools, blood in the urine -signs of decreased red blood cells - unusually weak or tired, fainting spells, lightheadedness -breathing problems -dark urine -dizziness -palpitations -swelling of the ankles, feet, hands -trouble passing urine or change in the amount of urine -weight gain -yellowing of the eyes or skin Side effects that usually do not require medical attention (report to your doctor or health care professional if they continue or are bothersome): -changes in nail or skin color -hair loss -missed menstrual periods -mouth sores -nausea, vomiting This list may not describe all possible side effects. Call your doctor for medical advice about side effects. You may report side effects to FDA at 1-800-FDA-1088. Where should I keep my medicine? This drug is given in a hospital or clinic and will not be stored at home. NOTE: This sheet is a summary. It may not cover all possible information. If you have questions about this medicine, talk to your doctor, pharmacist, or health care provider.  2019  Elsevier/Gold Standard (2012-01-05 16:22:58)

## 2018-05-29 ENCOUNTER — Telehealth: Payer: Self-pay | Admitting: *Deleted

## 2018-05-29 NOTE — Telephone Encounter (Signed)
Attempted to call pt for a follow up treatment call to see how she is doing.  No answer, left message on voicemail for pt to return call.

## 2018-05-29 NOTE — Telephone Encounter (Signed)
Called GRETNA BERGIN for chemotherapy F/U.  Patient is doing well.  Denies n/v.  Denies any new side effects or symptoms.  Bowel and bladder functioning well.  Eating and drinking well.  Instructed to drink 64 oz minimum daily or at least the day before, of and after treatment.   Reports "sore throat this morning.  Checking my temperature every three hours.  My husband said I was snoring so throat may be dry.  Denies shortness of breath.  Discussed [facility] "Are you at risk" information.  MyChart patient portal not currently set up.  Asked if appointment is still scheduled tomorrow at 12:30 pm.  Confirmed.  Denies further questions or needs at this time.  Encouraged to call [phone] Mon -Fri 8:00 am - 4:30 pm or anytime as needed for symptoms, changes or event outside office hours.

## 2018-05-30 ENCOUNTER — Inpatient Hospital Stay: Payer: 59

## 2018-05-30 ENCOUNTER — Other Ambulatory Visit: Payer: Self-pay

## 2018-05-30 VITALS — BP 157/110 | HR 106 | Temp 98.3°F | Resp 18

## 2018-05-30 DIAGNOSIS — Z170 Estrogen receptor positive status [ER+]: Principal | ICD-10-CM

## 2018-05-30 DIAGNOSIS — C50412 Malignant neoplasm of upper-outer quadrant of left female breast: Secondary | ICD-10-CM | POA: Diagnosis not present

## 2018-05-30 MED ORDER — GOSERELIN ACETATE 3.6 MG ~~LOC~~ IMPL
DRUG_IMPLANT | SUBCUTANEOUS | Status: AC
  Filled 2018-05-30: qty 3.6

## 2018-05-30 MED ORDER — GOSERELIN ACETATE 3.6 MG ~~LOC~~ IMPL
3.6000 mg | DRUG_IMPLANT | Freq: Once | SUBCUTANEOUS | Status: AC
  Administered 2018-05-30: 3.6 mg via SUBCUTANEOUS

## 2018-05-30 NOTE — Patient Instructions (Signed)
Goserelin injection What is this medicine? GOSERELIN (GOE se rel in) is similar to a hormone found in the body. It lowers the amount of sex hormones that the body makes. Men will have lower testosterone levels and women will have lower estrogen levels while taking this medicine. In men, this medicine is used to treat prostate cancer; the injection is either given once per month or once every 12 weeks. A once per month injection (only) is used to treat women with endometriosis, dysfunctional uterine bleeding, or advanced breast cancer. This medicine may be used for other purposes; ask your health care provider or pharmacist if you have questions. COMMON BRAND NAME(S): Zoladex What should I tell my health care provider before I take this medicine? They need to know if you have any of these conditions (some only apply to women): -diabetes -heart disease or previous heart attack -high blood pressure -high cholesterol -kidney disease -osteoporosis or low bone density -problems passing urine -spinal cord injury -stroke -tobacco smoker -an unusual or allergic reaction to goserelin, hormone therapy, other medicines, foods, dyes, or preservatives -pregnant or trying to get pregnant -breast-feeding How should I use this medicine? This medicine is for injection under the skin. It is given by a health care professional in a hospital or clinic setting. Men receive this injection once every 4 weeks or once every 12 weeks. Women will only receive the once every 4 weeks injection. Talk to your pediatrician regarding the use of this medicine in children. Special care may be needed. Overdosage: If you think you have taken too much of this medicine contact a poison control center or emergency room at once. NOTE: This medicine is only for you. Do not share this medicine with others. What if I miss a dose? It is important not to miss your dose. Call your doctor or health care professional if you are unable to  keep an appointment. What may interact with this medicine? -female hormones like estrogen -herbal or dietary supplements like black cohosh, chasteberry, or DHEA -female hormones like testosterone -prasterone This list may not describe all possible interactions. Give your health care provider a list of all the medicines, herbs, non-prescription drugs, or dietary supplements you use. Also tell them if you smoke, drink alcohol, or use illegal drugs. Some items may interact with your medicine. What should I watch for while using this medicine? Visit your doctor or health care professional for regular checks on your progress. Your symptoms may appear to get worse during the first weeks of this therapy. Tell your doctor or healthcare professional if your symptoms do not start to get better or if they get worse after this time. Your bones may get weaker if you take this medicine for a long time. If you smoke or frequently drink alcohol you may increase your risk of bone loss. A family history of osteoporosis, chronic use of drugs for seizures (convulsions), or corticosteroids can also increase your risk of bone loss. Talk to your doctor about how to keep your bones strong. This medicine should stop regular monthly menstration in women. Tell your doctor if you continue to menstrate. Women should not become pregnant while taking this medicine or for 12 weeks after stopping this medicine. Women should inform their doctor if they wish to become pregnant or think they might be pregnant. There is a potential for serious side effects to an unborn child. Talk to your health care professional or pharmacist for more information. Do not breast-feed an infant while taking   this medicine. Men should inform their doctors if they wish to father a child. This medicine may lower sperm counts. Talk to your health care professional or pharmacist for more information. What side effects may I notice from receiving this  medicine? Side effects that you should report to your doctor or health care professional as soon as possible: -allergic reactions like skin rash, itching or hives, swelling of the face, lips, or tongue -bone pain -breathing problems -changes in vision -chest pain -feeling faint or lightheaded, falls -fever, chills -pain, swelling, warmth in the leg -pain, tingling, numbness in the hands or feet -signs and symptoms of low blood pressure like dizziness; feeling faint or lightheaded, falls; unusually weak or tired -stomach pain -swelling of the ankles, feet, hands -trouble passing urine or change in the amount of urine -unusually high or low blood pressure -unusually weak or tired Side effects that usually do not require medical attention (report to your doctor or health care professional if they continue or are bothersome): -change in sex drive or performance -changes in breast size in both males and females -changes in emotions or moods -headache -hot flashes -irritation at site where injected -loss of appetite -skin problems like acne, dry skin -vaginal dryness This list may not describe all possible side effects. Call your doctor for medical advice about side effects. You may report side effects to FDA at 1-800-FDA-1088. Where should I keep my medicine? This drug is given in a hospital or clinic and will not be stored at home. NOTE: This sheet is a summary. It may not cover all possible information. If you have questions about this medicine, talk to your doctor, pharmacist, or health care provider.  2019 Elsevier/Gold Standard (2013-04-29 11:10:35)  

## 2018-06-04 ENCOUNTER — Other Ambulatory Visit: Payer: Self-pay

## 2018-06-04 ENCOUNTER — Inpatient Hospital Stay: Payer: 59

## 2018-06-04 ENCOUNTER — Inpatient Hospital Stay: Payer: 59 | Admitting: Hematology and Oncology

## 2018-06-04 DIAGNOSIS — Z170 Estrogen receptor positive status [ER+]: Secondary | ICD-10-CM | POA: Diagnosis not present

## 2018-06-04 DIAGNOSIS — C773 Secondary and unspecified malignant neoplasm of axilla and upper limb lymph nodes: Secondary | ICD-10-CM

## 2018-06-04 DIAGNOSIS — C50412 Malignant neoplasm of upper-outer quadrant of left female breast: Secondary | ICD-10-CM | POA: Diagnosis not present

## 2018-06-04 DIAGNOSIS — D6481 Anemia due to antineoplastic chemotherapy: Secondary | ICD-10-CM

## 2018-06-04 DIAGNOSIS — F419 Anxiety disorder, unspecified: Secondary | ICD-10-CM

## 2018-06-04 DIAGNOSIS — Z79899 Other long term (current) drug therapy: Secondary | ICD-10-CM

## 2018-06-04 DIAGNOSIS — R5383 Other fatigue: Secondary | ICD-10-CM

## 2018-06-04 LAB — CBC WITH DIFFERENTIAL (CANCER CENTER ONLY)
Abs Immature Granulocytes: 0.1 10*3/uL — ABNORMAL HIGH (ref 0.00–0.07)
Basophils Absolute: 0.1 10*3/uL (ref 0.0–0.1)
Basophils Relative: 2 %
EOS ABS: 0.1 10*3/uL (ref 0.0–0.5)
Eosinophils Relative: 3 %
HCT: 36.9 % (ref 36.0–46.0)
Hemoglobin: 11.6 g/dL — ABNORMAL LOW (ref 12.0–15.0)
Immature Granulocytes: 2 %
Lymphocytes Relative: 26 %
Lymphs Abs: 1.4 10*3/uL (ref 0.7–4.0)
MCH: 28.9 pg (ref 26.0–34.0)
MCHC: 31.4 g/dL (ref 30.0–36.0)
MCV: 92 fL (ref 80.0–100.0)
Monocytes Absolute: 0.4 10*3/uL (ref 0.1–1.0)
Monocytes Relative: 7 %
Neutro Abs: 3.2 10*3/uL (ref 1.7–7.7)
Neutrophils Relative %: 60 %
Platelet Count: 275 10*3/uL (ref 150–400)
RBC: 4.01 MIL/uL (ref 3.87–5.11)
RDW: 12.5 % (ref 11.5–15.5)
WBC Count: 5.2 10*3/uL (ref 4.0–10.5)
nRBC: 0 % (ref 0.0–0.2)

## 2018-06-04 LAB — CMP (CANCER CENTER ONLY)
ALK PHOS: 115 U/L (ref 38–126)
ALT: 20 U/L (ref 0–44)
AST: 24 U/L (ref 15–41)
Albumin: 3.8 g/dL (ref 3.5–5.0)
Anion gap: 10 (ref 5–15)
BUN: 8 mg/dL (ref 6–20)
CO2: 25 mmol/L (ref 22–32)
Calcium: 9.2 mg/dL (ref 8.9–10.3)
Chloride: 102 mmol/L (ref 98–111)
Creatinine: 0.71 mg/dL (ref 0.44–1.00)
GFR, Est AFR Am: >60 mL/min (ref >60)
GFR, Estimated: >60 mL/min (ref >60)
Glucose, Bld: 106 mg/dL — ABNORMAL HIGH (ref 70–99)
Potassium: 4.2 mmol/L (ref 3.5–5.1)
Sodium: 137 mmol/L (ref 135–145)
Total Bilirubin: 0.2 mg/dL — ABNORMAL LOW (ref 0.3–1.2)
Total Protein: 7.4 g/dL (ref 6.5–8.1)

## 2018-06-04 NOTE — Assessment & Plan Note (Signed)
/  11/2018:Evaluation of 5 months of thickening of the left breast upper outer quadrant. 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 Several lymph nodes were identified by ultrasound. T2 N1 stage IIb clinical stage  Treatment plan: 1.Neoadjuvant chemotherapy with dose dense Adriamycin and Cytoxan followed by Taxol weekly x12 2.Breast conserving surgery with targeted node dissection if possible 3.Adjuvant radiation therapy 4.Followed by adjuvant antiestrogen therapy ---------------------------------------------------------------------- Echocardiogram 05/23/2018: EF 60 to 65% Labs reviewed  Chemo toxicities:  Return to clinic in 1 week for cycle 2

## 2018-06-04 NOTE — Progress Notes (Signed)
Patient Care Team: Bethany Olp, MD as PCP - General (Family Medicine)  DIAGNOSIS:    ICD-10-CM   1. Malignant neoplasm of upper-outer quadrant of left breast in female, estrogen receptor positive ([place]) C50.412    Z17.0     SUMMARY OF ONCOLOGIC HISTORY:   Malignant neoplasm of upper-outer quadrant of left breast in female, estrogen receptor positive ([place])   05/13/2018 Initial Diagnosis    Evaluation of 5 months of thickening of the left breast upper outer quadrant.  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  Several lymph nodes were identified by ultrasound.  T2 N1 stage IIb clinical stage    05/16/2018 Cancer Staging    Staging form: Breast, AJCC 8th Edition - Clinical stage from 05/16/2018: Stage IIB (cT2, cN1, cM0, G3, ER+, PR+, HER2-) - Signed by Bethany Lose, MD on 05/16/2018    05/28/2018 -  Neo-Adjuvant Chemotherapy    Neoadjuvant chemotherapy with dose dense Adriamycin and Cytoxan followed by Taxol weekly x12     CHIEF COMPLIANT: Cycle 1 Day 8 Adriamycin and Cytoxan  INTERVAL HISTORY: Bethany Hanna is a 38 y.o. with above-mentioned history of left breast cancer currently on neoadjuvant chemotherapy with dose dense Adriamycin and Cytoxan. She presents to the clinic alone today for a toxicity check.  She reports no side effects to chemotherapy.  She denies any nausea or vomiting.  She felt fatigued for a couple of days but it recovered.  REVIEW OF SYSTEMS:   Constitutional: Denies fevers, chills or abnormal weight loss Eyes: Denies blurriness of vision Ears, nose, mouth, throat, and face: Denies mucositis or sore throat Respiratory: Denies cough, dyspnea or wheezes Cardiovascular: Denies palpitation, chest discomfort  Gastrointestinal: Denies nausea, heartburn or change in bowel habits Skin: Denies abnormal skin rashes Lymphatics: Denies new lymphadenopathy or easy bruising Neurological: Denies numbness, tingling or new weaknesses Behavioral/Psych: Mood is stable, no new changes  Extremities: No lower extremity edema Breast: denies any pain or lumps or nodules in either breasts All other systems were reviewed with the patient and are negative.  I have reviewed the past medical history, past surgical history, social history and family history with the patient and they are unchanged from previous note.  ALLERGIES:  has No Known Allergies.  MEDICATIONS:  Current Outpatient Medications  Medication Sig Dispense Refill  . acetaminophen (TYLENOL) 500 MG tablet Take 1,000 mg by mouth every 6 (six) hours as needed for moderate pain.     Marland Kitchen ALPRAZolam (XANAX) 0.5 MG tablet Take 0.5 mg by mouth at bedtime as needed for anxiety.     Marland Kitchen dexamethasone (DECADRON) 4 MG tablet Take 1 tablet day after chemo and 1 tablet 2 days after chemo with food 8 tablet 0  . folic acid (FOLVITE) 1 MG tablet Take 1 mg by mouth daily.    Marland Kitchen lidocaine-prilocaine (EMLA) cream Apply to affected area once 30 g 3  . LORazepam (ATIVAN) 0.5 MG tablet Take 1 tablet (0.5 mg total) by mouth at bedtime as needed (Nausea or vomiting). 30 tablet 0  . ondansetron (ZOFRAN) 8 MG tablet Take 1 tablet (8 mg total) by mouth 2 (two) times daily as needed. Start on the third day after chemotherapy. 30 tablet 1  . prochlorperazine (COMPAZINE) 10 MG tablet Take 1 tablet (  10 mg total) by mouth every 6 (six) hours as needed (Nausea or vomiting). 30 tablet 1  . traMADol (ULTRAM) 50 MG tablet Take 50 mg by mouth every 6 (six) hours as needed for moderate pain.     No current facility-administered medications for this visit.     PHYSICAL EXAMINATION: ECOG PERFORMANCE STATUS: 1 - Symptomatic but completely ambulatory  Vitals:   06/04/18 1439  BP: (!) 136/94   Pulse: (!) 108  Resp: 18  Temp: 97.6 F (36.4 C)  SpO2: 100%   Filed Weights   06/04/18 1439  Weight: 205 lb 4.8 oz (93.1 kg)    GENERAL: alert, no distress and comfortable SKIN: skin color, texture, turgor are normal, no rashes or significant lesions EYES: normal, Conjunctiva are pink and non-injected, sclera clear OROPHARYNX: no exudate, no erythema and lips, buccal mucosa, and tongue normal  NECK: supple, thyroid normal size, non-tender, without nodularity LYMPH: no palpable lymphadenopathy in the cervical, axillary or inguinal LUNGS: clear to auscultation and percussion with normal breathing effort HEART: regular rate & rhythm and no murmurs and no lower extremity edema ABDOMEN: abdomen soft, non-tender and normal bowel sounds MUSCULOSKELETAL: no cyanosis of digits and no clubbing  NEURO: alert & oriented x 3 with fluent speech, no focal motor/sensory deficits EXTREMITIES: No lower extremity edema  LABORATORY DATA:  I have reviewed the data as listed CMP Latest Ref Rng & Units 05/28/2018 03/01/2016 04/09/2015  Glucose 70 - 99 mg/dL 107(H) 79 111(H)  BUN 6 - 20 mg/dL 7 <5(L) 7  Creatinine 0.44 - 1.00 mg/dL 0.69 0.55 0.53  Sodium 135 - 145 mmol/L 138 134(L) 140  Potassium 3.5 - 5.1 mmol/L 3.7 3.4(L) 4.5  Chloride 98 - 111 mmol/L 104 105 109  CO2 22 - 32 mmol/L 22 21(L) 24  Calcium 8.9 - 10.3 mg/dL 8.6(L) 8.0(L) 8.8(L)  Total Protein 6.5 - 8.1 g/dL 7.4 5.9(L) 6.9  Total Bilirubin 0.3 - 1.2 mg/dL 0.4 0.3 0.7  Alkaline Phos 38 - 126 U/L 82 120 101  AST 15 - 41 U/L 12(L) 14(L) 93(H)  ALT 0 - 44 U/L 13 12(L) 106(H)    Lab Results  Component Value Date   WBC 5.2 06/04/2018   HGB 11.6 (L) 06/04/2018   HCT 36.9 06/04/2018   MCV 92.0 06/04/2018   PLT 275 06/04/2018   NEUTROABS 3.2 06/04/2018    ASSESSMENT & PLAN:  Malignant neoplasm of upper-outer quadrant of left breast in female, estrogen receptor positive ([name]) /11/2018:Evaluation of 5 months of thickening of the left  breast upper outer quadrant. 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 Several lymph nodes were identified by ultrasound. T2 N1 stage IIb clinical stage  Treatment plan: 1.Neoadjuvant chemotherapy with dose dense Adriamycin and Cytoxan followed by Taxol weekly x12 2.Breast conserving surgery with targeted node dissection if possible 3.Adjuvant radiation therapy 4.Followed by adjuvant antiestrogen therapy ---------------------------------------------------------------------- Echocardiogram 05/23/2018: EF 60 to 65% Labs reviewed  Chemo toxicities: Denies any nausea or vomiting. Mild fatigue Mild anemia  Return to clinic in 1 week for cycle 2    No orders of the defined types were placed in this encounter.  The patient has a good understanding of the overall plan. she agrees with it. she will call with any problems that may develop  before the next visit here.  Bethany Lose, MD 06/04/2018  Julious Oka Dorshimer am acting as scribe for Dr. Nicholas Hanna.  I have reviewed the above documentation for accuracy and completeness, and I agree with the above.

## 2018-06-10 ENCOUNTER — Encounter: Payer: Self-pay | Admitting: Hematology and Oncology

## 2018-06-10 NOTE — Progress Notes (Signed)
Called pt on 05/28/18 to introduce myself as her Arboriculturist and to discuss copay assistance.  Pt stated she thinks she has met her deductible and her ins should pay for her treatment at 100%.  She will check w/ her ins and call me back if she would like to apply for copay assistance.

## 2018-06-10 NOTE — Progress Notes (Signed)
Patient Care Team: [name], MD as PCP - General (Family Medicine)  DIAGNOSIS:    ICD-10-CM   1. Malignant neoplasm of upper-outer quadrant of left breast in female, estrogen receptor positive ([place]) C50.412    Z17.0     SUMMARY OF ONCOLOGIC HISTORY:   Malignant neoplasm of upper-outer quadrant of left breast in female, estrogen receptor positive ([place])   05/13/2018 Initial Diagnosis    Evaluation of 5 months of thickening of the left breast upper outer quadrant.  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  Several lymph nodes were identified by ultrasound.  T2 N1 stage IIb clinical stage    05/16/2018 Cancer Staging    Staging form: Breast, AJCC 8th Edition - Clinical stage from 05/16/2018: Stage IIB (cT2, cN1, cM0, G3, ER+, PR+, HER2-) - Signed by [name], MD on 05/16/2018    05/28/2018 -  Neo-Adjuvant Chemotherapy    Neoadjuvant chemotherapy with dose dense Adriamycin and Cytoxan followed by Taxol weekly x12     CHIEF COMPLIANT: Cycle 2 Adriamycin and Cytoxan  INTERVAL HISTORY: Bethany Hanna is a 38 y.o. with above-mentioned history of left breast cancer currently on neoadjuvant chemotherapy with dose dense Adriamycin and Cytoxan. She presents to the clinic alone today for cycle 2.  She has noticed a hair loss.  Denies any nausea vomiting denies any fevers or chills.  REVIEW OF SYSTEMS:   Constitutional: Denies fevers, chills or abnormal weight loss Eyes: Denies blurriness of vision Ears, nose, mouth, throat, and face: Denies mucositis or sore throat Respiratory: Denies cough, dyspnea or wheezes Cardiovascular: Denies palpitation, chest discomfort Gastrointestinal: Denies nausea, heartburn or change in bowel habits  Skin: Denies abnormal skin rashes Lymphatics: Denies new lymphadenopathy or easy bruising Neurological: Denies numbness, tingling or new weaknesses Behavioral/Psych: Mood is stable, no new changes  Extremities: No lower extremity edema Breast: denies any pain or lumps or nodules in either breasts All other systems were reviewed with the patient and are negative.  I have reviewed the past medical history, past surgical history, social history and family history with the patient and they are unchanged from previous note.  ALLERGIES:  has No Known Allergies.  MEDICATIONS:  Current Outpatient Medications  Medication Sig Dispense Refill  . acetaminophen (TYLENOL) 500 MG tablet Take 1,000 mg by mouth every 6 (six) hours as needed for moderate pain.     Marland Kitchen ALPRAZolam (XANAX) 0.5 MG tablet Take 0.5 mg by mouth at bedtime as needed for anxiety.     Marland Kitchen dexamethasone (DECADRON) 4 MG tablet Take 1 tablet day after chemo and 1 tablet 2 days after chemo with food 8 tablet 0  . folic acid (FOLVITE) 1 MG tablet Take 1 mg by mouth daily.    Marland Kitchen lidocaine-prilocaine (EMLA) cream Apply to affected area once 30 g 3  . LORazepam (ATIVAN) 0.5 MG tablet Take 1 tablet (0.5 mg total) by mouth at bedtime as needed (Nausea or vomiting). 30 tablet 0  . ondansetron (ZOFRAN) 8 MG tablet Take 1 tablet (8 mg total) by mouth 2 (two) times daily as needed. Start on the third day after chemotherapy. 30 tablet 1  . prochlorperazine (COMPAZINE) 10 MG tablet Take 1 tablet (10 mg total) by mouth every 6 (six) hours as needed (Nausea or  vomiting). 30 tablet 1  . traMADol (ULTRAM) 50 MG tablet Take 50 mg by mouth every 6 (six) hours as needed for moderate pain.     No current facility-administered medications for this visit.     PHYSICAL EXAMINATION: ECOG PERFORMANCE STATUS: 1 - Symptomatic but completely ambulatory  Vitals:   06/11/18 1107  BP: (!) 129/98  Pulse: 93  Resp: 17  Temp: 97.6 F (36.4 C)  SpO2: 100%   Filed  Weights   06/11/18 1107  Weight: 203 lb 11.2 oz (92.4 kg)    GENERAL: alert, no distress and comfortable SKIN: skin color, texture, turgor are normal, no rashes or significant lesions EYES: normal, Conjunctiva are pink and non-injected, sclera clear OROPHARYNX: no exudate, no erythema and lips, buccal mucosa, and tongue normal  NECK: supple, thyroid normal size, non-tender, without nodularity LYMPH: no palpable lymphadenopathy in the cervical, axillary or inguinal LUNGS: clear to auscultation and percussion with normal breathing effort HEART: regular rate & rhythm and no murmurs and no lower extremity edema ABDOMEN: abdomen soft, non-tender and normal bowel sounds MUSCULOSKELETAL: no cyanosis of digits and no clubbing  NEURO: alert & oriented x 3 with fluent speech, no focal motor/sensory deficits EXTREMITIES: No lower extremity edema  LABORATORY DATA:  I have reviewed the data as listed CMP Latest Ref Rng & Units 06/04/2018 05/28/2018 03/01/2016  Glucose 70 - 99 mg/dL 106(H) 107(H) 79  BUN 6 - 20 mg/dL 8 7 <5(L)  Creatinine 0.44 - 1.00 mg/dL 0.71 0.69 0.55  Sodium 135 - 145 mmol/L 137 138 134(L)  Potassium 3.5 - 5.1 mmol/L 4.2 3.7 3.4(L)  Chloride 98 - 111 mmol/L 102 104 105  CO2 22 - 32 mmol/L 25 22 21(L)  Calcium 8.9 - 10.3 mg/dL 9.2 8.6(L) 8.0(L)  Total Protein 6.5 - 8.1 g/dL 7.4 7.4 5.9(L)  Total Bilirubin 0.3 - 1.2 mg/dL 0.2(L) 0.4 0.3  Alkaline Phos 38 - 126 U/L 115 82 120  AST 15 - 41 U/L 24 12(L) 14(L)  ALT 0 - 44 U/L 20 13 12(L)    Lab Results  Component Value Date   WBC 16.8 (H) 06/11/2018   HGB 12.6 06/11/2018   HCT 39.0 06/11/2018   MCV 90.5 06/11/2018   PLT 355 06/11/2018   NEUTROABS PENDING 06/11/2018    ASSESSMENT & PLAN:  Malignant neoplasm of upper-outer quadrant of left breast in female, estrogen receptor positive ([place]) 05/13/2018:Evaluation of 5 months of thickening of the left breast upper outer quadrant. 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 Several lymph nodes were identified by ultrasound. T2 N1 stage IIb clinical stage  Treatment plan: 1.Neoadjuvant chemotherapy with dose dense Adriamycin and Cytoxan followed by Taxol weekly x12 2.Breast conserving surgery with targeted node dissection if possible 3.Adjuvant radiation therapy 4.Followed by adjuvant antiestrogen therapy ---------------------------------------------------------------------- Echocardiogram 05/23/2018: EF 60 to 65% Labs reviewed  Chemo toxicities: Denies any nausea or vomiting. Mild fatigue Mild anemia  Return to clinic in 2 weeks for cycle 3  No orders of the defined types were placed in this encounter.  The patient has a good understanding of the overall plan. she agrees with it. she will call with any problems that may develop before the next visit here.  [name], MD 06/11/2018  Julious Oka Dorshimer am acting  as scribe for Dr. [name].  I have reviewed the above documentation for accuracy and completeness, and I agree with the above.

## 2018-06-11 ENCOUNTER — Other Ambulatory Visit: Payer: Self-pay

## 2018-06-11 ENCOUNTER — Inpatient Hospital Stay (HOSPITAL_BASED_OUTPATIENT_CLINIC_OR_DEPARTMENT_OTHER): Payer: 59 | Admitting: Hematology and Oncology

## 2018-06-11 ENCOUNTER — Inpatient Hospital Stay: Payer: 59 | Attending: Hematology and Oncology

## 2018-06-11 ENCOUNTER — Encounter: Payer: Self-pay | Admitting: Hematology and Oncology

## 2018-06-11 ENCOUNTER — Inpatient Hospital Stay: Payer: 59

## 2018-06-11 DIAGNOSIS — C50412 Malignant neoplasm of upper-outer quadrant of left female breast: Secondary | ICD-10-CM | POA: Diagnosis not present

## 2018-06-11 DIAGNOSIS — Z79899 Other long term (current) drug therapy: Secondary | ICD-10-CM

## 2018-06-11 DIAGNOSIS — C773 Secondary and unspecified malignant neoplasm of axilla and upper limb lymph nodes: Secondary | ICD-10-CM | POA: Insufficient documentation

## 2018-06-11 DIAGNOSIS — Z5111 Encounter for antineoplastic chemotherapy: Secondary | ICD-10-CM | POA: Diagnosis not present

## 2018-06-11 DIAGNOSIS — Z170 Estrogen receptor positive status [ER+]: Secondary | ICD-10-CM | POA: Insufficient documentation

## 2018-06-11 DIAGNOSIS — L658 Other specified nonscarring hair loss: Secondary | ICD-10-CM | POA: Insufficient documentation

## 2018-06-11 DIAGNOSIS — Z95828 Presence of other vascular implants and grafts: Secondary | ICD-10-CM | POA: Insufficient documentation

## 2018-06-11 DIAGNOSIS — Z5189 Encounter for other specified aftercare: Secondary | ICD-10-CM | POA: Diagnosis not present

## 2018-06-11 LAB — CBC WITH DIFFERENTIAL (CANCER CENTER ONLY)
Abs Immature Granulocytes: 1.79 10*3/uL — ABNORMAL HIGH (ref 0.00–0.07)
Basophils Absolute: 0.2 10*3/uL — ABNORMAL HIGH (ref 0.0–0.1)
Basophils Relative: 1 %
Eosinophils Absolute: 0.1 10*3/uL (ref 0.0–0.5)
Eosinophils Relative: 1 %
HCT: 39 % (ref 36.0–46.0)
Hemoglobin: 12.6 g/dL (ref 12.0–15.0)
Immature Granulocytes: 11 %
Lymphocytes Relative: 12 %
Lymphs Abs: 2.1 10*3/uL (ref 0.7–4.0)
MCH: 29.2 pg (ref 26.0–34.0)
MCHC: 32.3 g/dL (ref 30.0–36.0)
MCV: 90.5 fL (ref 80.0–100.0)
Monocytes Absolute: 1 10*3/uL (ref 0.1–1.0)
Monocytes Relative: 6 %
Neutro Abs: 11.7 10*3/uL — ABNORMAL HIGH (ref 1.7–7.7)
Neutrophils Relative %: 69 %
Platelet Count: 355 10*3/uL (ref 150–400)
RBC: 4.31 MIL/uL (ref 3.87–5.11)
RDW: 12.9 % (ref 11.5–15.5)
WBC Count: 16.8 10*3/uL — ABNORMAL HIGH (ref 4.0–10.5)
nRBC: 0.1 % (ref 0.0–0.2)

## 2018-06-11 LAB — CMP (CANCER CENTER ONLY)
ALT: 14 U/L (ref 0–44)
AST: 13 U/L — ABNORMAL LOW (ref 15–41)
Albumin: 4.2 g/dL (ref 3.5–5.0)
Alkaline Phosphatase: 109 U/L (ref 38–126)
Anion gap: 10 (ref 5–15)
BUN: 7 mg/dL (ref 6–20)
CO2: 24 mmol/L (ref 22–32)
Calcium: 9.5 mg/dL (ref 8.9–10.3)
Chloride: 105 mmol/L (ref 98–111)
Creatinine: 0.72 mg/dL (ref 0.44–1.00)
GFR, Est AFR Am: >60 mL/min (ref >60)
GFR, Estimated: >60 mL/min (ref >60)
Glucose, Bld: 97 mg/dL (ref 70–99)
Potassium: 4 mmol/L (ref 3.5–5.1)
Sodium: 139 mmol/L (ref 135–145)
Total Bilirubin: <0.2 mg/dL — ABNORMAL LOW (ref 0.3–1.2)
Total Protein: 7.7 g/dL (ref 6.5–8.1)

## 2018-06-11 LAB — PREGNANCY, URINE: Preg Test, Ur: NEGATIVE

## 2018-06-11 MED ORDER — SODIUM CHLORIDE 0.9 % IV SOLN
600.0000 mg/m2 | Freq: Once | INTRAVENOUS | Status: AC
  Administered 2018-06-11: 1280 mg via INTRAVENOUS
  Filled 2018-06-11: qty 64

## 2018-06-11 MED ORDER — PEGFILGRASTIM 6 MG/0.6ML ~~LOC~~ PSKT
6.0000 mg | PREFILLED_SYRINGE | Freq: Once | SUBCUTANEOUS | Status: AC
  Administered 2018-06-11: 6 mg via SUBCUTANEOUS

## 2018-06-11 MED ORDER — PALONOSETRON HCL INJECTION 0.25 MG/5ML
INTRAVENOUS | Status: AC
  Filled 2018-06-11: qty 5

## 2018-06-11 MED ORDER — DOXORUBICIN HCL CHEMO IV INJECTION 2 MG/ML
60.0000 mg/m2 | Freq: Once | INTRAVENOUS | Status: AC
  Administered 2018-06-11: 128 mg via INTRAVENOUS
  Filled 2018-06-11: qty 64

## 2018-06-11 MED ORDER — PEGFILGRASTIM 6 MG/0.6ML ~~LOC~~ PSKT
PREFILLED_SYRINGE | SUBCUTANEOUS | Status: AC
  Filled 2018-06-11: qty 0.6

## 2018-06-11 MED ORDER — SODIUM CHLORIDE 0.9 % IV SOLN
Freq: Once | INTRAVENOUS | Status: AC
  Administered 2018-06-11: 13:00:00 via INTRAVENOUS
  Filled 2018-06-11: qty 5

## 2018-06-11 MED ORDER — HEPARIN SOD (PORK) LOCK FLUSH 100 UNIT/ML IV SOLN
500.0000 [IU] | Freq: Once | INTRAVENOUS | Status: AC | PRN
  Administered 2018-06-11: 500 [IU]
  Filled 2018-06-11: qty 5

## 2018-06-11 MED ORDER — SODIUM CHLORIDE 0.9% FLUSH
10.0000 mL | INTRAVENOUS | Status: DC | PRN
  Administered 2018-06-11: 10 mL
  Filled 2018-06-11: qty 10

## 2018-06-11 MED ORDER — SODIUM CHLORIDE 0.9 % IV SOLN
Freq: Once | INTRAVENOUS | Status: AC
  Administered 2018-06-11: 12:00:00 via INTRAVENOUS
  Filled 2018-06-11: qty 250

## 2018-06-11 MED ORDER — PALONOSETRON HCL INJECTION 0.25 MG/5ML
0.2500 mg | Freq: Once | INTRAVENOUS | Status: AC
  Administered 2018-06-11: 0.25 mg via INTRAVENOUS

## 2018-06-11 NOTE — Patient Instructions (Signed)
[facility] Discharge Instructions for Patients Receiving Chemotherapy  Today you received the following chemotherapy agents Doxorubicin and Cytoxan  To help prevent nausea and vomiting after your treatment, we encourage you to take your nausea medication as prescribed by MD.   If you develop nausea and vomiting that is not controlled by your nausea medication, call the clinic.   BELOW ARE SYMPTOMS THAT SHOULD BE REPORTED IMMEDIATELY:  *FEVER GREATER THAN 100.5 F  *CHILLS WITH OR WITHOUT FEVER  NAUSEA AND VOMITING THAT IS NOT CONTROLLED WITH YOUR NAUSEA MEDICATION  *UNUSUAL SHORTNESS OF BREATH  *UNUSUAL BRUISING OR BLEEDING  TENDERNESS IN MOUTH AND THROAT WITH OR WITHOUT PRESENCE OF ULCERS  *URINARY PROBLEMS  *BOWEL PROBLEMS  UNUSUAL RASH Items with * indicate a potential emergency and should be followed up as soon as possible.  Feel free to call the clinic should you have any questions or concerns. The clinic phone number is [phone].  Please show the [place] at check-in to the Emergency Department and triage nurse.

## 2018-06-11 NOTE — Assessment & Plan Note (Signed)
05/13/2018:Evaluation of 5 months of thickening of the left breast upper outer quadrant. 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 Several lymph nodes were identified by ultrasound. T2 N1 stage IIb clinical stage  Treatment plan: 1.Neoadjuvant chemotherapy with dose dense Adriamycin and Cytoxan followed by Taxol weekly x12 2.Breast conserving surgery with targeted node dissection if possible 3.Adjuvant radiation therapy 4.Followed by adjuvant antiestrogen therapy ---------------------------------------------------------------------- Echocardiogram 05/23/2018: EF 60 to 65% Labs reviewed  Chemo toxicities: Denies any nausea or vomiting. Mild fatigue Mild anemia  Return to clinic in 2 weeks for cycle 3 

## 2018-06-11 NOTE — Progress Notes (Signed)
I also offered the [employer] but pt declined at this time.  She has my card in case she changes her mind and for any questions or concerns she may have in the future.

## 2018-06-24 NOTE — Progress Notes (Signed)
Patient Care Team: [name], MD as PCP - General (Family Medicine)  DIAGNOSIS:    ICD-10-CM   1. Malignant neoplasm of upper-outer quadrant of left breast in female, estrogen receptor positive ([place]) C50.412    Z17.0     SUMMARY OF ONCOLOGIC HISTORY:   Malignant neoplasm of upper-outer quadrant of left breast in female, estrogen receptor positive ([place])   05/13/2018 Initial Diagnosis    Evaluation of 5 months of thickening of the left breast upper outer quadrant.  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  Several lymph nodes were identified by ultrasound.  T2 N1 stage IIb clinical stage    05/16/2018 Cancer Staging    Staging form: Breast, AJCC 8th Edition - Clinical stage from 05/16/2018: Stage IIB (cT2, cN1, cM0, G3, ER+, PR+, HER2-) - Signed by [name], MD on 05/16/2018    05/28/2018 -  Neo-Adjuvant Chemotherapy    Neoadjuvant chemotherapy with dose dense Adriamycin and Cytoxan followed by Taxol weekly x12     CHIEF COMPLIANT: Cycle 3 Adriamycin and Cytoxan  INTERVAL HISTORY: Bethany Hanna is a 38 y.o. with above-mentioned history of left breast cancercurrently on neoadjuvant chemotherapy with dose dense Adriamycin and Cytoxan. She presents to the clinic alone todayfor cycle 3.  She shaved her head because her hair completely fell out.  She feels much better since the hair is gone.  REVIEW OF SYSTEMS:   Constitutional: Denies fevers, chills or abnormal weight loss Eyes: Denies blurriness of vision Ears, nose, mouth, throat, and face: Denies mucositis or sore throat Respiratory: Denies cough, dyspnea or wheezes Cardiovascular: Denies palpitation, chest discomfort Gastrointestinal: Denies nausea, heartburn or change  in bowel habits Skin: Denies abnormal skin rashes Lymphatics: Denies new lymphadenopathy or easy bruising Neurological: Denies numbness, tingling or new weaknesses Behavioral/Psych: Mood is stable, no new changes  Extremities: No lower extremity edema Breast: denies any pain or lumps or nodules in either breasts All other systems were reviewed with the patient and are negative.  I have reviewed the past medical history, past surgical history, social history and family history with the patient and they are unchanged from previous note.  ALLERGIES:  has No Known Allergies.  MEDICATIONS:  Current Outpatient Medications  Medication Sig Dispense Refill  . acetaminophen (TYLENOL) 500 MG tablet Take 1,000 mg by mouth every 6 (six) hours as needed for moderate pain.     Marland Kitchen ALPRAZolam (XANAX) 0.5 MG tablet Take 0.5 mg by mouth at bedtime as needed for anxiety.     Marland Kitchen dexamethasone (DECADRON) 4 MG tablet Take 1 tablet day after chemo and 1 tablet 2 days after chemo with food 8 tablet 0  . folic acid (FOLVITE) 1 MG tablet Take 1 mg by mouth daily.    Marland Kitchen lidocaine-prilocaine (EMLA) cream Apply to affected area once 30 g 3  . LORazepam (ATIVAN) 0.5 MG tablet Take 1 tablet (0.5 mg total) by mouth at bedtime as needed (Nausea or vomiting). 30 tablet 0  . mupirocin ointment (BACTROBAN) 2 % Apply to area 3 times daily until healed 30 g 0  . ondansetron (ZOFRAN) 8 MG tablet Take 1 tablet (8 mg total) by mouth 2 (two) times daily as needed. Start on the third day after chemotherapy. 30 tablet 1  . prochlorperazine (  COMPAZINE) 10 MG tablet Take 1 tablet (10 mg total) by mouth every 6 (six) hours as needed (Nausea or vomiting). 30 tablet 1  . traMADol (ULTRAM) 50 MG tablet Take 50 mg by mouth every 6 (six) hours as needed for moderate pain.     No current facility-administered medications for this visit.    Facility-Administered Medications Ordered in Other Visits  Medication Dose Route Frequency Provider Last  Rate Last Dose  . cyclophosphamide (CYTOXAN) 1,280 mg in sodium chloride 0.9 % 250 mL chemo infusion  600 mg/m2 (Treatment Plan Recorded) Intravenous Once [name], MD      . DOXOrubicin (ADRIAMYCIN) chemo injection 128 mg  60 mg/m2 (Treatment Plan Recorded) Intravenous Once [name], MD      . fosaprepitant (EMEND) 150 mg, dexamethasone (DECADRON) 12 mg in sodium chloride 0.9 % 145 mL IVPB   Intravenous Once [name], MD      . goserelin (ZOLADEX) injection 3.6 mg  3.6 mg Subcutaneous Once [name], MD      . heparin lock flush 100 unit/mL  500 Units Intracatheter Once PRN [name], MD      . palonosetron (ALOXI) injection 0.25 mg  0.25 mg Intravenous Once [name], MD      . pegfilgrastim (NEULASTA ONPRO KIT) injection 6 mg  6 mg Subcutaneous Once [name], MD      . sodium chloride flush (NS) 0.9 % injection 10 mL  10 mL Intracatheter PRN [name], MD        PHYSICAL EXAMINATION: ECOG PERFORMANCE STATUS: 1 - Symptomatic but completely ambulatory  Vitals:   06/25/18 1044  BP: 124/67  Pulse: (!) 111  Resp: 18  Temp: 98.1 F (36.7 C)  SpO2: 100%   Filed Weights   06/25/18 1044  Weight: 204 lb 1.6 oz (92.6 kg)    GENERAL: alert, no distress and comfortable SKIN: skin color, texture, turgor are normal, no rashes or significant lesions EYES: normal, Conjunctiva are pink and non-injected, sclera clear OROPHARYNX: no exudate, no erythema and lips, buccal mucosa, and tongue normal  NECK: supple, thyroid normal size, non-tender, without nodularity LYMPH: no palpable lymphadenopathy in the cervical, axillary or inguinal LUNGS: clear to auscultation and percussion with normal breathing effort HEART: regular rate & rhythm and no murmurs and no lower extremity edema ABDOMEN: abdomen soft, non-tender and normal bowel sounds MUSCULOSKELETAL: no cyanosis of digits and no clubbing  NEURO: alert & oriented x 3 with fluent speech, no focal motor/sensory  deficits EXTREMITIES: No lower extremity edema  LABORATORY DATA:  I have reviewed the data as listed CMP Latest Ref Rng & Units 06/25/2018 06/11/2018 06/04/2018  Glucose 70 - 99 mg/dL 99 97 106(H)  BUN 6 - 20 mg/dL 9 7 8   Creatinine 0.44 - 1.00 mg/dL 0.69 0.72 0.71  Sodium 135 - 145 mmol/L 136 139 137  Potassium 3.5 - 5.1 mmol/L 4.2 4.0 4.2  Chloride 98 - 111 mmol/L 107 105 102  CO2 22 - 32 mmol/L 20(L) 24 25  Calcium 8.9 - 10.3 mg/dL 8.9 9.5 9.2  Total Protein 6.5 - 8.1 g/dL 7.4 7.7 7.4  Total Bilirubin 0.3 - 1.2 mg/dL <0.2(L) <0.2(L) 0.2(L)  Alkaline Phos 38 - 126 U/L 101 109 115  AST 15 - 41 U/L 14(L) 13(L) 24  ALT 0 - 44 U/L 14 14 20     Lab Results  Component Value Date   WBC 23.9 (H) 06/25/2018   HGB 12.3 06/25/2018   HCT 36.8 06/25/2018  MCV 87.8 06/25/2018   PLT 304 06/25/2018   NEUTROABS 19.2 (H) 06/25/2018    ASSESSMENT & PLAN:  Malignant neoplasm of upper-outer quadrant of left breast in female, estrogen receptor positive ([place]) 05/13/2018:Evaluation of 5 months of thickening of the left breast upper outer quadrant. 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 Several lymph nodes were identified by ultrasound. T2 N1 stage IIb clinical stage  Treatment plan: 1.Neoadjuvant chemotherapy with dose dense Adriamycin and Cytoxan followed by Taxol weekly x12 2.Breast conserving surgery with targeted node dissection if possible 3.Adjuvant radiation therapy 4.Followed by adjuvant antiestrogen therapy ---------------------------------------------------------------------- Current treatment: Cycle 3 dose dense Adriamycin and Cytoxan Echocardiogram 05/23/2018: EF 60 to 65% Labs reviewed  Chemo toxicities: Denies any nausea  or vomiting. Complete loss of hair Mild fatigue Mild anemia  Return to clinic in 2 weeks for cycle 4    No orders of the defined types were placed in this encounter.  The patient has a good understanding of the overall plan. she agrees with it. she will call with any problems that may develop before the next visit here.  [name], MD 06/25/2018  Julious Oka Dorshimer am acting as scribe for Dr. [name].  I have reviewed the above documentation for accuracy and completeness, and I agree with the above.

## 2018-06-25 ENCOUNTER — Inpatient Hospital Stay (HOSPITAL_BASED_OUTPATIENT_CLINIC_OR_DEPARTMENT_OTHER): Payer: 59 | Admitting: Medical

## 2018-06-25 ENCOUNTER — Inpatient Hospital Stay: Payer: 59

## 2018-06-25 ENCOUNTER — Other Ambulatory Visit: Payer: Self-pay

## 2018-06-25 ENCOUNTER — Inpatient Hospital Stay (HOSPITAL_BASED_OUTPATIENT_CLINIC_OR_DEPARTMENT_OTHER): Payer: 59 | Admitting: Hematology and Oncology

## 2018-06-25 ENCOUNTER — Other Ambulatory Visit: Payer: Self-pay | Admitting: Medical

## 2018-06-25 VITALS — HR 96

## 2018-06-25 DIAGNOSIS — C50412 Malignant neoplasm of upper-outer quadrant of left female breast: Secondary | ICD-10-CM

## 2018-06-25 DIAGNOSIS — Z170 Estrogen receptor positive status [ER+]: Secondary | ICD-10-CM

## 2018-06-25 DIAGNOSIS — L658 Other specified nonscarring hair loss: Secondary | ICD-10-CM | POA: Diagnosis not present

## 2018-06-25 DIAGNOSIS — C773 Secondary and unspecified malignant neoplasm of axilla and upper limb lymph nodes: Secondary | ICD-10-CM

## 2018-06-25 DIAGNOSIS — T148XXA Other injury of unspecified body region, initial encounter: Secondary | ICD-10-CM

## 2018-06-25 DIAGNOSIS — Z5111 Encounter for antineoplastic chemotherapy: Secondary | ICD-10-CM | POA: Diagnosis not present

## 2018-06-25 DIAGNOSIS — Z79899 Other long term (current) drug therapy: Secondary | ICD-10-CM

## 2018-06-25 DIAGNOSIS — Z95828 Presence of other vascular implants and grafts: Secondary | ICD-10-CM

## 2018-06-25 LAB — CMP (CANCER CENTER ONLY)
ALT: 14 U/L (ref 0–44)
AST: 14 U/L — ABNORMAL LOW (ref 15–41)
Albumin: 4.1 g/dL (ref 3.5–5.0)
Alkaline Phosphatase: 101 U/L (ref 38–126)
Anion gap: 9 (ref 5–15)
BUN: 9 mg/dL (ref 6–20)
CO2: 20 mmol/L — ABNORMAL LOW (ref 22–32)
Calcium: 8.9 mg/dL (ref 8.9–10.3)
Chloride: 107 mmol/L (ref 98–111)
Creatinine: 0.69 mg/dL (ref 0.44–1.00)
GFR, Est AFR Am: >60 mL/min (ref >60)
GFR, Estimated: >60 mL/min (ref >60)
Glucose, Bld: 99 mg/dL (ref 70–99)
Potassium: 4.2 mmol/L (ref 3.5–5.1)
Sodium: 136 mmol/L (ref 135–145)
Total Bilirubin: <0.2 mg/dL — ABNORMAL LOW (ref 0.3–1.2)
Total Protein: 7.4 g/dL (ref 6.5–8.1)

## 2018-06-25 LAB — CBC WITH DIFFERENTIAL (CANCER CENTER ONLY)
Abs Immature Granulocytes: 1.8 10*3/uL — ABNORMAL HIGH (ref 0.00–0.07)
Basophils Absolute: 0 10*3/uL (ref 0.0–0.1)
Basophils Relative: 0 %
Eosinophils Absolute: 0 10*3/uL (ref 0.0–0.5)
Eosinophils Relative: 0 %
HCT: 36.8 % (ref 36.0–46.0)
Hemoglobin: 12.3 g/dL (ref 12.0–15.0)
Immature Granulocytes: 8 %
Lymphocytes Relative: 7 %
Lymphs Abs: 1.6 10*3/uL (ref 0.7–4.0)
MCH: 29.4 pg (ref 26.0–34.0)
MCHC: 33.4 g/dL (ref 30.0–36.0)
MCV: 87.8 fL (ref 80.0–100.0)
Monocytes Absolute: 1.3 10*3/uL — ABNORMAL HIGH (ref 0.1–1.0)
Monocytes Relative: 5 %
Neutro Abs: 19.2 10*3/uL — ABNORMAL HIGH (ref 1.7–7.7)
Neutrophils Relative %: 80 %
Platelet Count: 304 10*3/uL (ref 150–400)
RBC: 4.19 MIL/uL (ref 3.87–5.11)
RDW: 13 % (ref 11.5–15.5)
WBC Count: 23.9 10*3/uL — ABNORMAL HIGH (ref 4.0–10.5)
nRBC: 0 % (ref 0.0–0.2)

## 2018-06-25 LAB — PREGNANCY, URINE: Preg Test, Ur: NEGATIVE

## 2018-06-25 MED ORDER — SODIUM CHLORIDE 0.9 % IV SOLN
Freq: Once | INTRAVENOUS | Status: AC
  Administered 2018-06-25: 12:00:00 via INTRAVENOUS
  Filled 2018-06-25: qty 250

## 2018-06-25 MED ORDER — GOSERELIN ACETATE 3.6 MG ~~LOC~~ IMPL
DRUG_IMPLANT | SUBCUTANEOUS | Status: AC
  Filled 2018-06-25: qty 3.6

## 2018-06-25 MED ORDER — PALONOSETRON HCL INJECTION 0.25 MG/5ML
INTRAVENOUS | Status: AC
  Filled 2018-06-25: qty 5

## 2018-06-25 MED ORDER — HEPARIN SOD (PORK) LOCK FLUSH 100 UNIT/ML IV SOLN
500.0000 [IU] | Freq: Once | INTRAVENOUS | Status: AC | PRN
  Administered 2018-06-25: 500 [IU]
  Filled 2018-06-25: qty 5

## 2018-06-25 MED ORDER — GOSERELIN ACETATE 3.6 MG ~~LOC~~ IMPL
3.6000 mg | DRUG_IMPLANT | Freq: Once | SUBCUTANEOUS | Status: AC
  Administered 2018-06-25: 3.6 mg via SUBCUTANEOUS

## 2018-06-25 MED ORDER — PEGFILGRASTIM 6 MG/0.6ML ~~LOC~~ PSKT
6.0000 mg | PREFILLED_SYRINGE | Freq: Once | SUBCUTANEOUS | Status: AC
  Administered 2018-06-25: 6 mg via SUBCUTANEOUS

## 2018-06-25 MED ORDER — PALONOSETRON HCL INJECTION 0.25 MG/5ML
0.2500 mg | Freq: Once | INTRAVENOUS | Status: AC
  Administered 2018-06-25: 0.25 mg via INTRAVENOUS

## 2018-06-25 MED ORDER — SODIUM CHLORIDE 0.9% FLUSH
10.0000 mL | INTRAVENOUS | Status: DC | PRN
  Administered 2018-06-25: 10 mL
  Filled 2018-06-25: qty 10

## 2018-06-25 MED ORDER — PEGFILGRASTIM 6 MG/0.6ML ~~LOC~~ PSKT
PREFILLED_SYRINGE | SUBCUTANEOUS | Status: AC
  Filled 2018-06-25: qty 0.6

## 2018-06-25 MED ORDER — SODIUM CHLORIDE 0.9 % IV SOLN
600.0000 mg/m2 | Freq: Once | INTRAVENOUS | Status: AC
  Administered 2018-06-25: 1280 mg via INTRAVENOUS
  Filled 2018-06-25: qty 64

## 2018-06-25 MED ORDER — DOXORUBICIN HCL CHEMO IV INJECTION 2 MG/ML
60.0000 mg/m2 | Freq: Once | INTRAVENOUS | Status: AC
  Administered 2018-06-25: 128 mg via INTRAVENOUS
  Filled 2018-06-25: qty 64

## 2018-06-25 MED ORDER — MUPIROCIN 2 % EX OINT: TOPICAL_OINTMENT | CUTANEOUS | 0 refills | Status: DC

## 2018-06-25 MED ORDER — SODIUM CHLORIDE 0.9 % IV SOLN
Freq: Once | INTRAVENOUS | Status: AC
  Administered 2018-06-25: 12:00:00 via INTRAVENOUS
  Filled 2018-06-25: qty 5

## 2018-06-25 NOTE — Patient Instructions (Signed)

## 2018-06-25 NOTE — Patient Instructions (Signed)
[facility] Discharge Instructions for Patients Receiving Chemotherapy  Today you received the following chemotherapy agents Doxorubicin and Cytoxan  To help prevent nausea and vomiting after your treatment, we encourage you to take your nausea medication as prescribed by MD.   If you develop nausea and vomiting that is not controlled by your nausea medication, call the clinic.   BELOW ARE SYMPTOMS THAT SHOULD BE REPORTED IMMEDIATELY:  *FEVER GREATER THAN 100.5 F  *CHILLS WITH OR WITHOUT FEVER  NAUSEA AND VOMITING THAT IS NOT CONTROLLED WITH YOUR NAUSEA MEDICATION  *UNUSUAL SHORTNESS OF BREATH  *UNUSUAL BRUISING OR BLEEDING  TENDERNESS IN MOUTH AND THROAT WITH OR WITHOUT PRESENCE OF ULCERS  *URINARY PROBLEMS  *BOWEL PROBLEMS  UNUSUAL RASH Items with * indicate a potential emergency and should be followed up as soon as possible.  Feel free to call the clinic should you have any questions or concerns. The clinic phone number is [phone].  Please show the [place] at check-in to the Emergency Department and triage nurse.

## 2018-06-25 NOTE — Assessment & Plan Note (Signed)
05/13/2018:Evaluation of 5 months of thickening of the left breast upper outer quadrant. 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 Several lymph nodes were identified by ultrasound. T2 N1 stage IIb clinical stage  Treatment plan: 1.Neoadjuvant chemotherapy with dose dense Adriamycin and Cytoxan followed by Taxol weekly x12 2.Breast conserving surgery with targeted node dissection if possible 3.Adjuvant radiation therapy 4.Followed by adjuvant antiestrogen therapy ---------------------------------------------------------------------- Current treatment: Cycle 3 dose dense Adriamycin and Cytoxan Echocardiogram 05/23/2018: EF 60 to 65% Labs reviewed  Chemo toxicities: Denies any nausea or vomiting. Mild fatigue Mild anemia  Return to clinic in 2 weeks for cycle 4

## 2018-06-25 NOTE — Progress Notes (Signed)
The patient was seen in the flush room today.  She reports having been scratched by her 37-year-old child this past weekend immediately above her right chest port.  She reports of the area had a small amount of purulent exudate.  She is concerned that it could become infected.  The right chest port was examined.  There was mild erythema along the surgical incision immediately superior to the port with a small area of purulent material versus granulation tissue.  The tissue covering the port was intact however.  An okay was given to access the port.  The patient was given a prescription of Bactroban ointment to use 3 times daily until the area had healed.  Sandi Mealy, MHS, PA-C Physician Assistant

## 2018-06-26 ENCOUNTER — Telehealth: Payer: Self-pay | Admitting: Hematology and Oncology

## 2018-06-26 NOTE — Telephone Encounter (Signed)
Tried to reach regarding schedule °

## 2018-07-01 ENCOUNTER — Telehealth: Payer: Self-pay | Admitting: *Deleted

## 2018-07-01 ENCOUNTER — Telehealth: Payer: Self-pay | Admitting: Hematology and Oncology

## 2018-07-01 NOTE — Telephone Encounter (Signed)
Called patient per 4/27 sch message - pt unable to come in today , scheduled pt on Wednesday per patient request . Pt is aware of appt date and time

## 2018-07-01 NOTE — Telephone Encounter (Signed)
Pt sent msg regarding port. Relate child bumped into it and irritated it. Been using cream Lucianne Lei prescribed and requested Lucianne Lei assess it to ensure she would be able to move forward with tx on 5/5. Lucianne Lei informed. Scheduling msg sent to an appt. Msg sent to pt regarding appt call for 4/27.

## 2018-07-03 ENCOUNTER — Other Ambulatory Visit: Payer: Self-pay

## 2018-07-03 ENCOUNTER — Inpatient Hospital Stay (HOSPITAL_BASED_OUTPATIENT_CLINIC_OR_DEPARTMENT_OTHER): Payer: 59 | Admitting: Medical

## 2018-07-03 VITALS — BP 120/86 | HR 101 | Temp 98.1°F | Resp 18 | Ht 68.0 in | Wt 202.0 lb

## 2018-07-03 DIAGNOSIS — S21111D Laceration without foreign body of right front wall of thorax without penetration into thoracic cavity, subsequent encounter: Secondary | ICD-10-CM

## 2018-07-03 NOTE — Progress Notes (Signed)
   The patient was seen today for follow-up of an abrasion that was on her right chest wall after being scratched by her child last week.  She has continued using Bactroban ointment on the area.  She also has been using hydrogen peroxide daily.  The area appears to be healing with one area of granulation tissue that measures approximately 3 x 3 mm.  There is no purulent drainage or increased warmth.  The area of erythema that is superior and lateral to the port is where the patient had had the area covered with a Band-Aid.  She was told to stop using peroxide to the area and let the area dry so that it would develop granulation tissue.  She was told that she could continue Bactroban.  She will return next week for her next treatment.  Sandi Mealy, MHS, PA-C Physician Assistant

## 2018-07-03 NOTE — Patient Instructions (Signed)

## 2018-07-04 NOTE — Assessment & Plan Note (Signed)
05/13/2018:Evaluation of 5 months of thickening of the left breast upper outer quadrant. 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 Several lymph nodes were identified by ultrasound. T2 N1 stage IIb clinical stage  Treatment plan: 1.Neoadjuvant chemotherapy with dose dense Adriamycin and Cytoxan followed by Taxol weekly x12 2.Breast conserving surgery with targeted node dissection if possible 3.Adjuvant radiation therapy 4.Followed by adjuvant antiestrogen therapy ---------------------------------------------------------------------- Current treatment: Cycle 4 dose dense Adriamycin and Cytoxan Echocardiogram 05/23/2018: EF 60 to 65% Labs reviewed  Chemo toxicities: Denies any nausea or vomiting. Complete loss of hair Mild fatigue Mild anemia  Skin irritation: Related to scratch, patient using bacitracin ointment. Return to clinic in2weeksfor cycle 1 Taxol

## 2018-07-08 NOTE — Progress Notes (Signed)
Patient Care Team: [name], MD as PCP - General (Family Medicine)  DIAGNOSIS:    ICD-10-CM   1. Malignant neoplasm of upper-outer quadrant of left breast in female, estrogen receptor positive ([place]) C50.412 LORazepam (ATIVAN) 0.5 MG tablet   Z17.0     SUMMARY OF ONCOLOGIC HISTORY:   Malignant neoplasm of upper-outer quadrant of left breast in female, estrogen receptor positive ([place])   05/13/2018 Initial Diagnosis    Evaluation of 5 months of thickening of the left breast upper outer quadrant.  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  Several lymph nodes were identified by ultrasound.  T2 N1 stage IIb clinical stage    05/16/2018 Cancer Staging    Staging form: Breast, AJCC 8th Edition - Clinical stage from 05/16/2018: Stage IIB (cT2, cN1, cM0, G3, ER+, PR+, HER2-) - Signed by [name], MD on 05/16/2018    05/28/2018 -  Neo-Adjuvant Chemotherapy    Neoadjuvant chemotherapy with dose dense Adriamycin and Cytoxan followed by Taxol weekly x12     CHIEF COMPLIANT: Cycle 4 Adriamycin and Cytoxan  INTERVAL HISTORY: Bethany Hanna is a 38 y.o. with above-mentioned history of left breast cancercurrently on neoadjuvant chemotherapy with dose dense Adriamycin and Cytoxan. Before her last treatment her child scratched her port area and it was irritated. She was given Bactroban ointment to apply to the area. She presents to the clinic alone todayforcycle 4.  She tolerated the last cycle of chemo extremely well.  She had profound fatigue that lasted for 24 hours.  She has anxiety issues which are well controlled with Xanax and Ativan.  REVIEW OF SYSTEMS:   Constitutional: Denies fevers, chills or abnormal weight loss Eyes: Denies  blurriness of vision Ears, nose, mouth, throat, and face: Denies mucositis or sore throat Respiratory: Denies cough, dyspnea or wheezes Cardiovascular: Denies palpitation, chest discomfort Gastrointestinal: Denies nausea, heartburn or change in bowel habits Skin: Denies abnormal skin rashes Lymphatics: Denies new lymphadenopathy or easy bruising Neurological: Denies numbness, tingling or new weaknesses Behavioral/Psych: Mood is stable, no new changes  Extremities: No lower extremity edema Breast: denies any pain or lumps or nodules in either breasts All other systems were reviewed with the patient and are negative.  I have reviewed the past medical history, past surgical history, social history and family history with the patient and they are unchanged from previous note.  ALLERGIES:  has No Known Allergies.  MEDICATIONS:  Current Outpatient Medications  Medication Sig Dispense Refill   acetaminophen (TYLENOL) 500 MG tablet Take 1,000 mg by mouth every 6 (six) hours as needed for moderate pain.      ALPRAZolam (XANAX) 0.5 MG tablet Take 1 tablet (0.5 mg total) by mouth at bedtime as needed for anxiety. 60 tablet 3   dexamethasone (DECADRON) 4 MG tablet Take 1 tablet day after chemo and 1 tablet 2 days after chemo with food 8 tablet 0   folic acid (FOLVITE) 1 MG tablet Take 1 mg by mouth daily.     lidocaine-prilocaine (EMLA) cream Apply to affected area once 30 g 3   LORazepam (ATIVAN) 0.5 MG tablet Take 1 tablet (0.5 mg total) by mouth at bedtime as needed (Nausea or vomiting). 30 tablet 1   mupirocin ointment (BACTROBAN) 2 % Apply to area  3 times daily until healed 30 g 0   ondansetron (ZOFRAN) 8 MG tablet Take 1 tablet (8 mg total) by mouth 2 (two) times daily as needed. Start on the third day after chemotherapy. 30 tablet 1   prochlorperazine (COMPAZINE) 10 MG tablet Take 1 tablet (10 mg total) by mouth every 6 (six) hours as needed (Nausea or vomiting). 30 tablet 1    traMADol (ULTRAM) 50 MG tablet Take 50 mg by mouth every 6 (six) hours as needed for moderate pain.     No current facility-administered medications for this visit.     PHYSICAL EXAMINATION: ECOG PERFORMANCE STATUS: 1 - Symptomatic but completely ambulatory  Vitals:   07/09/18 1005  BP: (!) 147/89  Pulse: (!) 115  Resp: 17  Temp: 97.7 F (36.5 C)  SpO2: 100%   Filed Weights   07/09/18 1005  Weight: 204 lb 8 oz (92.8 kg)    GENERAL: alert, no distress and comfortable SKIN: skin color, texture, turgor are normal, no rashes or significant lesions EYES: normal, Conjunctiva are pink and non-injected, sclera clear OROPHARYNX: no exudate, no erythema and lips, buccal mucosa, and tongue normal  NECK: supple, thyroid normal size, non-tender, without nodularity LYMPH: no palpable lymphadenopathy in the cervical, axillary or inguinal LUNGS: clear to auscultation and percussion with normal breathing effort HEART: regular rate & rhythm and no murmurs and no lower extremity edema ABDOMEN: abdomen soft, non-tender and normal bowel sounds MUSCULOSKELETAL: no cyanosis of digits and no clubbing  NEURO: alert & oriented x 3 with fluent speech, no focal motor/sensory deficits EXTREMITIES: No lower extremity edema  LABORATORY DATA:  I have reviewed the data as listed CMP Latest Ref Rng & Units 06/25/2018 06/11/2018 06/04/2018  Glucose 70 - 99 mg/dL 99 97 106(H)  BUN 6 - 20 mg/dL _0 Creatinine 0.44 - 1.00 mg/dL 0.69 0.72 0.71  Sodium 135 - 145 mmol/L 136 139 137  Potassium 3.5 - 5.1 mmol/L 4.2 4.0 4.2  Chloride 98 - 111 mmol/L 107 105 102  CO2 22 - 32 mmol/L 20(L) 24 25  Calcium 8.9 - 10.3 mg/dL 8.9 9.5 9.2  Total Protein 6.5 - 8.1 g/dL 7.4 7.7 7.4  Total Bilirubin 0.3 - 1.2 mg/dL <0.2(L) <0.2(L) 0.2(L)  Alkaline Phos 38 - 126 U/L 101 109 115  AST 15 - 41 U/L 14(L) 13(L) 24  ALT 0 - 44 U/L _1 Lab Results  Component Value Date   WBC 20.9 (H) 07/09/2018   HGB 11.4 (L)  07/09/2018   HCT 35.1 (L) 07/09/2018   MCV 89.1 07/09/2018   PLT 313 07/09/2018   NEUTROABS 14.5 (H) 07/09/2018    ASSESSMENT & PLAN:  Malignant neoplasm of upper-outer quadrant of left breast in female, estrogen receptor positive ([facility]) 05/13/2018:Evaluation of 5 months of thickening of the left breast upper outer quadrant. 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 Several lymph nodes were identified by ultrasound. T2 N1 stage IIb clinical stage  Treatment plan: 1.Neoadjuvant chemotherapy with dose dense Adriamycin and Cytoxan followed by Taxol weekly x12 2.Breast conserving surgery with targeted node dissection if possible 3.Adjuvant radiation therapy 4.Followed by adjuvant antiestrogen therapy ---------------------------------------------------------------------- Current treatment: Cycle 4 dose dense Adriamycin and Cytoxan Echocardiogram 05/23/2018: EF 60 to 65% Labs  reviewed  Chemo toxicities: Denies any nausea or vomiting. Complete loss of hair Mild fatigue Mild anemia  Patient has not had genetic testing yet we will schedule her for genetics. Return to clinic in2weeksfor cycle 1 Taxol  No orders of the defined types were placed in this encounter.  The patient has a good understanding of the overall plan. she agrees with it. she will call with any problems that may develop before the next visit here.  [name], MD 07/09/2018  Julious Oka Dorshimer am acting as scribe for Dr. [name].  I have reviewed the above documentation for accuracy and completeness, and I agree with the above.

## 2018-07-09 ENCOUNTER — Inpatient Hospital Stay (HOSPITAL_BASED_OUTPATIENT_CLINIC_OR_DEPARTMENT_OTHER): Payer: 59 | Admitting: Hematology and Oncology

## 2018-07-09 ENCOUNTER — Encounter: Payer: Self-pay | Admitting: *Deleted

## 2018-07-09 ENCOUNTER — Inpatient Hospital Stay: Payer: 59

## 2018-07-09 ENCOUNTER — Inpatient Hospital Stay: Payer: 59 | Attending: Hematology and Oncology

## 2018-07-09 ENCOUNTER — Other Ambulatory Visit: Payer: Self-pay

## 2018-07-09 VITALS — HR 79

## 2018-07-09 DIAGNOSIS — D649 Anemia, unspecified: Secondary | ICD-10-CM | POA: Diagnosis not present

## 2018-07-09 DIAGNOSIS — Z5111 Encounter for antineoplastic chemotherapy: Secondary | ICD-10-CM | POA: Insufficient documentation

## 2018-07-09 DIAGNOSIS — Z79899 Other long term (current) drug therapy: Secondary | ICD-10-CM | POA: Insufficient documentation

## 2018-07-09 DIAGNOSIS — R5383 Other fatigue: Secondary | ICD-10-CM | POA: Diagnosis not present

## 2018-07-09 DIAGNOSIS — Z5189 Encounter for other specified aftercare: Secondary | ICD-10-CM | POA: Insufficient documentation

## 2018-07-09 DIAGNOSIS — L658 Other specified nonscarring hair loss: Secondary | ICD-10-CM | POA: Diagnosis not present

## 2018-07-09 DIAGNOSIS — C50412 Malignant neoplasm of upper-outer quadrant of left female breast: Secondary | ICD-10-CM

## 2018-07-09 DIAGNOSIS — F419 Anxiety disorder, unspecified: Secondary | ICD-10-CM | POA: Insufficient documentation

## 2018-07-09 DIAGNOSIS — R110 Nausea: Secondary | ICD-10-CM | POA: Insufficient documentation

## 2018-07-09 DIAGNOSIS — Z170 Estrogen receptor positive status [ER+]: Secondary | ICD-10-CM | POA: Insufficient documentation

## 2018-07-09 DIAGNOSIS — C773 Secondary and unspecified malignant neoplasm of axilla and upper limb lymph nodes: Secondary | ICD-10-CM

## 2018-07-09 DIAGNOSIS — Z95828 Presence of other vascular implants and grafts: Secondary | ICD-10-CM

## 2018-07-09 LAB — CBC WITH DIFFERENTIAL (CANCER CENTER ONLY)
Abs Immature Granulocytes: 3.48 10*3/uL — ABNORMAL HIGH (ref 0.00–0.07)
Basophils Absolute: 0.1 10*3/uL (ref 0.0–0.1)
Basophils Relative: 0 %
Eosinophils Absolute: 0 10*3/uL (ref 0.0–0.5)
Eosinophils Relative: 0 %
HCT: 35.1 % — ABNORMAL LOW (ref 36.0–46.0)
Hemoglobin: 11.4 g/dL — ABNORMAL LOW (ref 12.0–15.0)
Immature Granulocytes: 17 %
Lymphocytes Relative: 6 %
Lymphs Abs: 1.3 10*3/uL (ref 0.7–4.0)
MCH: 28.9 pg (ref 26.0–34.0)
MCHC: 32.5 g/dL (ref 30.0–36.0)
MCV: 89.1 fL (ref 80.0–100.0)
Monocytes Absolute: 1.5 10*3/uL — ABNORMAL HIGH (ref 0.1–1.0)
Monocytes Relative: 7 %
Neutro Abs: 14.5 10*3/uL — ABNORMAL HIGH (ref 1.7–7.7)
Neutrophils Relative %: 70 %
Platelet Count: 313 10*3/uL (ref 150–400)
RBC: 3.94 MIL/uL (ref 3.87–5.11)
RDW: 14.2 % (ref 11.5–15.5)
WBC Count: 20.9 10*3/uL — ABNORMAL HIGH (ref 4.0–10.5)
nRBC: 0.1 % (ref 0.0–0.2)

## 2018-07-09 LAB — CMP (CANCER CENTER ONLY)
ALT: 10 U/L (ref 0–44)
AST: 11 U/L — ABNORMAL LOW (ref 15–41)
Albumin: 4.2 g/dL (ref 3.5–5.0)
Alkaline Phosphatase: 120 U/L (ref 38–126)
Anion gap: 10 (ref 5–15)
BUN: 9 mg/dL (ref 6–20)
CO2: 25 mmol/L (ref 22–32)
Calcium: 9.1 mg/dL (ref 8.9–10.3)
Chloride: 105 mmol/L (ref 98–111)
Creatinine: 0.7 mg/dL (ref 0.44–1.00)
GFR, Est AFR Am: >60 mL/min (ref >60)
GFR, Estimated: >60 mL/min (ref >60)
Glucose, Bld: 99 mg/dL (ref 70–99)
Potassium: 4.1 mmol/L (ref 3.5–5.1)
Sodium: 140 mmol/L (ref 135–145)
Total Bilirubin: 0.2 mg/dL — ABNORMAL LOW (ref 0.3–1.2)
Total Protein: 7.4 g/dL (ref 6.5–8.1)

## 2018-07-09 LAB — PREGNANCY, URINE: Preg Test, Ur: NEGATIVE

## 2018-07-09 MED ORDER — PALONOSETRON HCL INJECTION 0.25 MG/5ML
INTRAVENOUS | Status: AC
  Filled 2018-07-09: qty 5

## 2018-07-09 MED ORDER — PEGFILGRASTIM 6 MG/0.6ML ~~LOC~~ PSKT
6.0000 mg | PREFILLED_SYRINGE | Freq: Once | SUBCUTANEOUS | Status: AC
  Administered 2018-07-09: 6 mg via SUBCUTANEOUS

## 2018-07-09 MED ORDER — HEPARIN SOD (PORK) LOCK FLUSH 100 UNIT/ML IV SOLN
500.0000 [IU] | Freq: Once | INTRAVENOUS | Status: AC | PRN
  Administered 2018-07-09: 500 [IU]
  Filled 2018-07-09: qty 5

## 2018-07-09 MED ORDER — SODIUM CHLORIDE 0.9% FLUSH
10.0000 mL | INTRAVENOUS | Status: DC | PRN
  Administered 2018-07-09: 10 mL
  Filled 2018-07-09: qty 10

## 2018-07-09 MED ORDER — SODIUM CHLORIDE 0.9 % IV SOLN
Freq: Once | INTRAVENOUS | Status: AC
  Administered 2018-07-09: 11:00:00 via INTRAVENOUS
  Filled 2018-07-09: qty 250

## 2018-07-09 MED ORDER — ALPRAZOLAM 0.5 MG PO TABS: 0.5000 mg | ORAL_TABLET | Freq: Every evening | ORAL | 3 refills | Status: DC | PRN

## 2018-07-09 MED ORDER — PEGFILGRASTIM 6 MG/0.6ML ~~LOC~~ PSKT
PREFILLED_SYRINGE | SUBCUTANEOUS | Status: AC
  Filled 2018-07-09: qty 0.6

## 2018-07-09 MED ORDER — LORAZEPAM 0.5 MG PO TABS: 0.5000 mg | ORAL_TABLET | Freq: Every evening | ORAL | 1 refills | Status: DC | PRN

## 2018-07-09 MED ORDER — SODIUM CHLORIDE 0.9 % IV SOLN
600.0000 mg/m2 | Freq: Once | INTRAVENOUS | Status: AC
  Administered 2018-07-09: 1280 mg via INTRAVENOUS
  Filled 2018-07-09: qty 64

## 2018-07-09 MED ORDER — PALONOSETRON HCL INJECTION 0.25 MG/5ML
0.2500 mg | Freq: Once | INTRAVENOUS | Status: AC
  Administered 2018-07-09: 0.25 mg via INTRAVENOUS

## 2018-07-09 MED ORDER — SODIUM CHLORIDE 0.9 % IV SOLN
Freq: Once | INTRAVENOUS | Status: AC
  Administered 2018-07-09: 11:00:00 via INTRAVENOUS
  Filled 2018-07-09: qty 5

## 2018-07-09 MED ORDER — DOXORUBICIN HCL CHEMO IV INJECTION 2 MG/ML
60.0000 mg/m2 | Freq: Once | INTRAVENOUS | Status: AC
  Administered 2018-07-09: 128 mg via INTRAVENOUS
  Filled 2018-07-09: qty 64

## 2018-07-09 NOTE — Patient Instructions (Signed)
[facility] Discharge Instructions for Patients Receiving Chemotherapy  Today you received the following chemotherapy agents Doxorubicin and Cytoxan  To help prevent nausea and vomiting after your treatment, we encourage you to take your nausea medication as prescribed by MD.   If you develop nausea and vomiting that is not controlled by your nausea medication, call the clinic.   BELOW ARE SYMPTOMS THAT SHOULD BE REPORTED IMMEDIATELY:  *FEVER GREATER THAN 100.5 F  *CHILLS WITH OR WITHOUT FEVER  NAUSEA AND VOMITING THAT IS NOT CONTROLLED WITH YOUR NAUSEA MEDICATION  *UNUSUAL SHORTNESS OF BREATH  *UNUSUAL BRUISING OR BLEEDING  TENDERNESS IN MOUTH AND THROAT WITH OR WITHOUT PRESENCE OF ULCERS  *URINARY PROBLEMS  *BOWEL PROBLEMS  UNUSUAL RASH Items with * indicate a potential emergency and should be followed up as soon as possible.  Feel free to call the clinic should you have any questions or concerns. The clinic phone number is [phone].  Please show the [facility] at check-in to the Emergency Department and triage nurse.

## 2018-07-17 NOTE — Assessment & Plan Note (Signed)
05/13/2018:Evaluation of 5 months of thickening of the left breast upper outer quadrant. 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 Several lymph nodes were identified by ultrasound. T2 N1 stage IIb clinical stage  Treatment plan: 1.Neoadjuvant chemotherapy with dose dense Adriamycin and Cytoxan followed by Taxol weekly x12 2.Breast conserving surgery with targeted node dissection if possible 3.Adjuvant radiation therapy 4.Followed by adjuvant antiestrogen therapy ---------------------------------------------------------------------- Current treatment: Completed 4 cycles of dose dense Adriamycin and Cytoxan, today cycle 1 of Taxol Echocardiogram 05/23/2018: EF 60 to 65% Labs reviewed  Chemo toxicities: Denies any nausea or vomiting. Complete loss of hair Mild fatigue Mild anemia  Patient has not had genetic testing yet we will schedule her for genetics. Return to clinic in1 week for cycle 2 of Taxol

## 2018-07-19 ENCOUNTER — Telehealth: Payer: Self-pay | Admitting: Hematology and Oncology

## 2018-07-19 NOTE — Telephone Encounter (Signed)
Called patient regarding upcoming [facility] appointment, left a voicemail and an e-mail has been sent.

## 2018-07-22 NOTE — Progress Notes (Signed)
Patient Care Team: [name], MD as PCP - General (Family Medicine)  DIAGNOSIS:    ICD-10-CM   1. Malignant neoplasm of upper-outer quadrant of left breast in female, estrogen receptor positive ([place]) C50.412    Z17.0     SUMMARY OF ONCOLOGIC HISTORY:   Malignant neoplasm of upper-outer quadrant of left breast in female, estrogen receptor positive ([place])   05/13/2018 Initial Diagnosis    Evaluation of 5 months of thickening of the left breast upper outer quadrant.  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  Several lymph nodes were identified by ultrasound.  T2 N1 stage IIb clinical stage    05/16/2018 Cancer Staging    Staging form: Breast, AJCC 8th Edition - Clinical stage from 05/16/2018: Stage IIB (cT2, cN1, cM0, G3, ER+, PR+, HER2-) - Signed by [name], MD on 05/16/2018    05/28/2018 -  Neo-Adjuvant Chemotherapy    Neoadjuvant chemotherapy with dose dense Adriamycin and Cytoxan followed by Taxol weekly x12     CHIEF COMPLIANT: Cycle 1 Taxol  INTERVAL HISTORY: Bethany Hanna is a 38 y.o. with above-mentioned history of left breast who completed 4 cycles of neoadjuvant chemotherapy with dose dense Adriamycin and Cytoxan and will begin weekly Taxol treatments today. She presents to the clinic today for cycle 1.  She has tolerated the fourth cycle of dose dense Adriamycin and Cytoxan extremely well without any toxicities.  She is slightly nervous about receiving Taxol today.  REVIEW OF SYSTEMS:   Constitutional: Denies fevers, chills or abnormal weight loss Eyes: Denies blurriness of vision Ears, nose, mouth, throat, and face: Denies mucositis or sore throat Respiratory: Denies cough, dyspnea or wheezes Cardiovascular:  Denies palpitation, chest discomfort Gastrointestinal: Denies nausea, heartburn or change in bowel habits Skin: Denies abnormal skin rashes Lymphatics: Denies new lymphadenopathy or easy bruising Neurological: Denies numbness, tingling or new weaknesses Behavioral/Psych: Mood is stable, no new changes  Extremities: No lower extremity edema Breast: denies any pain or lumps or nodules in either breasts All other systems were reviewed with the patient and are negative.  I have reviewed the past medical history, past surgical history, social history and family history with the patient and they are unchanged from previous note.  ALLERGIES:  has No Known Allergies.  MEDICATIONS:  Current Outpatient Medications  Medication Sig Dispense Refill  . acetaminophen (TYLENOL) 500 MG tablet Take 1,000 mg by mouth every 6 (six) hours as needed for moderate pain.     Marland Kitchen ALPRAZolam (XANAX) 0.5 MG tablet Take 1 tablet (0.5 mg total) by mouth at bedtime as needed for anxiety. 60 tablet 3  . dexamethasone (DECADRON) 4 MG tablet Take 1 tablet day after chemo and 1 tablet 2 days after chemo with food 8 tablet 0  . folic acid (FOLVITE) 1 MG tablet Take 1 mg by mouth daily.    Marland Kitchen lidocaine-prilocaine (EMLA) cream Apply to affected area once 30 g 3  . LORazepam (ATIVAN) 0.5 MG tablet Take 1 tablet (0.5 mg total) by mouth at bedtime as needed (Nausea or vomiting). 30 tablet 1  . mupirocin ointment (BACTROBAN) 2 % Apply to area 3 times daily until healed 30 g 0  . ondansetron (ZOFRAN) 8 MG tablet Take 1 tablet (8 mg total) by mouth 2 (two)  times daily as needed. Start on the third day after chemotherapy. 30 tablet 1  . prochlorperazine (COMPAZINE) 10 MG tablet Take 1 tablet (10 mg total) by mouth every 6 (six) hours as needed (Nausea or vomiting). 30 tablet 1  . traMADol (ULTRAM) 50 MG tablet Take 50 mg by mouth every 6 (six) hours as needed for moderate pain.     No current facility-administered medications for this  visit.     PHYSICAL EXAMINATION: ECOG PERFORMANCE STATUS: 0 - Asymptomatic  Vitals:   07/23/18 1129  BP: (!) 144/92  Pulse: (!) 104  Resp: 18  Temp: 98.3 F (36.8 C)  SpO2: 100%   Filed Weights   07/23/18 1129  Weight: 205 lb 4.8 oz (93.1 kg)    GENERAL: alert, no distress and comfortable SKIN: skin color, texture, turgor are normal, no rashes or significant lesions EYES: normal, Conjunctiva are pink and non-injected, sclera clear OROPHARYNX: no exudate, no erythema and lips, buccal mucosa, and tongue normal  NECK: supple, thyroid normal size, non-tender, without nodularity LYMPH: no palpable lymphadenopathy in the cervical, axillary or inguinal LUNGS: clear to auscultation and percussion with normal breathing effort HEART: regular rate & rhythm and no murmurs and no lower extremity edema ABDOMEN: abdomen soft, non-tender and normal bowel sounds MUSCULOSKELETAL: no cyanosis of digits and no clubbing  NEURO: alert & oriented x 3 with fluent speech, no focal motor/sensory deficits EXTREMITIES: No lower extremity edema  LABORATORY DATA:  I have reviewed the data as listed CMP Latest Ref Rng & Units 07/09/2018 06/25/2018 06/11/2018  Glucose 70 - 99 mg/dL 99 99 97  BUN 6 - 20 mg/dL _0 Creatinine 0.44 - 1.00 mg/dL 0.70 0.69 0.72  Sodium 135 - 145 mmol/L 140 136 139  Potassium 3.5 - 5.1 mmol/L 4.1 4.2 4.0  Chloride 98 - 111 mmol/L 105 107 105  CO2 22 - 32 mmol/L 25 20(L) 24  Calcium 8.9 - 10.3 mg/dL 9.1 8.9 9.5  Total Protein 6.5 - 8.1 g/dL 7.4 7.4 7.7  Total Bilirubin 0.3 - 1.2 mg/dL 0.2(L) <0.2(L) <0.2(L)  Alkaline Phos 38 - 126 U/L 120 101 109  AST 15 - 41 U/L 11(L) 14(L) 13(L)  ALT 0 - 44 U/L _1 Lab Results  Component Value Date   WBC 14.9 (H) 07/23/2018   HGB 10.5 (L) 07/23/2018   HCT 31.8 (L) 07/23/2018   MCV 88.3 07/23/2018   PLT 261 07/23/2018   NEUTROABS PENDING 07/23/2018    ASSESSMENT & PLAN:  Malignant neoplasm of upper-outer quadrant of left  breast in female, estrogen receptor positive ([facility]) 05/13/2018:Evaluation of 5 months of thickening of the left breast upper outer quadrant. 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 Several lymph nodes were identified by ultrasound. T2 N1 stage IIb clinical stage  Treatment plan: 1.Neoadjuvant chemotherapy with dose dense Adriamycin and Cytoxan followed by Taxol weekly x12 2.Breast conserving surgery with targeted node dissection if possible 3.Adjuvant radiation therapy 4.Followed by adjuvant antiestrogen therapy ---------------------------------------------------------------------- Current treatment: Completed 4 cycles of dose dense Adriamycin and Cytoxan, today cycle 1 of Taxol Echocardiogram 05/23/2018: EF 60 to 65% Labs reviewed  Chemo toxicities: Denies any nausea or vomiting. Complete loss of hair Mild fatigue Mild anemia  Patient has not had  genetic testing yet we will schedule her for genetics. Return to clinic in1 week for cycle 2 of Taxol    No orders of the defined types were placed in this encounter.  The patient has a good understanding of the overall plan. she agrees with it. she will call with any problems that may develop before the next visit here.  [name], MD 07/23/2018  Julious Oka Dorshimer am acting as scribe for Dr. [name].  I have reviewed the above documentation for accuracy and completeness, and I agree with the above.

## 2018-07-23 ENCOUNTER — Inpatient Hospital Stay: Payer: 59

## 2018-07-23 ENCOUNTER — Other Ambulatory Visit: Payer: Self-pay

## 2018-07-23 ENCOUNTER — Inpatient Hospital Stay: Payer: 59 | Admitting: Genetic Counselor

## 2018-07-23 ENCOUNTER — Inpatient Hospital Stay (HOSPITAL_BASED_OUTPATIENT_CLINIC_OR_DEPARTMENT_OTHER): Payer: 59 | Admitting: Hematology and Oncology

## 2018-07-23 VITALS — BP 127/89 | HR 97 | Temp 98.7°F | Resp 18

## 2018-07-23 DIAGNOSIS — L658 Other specified nonscarring hair loss: Secondary | ICD-10-CM

## 2018-07-23 DIAGNOSIS — Z79899 Other long term (current) drug therapy: Secondary | ICD-10-CM

## 2018-07-23 DIAGNOSIS — F419 Anxiety disorder, unspecified: Secondary | ICD-10-CM

## 2018-07-23 DIAGNOSIS — Z5111 Encounter for antineoplastic chemotherapy: Secondary | ICD-10-CM | POA: Diagnosis not present

## 2018-07-23 DIAGNOSIS — Z170 Estrogen receptor positive status [ER+]: Secondary | ICD-10-CM

## 2018-07-23 DIAGNOSIS — Z95828 Presence of other vascular implants and grafts: Secondary | ICD-10-CM

## 2018-07-23 DIAGNOSIS — C50412 Malignant neoplasm of upper-outer quadrant of left female breast: Secondary | ICD-10-CM

## 2018-07-23 DIAGNOSIS — R5383 Other fatigue: Secondary | ICD-10-CM | POA: Diagnosis not present

## 2018-07-23 DIAGNOSIS — C773 Secondary and unspecified malignant neoplasm of axilla and upper limb lymph nodes: Secondary | ICD-10-CM | POA: Diagnosis not present

## 2018-07-23 DIAGNOSIS — D649 Anemia, unspecified: Secondary | ICD-10-CM

## 2018-07-23 LAB — CMP (CANCER CENTER ONLY)
ALT: 15 U/L (ref 0–44)
AST: 13 U/L — ABNORMAL LOW (ref 15–41)
Albumin: 4.1 g/dL (ref 3.5–5.0)
Alkaline Phosphatase: 114 U/L (ref 38–126)
Anion gap: 11 (ref 5–15)
BUN: 8 mg/dL (ref 6–20)
CO2: 24 mmol/L (ref 22–32)
Calcium: 9.5 mg/dL (ref 8.9–10.3)
Chloride: 100 mmol/L (ref 98–111)
Creatinine: 0.74 mg/dL (ref 0.44–1.00)
GFR, Est AFR Am: >60 mL/min (ref >60)
GFR, Estimated: >60 mL/min (ref >60)
Glucose, Bld: 94 mg/dL (ref 70–99)
Potassium: 3.8 mmol/L (ref 3.5–5.1)
Sodium: 135 mmol/L (ref 135–145)
Total Bilirubin: 0.3 mg/dL (ref 0.3–1.2)
Total Protein: 7.3 g/dL (ref 6.5–8.1)

## 2018-07-23 LAB — CBC WITH DIFFERENTIAL (CANCER CENTER ONLY)
Abs Immature Granulocytes: 1.07 10*3/uL — ABNORMAL HIGH (ref 0.00–0.07)
Basophils Absolute: 0.2 10*3/uL — ABNORMAL HIGH (ref 0.0–0.1)
Basophils Relative: 1 %
Eosinophils Absolute: 0 10*3/uL (ref 0.0–0.5)
Eosinophils Relative: 0 %
HCT: 31.8 % — ABNORMAL LOW (ref 36.0–46.0)
Hemoglobin: 10.5 g/dL — ABNORMAL LOW (ref 12.0–15.0)
Immature Granulocytes: 7 %
Lymphocytes Relative: 7 %
Lymphs Abs: 1 10*3/uL (ref 0.7–4.0)
MCH: 29.2 pg (ref 26.0–34.0)
MCHC: 33 g/dL (ref 30.0–36.0)
MCV: 88.3 fL (ref 80.0–100.0)
Monocytes Absolute: 1.2 10*3/uL — ABNORMAL HIGH (ref 0.1–1.0)
Monocytes Relative: 8 %
Neutro Abs: 11.5 10*3/uL — ABNORMAL HIGH (ref 1.7–7.7)
Neutrophils Relative %: 77 %
Platelet Count: 261 10*3/uL (ref 150–400)
RBC: 3.6 MIL/uL — ABNORMAL LOW (ref 3.87–5.11)
RDW: 15.2 % (ref 11.5–15.5)
WBC Count: 14.9 10*3/uL — ABNORMAL HIGH (ref 4.0–10.5)
nRBC: 0.1 % (ref 0.0–0.2)

## 2018-07-23 MED ORDER — DEXAMETHASONE SODIUM PHOSPHATE 10 MG/ML IJ SOLN
10.0000 mg | Freq: Once | INTRAMUSCULAR | Status: AC
  Administered 2018-07-23: 13:00:00 10 mg via INTRAVENOUS

## 2018-07-23 MED ORDER — HEPARIN SOD (PORK) LOCK FLUSH 100 UNIT/ML IV SOLN
500.0000 [IU] | Freq: Once | INTRAVENOUS | Status: AC
  Administered 2018-07-23: 15:00:00 500 [IU] via INTRAVENOUS
  Filled 2018-07-23: qty 5

## 2018-07-23 MED ORDER — DEXAMETHASONE SODIUM PHOSPHATE 10 MG/ML IJ SOLN
INTRAMUSCULAR | Status: AC
  Filled 2018-07-23: qty 1

## 2018-07-23 MED ORDER — SODIUM CHLORIDE 0.9% FLUSH
10.0000 mL | INTRAVENOUS | Status: DC | PRN
  Administered 2018-07-23: 10 mL
  Filled 2018-07-23: qty 10

## 2018-07-23 MED ORDER — SODIUM CHLORIDE 0.9 % IV SOLN: 10.0000 mg | Freq: Once | INTRAVENOUS | Status: DC

## 2018-07-23 MED ORDER — DIPHENHYDRAMINE HCL 50 MG/ML IJ SOLN
50.0000 mg | Freq: Once | INTRAMUSCULAR | Status: AC
  Administered 2018-07-23: 13:00:00 50 mg via INTRAVENOUS

## 2018-07-23 MED ORDER — SODIUM CHLORIDE 0.9% FLUSH
10.0000 mL | Freq: Once | INTRAVENOUS | Status: AC
  Administered 2018-07-23: 15:00:00 10 mL via INTRAVENOUS
  Filled 2018-07-23: qty 10

## 2018-07-23 MED ORDER — SODIUM CHLORIDE 0.9 % IV SOLN
Freq: Once | INTRAVENOUS | Status: AC
  Administered 2018-07-23: 13:00:00 via INTRAVENOUS
  Filled 2018-07-23: qty 250

## 2018-07-23 MED ORDER — SODIUM CHLORIDE 0.9 % IV SOLN
80.0000 mg/m2 | Freq: Once | INTRAVENOUS | Status: AC
  Administered 2018-07-23: 14:00:00 168 mg via INTRAVENOUS
  Filled 2018-07-23: qty 28

## 2018-07-23 MED ORDER — GOSERELIN ACETATE 3.6 MG ~~LOC~~ IMPL
DRUG_IMPLANT | SUBCUTANEOUS | Status: AC
  Filled 2018-07-23: qty 3.6

## 2018-07-23 MED ORDER — FAMOTIDINE IN NACL 20-0.9 MG/50ML-% IV SOLN
20.0000 mg | Freq: Once | INTRAVENOUS | Status: AC
  Administered 2018-07-23: 13:00:00 20 mg via INTRAVENOUS

## 2018-07-23 MED ORDER — FAMOTIDINE IN NACL 20-0.9 MG/50ML-% IV SOLN
INTRAVENOUS | Status: AC
  Filled 2018-07-23: qty 50

## 2018-07-23 MED ORDER — DIPHENHYDRAMINE HCL 50 MG/ML IJ SOLN
INTRAMUSCULAR | Status: AC
  Filled 2018-07-23: qty 1

## 2018-07-23 MED ORDER — GOSERELIN ACETATE 3.6 MG ~~LOC~~ IMPL
3.6000 mg | DRUG_IMPLANT | Freq: Once | SUBCUTANEOUS | Status: AC
  Administered 2018-07-23: 13:00:00 3.6 mg via SUBCUTANEOUS

## 2018-07-23 NOTE — Patient Instructions (Signed)
[facility] Discharge Instructions for Patients Receiving Chemotherapy  Today you received the following chemotherapy agents Taxol  To help prevent nausea and vomiting after your treatment, we encourage you to take your nausea medication as prescribed by MD.   If you develop nausea and vomiting that is not controlled by your nausea medication, call the clinic.   BELOW ARE SYMPTOMS THAT SHOULD BE REPORTED IMMEDIATELY:  *FEVER GREATER THAN 100.5 F  *CHILLS WITH OR WITHOUT FEVER  NAUSEA AND VOMITING THAT IS NOT CONTROLLED WITH YOUR NAUSEA MEDICATION  *UNUSUAL SHORTNESS OF BREATH  *UNUSUAL BRUISING OR BLEEDING  TENDERNESS IN MOUTH AND THROAT WITH OR WITHOUT PRESENCE OF ULCERS  *URINARY PROBLEMS  *BOWEL PROBLEMS  UNUSUAL RASH Items with * indicate a potential emergency and should be followed up as soon as possible.  Feel free to call the clinic should you have any questions or concerns. The clinic phone number is [phone].  Please show the [place] at check-in to the Emergency Department and triage nurse.  Paclitaxel injection(Taxol) What is this medicine? PACLITAXEL (PAK li TAX el) is a chemotherapy drug. It targets fast dividing cells, like cancer cells, and causes these cells to die. This medicine is used to treat ovarian cancer, breast cancer, lung cancer, Kaposi's sarcoma, and other cancers. This medicine may be used for other purposes; ask your health care provider or pharmacist if you have questions. COMMON BRAND NAME(S): Onxol, Taxol What should I tell my health care provider before I take this medicine? They need to know if you have any of these conditions: -history of irregular heartbeat -liver disease -low blood counts, like low white cell, platelet, or red cell counts -lung or breathing disease, like asthma -tingling of the fingers or toes, or other nerve disorder -an unusual or allergic reaction to paclitaxel, alcohol, polyoxyethylated  castor oil, other chemotherapy, other medicines, foods, dyes, or preservatives -pregnant or trying to get pregnant -breast-feeding How should I use this medicine? This drug is given as an infusion into a vein. It is administered in a hospital or clinic by a specially trained health care professional. Talk to your pediatrician regarding the use of this medicine in children. Special care may be needed. Overdosage: If you think you have taken too much of this medicine contact a poison control center or emergency room at once. NOTE: This medicine is only for you. Do not share this medicine with others. What if I miss a dose? It is important not to miss your dose. Call your doctor or health care professional if you are unable to keep an appointment. What may interact with this medicine? Do not take this medicine with any of the following medications: -disulfiram -metronidazole This medicine may also interact with the following medications: -antiviral medicines for hepatitis, HIV or AIDS -certain antibiotics like erythromycin and clarithromycin -certain medicines for fungal infections like ketoconazole and itraconazole -certain medicines for seizures like carbamazepine, phenobarbital, phenytoin -gemfibrozil -nefazodone -rifampin -St. John's wort This list may not describe all possible interactions. Give your health care provider a list of all the medicines, herbs, non-prescription drugs, or dietary supplements you use. Also tell them if you smoke, drink alcohol, or use illegal drugs. Some items may interact with your medicine. What should I watch for while using this medicine? Your condition will be monitored carefully while you are receiving this medicine. You will need important blood work done while you are taking this medicine. This medicine can cause serious allergic reactions. To reduce  your risk you will need to take other medicine(s) before treatment with this medicine. If you experience  allergic reactions like skin rash, itching or hives, swelling of the face, lips, or tongue, tell your doctor or health care professional right away. In some cases, you may be given additional medicines to help with side effects. Follow all directions for their use. This drug may make you feel generally unwell. This is not uncommon, as chemotherapy can affect healthy cells as well as cancer cells. Report any side effects. Continue your course of treatment even though you feel ill unless your doctor tells you to stop. Call your doctor or health care professional for advice if you get a fever, chills or sore throat, or other symptoms of a cold or flu. Do not treat yourself. This drug decreases your body's ability to fight infections. Try to avoid being around people who are sick. This medicine may increase your risk to bruise or bleed. Call your doctor or health care professional if you notice any unusual bleeding. Be careful brushing and flossing your teeth or using a toothpick because you may get an infection or bleed more easily. If you have any dental work done, tell your dentist you are receiving this medicine. Avoid taking products that contain aspirin, acetaminophen, ibuprofen, naproxen, or ketoprofen unless instructed by your doctor. These medicines may hide a fever. Do not become pregnant while taking this medicine. Women should inform their doctor if they wish to become pregnant or think they might be pregnant. There is a potential for serious side effects to an unborn child. Talk to your health care professional or pharmacist for more information. Do not breast-feed an infant while taking this medicine. Men are advised not to father a child while receiving this medicine. This product may contain alcohol. Ask your pharmacist or healthcare provider if this medicine contains alcohol. Be sure to tell all healthcare providers you are taking this medicine. Certain medicines, like metronidazole and  disulfiram, can cause an unpleasant reaction when taken with alcohol. The reaction includes flushing, headache, nausea, vomiting, sweating, and increased thirst. The reaction can last from 30 minutes to several hours. What side effects may I notice from receiving this medicine? Side effects that you should report to your doctor or health care professional as soon as possible: -allergic reactions like skin rash, itching or hives, swelling of the face, lips, or tongue -breathing problems -changes in vision -fast, irregular heartbeat -high or low blood pressure -mouth sores -pain, tingling, numbness in the hands or feet -signs of decreased platelets or bleeding - bruising, pinpoint red spots on the skin, black, tarry stools, blood in the urine -signs of decreased red blood cells - unusually weak or tired, feeling faint or lightheaded, falls -signs of infection - fever or chills, cough, sore throat, pain or difficulty passing urine -signs and symptoms of liver injury like dark yellow or brown urine; general ill feeling or flu-like symptoms; light-colored stools; loss of appetite; nausea; right upper belly pain; unusually weak or tired; yellowing of the eyes or skin -swelling of the ankles, feet, hands -unusually slow heartbeat Side effects that usually do not require medical attention (report to your doctor or health care professional if they continue or are bothersome): -diarrhea -hair loss -loss of appetite -muscle or joint pain -nausea, vomiting -pain, redness, or irritation at site where injected -tiredness This list may not describe all possible side effects. Call your doctor for medical advice about side effects. You may report side effects  to FDA at 1-800-FDA-1088. Where should I keep my medicine? This drug is given in a hospital or clinic and will not be stored at home. NOTE: This sheet is a summary. It may not cover all possible information. If you have questions about this medicine,  talk to your doctor, pharmacist, or health care provider.  2019 Elsevier/Gold Standard (2016-10-24 13:14:55)

## 2018-07-24 ENCOUNTER — Telehealth: Payer: Self-pay | Admitting: *Deleted

## 2018-07-24 NOTE — Telephone Encounter (Signed)
RN placed call to follow after patient received first time Taxol chemotherapy.  Patient doing well.  Pt denies nausea, and diarrhea.  Tolerating fluids well.  All questions were answered.  No further needs.

## 2018-07-25 ENCOUNTER — Telehealth: Payer: Self-pay | Admitting: Genetic Counselor

## 2018-07-25 NOTE — Telephone Encounter (Signed)
Called patient regarding upcoming [facility] appointment, patient is notified and e-mail has been sent. °

## 2018-07-29 NOTE — Progress Notes (Signed)
Patient Care Team: [name], MD as PCP - General (Family Medicine)  DIAGNOSIS:    ICD-10-CM   1. Malignant neoplasm of upper-outer quadrant of left breast in female, estrogen receptor positive ([place]) C50.412    Z17.0     SUMMARY OF ONCOLOGIC HISTORY:   Malignant neoplasm of upper-outer quadrant of left breast in female, estrogen receptor positive ([place])   05/13/2018 Initial Diagnosis    Evaluation of 5 months of thickening of the left breast upper outer quadrant.  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  Several lymph nodes were identified by ultrasound.  T2 N1 stage IIb clinical stage    05/16/2018 Cancer Staging    Staging form: Breast, AJCC 8th Edition - Clinical stage from 05/16/2018: Stage IIB (cT2, cN1, cM0, G3, ER+, PR+, HER2-) - Signed by [name], MD on 05/16/2018    05/28/2018 -  Neo-Adjuvant Chemotherapy    Neoadjuvant chemotherapy with dose dense Adriamycin and Cytoxan followed by Taxol weekly x12     CHIEF COMPLIANT: Cycle 2 Taxol  INTERVAL HISTORY: Bethany Hanna is a 38 y.o. with above-mentioned history of left breast cancer who completed 4 cycles of neoadjuvant chemotherapy with dose dense Adriamycin and Cytoxan and is currently receiving weekly Taxol treatments. She presents to the clinic today for cycle 2.   She tolerated cycle 1 chemotherapy extremely well.  Did not have any side effects.  She had mild nausea for which she took Zofran.  REVIEW OF SYSTEMS:   Constitutional: Denies fevers, chills or abnormal weight loss Eyes: Denies blurriness of vision Ears, nose, mouth, throat, and face: Denies mucositis or sore throat Respiratory: Denies cough, dyspnea or wheezes Cardiovascular: Denies palpitation,  chest discomfort Gastrointestinal: Denies nausea, heartburn or change in bowel habits Skin: Denies abnormal skin rashes Lymphatics: Denies new lymphadenopathy or easy bruising Neurological: Denies numbness, tingling or new weaknesses Behavioral/Psych: Mood is stable, no new changes  Extremities: No lower extremity edema Breast: denies any pain or lumps or nodules in either breasts All other systems were reviewed with the patient and are negative.  I have reviewed the past medical history, past surgical history, social history and family history with the patient and they are unchanged from previous note.  ALLERGIES:  has No Known Allergies.  MEDICATIONS:  Current Outpatient Medications  Medication Sig Dispense Refill   acetaminophen (TYLENOL) 500 MG tablet Take 1,000 mg by mouth every 6 (six) hours as needed for moderate pain.      ALPRAZolam (XANAX) 0.5 MG tablet Take 1 tablet (0.5 mg total) by mouth at bedtime as needed for anxiety. 60 tablet 3   dexamethasone (DECADRON) 4 MG tablet Take 1 tablet day after chemo and 1 tablet 2 days after chemo with food 8 tablet 0   folic acid (FOLVITE) 1 MG tablet Take 1 mg by mouth daily.     lidocaine-prilocaine (EMLA) cream Apply to affected area once 30 g 3   LORazepam (ATIVAN) 0.5 MG tablet Take 1 tablet (0.5 mg total) by mouth at bedtime as needed (Nausea or vomiting). 30 tablet 1   mupirocin ointment (BACTROBAN) 2 % Apply to area 3 times daily until healed 30 g 0   ondansetron (ZOFRAN) 8 MG tablet Take 1 tablet (8 mg total) by mouth 2 (two)  times daily as needed. Start on the third day after chemotherapy. 30 tablet 1   prochlorperazine (COMPAZINE) 10 MG tablet Take 1 tablet (10 mg total) by mouth every 6 (six) hours as needed (Nausea or vomiting). 30 tablet 1   traMADol (ULTRAM) 50 MG tablet Take 50 mg by mouth every 6 (six) hours as needed for moderate pain.     No current facility-administered medications for this visit.      PHYSICAL EXAMINATION: ECOG PERFORMANCE STATUS: 1 - Symptomatic but completely ambulatory  Vitals:   07/30/18 1253  BP: (!) 148/91  Pulse: 95  Resp: 17  Temp: 98.2 F (36.8 C)  SpO2: 100%   Filed Weights   07/30/18 1253  Weight: 205 lb 12.8 oz (93.4 kg)    GENERAL: alert, no distress and comfortable SKIN: skin color, texture, turgor are normal, no rashes or significant lesions EYES: normal, Conjunctiva are pink and non-injected, sclera clear OROPHARYNX: no exudate, no erythema and lips, buccal mucosa, and tongue normal  NECK: supple, thyroid normal size, non-tender, without nodularity LYMPH: no palpable lymphadenopathy in the cervical, axillary or inguinal LUNGS: clear to auscultation and percussion with normal breathing effort HEART: regular rate & rhythm and no murmurs and no lower extremity edema ABDOMEN: abdomen soft, non-tender and normal bowel sounds MUSCULOSKELETAL: no cyanosis of digits and no clubbing  NEURO: alert & oriented x 3 with fluent speech, no focal motor/sensory deficits EXTREMITIES: No lower extremity edema  LABORATORY DATA:  I have reviewed the data as listed CMP Latest Ref Rng & Units 07/23/2018 07/09/2018 06/25/2018  Glucose 70 - 99 mg/dL 94 99 99  BUN 6 - 20 mg/dL 8 9 9   Creatinine 0.44 - 1.00 mg/dL 0.74 0.70 0.69  Sodium 135 - 145 mmol/L 135 140 136  Potassium 3.5 - 5.1 mmol/L 3.8 4.1 4.2  Chloride 98 - 111 mmol/L 100 105 107  CO2 22 - 32 mmol/L 24 25 20(L)  Calcium 8.9 - 10.3 mg/dL 9.5 9.1 8.9  Total Protein 6.5 - 8.1 g/dL 7.3 7.4 7.4  Total Bilirubin 0.3 - 1.2 mg/dL 0.3 0.2(L) <0.2(L)  Alkaline Phos 38 - 126 U/L 114 120 101  AST 15 - 41 U/L 13(L) 11(L) 14(L)  ALT 0 - 44 U/L 15 10 14     Lab Results  Component Value Date   WBC 4.5 07/30/2018   HGB 9.4 (L) 07/30/2018   HCT 28.4 (L) 07/30/2018   MCV 89.6 07/30/2018   PLT 437 (H) 07/30/2018   NEUTROABS 3.0 07/30/2018    ASSESSMENT & PLAN:  Malignant neoplasm of upper-outer quadrant of  left breast in female, estrogen receptor positive ([facility]) 05/13/2018:Evaluation of 5 months of thickening of the left breast upper outer quadrant. 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 Several lymph nodes were identified by ultrasound. T2 N1 stage IIb clinical stage  Treatment plan: 1.Neoadjuvant chemotherapy with dose dense Adriamycin and Cytoxan followed by Taxol weekly x12 2.Breast conserving surgery with targeted node dissection if possible 3.Adjuvant radiation therapy 4.Followed by adjuvant antiestrogen therapy ---------------------------------------------------------------------- Current treatment: Completed 4 cycles ofdose dense Adriamycin and Cytoxan, today cycle 2 of Taxol Echocardiogram 05/23/2018: EF 60 to 65% Labs reviewed  Chemo toxicities: Denies any nausea or vomiting. Complete loss of hair Mild fatigue Mild anemia: Today's hemoglobin is 9.4  and we are monitoring it closely.  Genetics counseling and testing will be done today. She informed me that her mother had an abnormality in the breast which has not been biopsied and that they have given her 83-monthfollow-up mammogram.  It was apparently the same location as her breast cancer.  Return to clinic in2 weeks for cycle 4 of Taxol    No orders of the defined types were placed in this encounter.  The patient has a good understanding of the overall plan. she agrees with it. she will call with any problems that may develop before the next visit here.  GNicholas Lose MD 07/30/2018  IJulious OkaDorshimer am acting as scribe for Dr. VNicholas Lose  I have reviewed the above documentation for accuracy and completeness, and I agree with the above.

## 2018-07-30 ENCOUNTER — Inpatient Hospital Stay: Payer: 59

## 2018-07-30 ENCOUNTER — Other Ambulatory Visit: Payer: Self-pay

## 2018-07-30 ENCOUNTER — Inpatient Hospital Stay (HOSPITAL_BASED_OUTPATIENT_CLINIC_OR_DEPARTMENT_OTHER): Payer: 59 | Admitting: Hematology and Oncology

## 2018-07-30 ENCOUNTER — Telehealth: Payer: Self-pay | Admitting: *Deleted

## 2018-07-30 ENCOUNTER — Inpatient Hospital Stay: Payer: 59 | Admitting: Genetic Counselor

## 2018-07-30 ENCOUNTER — Other Ambulatory Visit: Payer: Self-pay | Admitting: Genetic Counselor

## 2018-07-30 DIAGNOSIS — Z79899 Other long term (current) drug therapy: Secondary | ICD-10-CM

## 2018-07-30 DIAGNOSIS — C773 Secondary and unspecified malignant neoplasm of axilla and upper limb lymph nodes: Secondary | ICD-10-CM

## 2018-07-30 DIAGNOSIS — Z170 Estrogen receptor positive status [ER+]: Secondary | ICD-10-CM

## 2018-07-30 DIAGNOSIS — C50412 Malignant neoplasm of upper-outer quadrant of left female breast: Secondary | ICD-10-CM

## 2018-07-30 DIAGNOSIS — Z5111 Encounter for antineoplastic chemotherapy: Secondary | ICD-10-CM | POA: Diagnosis not present

## 2018-07-30 DIAGNOSIS — R110 Nausea: Secondary | ICD-10-CM | POA: Diagnosis not present

## 2018-07-30 DIAGNOSIS — L658 Other specified nonscarring hair loss: Secondary | ICD-10-CM

## 2018-07-30 DIAGNOSIS — Z95828 Presence of other vascular implants and grafts: Secondary | ICD-10-CM

## 2018-07-30 DIAGNOSIS — F419 Anxiety disorder, unspecified: Secondary | ICD-10-CM

## 2018-07-30 DIAGNOSIS — R5383 Other fatigue: Secondary | ICD-10-CM

## 2018-07-30 LAB — CMP (CANCER CENTER ONLY)
ALT: 26 U/L (ref 0–44)
AST: 16 U/L (ref 15–41)
Albumin: 3.9 g/dL (ref 3.5–5.0)
Alkaline Phosphatase: 77 U/L (ref 38–126)
Anion gap: 8 (ref 5–15)
BUN: 7 mg/dL (ref 6–20)
CO2: 24 mmol/L (ref 22–32)
Calcium: 9 mg/dL (ref 8.9–10.3)
Chloride: 107 mmol/L (ref 98–111)
Creatinine: 0.66 mg/dL (ref 0.44–1.00)
GFR, Est AFR Am: >60 mL/min (ref >60)
GFR, Estimated: >60 mL/min (ref >60)
Glucose, Bld: 109 mg/dL — ABNORMAL HIGH (ref 70–99)
Potassium: 3.6 mmol/L (ref 3.5–5.1)
Sodium: 139 mmol/L (ref 135–145)
Total Bilirubin: 0.3 mg/dL (ref 0.3–1.2)
Total Protein: 7.1 g/dL (ref 6.5–8.1)

## 2018-07-30 LAB — CBC WITH DIFFERENTIAL (CANCER CENTER ONLY)
Abs Immature Granulocytes: 0.02 10*3/uL (ref 0.00–0.07)
Basophils Absolute: 0 10*3/uL (ref 0.0–0.1)
Basophils Relative: 1 %
Eosinophils Absolute: 0 10*3/uL (ref 0.0–0.5)
Eosinophils Relative: 0 %
HCT: 28.4 % — ABNORMAL LOW (ref 36.0–46.0)
Hemoglobin: 9.4 g/dL — ABNORMAL LOW (ref 12.0–15.0)
Immature Granulocytes: 0 %
Lymphocytes Relative: 16 %
Lymphs Abs: 0.7 10*3/uL (ref 0.7–4.0)
MCH: 29.7 pg (ref 26.0–34.0)
MCHC: 33.1 g/dL (ref 30.0–36.0)
MCV: 89.6 fL (ref 80.0–100.0)
Monocytes Absolute: 0.7 10*3/uL (ref 0.1–1.0)
Monocytes Relative: 16 %
Neutro Abs: 3 10*3/uL (ref 1.7–7.7)
Neutrophils Relative %: 67 %
Platelet Count: 437 10*3/uL — ABNORMAL HIGH (ref 150–400)
RBC: 3.17 MIL/uL — ABNORMAL LOW (ref 3.87–5.11)
RDW: 15.2 % (ref 11.5–15.5)
WBC Count: 4.5 10*3/uL (ref 4.0–10.5)
nRBC: 0 % (ref 0.0–0.2)

## 2018-07-30 MED ORDER — SODIUM CHLORIDE 0.9 % IV SOLN
80.0000 mg/m2 | Freq: Once | INTRAVENOUS | Status: AC
  Administered 2018-07-30: 168 mg via INTRAVENOUS
  Filled 2018-07-30: qty 28

## 2018-07-30 MED ORDER — DEXAMETHASONE SODIUM PHOSPHATE 10 MG/ML IJ SOLN
10.0000 mg | Freq: Once | INTRAMUSCULAR | Status: AC
  Administered 2018-07-30: 10 mg via INTRAVENOUS

## 2018-07-30 MED ORDER — FAMOTIDINE IN NACL 20-0.9 MG/50ML-% IV SOLN
20.0000 mg | Freq: Once | INTRAVENOUS | Status: AC
  Administered 2018-07-30: 20 mg via INTRAVENOUS

## 2018-07-30 MED ORDER — DIPHENHYDRAMINE HCL 50 MG/ML IJ SOLN
50.0000 mg | Freq: Once | INTRAMUSCULAR | Status: AC
  Administered 2018-07-30: 50 mg via INTRAVENOUS

## 2018-07-30 MED ORDER — FAMOTIDINE IN NACL 20-0.9 MG/50ML-% IV SOLN
INTRAVENOUS | Status: AC
  Filled 2018-07-30: qty 50

## 2018-07-30 MED ORDER — DEXAMETHASONE SODIUM PHOSPHATE 10 MG/ML IJ SOLN
INTRAMUSCULAR | Status: AC
  Filled 2018-07-30: qty 1

## 2018-07-30 MED ORDER — DIPHENHYDRAMINE HCL 50 MG/ML IJ SOLN
INTRAMUSCULAR | Status: AC
  Filled 2018-07-30: qty 1

## 2018-07-30 MED ORDER — SODIUM CHLORIDE 0.9% FLUSH
10.0000 mL | INTRAVENOUS | Status: DC | PRN
  Administered 2018-07-30: 10 mL
  Filled 2018-07-30: qty 10

## 2018-07-30 MED ORDER — HEPARIN SOD (PORK) LOCK FLUSH 100 UNIT/ML IV SOLN
500.0000 [IU] | Freq: Once | INTRAVENOUS | Status: AC | PRN
  Administered 2018-07-30: 500 [IU]
  Filled 2018-07-30: qty 5

## 2018-07-30 MED ORDER — SODIUM CHLORIDE 0.9 % IV SOLN
Freq: Once | INTRAVENOUS | Status: AC
  Administered 2018-07-30: 14:00:00 via INTRAVENOUS
  Filled 2018-07-30: qty 250

## 2018-07-30 NOTE — Telephone Encounter (Signed)
Called pt to check in and assess needs during chemo. Relate doing well and denies questions or needs at this time. Encourage pt to call with concerns. Received verbal understanding.

## 2018-07-30 NOTE — Patient Instructions (Signed)
Coronavirus (COVID-19) Are you at risk?  Are you at risk for the Coronavirus (COVID-19)?  To be considered HIGH RISK for Coronavirus (COVID-19), you have to meet the following criteria:  . Traveled to [place], [place], [place], [place] or [place]; or in the [place] to [place], [place], [place], or [place]; and have fever, cough, and shortness of breath within the last 2 weeks of travel OR . Been in close contact with a person diagnosed with COVID-19 within the last 2 weeks and have fever, cough, and shortness of breath . IF YOU DO NOT MEET THESE CRITERIA, YOU ARE CONSIDERED LOW RISK FOR COVID-19.  What to do if you are HIGH RISK for COVID-19?  . If you are having a medical emergency, call 911. . Seek medical care right away. Before you go to a doctor's office, urgent care or emergency department, call ahead and tell them about your recent travel, contact with someone diagnosed with COVID-19, and your symptoms. You should receive instructions from your physician's office regarding next steps of care.  . When you arrive at healthcare provider, tell the healthcare staff immediately you have returned from visiting [place], [place], [place], [place] or [place]; or traveled in the [place] to [place], [place], [place], or [place]; in the last two weeks or you have been in close contact with a person diagnosed with COVID-19 in the last 2 weeks.   . Tell the health care staff about your symptoms: fever, cough and shortness of breath. . After you have been seen by a medical provider, you will be either: o Tested for (COVID-19) and discharged home on quarantine except to seek medical care if symptoms worsen, and asked to  - Stay home and avoid contact with others until you get your results (4-5 days)  - Avoid travel on public transportation if possible (such as bus, train, or airplane) or o Sent to the Emergency Department by EMS for evaluation, COVID-19 testing, and possible  admission depending on your condition and test results.  What to do if you are LOW RISK for COVID-19?  Reduce your risk of any infection by using the same precautions used for avoiding the common cold or flu:  . Wash your hands often with soap and warm water for at least 20 seconds.  If soap and water are not readily available, use an alcohol-based hand sanitizer with at least 60% alcohol.  . If coughing or sneezing, cover your mouth and nose by coughing or sneezing into the elbow areas of your shirt or coat, into a tissue or into your sleeve (not your hands). . Avoid shaking hands with others and consider head nods or verbal greetings only. . Avoid touching your eyes, nose, or mouth with unwashed hands.  . Avoid close contact with people who are sick. . Avoid places or events with large numbers of people in one location, like concerts or sporting events. . Carefully consider travel plans you have or are making. . If you are planning any travel outside or inside the US, visit the CDC's Travelers' Health webpage for the latest health notices. . If you have some symptoms but not all symptoms, continue to monitor at home and seek medical attention if your symptoms worsen. . If you are having a medical emergency, call 911.   ADDITIONAL HEALTHCARE OPTIONS FOR PATIENTS  [facility] / e-Visit: [email or web address]         [facility]: [phone]  [facility]: [phone]                   [facility]: [phone]   [facility] Discharge Instructions for Patients Receiving Chemotherapy  Today you received the following chemotherapy agents: Paclitaxel (Taxol)  To help prevent nausea and vomiting after your treatment, we encourage you to take your nausea medication as directed.    If you develop nausea and vomiting that is not controlled by your nausea medication, call the clinic.    BELOW ARE SYMPTOMS THAT SHOULD BE REPORTED IMMEDIATELY:  *FEVER GREATER THAN 100.5 F  *CHILLS WITH OR WITHOUT FEVER  NAUSEA AND VOMITING THAT IS NOT CONTROLLED WITH YOUR NAUSEA MEDICATION  *UNUSUAL SHORTNESS OF BREATH  *UNUSUAL BRUISING OR BLEEDING  TENDERNESS IN MOUTH AND THROAT WITH OR WITHOUT PRESENCE OF ULCERS  *URINARY PROBLEMS  *BOWEL PROBLEMS  UNUSUAL RASH Items with * indicate a potential emergency and should be followed up as soon as possible.  Feel free to call the clinic should you have any questions or concerns. The clinic phone number is [phone].  Please show the CHEMO ALERT CARD at check-in to the Emergency Department and triage nurse.   

## 2018-07-30 NOTE — Assessment & Plan Note (Signed)
05/13/2018:Evaluation of 5 months of thickening of the left breast upper outer quadrant. 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 Several lymph nodes were identified by ultrasound. T2 N1 stage IIb clinical stage  Treatment plan: 1.Neoadjuvant chemotherapy with dose dense Adriamycin and Cytoxan followed by Taxol weekly x12 2.Breast conserving surgery with targeted node dissection if possible 3.Adjuvant radiation therapy 4.Followed by adjuvant antiestrogen therapy ---------------------------------------------------------------------- Current treatment: Completed 4 cycles ofdose dense Adriamycin and Cytoxan, today cycle 2 of Taxol Echocardiogram 05/23/2018: EF 60 to 65% Labs reviewed  Chemo toxicities: Denies any nausea or vomiting. Complete loss of hair Mild fatigue Mild anemia  Patient has not had genetic testing yet we will schedule her for genetics. Return to clinic in2 weeks for cycle 4 of Taxol 

## 2018-07-31 ENCOUNTER — Telehealth: Payer: Self-pay | Admitting: Genetic Counselor

## 2018-07-31 NOTE — Telephone Encounter (Signed)
Left voicemail to confirm appt and verify information.

## 2018-08-01 ENCOUNTER — Encounter: Payer: Self-pay | Admitting: Genetic Counselor

## 2018-08-01 ENCOUNTER — Inpatient Hospital Stay (HOSPITAL_BASED_OUTPATIENT_CLINIC_OR_DEPARTMENT_OTHER): Payer: 59 | Admitting: Genetic Counselor

## 2018-08-01 DIAGNOSIS — C50412 Malignant neoplasm of upper-outer quadrant of left female breast: Secondary | ICD-10-CM

## 2018-08-01 DIAGNOSIS — Z800 Family history of malignant neoplasm of digestive organs: Secondary | ICD-10-CM | POA: Diagnosis not present

## 2018-08-01 DIAGNOSIS — Z808 Family history of malignant neoplasm of other organs or systems: Secondary | ICD-10-CM | POA: Diagnosis not present

## 2018-08-01 DIAGNOSIS — Z170 Estrogen receptor positive status [ER+]: Secondary | ICD-10-CM

## 2018-08-01 NOTE — Progress Notes (Signed)
REFERRING PROVIDER: [name], MD [place], [place] [id number]  PRIMARY PROVIDER:  [name], MD  PRIMARY REASON FOR VISIT:  1. Malignant neoplasm of upper-outer quadrant of left breast in female, estrogen receptor positive ([facility])   2. Family history of colon cancer   3. Family history of melanoma      HISTORY OF PRESENT ILLNESS:  I connected with Bethany Hanna on 08/01/2018 at 10 AM EDT by Webex video conference and verified that I am speaking with the correct person using two identifiers.   Patient location: Home Provider location: Office   Bethany Hanna, a 38 y.o. female, was seen for a [facility] cancer genetics consultation at the request of Dr. Yong Channel due to a personal and family history of cancer.  Bethany Hanna presents to clinic today to discuss the possibility of a hereditary predisposition to cancer, genetic testing, and to further clarify her future cancer risks, as well as potential cancer risks for family members.   In February 2020, at the age of 43, Bethany Hanna was diagnosed with invasive ductal carcinoma of the left breast. The treatment plan chemotherapy, lumpectomy and radiation.    CANCER HISTORY:    Malignant neoplasm of upper-outer quadrant of left breast in female, estrogen receptor positive ([facility])   05/13/2018 Initial Diagnosis    Evaluation of 5 months of thickening of the left breast upper outer quadrant.  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  Several lymph nodes were identified by ultrasound.  T2 N1 stage IIb clinical stage    05/16/2018 Cancer Staging    Staging form: Breast, AJCC 8th Edition - Clinical stage from 05/16/2018: Stage IIB (cT2, cN1, cM0, G3, ER+, PR+, HER2-) - Signed by [name],  MD on 05/16/2018    05/28/2018 -  Neo-Adjuvant Chemotherapy    Neoadjuvant chemotherapy with dose dense Adriamycin and Cytoxan followed by Taxol weekly x12      RISK FACTORS:  Menarche was at age 65-13.  First live birth at age 4.  Ovaries intact: yes.  Hysterectomy: no.  Menopausal status: premenopausal.  HRT use: 0 years. Has been on infertility medication for about 5 years. Colonoscopy: no; not examined. Mammogram within the last year: yes. Number of breast biopsies: 1. Up to date with pelvic exams: yes. Any excessive radiation exposure in the past: no  Past Medical History:  Diagnosis Date   Cancer ([facility]) 05/05/2018   Breast caner   Family history of colon cancer    Family history of melanoma     Past Surgical History:  Procedure Laterality Date   CHOLECYSTECTOMY N/A 04/09/2015   Procedure: LAPAROSCOPIC CHOLECYSTECTOMY WITH INTRAOPERATIVE CHOLANGIOGRAM;  Surgeon: Autumn Messing III, MD;  Location: [facility];  Service: General;  Laterality: N/A;   DILATATION & CURRETTAGE/HYSTEROSCOPY WITH RESECTOCOPE N/A 07/31/2013   Procedure: [place];  Surgeon: Princess Bruins, MD;  Location: [facility];  Service: Gynecology;  Laterality: N/A;  1 hr.   DILATION AND EVACUATION N/A 12/12/2017   Procedure: DILATATION AND EVACUATION (D&E) 2ND TRIMESTER;  Surgeon: Brien Few, MD;  Location: [facility];  Service: Gynecology;  Laterality: N/A;   OPERATIVE ULTRASOUND N/A 12/12/2017   Procedure: OPERATIVE ULTRASOUND;  Surgeon: Brien Few, MD;  Location: [facility];  Service: Gynecology;  Laterality: N/A;   PORTACATH PLACEMENT Right 05/27/2018   Procedure: INSERTION PORT-A-CATH WITH ULTRASOUND;  Surgeon: [name], MD;  Location: [facility];  Service: General;  Laterality: Right;  GENERAL AND LMA    Social History   Socioeconomic History   Marital status: Married    Spouse name: Not on file   Number of children: Not on file    Years of education: Not on file   Highest education level: Not on file  Occupational History   Not on file  Social Needs   Financial resource strain: Not on file   Food insecurity:    Worry: Not on file    Inability: Not on file   Transportation needs:    Medical: Not on file    Non-medical: Not on file  Tobacco Use   Smoking status: Never Smoker   Smokeless tobacco: Never Used  Substance and Sexual Activity   Alcohol use: Yes    Alcohol/week: 2.0 - 3.0 standard drinks    Types: 2 - 3 Glasses of wine per week   Drug use: No   Sexual activity: Yes  Lifestyle   Physical activity:    Days per week: Not on file    Minutes per session: Not on file   Stress: Not on file  Relationships   Social connections:    Talks on phone: Not on file    Gets together: Not on file    Attends religious service: Not on file    Active member of club or organization: Not on file    Attends meetings of clubs or organizations: Not on file    Relationship status: Not on file  Other Topics Concern   Not on file  Social History Narrative   Married- see husband Catalina [place]ig[place]- have 5 and 34 year old in 2020.       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  Significant diagnoses are listed below: Family History  Problem Relation Age of Onset   Arthritis Mother    Migraines Mother    ADD / ADHD Mother    Varicose Veins Mother    Hypertension Father    Alcohol abuse Brother    Varicose Veins Brother    Stroke Maternal Grandmother    Melanoma Maternal Grandmother    Pulmonary fibrosis Maternal Grandfather    Mitral valve prolapse Paternal Grandmother    Migraines Paternal Grandmother    Lymphoma Paternal Grandfather        was treated for this and did ok   Heart failure Paternal  Grandfather    Eczema Son    Colon cancer Maternal Aunt 53    The patient has two sons who are cancer free.  She also had a stillborn son in October 2019.  She has one brother who is cancer free.  Both parents are living.  The patient's mother is healthy and cancer free.  She has one sister who has colon cancer at 76.  The maternal grandmother had melanoma in her 3's, and the grandfather died of non cancer related issues.  The patient's father is healthy and cancer free.  He has two brothers who are cancer free.  Both paternal grandparents are deceased.  Bethany Hanna is unaware of previous family history of genetic testing for hereditary  cancer risks. Patient's maternal ancestors are of [place] descent, and paternal ancestors are of [place] and [place] descent. There is no reported Ashkenazi Jewish ancestry. There is no known consanguinity.   GENETIC COUNSELING ASSESSMENT: Bethany Hanna is a 38 y.o. female with a personal and family history of cancer which is somewhat suggestive of a hereditary cancer syndrome and predisposition to cancer. We, therefore, discussed and recommended the following at today's visit.   DISCUSSION: We discussed that 5 - 10% of breast cancer is hereditary, with most cases associated with BRCA mutations.  There are other genes that can be associated with hereditary breast cancer syndromes.  These include ATM, CHEK2 and PALB2.  We discussed that her mother recently found a lump that is being followed.  If she tests positive for a hereditary mutation we would recommend testing of both parents to determine the side of the family it is coming from.  Based on the limited family history on the paternal side, there is a chance that something could be hidden in the men.  However, if we would also want to test her mother to ensure that there is not a syndrome on that side of the family.  We discussed that testing is beneficial for several reasons including knowing how to follow individuals  after completing their treatment, identifying whether potential treatment options such as PARP inhibitors would be beneficial, and understand if other family members could be at risk for cancer and allow them to undergo genetic testing.   We reviewed the characteristics, features and inheritance patterns of hereditary cancer syndromes. We also discussed genetic testing, including the appropriate family members to test, the process of testing, insurance coverage and turn-around-time for results. We discussed the implications of a negative, positive and/or variant of uncertain significant result. We recommended Bethany Hanna pursue genetic testing for the multi-cancer gene panel. The Multi-Gene Panel offered by [facility] includes sequencing and/or deletion duplication testing of the following 85 genes: AIP, ALK, APC, ATM, AXIN2,BAP1,  BARD1, BLM, BMPR1A, BRCA1, BRCA2, BRIP1, CASR, CDC73, CDH1, CDK4, CDKN1B, CDKN1C, CDKN2A (p14ARF), CDKN2A (p16INK4a), CEBPA, CHEK2, CTNNA1, DICER1, DIS3L2, EGFR (c.2369C>T, p.Thr790Met variant only), EPCAM (Deletion/duplication testing only), FH, FLCN, GATA2, GPC3, GREM1 (Promoter region deletion/duplication testing only), HOXB13 (c.251G>A, p.Gly84Glu), HRAS, KIT, MAX, MEN1, MET, MITF (c.952G>A, p.Glu318Lys variant only), MLH1, MSH2, MSH3, MSH6, MUTYH, NBN, NF1, NF2, NTHL1, PALB2, PDGFRA, PHOX2B, PMS2, POLD1, POLE, POT1, PRKAR1A, PTCH1, PTEN, RAD50, RAD51C, RAD51D, RB1, RECQL4, RET, RNF43, RUNX1, SDHAF2, SDHA (sequence changes only), SDHB, SDHC, SDHD, SMAD4, SMARCA4, SMARCB1, SMARCE1, STK11, SUFU, TERC, TERT, TMEM127, TP53, TSC1, TSC2, VHL, WRN and WT1.    Based on Bethany Hanna's personal and family history of cancer, she meets medical criteria for genetic testing. Despite that she meets criteria, she may still have an out of pocket cost. We discussed that if her out of pocket cost for testing is over $100, the laboratory will call and confirm whether she wants to proceed with testing.  If the  out of pocket cost of testing is less than $100 she will be billed by the genetic testing laboratory.   PLAN: After considering the risks, benefits, and limitations, Bethany Hanna provided informed consent to pursue genetic testing.  Blood will be drawn at her next appointment and will be sent to [facility] for analysis of the multi cancer gene panel. Results should be available within approximately 2-3 weeks' time, at which point they will be disclosed by telephone to Bethany Hanna, as will any additional recommendations warranted by  these results. Bethany Hanna will receive a summary of her genetic counseling visit and a copy of her results once available. This information will also be available in Epic.   Lastly, we encouraged Bethany Hanna to remain in contact with cancer genetics annually so that we can continuously update the family history and inform her of any changes in cancer genetics and testing that may be of benefit for this family.   Bethany Hanna questions were answered to her satisfaction today. Our contact information was provided should additional questions or concerns arise. Thank you for the referral and allowing [place] to share in the care of your patient.   Damario Gillie P. Florene Glen, [place], [facility] Certified Genetic Counselor [email or web address] phone: [phone]  The patient was seen for a total of 45 minutes in face-to-face genetic counseling.  This patient was discussed with Drs. Magrinat, Lindi Adie and/or Burr Medico who agrees with the above.    _______________________________________________________________________ For Office Staff:  Number of people involved in session: 1 Was an Intern/ student involved with case: no

## 2018-08-06 ENCOUNTER — Inpatient Hospital Stay: Payer: 59

## 2018-08-06 ENCOUNTER — Other Ambulatory Visit: Payer: Self-pay

## 2018-08-06 ENCOUNTER — Inpatient Hospital Stay: Payer: 59 | Attending: Hematology and Oncology

## 2018-08-06 VITALS — BP 121/86 | HR 9 | Temp 99.2°F | Resp 16

## 2018-08-06 DIAGNOSIS — C50412 Malignant neoplasm of upper-outer quadrant of left female breast: Secondary | ICD-10-CM

## 2018-08-06 DIAGNOSIS — Z95828 Presence of other vascular implants and grafts: Secondary | ICD-10-CM

## 2018-08-06 DIAGNOSIS — Z79899 Other long term (current) drug therapy: Secondary | ICD-10-CM | POA: Diagnosis not present

## 2018-08-06 DIAGNOSIS — Z170 Estrogen receptor positive status [ER+]: Secondary | ICD-10-CM | POA: Insufficient documentation

## 2018-08-06 DIAGNOSIS — Z5111 Encounter for antineoplastic chemotherapy: Secondary | ICD-10-CM | POA: Diagnosis present

## 2018-08-06 DIAGNOSIS — Z800 Family history of malignant neoplasm of digestive organs: Secondary | ICD-10-CM | POA: Insufficient documentation

## 2018-08-06 LAB — CBC WITH DIFFERENTIAL (CANCER CENTER ONLY)
Abs Immature Granulocytes: 0.03 10*3/uL (ref 0.00–0.07)
Basophils Absolute: 0.1 10*3/uL (ref 0.0–0.1)
Basophils Relative: 1 %
Eosinophils Absolute: 0 10*3/uL (ref 0.0–0.5)
Eosinophils Relative: 1 %
HCT: 29.7 % — ABNORMAL LOW (ref 36.0–46.0)
Hemoglobin: 9.8 g/dL — ABNORMAL LOW (ref 12.0–15.0)
Immature Granulocytes: 1 %
Lymphocytes Relative: 22 %
Lymphs Abs: 0.8 10*3/uL (ref 0.7–4.0)
MCH: 29.6 pg (ref 26.0–34.0)
MCHC: 33 g/dL (ref 30.0–36.0)
MCV: 89.7 fL (ref 80.0–100.0)
Monocytes Absolute: 0.6 10*3/uL (ref 0.1–1.0)
Monocytes Relative: 16 %
Neutro Abs: 2.2 10*3/uL (ref 1.7–7.7)
Neutrophils Relative %: 59 %
Platelet Count: 390 10*3/uL (ref 150–400)
RBC: 3.31 MIL/uL — ABNORMAL LOW (ref 3.87–5.11)
RDW: 16.7 % — ABNORMAL HIGH (ref 11.5–15.5)
WBC Count: 3.7 10*3/uL — ABNORMAL LOW (ref 4.0–10.5)
nRBC: 0 % (ref 0.0–0.2)

## 2018-08-06 LAB — CMP (CANCER CENTER ONLY)
ALT: 38 U/L (ref 0–44)
AST: 25 U/L (ref 15–41)
Albumin: 4 g/dL (ref 3.5–5.0)
Alkaline Phosphatase: 76 U/L (ref 38–126)
Anion gap: 7 (ref 5–15)
BUN: 7 mg/dL (ref 6–20)
CO2: 26 mmol/L (ref 22–32)
Calcium: 9.3 mg/dL (ref 8.9–10.3)
Chloride: 105 mmol/L (ref 98–111)
Creatinine: 0.69 mg/dL (ref 0.44–1.00)
GFR, Est AFR Am: >60 mL/min (ref >60)
GFR, Estimated: >60 mL/min (ref >60)
Glucose, Bld: 94 mg/dL (ref 70–99)
Potassium: 3.7 mmol/L (ref 3.5–5.1)
Sodium: 138 mmol/L (ref 135–145)
Total Bilirubin: 0.4 mg/dL (ref 0.3–1.2)
Total Protein: 7.1 g/dL (ref 6.5–8.1)

## 2018-08-06 MED ORDER — FAMOTIDINE IN NACL 20-0.9 MG/50ML-% IV SOLN
INTRAVENOUS | Status: AC
  Filled 2018-08-06: qty 50

## 2018-08-06 MED ORDER — SODIUM CHLORIDE 0.9% FLUSH
10.0000 mL | INTRAVENOUS | Status: DC | PRN
  Administered 2018-08-06: 10 mL
  Filled 2018-08-06: qty 10

## 2018-08-06 MED ORDER — DEXAMETHASONE SODIUM PHOSPHATE 10 MG/ML IJ SOLN: 10.0000 mg | Freq: Once | INTRAMUSCULAR | Status: DC

## 2018-08-06 MED ORDER — DEXAMETHASONE SODIUM PHOSPHATE 10 MG/ML IJ SOLN
INTRAMUSCULAR | Status: AC
  Filled 2018-08-06: qty 1

## 2018-08-06 MED ORDER — DIPHENHYDRAMINE HCL 50 MG/ML IJ SOLN
50.0000 mg | Freq: Once | INTRAMUSCULAR | Status: AC
  Administered 2018-08-06: 50 mg via INTRAVENOUS

## 2018-08-06 MED ORDER — SODIUM CHLORIDE 0.9 % IV SOLN
Freq: Once | INTRAVENOUS | Status: AC
  Administered 2018-08-06: 15:00:00 via INTRAVENOUS
  Filled 2018-08-06: qty 250

## 2018-08-06 MED ORDER — HEPARIN SOD (PORK) LOCK FLUSH 100 UNIT/ML IV SOLN
500.0000 [IU] | Freq: Once | INTRAVENOUS | Status: AC | PRN
  Administered 2018-08-06: 500 [IU]
  Filled 2018-08-06: qty 5

## 2018-08-06 MED ORDER — DIPHENHYDRAMINE HCL 50 MG/ML IJ SOLN
INTRAMUSCULAR | Status: AC
  Filled 2018-08-06: qty 1

## 2018-08-06 MED ORDER — SODIUM CHLORIDE 0.9 % IV SOLN
80.0000 mg/m2 | Freq: Once | INTRAVENOUS | Status: AC
  Administered 2018-08-06: 168 mg via INTRAVENOUS
  Filled 2018-08-06: qty 28

## 2018-08-06 MED ORDER — SODIUM CHLORIDE 0.9 % IV SOLN: 10.0000 mg | Freq: Once | INTRAVENOUS | Status: DC

## 2018-08-06 MED ORDER — FAMOTIDINE IN NACL 20-0.9 MG/50ML-% IV SOLN
20.0000 mg | Freq: Once | INTRAVENOUS | Status: AC
  Administered 2018-08-06: 20 mg via INTRAVENOUS

## 2018-08-06 MED ORDER — SODIUM CHLORIDE 0.9 % IV SOLN
10.0000 mg | Freq: Once | INTRAVENOUS | Status: AC
  Administered 2018-08-06: 10 mg via INTRAVENOUS
  Filled 2018-08-06: qty 1

## 2018-08-06 NOTE — Patient Instructions (Signed)
Coronavirus (COVID-19) Are you at risk?  Are you at risk for the Coronavirus (COVID-19)?  To be considered HIGH RISK for Coronavirus (COVID-19), you have to meet the following criteria:  . Traveled to [place], [place], [place], [place] or [place]; or in the [place] to [place], [place], [place], or [place]; and have fever, cough, and shortness of breath within the last 2 weeks of travel OR . Been in close contact with a person diagnosed with COVID-19 within the last 2 weeks and have fever, cough, and shortness of breath . IF YOU DO NOT MEET THESE CRITERIA, YOU ARE CONSIDERED LOW RISK FOR COVID-19.  What to do if you are HIGH RISK for COVID-19?  . If you are having a medical emergency, call 911. . Seek medical care right away. Before you go to a doctor's office, urgent care or emergency department, call ahead and tell them about your recent travel, contact with someone diagnosed with COVID-19, and your symptoms. You should receive instructions from your physician's office regarding next steps of care.  . When you arrive at healthcare provider, tell the healthcare staff immediately you have returned from visiting [place], [place], [place], [place] or [place]; or traveled in the [place] to [place], [place], [place], or [place]; in the last two weeks or you have been in close contact with a person diagnosed with COVID-19 in the last 2 weeks.   . Tell the health care staff about your symptoms: fever, cough and shortness of breath. . After you have been seen by a medical provider, you will be either: o Tested for (COVID-19) and discharged home on quarantine except to seek medical care if symptoms worsen, and asked to  - Stay home and avoid contact with others until you get your results (4-5 days)  - Avoid travel on public transportation if possible (such as bus, train, or airplane) or o Sent to the Emergency Department by EMS for evaluation, COVID-19 testing, and possible  admission depending on your condition and test results.  What to do if you are LOW RISK for COVID-19?  Reduce your risk of any infection by using the same precautions used for avoiding the common cold or flu:  . Wash your hands often with soap and warm water for at least 20 seconds.  If soap and water are not readily available, use an alcohol-based hand sanitizer with at least 60% alcohol.  . If coughing or sneezing, cover your mouth and nose by coughing or sneezing into the elbow areas of your shirt or coat, into a tissue or into your sleeve (not your hands). . Avoid shaking hands with others and consider head nods or verbal greetings only. . Avoid touching your eyes, nose, or mouth with unwashed hands.  . Avoid close contact with people who are sick. . Avoid places or events with large numbers of people in one location, like concerts or sporting events. . Carefully consider travel plans you have or are making. . If you are planning any travel outside or inside the [place], visit the CDC's Travelers' Health webpage for the latest health notices. . If you have some symptoms but not all symptoms, continue to monitor at home and seek medical attention if your symptoms worsen. . If you are having a medical emergency, call 911.   ADDITIONAL HEALTHCARE OPTIONS FOR PATIENTS  [facility] / e-Visit: [email or web address]         [facility]: [phone]  [facility]: [phone]                   [facility]: [phone]   [facility] Discharge Instructions for Patients Receiving Chemotherapy  Today you received the following chemotherapy agents: Paclitaxel (Taxol)  To help prevent nausea and vomiting after your treatment, we encourage you to take your nausea medication as directed.    If you develop nausea and vomiting that is not controlled by your nausea medication, call the clinic.    BELOW ARE SYMPTOMS THAT SHOULD BE REPORTED IMMEDIATELY:  *FEVER GREATER THAN 100.5 F  *CHILLS WITH OR WITHOUT FEVER  NAUSEA AND VOMITING THAT IS NOT CONTROLLED WITH YOUR NAUSEA MEDICATION  *UNUSUAL SHORTNESS OF BREATH  *UNUSUAL BRUISING OR BLEEDING  TENDERNESS IN MOUTH AND THROAT WITH OR WITHOUT PRESENCE OF ULCERS  *URINARY PROBLEMS  *BOWEL PROBLEMS  UNUSUAL RASH Items with * indicate a potential emergency and should be followed up as soon as possible.  Feel free to call the clinic should you have any questions or concerns. The clinic phone number is [phone].  Please show the CHEMO ALERT CARD at check-in to the Emergency Department and triage nurse.   

## 2018-08-13 ENCOUNTER — Inpatient Hospital Stay: Payer: 59

## 2018-08-13 ENCOUNTER — Other Ambulatory Visit: Payer: Self-pay

## 2018-08-13 ENCOUNTER — Inpatient Hospital Stay (HOSPITAL_BASED_OUTPATIENT_CLINIC_OR_DEPARTMENT_OTHER): Payer: 59 | Admitting: Adult Health

## 2018-08-13 ENCOUNTER — Encounter: Payer: Self-pay | Admitting: Adult Health

## 2018-08-13 ENCOUNTER — Telehealth: Payer: Self-pay | Admitting: *Deleted

## 2018-08-13 VITALS — BP 149/86 | HR 103 | Temp 99.1°F | Resp 20 | Ht 68.0 in | Wt 204.6 lb

## 2018-08-13 VITALS — HR 97

## 2018-08-13 DIAGNOSIS — C50412 Malignant neoplasm of upper-outer quadrant of left female breast: Secondary | ICD-10-CM | POA: Diagnosis not present

## 2018-08-13 DIAGNOSIS — Z5111 Encounter for antineoplastic chemotherapy: Secondary | ICD-10-CM | POA: Diagnosis not present

## 2018-08-13 DIAGNOSIS — Z79899 Other long term (current) drug therapy: Secondary | ICD-10-CM | POA: Diagnosis not present

## 2018-08-13 DIAGNOSIS — Z800 Family history of malignant neoplasm of digestive organs: Secondary | ICD-10-CM | POA: Diagnosis not present

## 2018-08-13 DIAGNOSIS — Z170 Estrogen receptor positive status [ER+]: Secondary | ICD-10-CM

## 2018-08-13 DIAGNOSIS — Z95828 Presence of other vascular implants and grafts: Secondary | ICD-10-CM

## 2018-08-13 LAB — CMP (CANCER CENTER ONLY)
ALT: 39 U/L (ref 0–44)
AST: 23 U/L (ref 15–41)
Albumin: 4.1 g/dL (ref 3.5–5.0)
Alkaline Phosphatase: 68 U/L (ref 38–126)
Anion gap: 10 (ref 5–15)
BUN: 8 mg/dL (ref 6–20)
CO2: 23 mmol/L (ref 22–32)
Calcium: 9.2 mg/dL (ref 8.9–10.3)
Chloride: 105 mmol/L (ref 98–111)
Creatinine: 0.68 mg/dL (ref 0.44–1.00)
GFR, Est AFR Am: >60 mL/min (ref >60)
GFR, Estimated: >60 mL/min (ref >60)
Glucose, Bld: 96 mg/dL (ref 70–99)
Potassium: 3.7 mmol/L (ref 3.5–5.1)
Sodium: 138 mmol/L (ref 135–145)
Total Bilirubin: 0.4 mg/dL (ref 0.3–1.2)
Total Protein: 7.2 g/dL (ref 6.5–8.1)

## 2018-08-13 LAB — CBC WITH DIFFERENTIAL (CANCER CENTER ONLY)
Abs Immature Granulocytes: 0.02 10*3/uL (ref 0.00–0.07)
Basophils Absolute: 0 10*3/uL (ref 0.0–0.1)
Basophils Relative: 1 %
Eosinophils Absolute: 0 10*3/uL (ref 0.0–0.5)
Eosinophils Relative: 1 %
HCT: 30.8 % — ABNORMAL LOW (ref 36.0–46.0)
Hemoglobin: 10.2 g/dL — ABNORMAL LOW (ref 12.0–15.0)
Immature Granulocytes: 1 %
Lymphocytes Relative: 18 %
Lymphs Abs: 0.8 10*3/uL (ref 0.7–4.0)
MCH: 30.3 pg (ref 26.0–34.0)
MCHC: 33.1 g/dL (ref 30.0–36.0)
MCV: 91.4 fL (ref 80.0–100.0)
Monocytes Absolute: 0.5 10*3/uL (ref 0.1–1.0)
Monocytes Relative: 11 %
Neutro Abs: 3 10*3/uL (ref 1.7–7.7)
Neutrophils Relative %: 68 %
Platelet Count: 301 10*3/uL (ref 150–400)
RBC: 3.37 MIL/uL — ABNORMAL LOW (ref 3.87–5.11)
RDW: 15.9 % — ABNORMAL HIGH (ref 11.5–15.5)
WBC Count: 4.3 10*3/uL (ref 4.0–10.5)
nRBC: 0 % (ref 0.0–0.2)

## 2018-08-13 LAB — PREGNANCY, URINE: Preg Test, Ur: NEGATIVE

## 2018-08-13 MED ORDER — SODIUM CHLORIDE 0.9% FLUSH
10.0000 mL | INTRAVENOUS | Status: DC | PRN
  Administered 2018-08-13: 10 mL
  Filled 2018-08-13: qty 10

## 2018-08-13 MED ORDER — DIPHENHYDRAMINE HCL 50 MG/ML IJ SOLN
INTRAMUSCULAR | Status: AC
  Filled 2018-08-13: qty 1

## 2018-08-13 MED ORDER — HEPARIN SOD (PORK) LOCK FLUSH 100 UNIT/ML IV SOLN
500.0000 [IU] | Freq: Once | INTRAVENOUS | Status: AC | PRN
  Administered 2018-08-13: 17:00:00 500 [IU]
  Filled 2018-08-13: qty 5

## 2018-08-13 MED ORDER — DIPHENHYDRAMINE HCL 50 MG/ML IJ SOLN
50.0000 mg | Freq: Once | INTRAMUSCULAR | Status: AC
  Administered 2018-08-13: 15:00:00 50 mg via INTRAVENOUS

## 2018-08-13 MED ORDER — SODIUM CHLORIDE 0.9 % IV SOLN
Freq: Once | INTRAVENOUS | Status: AC
  Administered 2018-08-13: 15:00:00 via INTRAVENOUS
  Filled 2018-08-13: qty 250

## 2018-08-13 MED ORDER — SODIUM CHLORIDE 0.9 % IV SOLN
80.0000 mg/m2 | Freq: Once | INTRAVENOUS | Status: AC
  Administered 2018-08-13: 168 mg via INTRAVENOUS
  Filled 2018-08-13: qty 28

## 2018-08-13 MED ORDER — SODIUM CHLORIDE 0.9 % IV SOLN
10.0000 mg | Freq: Once | INTRAVENOUS | Status: AC
  Administered 2018-08-13: 10 mg via INTRAVENOUS
  Filled 2018-08-13: qty 1

## 2018-08-13 MED ORDER — FAMOTIDINE IN NACL 20-0.9 MG/50ML-% IV SOLN
INTRAVENOUS | Status: AC
  Filled 2018-08-13: qty 50

## 2018-08-13 MED ORDER — FAMOTIDINE IN NACL 20-0.9 MG/50ML-% IV SOLN
20.0000 mg | Freq: Once | INTRAVENOUS | Status: AC
  Administered 2018-08-13: 20 mg via INTRAVENOUS

## 2018-08-13 NOTE — Assessment & Plan Note (Addendum)
05/13/2018:Evaluation of 5 months of thickening of the left breast upper outer quadrant. 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 Several lymph nodes were identified by ultrasound. T2 N1 stage IIb clinical stage  Treatment plan: 1.Neoadjuvant chemotherapy with dose dense Adriamycin and Cytoxan followed by Taxol weekly x12 2.Breast conserving surgery with targeted node dissection if possible 3.Adjuvant radiation therapy 4.Followed by adjuvant antiestrogen therapy ---------------------------------------------------------------------- Current treatment: Completed 4 cycles ofdose dense Adriamycin and Cytoxan, today cycle 4 of Taxol Echocardiogram 05/23/2018: EF 60 to 65% Labs reviewed  Chemo toxicities: Denies any nausea or vomiting. Complete loss of hair Mild fatigue Mild anemia Monitoring closely for peripheral neuropathy--none at this point  Genetic testing is pending Return to clinic in2 weeks for cycle 6 of Taxol

## 2018-08-13 NOTE — Progress Notes (Signed)
[facility] Follow up:    Bethany Olp, MD [address] [place] [address]   DIAGNOSIS: Cancer Staging Malignant neoplasm of upper-outer quadrant of left breast in female, estrogen receptor positive ([place]) Staging form: Breast, AJCC 8th Edition - Clinical stage from 05/16/2018: Stage IIB (cT2, cN1, cM0, G3, ER+, PR+, HER2-) - Signed by [name], MD on 05/16/2018   SUMMARY OF ONCOLOGIC HISTORY: Oncology History  Malignant neoplasm of upper-outer quadrant of left breast in female, estrogen receptor positive ([place])  05/13/2018 Initial Diagnosis   Evaluation of 5 months of thickening of the left breast upper outer quadrant.  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  Several lymph nodes were identified by ultrasound.  T2 N1 stage IIb clinical stage   05/16/2018 Cancer Staging   Staging form: Breast, AJCC 8th Edition - Clinical stage from 05/16/2018: Stage IIB (cT2, cN1, cM0, G3, ER+, PR+, HER2-) - Signed by [name], MD on 05/16/2018   05/28/2018 -  Neo-Adjuvant Chemotherapy   Neoadjuvant chemotherapy with dose dense Adriamycin and Cytoxan followed by Taxol weekly x12     CURRENT THERAPY: Taxol  INTERVAL HISTORY: Bethany Hanna 38 y.o. female returns for evaluation prior to receiving her neoadjuvant chemotherapy with weekly taxol.  This is her fourth dose.  She underwent genetic testing evaluation by Roma Kayser on 08/01/2018 and consented for genetic testing.  Her results are pending.  Bethany Hanna is doing well.  She denies any peripheral neuropathy.  She is fatigued, but remaining active.     Patient Active Problem List   Diagnosis Date Noted  . Family history of colon cancer   . Family history of  melanoma   . Port-A-Cath in place 06/11/2018  . White coat syndrome without hypertension 05/22/2018  . Malignant neoplasm of upper-outer quadrant of left breast in female, estrogen receptor positive ([place]) 05/16/2018    has No Known Allergies.  MEDICAL HISTORY: Past Medical History:  Diagnosis Date  . Cancer ([place]) 05/05/2018   Breast caner  . Family history of colon cancer   . Family history of melanoma     SURGICAL HISTORY: Past Surgical History:  Procedure Laterality Date  . CHOLECYSTECTOMY N/A 04/09/2015   Procedure: LAPAROSCOPIC CHOLECYSTECTOMY WITH INTRAOPERATIVE CHOLANGIOGRAM;  Surgeon: Autumn Messing III, MD;  Location: [facility];  Service: General;  Laterality: N/A;  . DILATATION & CURRETTAGE/HYSTEROSCOPY WITH RESECTOCOPE N/A 07/31/2013   Procedure: [place];  Surgeon: Princess Bruins, MD;  Location: [facility];  Service: Gynecology;  Laterality: N/A;  1 hr.  Marland Kitchen DILATION AND EVACUATION N/A 12/12/2017   Procedure: DILATATION AND EVACUATION (D&E) 2ND TRIMESTER;  Surgeon: Brien Few, MD;  Location: [facility];  Service: Gynecology;  Laterality: N/A;  . OPERATIVE ULTRASOUND N/A 12/12/2017   Procedure: OPERATIVE ULTRASOUND;  Surgeon: Brien Few, MD;  Location: [facility];  Service: Gynecology;  Laterality: N/A;  . PORTACATH PLACEMENT Right 05/27/2018   Procedure: INSERTION PORT-A-CATH WITH ULTRASOUND;  Surgeon: [name], MD;  Location: [facility];  Service: General;  Laterality: Right;  GENERAL AND LMA    SOCIAL HISTORY: Social History   Socioeconomic History  . Marital status: Married    Spouse name: Not on file  . Number of children: Not on file  .  Years of education: Not on file  . Highest education level: Not on file  Occupational History  . Not on file  Social Needs  . Financial resource strain: Not on file  . Food insecurity    Worry: Not on file    Inability: Not on file  . Transportation needs    Medical:  Not on file    Non-medical: Not on file  Tobacco Use  . Smoking status: Never Smoker  . Smokeless tobacco: Never Used  Substance and Sexual Activity  . Alcohol use: Yes    Alcohol/week: 2.0 - 3.0 standard drinks    Types: 2 - 3 Glasses of wine per week  . Drug use: No  . Sexual activity: Yes  Lifestyle  . Physical activity    Days per week: Not on file    Minutes per session: Not on file  . Stress: Not on file  Relationships  . Social Herbalist on phone: Not on file    Gets together: Not on file    Attends religious service: Not on file    Active member of club or organization: Not on file    Attends meetings of clubs or organizations: Not on file    Relationship status: Not on file  . Intimate partner violence    Fear of current or ex partner: Not on file    Emotionally abused: Not on file    Physically abused: Not on file    Forced sexual activity: Not on file  Other Topics Concern  . Not on file  Social History Narrative   Married- see husband Catalina [place]- have 45 and 35 year old in 2020.       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 Arthritis Mother   . Migraines Mother   . ADD / ADHD Mother   . Varicose Veins Mother   . Hypertension Father   . Alcohol abuse Brother   . Varicose Veins Brother   . Stroke Maternal Grandmother   . Melanoma Maternal Grandmother   . Pulmonary fibrosis Maternal Grandfather   . Mitral valve prolapse Paternal Grandmother   . Migraines Paternal Grandmother   . Lymphoma Paternal Grandfather        was treated for this and did ok  . Heart failure Paternal Grandfather   . Eczema Son   . Colon cancer Maternal Aunt 53    Review of Systems  Constitutional: Positive for fatigue. Negative for appetite change, chills, fever and unexpected weight  change.  HENT:   Negative for hearing loss, lump/mass, mouth sores, sore throat and trouble swallowing.   Eyes: Negative for eye problems and icterus.  Respiratory: Negative for chest tightness, cough and shortness of breath.   Cardiovascular: Negative for chest pain, leg swelling and palpitations.  Gastrointestinal: Negative for abdominal distention, abdominal pain, constipation, diarrhea, nausea and vomiting.  Endocrine: Negative for hot flashes.  Musculoskeletal: Negative for arthralgias.  Skin: Negative for itching and rash.  Neurological: Negative for dizziness, extremity weakness, headaches and numbness.  Hematological: Negative for adenopathy.  Psychiatric/Behavioral: Negative for depression. The patient is not nervous/anxious.       PHYSICAL EXAMINATION  ECOG PERFORMANCE STATUS: 1 - Symptomatic but completely ambulatory  Vitals:   08/13/18 1351  BP: (!) 149/86  Pulse: (!) 103  Resp: 20  Temp: 99.1 F (37.3 C)  SpO2: 100%    Physical Exam Constitutional:      General: She is not in acute distress.    Appearance: Normal appearance.  HENT:     Head: Normocephalic and atraumatic.     Mouth/Throat:     Mouth: Mucous membranes are moist.     Pharynx: Oropharynx is clear. No oropharyngeal exudate or posterior oropharyngeal erythema.  Eyes:     General: No scleral icterus.    Pupils: Pupils are equal, round, and reactive to light.  Neck:     Musculoskeletal: Neck supple.  Cardiovascular:     Rate and Rhythm: Normal rate and regular rhythm.     Pulses: Normal pulses.     Heart sounds: Normal heart sounds.  Pulmonary:     Effort: Pulmonary effort is normal.     Breath sounds: Normal breath sounds.  Abdominal:     General: Abdomen is flat. Bowel sounds are normal. There is no distension.     Palpations: Abdomen is soft.     Tenderness: There is no abdominal tenderness.  Musculoskeletal:        General: No swelling.  Lymphadenopathy:     Cervical: No cervical  adenopathy.  Skin:    General: Skin is warm and dry.     Capillary Refill: Capillary refill takes less than 2 seconds.     Findings: No rash.  Neurological:     General: No focal deficit present.     Mental Status: She is alert.  Psychiatric:        Mood and Affect: Mood normal.        Behavior: Behavior normal.     LABORATORY DATA:  CBC    Component Value Date/Time   WBC 4.3 08/13/2018 1235   WBC 15.9 (H) 12/12/2017 1215   RBC 3.37 (L) 08/13/2018 1235   HGB 10.2 (L) 08/13/2018 1235   HCT 30.8 (L) 08/13/2018 1235   PLT 301 08/13/2018 1235   MCV 91.4 08/13/2018 1235   MCV 91.6 01/19/2012 1716   MCH 30.3 08/13/2018 1235   MCHC 33.1 08/13/2018 1235   RDW 15.9 (H) 08/13/2018 1235   LYMPHSABS 0.8 08/13/2018 1235   MONOABS 0.5 08/13/2018 1235   EOSABS 0.0 08/13/2018 1235   BASOSABS 0.0 08/13/2018 1235    CMP     Component Value Date/Time   NA 138 08/13/2018 1235   K 3.7 08/13/2018 1235   CL 105 08/13/2018 1235   CO2 23 08/13/2018 1235   GLUCOSE 96 08/13/2018 1235   BUN 8 08/13/2018 1235   CREATININE 0.68 08/13/2018 1235   CALCIUM 9.2 08/13/2018 1235   PROT 7.2 08/13/2018 1235   ALBUMIN 4.1 08/13/2018 1235   AST 23 08/13/2018 1235   ALT 39 08/13/2018 1235   ALKPHOS 68 08/13/2018 1235   BILITOT 0.4 08/13/2018 1235   GFRNONAA >60 08/13/2018 1235   GFRAA >60 08/13/2018 1235     ASSESSMENT and PLAN:   Malignant neoplasm of upper-outer quadrant of left breast in female, estrogen receptor positive ([place]) 05/13/2018:Evaluation of 5 months of thickening of the left breast upper outer quadrant. 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 Several lymph nodes were identified by ultrasound. T2 N1 stage IIb  clinical  stage  Treatment plan: 1.Neoadjuvant chemotherapy with dose dense Adriamycin and Cytoxan followed by Taxol weekly x12 2.Breast conserving surgery with targeted node dissection if possible 3.Adjuvant radiation therapy 4.Followed by adjuvant antiestrogen therapy ---------------------------------------------------------------------- Current treatment: Completed 4 cycles ofdose dense Adriamycin and Cytoxan, today cycle 4 of Taxol Echocardiogram 05/23/2018: EF 60 to 65% Labs reviewed  Chemo toxicities: Denies any nausea or vomiting. Complete loss of hair Mild fatigue Mild anemia Monitoring closely for peripheral neuropathy--none at this point  Genetic testing is pending Return to clinic in2 weeks for cycle 6 of Taxol   All questions were answered. The patient knows to call the clinic with any problems, questions or concerns. We can certainly see the patient much sooner if necessary.  A total of (20) minutes of face-to-face time was spent with this patient with greater than 50% of that time in counseling and care-coordination.  This note was electronically signed. Scot Dock, NP 08/15/2018

## 2018-08-13 NOTE — Telephone Encounter (Signed)
Spoke to pt prior to Taxol treatment to assess needs during chemo. Relate doing well and denies any questions or concerns. Encourage pt to call with needs. Received verbal understanding.

## 2018-08-20 ENCOUNTER — Other Ambulatory Visit: Payer: Self-pay

## 2018-08-20 ENCOUNTER — Inpatient Hospital Stay: Payer: 59

## 2018-08-20 VITALS — BP 129/93 | HR 99 | Temp 98.9°F | Resp 18

## 2018-08-20 DIAGNOSIS — C50412 Malignant neoplasm of upper-outer quadrant of left female breast: Secondary | ICD-10-CM

## 2018-08-20 DIAGNOSIS — Z170 Estrogen receptor positive status [ER+]: Secondary | ICD-10-CM

## 2018-08-20 DIAGNOSIS — Z95828 Presence of other vascular implants and grafts: Secondary | ICD-10-CM

## 2018-08-20 DIAGNOSIS — Z5111 Encounter for antineoplastic chemotherapy: Secondary | ICD-10-CM | POA: Diagnosis not present

## 2018-08-20 LAB — CBC WITH DIFFERENTIAL (CANCER CENTER ONLY)
Abs Immature Granulocytes: 0.03 10*3/uL (ref 0.00–0.07)
Basophils Absolute: 0 10*3/uL (ref 0.0–0.1)
Basophils Relative: 1 %
Eosinophils Absolute: 0.1 10*3/uL (ref 0.0–0.5)
Eosinophils Relative: 2 %
HCT: 31.3 % — ABNORMAL LOW (ref 36.0–46.0)
Hemoglobin: 10.2 g/dL — ABNORMAL LOW (ref 12.0–15.0)
Immature Granulocytes: 1 %
Lymphocytes Relative: 24 %
Lymphs Abs: 1.2 10*3/uL (ref 0.7–4.0)
MCH: 29.9 pg (ref 26.0–34.0)
MCHC: 32.6 g/dL (ref 30.0–36.0)
MCV: 91.8 fL (ref 80.0–100.0)
Monocytes Absolute: 0.4 10*3/uL (ref 0.1–1.0)
Monocytes Relative: 7 %
Neutro Abs: 3.4 10*3/uL (ref 1.7–7.7)
Neutrophils Relative %: 65 %
Platelet Count: 348 10*3/uL (ref 150–400)
RBC: 3.41 MIL/uL — ABNORMAL LOW (ref 3.87–5.11)
RDW: 15 % (ref 11.5–15.5)
WBC Count: 5.1 10*3/uL (ref 4.0–10.5)
nRBC: 0 % (ref 0.0–0.2)

## 2018-08-20 LAB — CMP (CANCER CENTER ONLY)
ALT: 34 U/L (ref 0–44)
AST: 21 U/L (ref 15–41)
Albumin: 4.2 g/dL (ref 3.5–5.0)
Alkaline Phosphatase: 65 U/L (ref 38–126)
Anion gap: 10 (ref 5–15)
BUN: 7 mg/dL (ref 6–20)
CO2: 24 mmol/L (ref 22–32)
Calcium: 9.3 mg/dL (ref 8.9–10.3)
Chloride: 105 mmol/L (ref 98–111)
Creatinine: 0.77 mg/dL (ref 0.44–1.00)
GFR, Est AFR Am: >60 mL/min (ref >60)
GFR, Estimated: >60 mL/min (ref >60)
Glucose, Bld: 97 mg/dL (ref 70–99)
Potassium: 3.7 mmol/L (ref 3.5–5.1)
Sodium: 139 mmol/L (ref 135–145)
Total Bilirubin: 0.4 mg/dL (ref 0.3–1.2)
Total Protein: 7.4 g/dL (ref 6.5–8.1)

## 2018-08-20 MED ORDER — SODIUM CHLORIDE 0.9% FLUSH
10.0000 mL | INTRAVENOUS | Status: DC | PRN
  Administered 2018-08-20: 10 mL
  Filled 2018-08-20: qty 10

## 2018-08-20 MED ORDER — DIPHENHYDRAMINE HCL 50 MG/ML IJ SOLN
50.0000 mg | Freq: Once | INTRAMUSCULAR | Status: AC
  Administered 2018-08-20: 50 mg via INTRAVENOUS

## 2018-08-20 MED ORDER — SODIUM CHLORIDE 0.9 % IV SOLN
Freq: Once | INTRAVENOUS | Status: AC
  Administered 2018-08-20: 14:00:00 via INTRAVENOUS
  Filled 2018-08-20: qty 250

## 2018-08-20 MED ORDER — SODIUM CHLORIDE 0.9 % IV SOLN
80.0000 mg/m2 | Freq: Once | INTRAVENOUS | Status: AC
  Administered 2018-08-20: 168 mg via INTRAVENOUS
  Filled 2018-08-20: qty 28

## 2018-08-20 MED ORDER — GOSERELIN ACETATE 3.6 MG ~~LOC~~ IMPL
DRUG_IMPLANT | SUBCUTANEOUS | Status: AC
  Filled 2018-08-20: qty 3.6

## 2018-08-20 MED ORDER — DIPHENHYDRAMINE HCL 50 MG/ML IJ SOLN
INTRAMUSCULAR | Status: AC
  Filled 2018-08-20: qty 1

## 2018-08-20 MED ORDER — FAMOTIDINE IN NACL 20-0.9 MG/50ML-% IV SOLN
INTRAVENOUS | Status: AC
  Filled 2018-08-20: qty 50

## 2018-08-20 MED ORDER — SODIUM CHLORIDE 0.9 % IV SOLN
10.0000 mg | Freq: Once | INTRAVENOUS | Status: AC
  Administered 2018-08-20: 10 mg via INTRAVENOUS
  Filled 2018-08-20: qty 1

## 2018-08-20 MED ORDER — FAMOTIDINE IN NACL 20-0.9 MG/50ML-% IV SOLN
20.0000 mg | Freq: Once | INTRAVENOUS | Status: AC
  Administered 2018-08-20: 20 mg via INTRAVENOUS

## 2018-08-20 MED ORDER — HEPARIN SOD (PORK) LOCK FLUSH 100 UNIT/ML IV SOLN
500.0000 [IU] | Freq: Once | INTRAVENOUS | Status: AC | PRN
  Administered 2018-08-20: 500 [IU]
  Filled 2018-08-20: qty 5

## 2018-08-20 MED ORDER — GOSERELIN ACETATE 3.6 MG ~~LOC~~ IMPL
3.6000 mg | DRUG_IMPLANT | Freq: Once | SUBCUTANEOUS | Status: AC
  Administered 2018-08-20: 3.6 mg via SUBCUTANEOUS

## 2018-08-20 NOTE — Patient Instructions (Signed)
Coronavirus (COVID-19) Are you at risk?  Are you at risk for the Coronavirus (COVID-19)?  To be considered HIGH RISK for Coronavirus (COVID-19), you have to meet the following criteria:  . Traveled to [place], [place], [place], [place] or [place]; or in the [place] to [place], [place], [place], or [place]; and have fever, cough, and shortness of breath within the last 2 weeks of travel OR . Been in close contact with a person diagnosed with COVID-19 within the last 2 weeks and have fever, cough, and shortness of breath . IF YOU DO NOT MEET THESE CRITERIA, YOU ARE CONSIDERED LOW RISK FOR COVID-19.  What to do if you are HIGH RISK for COVID-19?  . If you are having a medical emergency, call 911. . Seek medical care right away. Before you go to a doctor's office, urgent care or emergency department, call ahead and tell them about your recent travel, contact with someone diagnosed with COVID-19, and your symptoms. You should receive instructions from your physician's office regarding next steps of care.  . When you arrive at healthcare provider, tell the healthcare staff immediately you have returned from visiting [place], [place], [place], [place] or [place]; or traveled in the [place] to [place], [place], [place], or [place]; in the last two weeks or you have been in close contact with a person diagnosed with COVID-19 in the last 2 weeks.   . Tell the health care staff about your symptoms: fever, cough and shortness of breath. . After you have been seen by a medical provider, you will be either: o Tested for (COVID-19) and discharged home on quarantine except to seek medical care if symptoms worsen, and asked to  - Stay home and avoid contact with others until you get your results (4-5 days)  - Avoid travel on public transportation if possible (such as bus, train, or airplane) or o Sent to the Emergency Department by EMS for evaluation, COVID-19 testing, and possible  admission depending on your condition and test results.  What to do if you are LOW RISK for COVID-19?  Reduce your risk of any infection by using the same precautions used for avoiding the common cold or flu:  . Wash your hands often with soap and warm water for at least 20 seconds.  If soap and water are not readily available, use an alcohol-based hand sanitizer with at least 60% alcohol.  . If coughing or sneezing, cover your mouth and nose by coughing or sneezing into the elbow areas of your shirt or coat, into a tissue or into your sleeve (not your hands). . Avoid shaking hands with others and consider head nods or verbal greetings only. . Avoid touching your eyes, nose, or mouth with unwashed hands.  . Avoid close contact with people who are sick. . Avoid places or events with large numbers of people in one location, like concerts or sporting events. . Carefully consider travel plans you have or are making. . If you are planning any travel outside or inside the [place], visit the CDC's Travelers' Health webpage for the latest health notices. . If you have some symptoms but not all symptoms, continue to monitor at home and seek medical attention if your symptoms worsen. . If you are having a medical emergency, call 911.   ADDITIONAL HEALTHCARE OPTIONS FOR PATIENTS  [facility] / e-Visit: [email or web address]         [facility]: [phone]  [facility]: [phone]                   [facility]: [phone]   [facility] Discharge Instructions for Patients Receiving Chemotherapy  Today you received the following chemotherapy agents: Paclitaxel (Taxol)  To help prevent nausea and vomiting after your treatment, we encourage you to take your nausea medication as directed.    If you develop nausea and vomiting that is not controlled by your nausea medication, call the clinic.    BELOW ARE SYMPTOMS THAT SHOULD BE REPORTED IMMEDIATELY:  *FEVER GREATER THAN 100.5 F  *CHILLS WITH OR WITHOUT FEVER  NAUSEA AND VOMITING THAT IS NOT CONTROLLED WITH YOUR NAUSEA MEDICATION  *UNUSUAL SHORTNESS OF BREATH  *UNUSUAL BRUISING OR BLEEDING  TENDERNESS IN MOUTH AND THROAT WITH OR WITHOUT PRESENCE OF ULCERS  *URINARY PROBLEMS  *BOWEL PROBLEMS  UNUSUAL RASH Items with * indicate a potential emergency and should be followed up as soon as possible.  Feel free to call the clinic should you have any questions or concerns. The clinic phone number is [phone].  Please show the CHEMO ALERT CARD at check-in to the Emergency Department and triage nurse.   

## 2018-08-21 ENCOUNTER — Encounter: Payer: Self-pay | Admitting: Genetic Counselor

## 2018-08-21 ENCOUNTER — Ambulatory Visit: Payer: Self-pay | Admitting: Genetic Counselor

## 2018-08-21 ENCOUNTER — Telehealth: Payer: Self-pay | Admitting: Genetic Counselor

## 2018-08-21 DIAGNOSIS — Z1379 Encounter for other screening for genetic and chromosomal anomalies: Secondary | ICD-10-CM

## 2018-08-21 NOTE — Progress Notes (Signed)
HPI:  Bethany Hanna was previously seen in the [place] clinic due to a personal and family history of cancer and concerns regarding a hereditary predisposition to cancer. Please refer to our prior cancer genetics clinic note for more information regarding our discussion, assessment and recommendations, at the time. Bethany Hanna recent genetic test results were disclosed to her, as were recommendations warranted by these results. These results and recommendations are discussed in more detail below.  CANCER HISTORY:  Oncology History  Malignant neoplasm of upper-outer quadrant of left breast in female, estrogen receptor positive ([place])  05/13/2018 Initial Diagnosis   Evaluation of 5 months of thickening of the left breast upper outer quadrant.  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  Several lymph nodes were identified by ultrasound.  T2 N1 stage IIb clinical stage   05/16/2018 Cancer Staging   Staging form: Breast, AJCC 8th Edition - Clinical stage from 05/16/2018: Stage IIB (cT2, cN1, cM0, G3, ER+, PR+, HER2-) - Signed by [name], MD on 05/16/2018   05/28/2018 -  Neo-Adjuvant Chemotherapy   Neoadjuvant chemotherapy with dose dense Adriamycin and Cytoxan followed by Taxol weekly x12   08/16/2018 Genetic Testing   Negative genetic testing on the multi-cancer panel.  The Multi-Gene Panel offered by [facility] includes sequencing and/or deletion duplication testing of the following 85 genes: AIP, ALK, APC, ATM, AXIN2,BAP1,  BARD1, BLM, BMPR1A, BRCA1, BRCA2, BRIP1, CASR, CDC73, CDH1, CDK4, CDKN1B, CDKN1C, CDKN2A (p14ARF), CDKN2A (p16INK4a), CEBPA, CHEK2, CTNNA1, DICER1, DIS3L2, EGFR (c.2369C>T, p.Thr790Met variant only), EPCAM  (Deletion/duplication testing only), FH, FLCN, GATA2, GPC3, GREM1 (Promoter region deletion/duplication testing only), HOXB13 (c.251G>A, p.Gly84Glu), HRAS, KIT, MAX, MEN1, MET, MITF (c.952G>A, p.Glu318Lys variant only), MLH1, MSH2, MSH3, MSH6, MUTYH, NBN, NF1, NF2, NTHL1, PALB2, PDGFRA, PHOX2B, PMS2, POLD1, POLE, POT1, PRKAR1A, PTCH1, PTEN, RAD50, RAD51C, RAD51D, RB1, RECQL4, RET, RNF43, RUNX1, SDHAF2, SDHA (sequence changes only), SDHB, SDHC, SDHD, SMAD4, SMARCA4, SMARCB1, SMARCE1, STK11, SUFU, TERC, TERT, TMEM127, TP53, TSC1, TSC2, VHL, WRN and WT1.  The report date is August 16, 2018.     FAMILY HISTORY:  We obtained a detailed, 4-generation family history.  Significant diagnoses are listed below: Family History  Problem Relation Age of Onset  . Arthritis Mother   . Migraines Mother   . ADD / ADHD Mother   . Varicose Veins Mother   . Hypertension Father   . Alcohol abuse Brother   . Varicose Veins Brother   . Stroke Maternal Grandmother   . Melanoma Maternal Grandmother   . Pulmonary fibrosis Maternal Grandfather   . Mitral valve prolapse Paternal Grandmother   . Migraines Paternal Grandmother   . Lymphoma Paternal Grandfather        was treated for this and did ok  . Heart failure Paternal Grandfather   . Eczema Son   . Colon cancer Maternal Aunt 48    The patient has two sons who are cancer free.  She also had a stillborn son in October 2019.  She has one brother who is cancer free.  Both parents are living.  The patient's mother is healthy and cancer free.  She has one sister who has colon cancer at 3.  The maternal grandmother had melanoma in her 53's, and the grandfather died of non cancer related issues.  The patient's father is healthy and cancer free.  He has two brothers who are cancer free.  Both paternal grandparents are deceased.  Bethany Hanna is unaware of previous family history of genetic testing for hereditary cancer risks. Patient's maternal ancestors are of [place]  descent, and paternal ancestors are of [place] and [place] descent. There is no reported Ashkenazi Jewish ancestry. There is no known consanguinity.   GENETIC TEST RESULTS: Genetic testing reported out on August 16, 2018 through the multi-cancer panel found no pathogenic mutations. The Multi-Gene Panel offered by [facility] includes sequencing and/or deletion duplication testing of the following 85 genes: AIP, ALK, APC, ATM, AXIN2,BAP1,  BARD1, BLM, BMPR1A, BRCA1, BRCA2, BRIP1, CASR, CDC73, CDH1, CDK4, CDKN1B, CDKN1C, CDKN2A (p14ARF), CDKN2A (p16INK4a), CEBPA, CHEK2, CTNNA1, DICER1, DIS3L2, EGFR (c.2369C>T, p.Thr790Met variant only), EPCAM (Deletion/duplication testing only), FH, FLCN, GATA2, GPC3, GREM1 (Promoter region deletion/duplication testing only), HOXB13 (c.251G>A, p.Gly84Glu), HRAS, KIT, MAX, MEN1, MET, MITF (c.952G>A, p.Glu318Lys variant only), MLH1, MSH2, MSH3, MSH6, MUTYH, NBN, NF1, NF2, NTHL1, PALB2, PDGFRA, PHOX2B, PMS2, POLD1, POLE, POT1, PRKAR1A, PTCH1, PTEN, RAD50, RAD51C, RAD51D, RB1, RECQL4, RET, RNF43, RUNX1, SDHAF2, SDHA (sequence changes only), SDHB, SDHC, SDHD, SMAD4, SMARCA4, SMARCB1, SMARCE1, STK11, SUFU, TERC, TERT, TMEM127, TP53, TSC1, TSC2, VHL, WRN and WT1.  The test report has been scanned into EPIC and is located under the Molecular Pathology section of the Results Review tab.  A portion of the result report is included below for reference.     We discussed with Bethany Hanna that because current genetic testing is not perfect, it is possible there may be a gene mutation in one of these genes that current testing cannot detect, but that chance is small.  We also discussed, that there could be another gene that has not yet been discovered, or that we have not yet tested, that is responsible for the cancer diagnoses in the family. It is also possible there is a hereditary cause for the cancer in the family that Bethany Hanna did not inherit and therefore was not identified in her testing.   Therefore, it is important to remain in touch with cancer genetics in the future so that we can continue to offer Bethany Hanna the most up to date genetic testing.   ADDITIONAL GENETIC TESTING: We discussed with Bethany Hanna that her genetic testing was fairly extensive.  If there are genes identified to increase cancer risk that can be analyzed in the future, we would be happy to discuss and coordinate this testing at that time.    CANCER SCREENING RECOMMENDATIONS: Bethany Hanna test result is considered negative (normal).  This means that we have not identified a hereditary cause for her personal and family history of cancer at this time. Most cancers happen by chance and this negative test suggests that her cancer may fall into this category.    While reassuring, this does not definitively rule out a hereditary predisposition to cancer. It is still possible that there could be genetic mutations that are undetectable by current technology. There could be genetic mutations in genes that have not been tested or identified to increase cancer risk.  Therefore, it is recommended she continue to follow the cancer management and screening guidelines provided by her oncology and primary healthcare provider.   An individual's cancer risk and medical management are not determined by genetic test results alone. Overall cancer risk assessment incorporates additional factors, including personal medical history, family history, and any available genetic information that may result in a personalized  plan for cancer prevention and surveillance  RECOMMENDATIONS FOR FAMILY MEMBERS:  Individuals in this family might be at some increased risk of developing cancer, over the general population risk, simply due to the family history of cancer.  We recommended women in this family have a yearly mammogram beginning at age 56, or 79 years younger than the earliest onset of cancer, an annual clinical breast exam, and perform monthly breast  self-exams. Women in this family should also have a gynecological exam as recommended by their primary provider. All family members should have a colonoscopy by age 57.  FOLLOW-UP: Lastly, we discussed with Bethany Hanna that cancer genetics is a rapidly advancing field and it is possible that new genetic tests will be appropriate for her and/or her family members in the future. We encouraged her to remain in contact with cancer genetics on an annual basis so we can update her personal and family histories and let her know of advances in cancer genetics that may benefit this family.   Our contact number was provided. Bethany Hanna questions were answered to her satisfaction, and she knows she is welcome to call us at anytime with additional questions or concerns.   Roma Kayser, MS, [facility] Certified Genetic Counselor [email or web address]

## 2018-08-21 NOTE — Assessment & Plan Note (Signed)
05/13/2018:Evaluation of 5 months of thickening of the left breast upper outer quadrant. 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 Several lymph nodes were identified by ultrasound. T2 N1 stage IIb clinical stage  Treatment plan: 1.Neoadjuvant chemotherapy with dose dense Adriamycin and Cytoxan followed by Taxol weekly x12 2.Breast conserving surgery with targeted node dissection if possible 3.Adjuvant radiation therapy 4.Followed by adjuvant antiestrogen therapy ---------------------------------------------------------------------- Current treatment:Completed 4 cycles ofdose dense Adriamycin and Cytoxan, today cycle 6 of Taxol Echocardiogram 05/23/2018: EF 60 to 65% Labs reviewed  Chemo toxicities: Denies any nausea or vomiting. Complete loss of hair Mild fatigue Mild anemia Monitoring closely for peripheral neuropathy--none at this point  Genetic testing is pending Return to clinic in2 weeks for cycle 8 of Taxol

## 2018-08-21 NOTE — Telephone Encounter (Signed)
Revealed negative genetic testing.  Discussed that we do not know why she has breast cancer or why there is cancer in the family. It could be due to a different gene that we are not testing, or maybe our current technology may not be able to pick something up.  It will be important for her to keep in contact with genetics to keep up with whether additional testing may be needed. 

## 2018-08-26 NOTE — Progress Notes (Signed)
° °Patient Care Team: °Hunter, Stephen O, MD as PCP - General (Family Medicine) ° °DIAGNOSIS:  °  ICD-10-CM   °1. Malignant neoplasm of upper-outer quadrant of left breast in female, estrogen receptor positive ([facility])  C50.412   ° Z17.0   ° ° °SUMMARY OF ONCOLOGIC HISTORY: °Oncology History  °Malignant neoplasm of upper-outer quadrant of left breast in female, estrogen receptor positive ([facility])  °05/13/2018 Initial Diagnosis  ° Evaluation of 5 months of thickening of the left breast upper outer quadrant.  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  Several lymph nodes were identified by ultrasound.  T2 N1 stage IIb clinical stage °  °05/16/2018 Cancer Staging  ° Staging form: Breast, AJCC 8th Edition °- Clinical stage from 05/16/2018: Stage IIB (cT2, cN1, cM0, G3, ER+, PR+, HER2-) - Signed by , , MD on 05/16/2018 °  °05/28/2018 -  Neo-Adjuvant Chemotherapy  ° Neoadjuvant chemotherapy with dose dense Adriamycin and Cytoxan followed by Taxol weekly x12 °  °08/16/2018 Genetic Testing  ° Negative genetic testing on the multi-cancer panel.  The Multi-Gene Panel offered by [facility] includes sequencing and/or deletion duplication testing of the following 85 genes: AIP, ALK, APC, ATM, AXIN2,BAP1,  BARD1, BLM, BMPR1A, BRCA1, BRCA2, BRIP1, CASR, CDC73, CDH1, CDK4, CDKN1B, CDKN1C, CDKN2A (p14ARF), CDKN2A (p16INK4a), CEBPA, CHEK2, CTNNA1, DICER1, DIS3L2, EGFR (c.2369C>T, p.Thr790Met variant only), EPCAM (Deletion/duplication testing only), FH, FLCN, GATA2, GPC3, GREM1 (Promoter region deletion/duplication testing only), HOXB13 (c.251G>A, p.Gly84Glu), HRAS, KIT, MAX, MEN1, MET, MITF (c.952G>A, p.Glu318Lys variant only), MLH1, MSH2, MSH3, MSH6, MUTYH, NBN, NF1, NF2, NTHL1, PALB2,  PDGFRA, PHOX2B, PMS2, POLD1, POLE, POT1, PRKAR1A, PTCH1, PTEN, RAD50, RAD51C, RAD51D, RB1, RECQL4, RET, RNF43, RUNX1, SDHAF2, SDHA (sequence changes only), SDHB, SDHC, SDHD, SMAD4, SMARCA4, SMARCB1, SMARCE1, STK11, SUFU, TERC, TERT, TMEM127, TP53, TSC1, TSC2, VHL, WRN and WT1.  The report date is August 16, 2018. °  ° ° °CHIEF COMPLIANT: Cycle 6 Taxol ° °INTERVAL HISTORY: Bethany Hanna is a 38 y.o. with above-mentioned history of left breast cancer who completed 4 cycles of neoadjuvant chemotherapy with dose dense Adriamycin and Cytoxan and is currently receiving weekly Taxol treatments. Genetic testing was negative. She presents to the clinic today for cycle 6. ° °REVIEW OF SYSTEMS:   °Constitutional: Denies fevers, chills or abnormal weight loss °Eyes: Denies blurriness of vision °Ears, nose, mouth, throat, and face: Denies mucositis or sore throat °Respiratory: Denies cough, dyspnea or wheezes °Cardiovascular: Denies palpitation, chest discomfort °Gastrointestinal: Denies nausea, heartburn or change in bowel habits °Skin: Denies abnormal skin rashes °Lymphatics: Denies new lymphadenopathy or easy bruising °Neurological: Denies numbness, tingling or new weaknesses °Behavioral/Psych: Mood is stable, no new changes  °Extremities: No lower extremity edema °Breast: denies any pain or lumps or nodules in either breasts °All other systems were reviewed with the patient and are negative. ° °I have reviewed the past medical history, past surgical history, social history and family history with the patient and they are unchanged from previous note. ° °ALLERGIES:  has No Known Allergies. ° °MEDICATIONS:  °Current Outpatient Medications  °Medication Sig Dispense Refill  °• acetaminophen (TYLENOL) 500 MG tablet Take 1,000 mg by mouth every 6 (six) hours as needed for moderate pain.     °• ALPRAZolam (XANAX) 0.5 MG tablet Take 1 tablet (0.5 mg total) by mouth at bedtime as needed   for anxiety. 60 tablet 3  °• dexamethasone  (DECADRON) 4 MG tablet Take 1 tablet day after chemo and 1 tablet 2 days after chemo with food 8 tablet 0  °• folic acid (FOLVITE) 1 MG tablet Take 1 mg by mouth daily.    °• lidocaine-prilocaine (EMLA) cream Apply to affected area once 30 g 3  °• LORazepam (ATIVAN) 0.5 MG tablet Take 1 tablet (0.5 mg total) by mouth at bedtime as needed (Nausea or vomiting). 30 tablet 1  °• mupirocin ointment (BACTROBAN) 2 % Apply to area 3 times daily until healed 30 g 0  °• ondansetron (ZOFRAN) 8 MG tablet Take 1 tablet (8 mg total) by mouth 2 (two) times daily as needed. Start on the third day after chemotherapy. 30 tablet 1  °• prochlorperazine (COMPAZINE) 10 MG tablet Take 1 tablet (10 mg total) by mouth every 6 (six) hours as needed (Nausea or vomiting). 30 tablet 1  °• traMADol (ULTRAM) 50 MG tablet Take 50 mg by mouth every 6 (six) hours as needed for moderate pain.    ° °No current facility-administered medications for this visit.   ° ° °PHYSICAL EXAMINATION: °ECOG PERFORMANCE STATUS: 1 - Symptomatic but completely ambulatory ° °Vitals:  ° 08/27/18 1151  °BP: 138/90  °Pulse: 97  °Resp: 18  °Temp: 98.9 °F (37.2 °C)  °SpO2: 100%  ° °Filed Weights  ° 08/27/18 1151  °Weight: 205 lb 11.2 oz (93.3 kg)  ° ° °Exam not done due to COVID-19 precautions ° °LABORATORY DATA:  °I have reviewed the data as listed °CMP Latest Ref Rng & Units 08/20/2018 08/13/2018 08/06/2018  °Glucose 70 - 99 mg/dL 97 96 94  °BUN 6 - 20 mg/dL 7 8 7  °Creatinine 0.44 - 1.00 mg/dL 0.77 0.68 0.69  °Sodium 135 - 145 mmol/L 139 138 138  °Potassium 3.5 - 5.1 mmol/L 3.7 3.7 3.7  °Chloride 98 - 111 mmol/L 105 105 105  °CO2 22 - 32 mmol/L 24 23 26  °Calcium 8.9 - 10.3 mg/dL 9.3 9.2 9.3  °Total Protein 6.5 - 8.1 g/dL 7.4 7.2 7.1  °Total Bilirubin 0.3 - 1.2 mg/dL 0.4 0.4 0.4  °Alkaline Phos 38 - 126 U/L 65 68 76  °AST 15 - 41 U/L 21 23 25  °ALT 0 - 44 U/L 34 39 38  ° ° °Lab Results  °Component Value Date  ° WBC 6.8 08/27/2018  ° HGB 10.1 (L) 08/27/2018  ° HCT 31.6 (L)  08/27/2018  ° MCV 94.9 08/27/2018  ° PLT 351 08/27/2018  ° NEUTROABS 5.2 08/27/2018  ° ° °ASSESSMENT & PLAN:  °Malignant neoplasm of upper-outer quadrant of left breast in female, estrogen receptor positive ([facility]) °05/13/2018:Evaluation of 5 months of thickening of the left breast upper outer quadrant.  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  Several lymph nodes were identified by ultrasound.  T2 N1 stage IIb clinical stage °  °Treatment plan: °1.  Neoadjuvant chemotherapy with dose dense Adriamycin and Cytoxan followed by Taxol weekly x12 °2.  Breast conserving surgery with targeted node dissection if possible °3.  Adjuvant radiation therapy °4.  Followed by adjuvant antiestrogen therapy (complete estrogen blockade with Zoladex plus aromatase inhibitor versus oophorectomy) °---------------------------------------------------------------------- °Current treatment: Completed 4 cycles of dose dense Adriamycin and Cytoxan, today cycle 6 of Taxol °Echocardiogram 05/23/2018: EF 60 to 65% °Labs reviewed °  °Chemo toxicities: °  toxicities: Denies any nausea or vomiting. Complete loss of hair Mild fatigue Mild anemia Monitoring closely for peripheral neuropathy--none at this point  Genetic testing is negative Return to clinic in2 weeks for cycle 8 of Taxol   No orders of the defined types were placed in this encounter.  The patient has a good understanding of the overall plan. she agrees with it. she will call with any problems that may develop before the next visit here.  [name], MD 08/27/2018  Julious Oka Dorshimer am acting as scribe for Dr. [name].  I have reviewed the above documentation for accuracy and completeness, and I agree with the above.

## 2018-08-27 ENCOUNTER — Other Ambulatory Visit: Payer: Self-pay

## 2018-08-27 ENCOUNTER — Inpatient Hospital Stay: Payer: 59

## 2018-08-27 ENCOUNTER — Inpatient Hospital Stay (HOSPITAL_BASED_OUTPATIENT_CLINIC_OR_DEPARTMENT_OTHER): Payer: 59 | Admitting: Hematology and Oncology

## 2018-08-27 DIAGNOSIS — C50412 Malignant neoplasm of upper-outer quadrant of left female breast: Secondary | ICD-10-CM

## 2018-08-27 DIAGNOSIS — Z79899 Other long term (current) drug therapy: Secondary | ICD-10-CM

## 2018-08-27 DIAGNOSIS — Z170 Estrogen receptor positive status [ER+]: Secondary | ICD-10-CM | POA: Diagnosis not present

## 2018-08-27 DIAGNOSIS — Z5111 Encounter for antineoplastic chemotherapy: Secondary | ICD-10-CM | POA: Diagnosis not present

## 2018-08-27 DIAGNOSIS — Z800 Family history of malignant neoplasm of digestive organs: Secondary | ICD-10-CM

## 2018-08-27 DIAGNOSIS — Z95828 Presence of other vascular implants and grafts: Secondary | ICD-10-CM

## 2018-08-27 LAB — CBC WITH DIFFERENTIAL (CANCER CENTER ONLY)
Abs Immature Granulocytes: 0.04 10*3/uL (ref 0.00–0.07)
Basophils Absolute: 0 10*3/uL (ref 0.0–0.1)
Basophils Relative: 0 %
Eosinophils Absolute: 0.1 10*3/uL (ref 0.0–0.5)
Eosinophils Relative: 1 %
HCT: 31.6 % — ABNORMAL LOW (ref 36.0–46.0)
Hemoglobin: 10.1 g/dL — ABNORMAL LOW (ref 12.0–15.0)
Immature Granulocytes: 1 %
Lymphocytes Relative: 14 %
Lymphs Abs: 1 10*3/uL (ref 0.7–4.0)
MCH: 30.3 pg (ref 26.0–34.0)
MCHC: 32 g/dL (ref 30.0–36.0)
MCV: 94.9 fL (ref 80.0–100.0)
Monocytes Absolute: 0.5 10*3/uL (ref 0.1–1.0)
Monocytes Relative: 7 %
Neutro Abs: 5.2 10*3/uL (ref 1.7–7.7)
Neutrophils Relative %: 77 %
Platelet Count: 351 10*3/uL (ref 150–400)
RBC: 3.33 MIL/uL — ABNORMAL LOW (ref 3.87–5.11)
RDW: 14.3 % (ref 11.5–15.5)
WBC Count: 6.8 10*3/uL (ref 4.0–10.5)
nRBC: 0 % (ref 0.0–0.2)

## 2018-08-27 LAB — CMP (CANCER CENTER ONLY)
ALT: 34 U/L (ref 0–44)
AST: 20 U/L (ref 15–41)
Albumin: 4.1 g/dL (ref 3.5–5.0)
Alkaline Phosphatase: 63 U/L (ref 38–126)
Anion gap: 9 (ref 5–15)
BUN: 8 mg/dL (ref 6–20)
CO2: 23 mmol/L (ref 22–32)
Calcium: 8.9 mg/dL (ref 8.9–10.3)
Chloride: 107 mmol/L (ref 98–111)
Creatinine: 0.71 mg/dL (ref 0.44–1.00)
GFR, Est AFR Am: >60 mL/min (ref >60)
GFR, Estimated: >60 mL/min (ref >60)
Glucose, Bld: 96 mg/dL (ref 70–99)
Potassium: 3.8 mmol/L (ref 3.5–5.1)
Sodium: 139 mmol/L (ref 135–145)
Total Bilirubin: 0.4 mg/dL (ref 0.3–1.2)
Total Protein: 7.2 g/dL (ref 6.5–8.1)

## 2018-08-27 MED ORDER — SODIUM CHLORIDE 0.9 % IV SOLN
Freq: Once | INTRAVENOUS | Status: AC
  Administered 2018-08-27: 13:00:00 via INTRAVENOUS
  Filled 2018-08-27: qty 250

## 2018-08-27 MED ORDER — DIPHENHYDRAMINE HCL 50 MG/ML IJ SOLN
50.0000 mg | Freq: Once | INTRAMUSCULAR | Status: AC
  Administered 2018-08-27: 50 mg via INTRAVENOUS

## 2018-08-27 MED ORDER — SODIUM CHLORIDE 0.9% FLUSH
10.0000 mL | INTRAVENOUS | Status: DC | PRN
  Administered 2018-08-27: 10 mL
  Filled 2018-08-27: qty 10

## 2018-08-27 MED ORDER — SODIUM CHLORIDE 0.9 % IV SOLN
80.0000 mg/m2 | Freq: Once | INTRAVENOUS | Status: AC
  Administered 2018-08-27: 168 mg via INTRAVENOUS
  Filled 2018-08-27: qty 28

## 2018-08-27 MED ORDER — DIPHENHYDRAMINE HCL 50 MG/ML IJ SOLN
INTRAMUSCULAR | Status: AC
  Filled 2018-08-27: qty 1

## 2018-08-27 MED ORDER — HEPARIN SOD (PORK) LOCK FLUSH 100 UNIT/ML IV SOLN
500.0000 [IU] | Freq: Once | INTRAVENOUS | Status: AC | PRN
  Administered 2018-08-27: 500 [IU]
  Filled 2018-08-27: qty 5

## 2018-08-27 MED ORDER — SODIUM CHLORIDE 0.9 % IV SOLN
10.0000 mg | Freq: Once | INTRAVENOUS | Status: AC
  Administered 2018-08-27: 10 mg via INTRAVENOUS
  Filled 2018-08-27: qty 1

## 2018-08-27 MED ORDER — FAMOTIDINE IN NACL 20-0.9 MG/50ML-% IV SOLN
INTRAVENOUS | Status: AC
  Filled 2018-08-27: qty 50

## 2018-08-27 MED ORDER — FAMOTIDINE IN NACL 20-0.9 MG/50ML-% IV SOLN
20.0000 mg | Freq: Once | INTRAVENOUS | Status: AC
  Administered 2018-08-27: 20 mg via INTRAVENOUS

## 2018-08-27 NOTE — Patient Instructions (Signed)
Coronavirus (COVID-19) Are you at risk?  Are you at risk for the Coronavirus (COVID-19)?  To be considered HIGH RISK for Coronavirus (COVID-19), you have to meet the following criteria:  . Traveled to [place], [place], [place], [place] or [place]; or in the [place] to [place], [place], [place], or [place]; and have fever, cough, and shortness of breath within the last 2 weeks of travel OR . Been in close contact with a person diagnosed with COVID-19 within the last 2 weeks and have fever, cough, and shortness of breath . IF YOU DO NOT MEET THESE CRITERIA, YOU ARE CONSIDERED LOW RISK FOR COVID-19.  What to do if you are HIGH RISK for COVID-19?  . If you are having a medical emergency, call 911. . Seek medical care right away. Before you go to a doctor's office, urgent care or emergency department, call ahead and tell them about your recent travel, contact with someone diagnosed with COVID-19, and your symptoms. You should receive instructions from your physician's office regarding next steps of care.  . When you arrive at healthcare provider, tell the healthcare staff immediately you have returned from visiting [place], [place], [place], [place] or [place]; or traveled in the [place] to [place], [place], [place], or [place]; in the last two weeks or you have been in close contact with a person diagnosed with COVID-19 in the last 2 weeks.   . Tell the health care staff about your symptoms: fever, cough and shortness of breath. . After you have been seen by a medical provider, you will be either: o Tested for (COVID-19) and discharged home on quarantine except to seek medical care if symptoms worsen, and asked to  - Stay home and avoid contact with others until you get your results (4-5 days)  - Avoid travel on public transportation if possible (such as bus, train, or airplane) or o Sent to the Emergency Department by EMS for evaluation, COVID-19 testing, and possible  admission depending on your condition and test results.  What to do if you are LOW RISK for COVID-19?  Reduce your risk of any infection by using the same precautions used for avoiding the common cold or flu:  . Wash your hands often with soap and warm water for at least 20 seconds.  If soap and water are not readily available, use an alcohol-based hand sanitizer with at least 60% alcohol.  . If coughing or sneezing, cover your mouth and nose by coughing or sneezing into the elbow areas of your shirt or coat, into a tissue or into your sleeve (not your hands). . Avoid shaking hands with others and consider head nods or verbal greetings only. . Avoid touching your eyes, nose, or mouth with unwashed hands.  . Avoid close contact with people who are sick. . Avoid places or events with large numbers of people in one location, like concerts or sporting events. . Carefully consider travel plans you have or are making. . If you are planning any travel outside or inside the [place], visit the CDC's Travelers' Health webpage for the latest health notices. . If you have some symptoms but not all symptoms, continue to monitor at home and seek medical attention if your symptoms worsen. . If you are having a medical emergency, call 911.   ADDITIONAL HEALTHCARE OPTIONS FOR PATIENTS   Telehealth / e-Visit: [email or web address]         [facility]: [phone]  [facility]: [phone]                   [facility]: [phone]   [facility] Discharge Instructions for Patients Receiving Chemotherapy  Today you received the following chemotherapy agents: Paclitaxel (Taxol)  To help prevent nausea and vomiting after your treatment, we encourage you to take your nausea medication as directed.    If you develop nausea and vomiting that is not controlled by your nausea medication, call the clinic.    BELOW ARE SYMPTOMS THAT SHOULD BE REPORTED IMMEDIATELY:  *FEVER GREATER THAN 100.5 F  *CHILLS WITH OR WITHOUT FEVER  NAUSEA AND VOMITING THAT IS NOT CONTROLLED WITH YOUR NAUSEA MEDICATION  *UNUSUAL SHORTNESS OF BREATH  *UNUSUAL BRUISING OR BLEEDING  TENDERNESS IN MOUTH AND THROAT WITH OR WITHOUT PRESENCE OF ULCERS  *URINARY PROBLEMS  *BOWEL PROBLEMS  UNUSUAL RASH Items with * indicate a potential emergency and should be followed up as soon as possible.  Feel free to call the clinic should you have any questions or concerns. The clinic phone number is [phone].  Please show the CHEMO ALERT CARD at check-in to the Emergency Department and triage nurse.   

## 2018-09-02 ENCOUNTER — Other Ambulatory Visit: Payer: Self-pay | Admitting: Hematology and Oncology

## 2018-09-02 DIAGNOSIS — C50412 Malignant neoplasm of upper-outer quadrant of left female breast: Secondary | ICD-10-CM

## 2018-09-02 DIAGNOSIS — Z170 Estrogen receptor positive status [ER+]: Secondary | ICD-10-CM

## 2018-09-03 ENCOUNTER — Other Ambulatory Visit: Payer: 59

## 2018-09-03 ENCOUNTER — Inpatient Hospital Stay: Payer: 59

## 2018-09-03 ENCOUNTER — Other Ambulatory Visit: Payer: Self-pay

## 2018-09-03 ENCOUNTER — Encounter: Payer: Self-pay | Admitting: *Deleted

## 2018-09-03 ENCOUNTER — Ambulatory Visit: Payer: 59

## 2018-09-03 VITALS — BP 138/98 | HR 100 | Temp 98.7°F | Resp 18

## 2018-09-03 DIAGNOSIS — C50412 Malignant neoplasm of upper-outer quadrant of left female breast: Secondary | ICD-10-CM

## 2018-09-03 DIAGNOSIS — Z5111 Encounter for antineoplastic chemotherapy: Secondary | ICD-10-CM | POA: Diagnosis not present

## 2018-09-03 DIAGNOSIS — Z170 Estrogen receptor positive status [ER+]: Secondary | ICD-10-CM

## 2018-09-03 DIAGNOSIS — Z95828 Presence of other vascular implants and grafts: Secondary | ICD-10-CM

## 2018-09-03 LAB — CMP (CANCER CENTER ONLY)
ALT: 23 U/L (ref 0–44)
AST: 16 U/L (ref 15–41)
Albumin: 4 g/dL (ref 3.5–5.0)
Alkaline Phosphatase: 60 U/L (ref 38–126)
Anion gap: 8 (ref 5–15)
BUN: 7 mg/dL (ref 6–20)
CO2: 24 mmol/L (ref 22–32)
Calcium: 9 mg/dL (ref 8.9–10.3)
Chloride: 107 mmol/L (ref 98–111)
Creatinine: 0.68 mg/dL (ref 0.44–1.00)
GFR, Est AFR Am: >60 mL/min (ref >60)
GFR, Estimated: >60 mL/min (ref >60)
Glucose, Bld: 100 mg/dL — ABNORMAL HIGH (ref 70–99)
Potassium: 3.9 mmol/L (ref 3.5–5.1)
Sodium: 139 mmol/L (ref 135–145)
Total Bilirubin: 0.3 mg/dL (ref 0.3–1.2)
Total Protein: 7.1 g/dL (ref 6.5–8.1)

## 2018-09-03 LAB — CBC WITH DIFFERENTIAL (CANCER CENTER ONLY)
Abs Immature Granulocytes: 0.02 10*3/uL (ref 0.00–0.07)
Basophils Absolute: 0 10*3/uL (ref 0.0–0.1)
Basophils Relative: 0 %
Eosinophils Absolute: 0.1 10*3/uL (ref 0.0–0.5)
Eosinophils Relative: 1 %
HCT: 31.5 % — ABNORMAL LOW (ref 36.0–46.0)
Hemoglobin: 10.4 g/dL — ABNORMAL LOW (ref 12.0–15.0)
Immature Granulocytes: 0 %
Lymphocytes Relative: 15 %
Lymphs Abs: 0.8 10*3/uL (ref 0.7–4.0)
MCH: 31 pg (ref 26.0–34.0)
MCHC: 33 g/dL (ref 30.0–36.0)
MCV: 93.8 fL (ref 80.0–100.0)
Monocytes Absolute: 0.5 10*3/uL (ref 0.1–1.0)
Monocytes Relative: 10 %
Neutro Abs: 3.6 10*3/uL (ref 1.7–7.7)
Neutrophils Relative %: 74 %
Platelet Count: 351 10*3/uL (ref 150–400)
RBC: 3.36 MIL/uL — ABNORMAL LOW (ref 3.87–5.11)
RDW: 13.2 % (ref 11.5–15.5)
WBC Count: 4.9 10*3/uL (ref 4.0–10.5)
nRBC: 0 % (ref 0.0–0.2)

## 2018-09-03 MED ORDER — HEPARIN SOD (PORK) LOCK FLUSH 100 UNIT/ML IV SOLN
500.0000 [IU] | Freq: Once | INTRAVENOUS | Status: AC | PRN
  Administered 2018-09-03: 500 [IU]
  Filled 2018-09-03: qty 5

## 2018-09-03 MED ORDER — SODIUM CHLORIDE 0.9 % IV SOLN
Freq: Once | INTRAVENOUS | Status: AC
  Administered 2018-09-03: 12:00:00 via INTRAVENOUS
  Filled 2018-09-03: qty 250

## 2018-09-03 MED ORDER — DIPHENHYDRAMINE HCL 50 MG/ML IJ SOLN
50.0000 mg | Freq: Once | INTRAMUSCULAR | Status: AC
  Administered 2018-09-03: 50 mg via INTRAVENOUS

## 2018-09-03 MED ORDER — SODIUM CHLORIDE 0.9 % IV SOLN
10.0000 mg | Freq: Once | INTRAVENOUS | Status: AC
  Administered 2018-09-03: 10 mg via INTRAVENOUS
  Filled 2018-09-03: qty 1

## 2018-09-03 MED ORDER — FAMOTIDINE IN NACL 20-0.9 MG/50ML-% IV SOLN
INTRAVENOUS | Status: AC
  Filled 2018-09-03: qty 50

## 2018-09-03 MED ORDER — SODIUM CHLORIDE 0.9 % IV SOLN
80.0000 mg/m2 | Freq: Once | INTRAVENOUS | Status: AC
  Administered 2018-09-03: 168 mg via INTRAVENOUS
  Filled 2018-09-03: qty 28

## 2018-09-03 MED ORDER — FAMOTIDINE IN NACL 20-0.9 MG/50ML-% IV SOLN
20.0000 mg | Freq: Once | INTRAVENOUS | Status: AC
  Administered 2018-09-03: 20 mg via INTRAVENOUS

## 2018-09-03 MED ORDER — SODIUM CHLORIDE 0.9% FLUSH
10.0000 mL | INTRAVENOUS | Status: DC | PRN
  Administered 2018-09-03: 10 mL
  Filled 2018-09-03: qty 10

## 2018-09-03 MED ORDER — DIPHENHYDRAMINE HCL 50 MG/ML IJ SOLN
INTRAMUSCULAR | Status: AC
  Filled 2018-09-03: qty 1

## 2018-09-03 NOTE — Patient Instructions (Addendum)
Coronavirus (COVID-19) Are you at risk?  Are you at risk for the Coronavirus (COVID-19)?  To be considered HIGH RISK for Coronavirus (COVID-19), you have to meet the following criteria:  . Traveled to [place], [place], [place], [place] or [place]; or in the [place] to [place], [place], [place], or [place]; and have fever, cough, and shortness of breath within the last 2 weeks of travel OR . Been in close contact with a person diagnosed with COVID-19 within the last 2 weeks and have fever, cough, and shortness of breath . IF YOU DO NOT MEET THESE CRITERIA, YOU ARE CONSIDERED LOW RISK FOR COVID-19.  What to do if you are HIGH RISK for COVID-19?  . If you are having a medical emergency, call 911. . Seek medical care right away. Before you go to a doctor's office, urgent care or emergency department, call ahead and tell them about your recent travel, contact with someone diagnosed with COVID-19, and your symptoms. You should receive instructions from your physician's office regarding next steps of care.  . When you arrive at healthcare provider, tell the healthcare staff immediately you have returned from visiting [place], [place], [place], [place] or [place]; or traveled in the [place] to [place], [place], [place], or [place]; in the last two weeks or you have been in close contact with a person diagnosed with COVID-19 in the last 2 weeks.   . Tell the health care staff about your symptoms: fever, cough and shortness of breath. . After you have been seen by a medical provider, you will be either: o Tested for (COVID-19) and discharged home on quarantine except to seek medical care if symptoms worsen, and asked to  - Stay home and avoid contact with others until you get your results (4-5 days)  - Avoid travel on public transportation if possible (such as bus, train, or airplane) or o Sent to the Emergency Department by EMS for evaluation, COVID-19 testing, and possible  admission depending on your condition and test results.  What to do if you are LOW RISK for COVID-19?  Reduce your risk of any infection by using the same precautions used for avoiding the common cold or flu:  . Wash your hands often with soap and warm water for at least 20 seconds.  If soap and water are not readily available, use an alcohol-based hand sanitizer with at least 60% alcohol.  . If coughing or sneezing, cover your mouth and nose by coughing or sneezing into the elbow areas of your shirt or coat, into a tissue or into your sleeve (not your hands). . Avoid shaking hands with others and consider head nods or verbal greetings only. . Avoid touching your eyes, nose, or mouth with unwashed hands.  . Avoid close contact with people who are sick. . Avoid places or events with large numbers of people in one location, like concerts or sporting events. . Carefully consider travel plans you have or are making. . If you are planning any travel outside or inside the [place], visit the CDC's Travelers' Health webpage for the latest health notices. . If you have some symptoms but not all symptoms, continue to monitor at home and seek medical attention if your symptoms worsen. . If you are having a medical emergency, call 911.   ADDITIONAL HEALTHCARE OPTIONS FOR PATIENTS  [facility] / e-Visit: [email or web address]         [facility]: [phone]  [facility]: [phone]                   [facility]: [phone]   [facility] Discharge Instructions for Patients Receiving Chemotherapy  Today you received the following chemotherapy agents: Paclitaxel (Taxol)  To help prevent nausea and vomiting after your treatment, we encourage you to take your nausea medication as directed.    If you develop nausea and vomiting that is not controlled by your nausea medication, call the clinic.    BELOW ARE SYMPTOMS THAT SHOULD BE REPORTED IMMEDIATELY:  *FEVER GREATER THAN 100.5 F  *CHILLS WITH OR WITHOUT FEVER  NAUSEA AND VOMITING THAT IS NOT CONTROLLED WITH YOUR NAUSEA MEDICATION  *UNUSUAL SHORTNESS OF BREATH  *UNUSUAL BRUISING OR BLEEDING  TENDERNESS IN MOUTH AND THROAT WITH OR WITHOUT PRESENCE OF ULCERS  *URINARY PROBLEMS  *BOWEL PROBLEMS  UNUSUAL RASH Items with * indicate a potential emergency and should be followed up as soon as possible.  Feel free to call the clinic should you have any questions or concerns. The clinic phone number is [phone].  Please show the CHEMO ALERT CARD at check-in to the Emergency Department and triage nurse.   

## 2018-09-04 NOTE — Assessment & Plan Note (Signed)
05/13/2018:Evaluation of 5 months of thickening of the left breast upper outer quadrant. 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 Several lymph nodes were identified by ultrasound. T2 N1 stage IIb clinical stage  Treatment plan: 1.Neoadjuvant chemotherapy with dose dense Adriamycin and Cytoxan followed by Taxol weekly x12 2.Breast conserving surgery with targeted node dissection if possible 3.Adjuvant radiation therapy 4.Followed by adjuvant antiestrogen therapy (complete estrogen blockade with Zoladex plus aromatase inhibitor versus oophorectomy) ---------------------------------------------------------------------- Current treatment:Completed 4 cycles ofdose dense Adriamycin and Cytoxan, today cycle8of Taxol Echocardiogram 05/23/2018: EF 60 to 65% Labs reviewed  Chemo toxicities: Denies any nausea or vomiting. Complete loss of hair Mild fatigue Mild anemia Monitoring closely for peripheral neuropathy--none at this point  Genetic testing is negative Return to clinic in2 weeks for cycle10of Taxol

## 2018-09-09 NOTE — Progress Notes (Signed)
Patient Care Team: [name], MD as PCP - General (Family Medicine) Mauro Kaufmann, RN as Oncology Nurse Navigator Rockwell Germany, RN as Oncology Nurse Navigator  DIAGNOSIS:    ICD-10-CM   1. Malignant neoplasm of upper-outer quadrant of left breast in female, estrogen receptor positive ([facility])  C50.412    Z17.0     SUMMARY OF ONCOLOGIC HISTORY: Oncology History  Malignant neoplasm of upper-outer quadrant of left breast in female, estrogen receptor positive ([place])  05/13/2018 Initial Diagnosis   Evaluation of 5 months of thickening of the left breast upper outer quadrant.  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  Several lymph nodes were identified by ultrasound.  T2 N1 stage IIb clinical stage   05/16/2018 Cancer Staging   Staging form: Breast, AJCC 8th Edition - Clinical stage from 05/16/2018: Stage IIB (cT2, cN1, cM0, G3, ER+, PR+, HER2-) - Signed by [name], MD on 05/16/2018   05/28/2018 -  Neo-Adjuvant Chemotherapy   Neoadjuvant chemotherapy with dose dense Adriamycin and Cytoxan followed by Taxol weekly x12   08/16/2018 Genetic Testing   Negative genetic testing on the multi-cancer panel.  The Multi-Gene Panel offered by [facility] includes sequencing and/or deletion duplication testing of the following 85 genes: AIP, ALK, APC, ATM, AXIN2,BAP1,  BARD1, BLM, BMPR1A, BRCA1, BRCA2, BRIP1, CASR, CDC73, CDH1, CDK4, CDKN1B, CDKN1C, CDKN2A (p14ARF), CDKN2A (p16INK4a), CEBPA, CHEK2, CTNNA1, DICER1, DIS3L2, EGFR (c.2369C>T, p.Thr790Met variant only), EPCAM (Deletion/duplication testing only), FH, FLCN, GATA2, GPC3, GREM1 (Promoter region deletion/duplication testing only), HOXB13 (c.251G>A, p.Gly84Glu), HRAS, KIT, MAX, MEN1, MET, MITF  (c.952G>A, p.Glu318Lys variant only), MLH1, MSH2, MSH3, MSH6, MUTYH, NBN, NF1, NF2, NTHL1, PALB2, PDGFRA, PHOX2B, PMS2, POLD1, POLE, POT1, PRKAR1A, PTCH1, PTEN, RAD50, RAD51C, RAD51D, RB1, RECQL4, RET, RNF43, RUNX1, SDHAF2, SDHA (sequence changes only), SDHB, SDHC, SDHD, SMAD4, SMARCA4, SMARCB1, SMARCE1, STK11, SUFU, TERC, TERT, TMEM127, TP53, TSC1, TSC2, VHL, WRN and WT1.  The report date is August 16, 2018.     CHIEF COMPLIANT: Cycle 8 Taxol  INTERVAL HISTORY: Bethany Hanna is a 38 y.o. with above-mentioned history of left breastcancerwho completed 4 cycles ofneoadjuvant chemotherapy with dose dense Adriamycin and Cytoxanandis currently receivingweekly Taxol treatments.She presents to the clinic todayfor cycle8.   REVIEW OF SYSTEMS:   Constitutional: Denies fevers, chills or abnormal weight loss Eyes: Denies blurriness of vision Ears, nose, mouth, throat, and face: Denies mucositis or sore throat Respiratory: Denies cough, dyspnea or wheezes Cardiovascular: Denies palpitation, chest discomfort Gastrointestinal: Denies nausea, heartburn or change in bowel habits Skin: Denies abnormal skin rashes Lymphatics: Denies new lymphadenopathy or easy bruising Neurological: Denies numbness, tingling or new weaknesses Behavioral/Psych: Mood is stable, no new changes  Extremities: No lower extremity edema Breast: denies any pain or lumps or nodules in either breasts All other systems were reviewed with the patient and are negative.  I have reviewed the past medical history, past surgical history, social history and family history with the patient and they are unchanged from previous note.  ALLERGIES:  has No Known Allergies.  MEDICATIONS:  Current Outpatient Medications  Medication Sig Dispense Refill  . acetaminophen (TYLENOL) 500 MG tablet Take 1,000 mg by mouth every 6 (six) hours as needed for moderate pain.     Marland Kitchen ALPRAZolam (XANAX) 0.5 MG tablet Take 1 tablet (  0.5 mg total) by  mouth at bedtime as needed for anxiety. 60 tablet 3  . dexamethasone (DECADRON) 4 MG tablet Take 1 tablet day after chemo and 1 tablet 2 days after chemo with food 8 tablet 0  . folic acid (FOLVITE) 1 MG tablet Take 1 mg by mouth daily.    Marland Kitchen lidocaine-prilocaine (EMLA) cream Apply to affected area once 30 g 3  . LORazepam (ATIVAN) 0.5 MG tablet TAKE 1 TABLET(0.5 MG) BY MOUTH AT BEDTIME AS NEEDED FOR NAUSEA OR VOMITING 30 tablet 0  . mupirocin ointment (BACTROBAN) 2 % Apply to area 3 times daily until healed 30 g 0  . ondansetron (ZOFRAN) 8 MG tablet Take 1 tablet (8 mg total) by mouth 2 (two) times daily as needed. Start on the third day after chemotherapy. 30 tablet 1  . prochlorperazine (COMPAZINE) 10 MG tablet Take 1 tablet (10 mg total) by mouth every 6 (six) hours as needed (Nausea or vomiting). 30 tablet 1  . traMADol (ULTRAM) 50 MG tablet Take 50 mg by mouth every 6 (six) hours as needed for moderate pain.     No current facility-administered medications for this visit.     PHYSICAL EXAMINATION: ECOG PERFORMANCE STATUS: 1 - Symptomatic but completely ambulatory  Vitals:   09/10/18 1125  BP: 134/85  Pulse: 87  Resp: 19  Temp: 98.3 F (36.8 C)  SpO2: 100%   Filed Weights   09/10/18 1125  Weight: 208 lb (94.3 kg)    GENERAL: alert, no distress and comfortable SKIN: skin color, texture, turgor are normal, no rashes or significant lesions EYES: normal, Conjunctiva are pink and non-injected, sclera clear OROPHARYNX: no exudate, no erythema and lips, buccal mucosa, and tongue normal  NECK: supple, thyroid normal size, non-tender, without nodularity LYMPH: no palpable lymphadenopathy in the cervical, axillary or inguinal LUNGS: clear to auscultation and percussion with normal breathing effort HEART: regular rate & rhythm and no murmurs and no lower extremity edema ABDOMEN: abdomen soft, non-tender and normal bowel sounds MUSCULOSKELETAL: no cyanosis of digits and no clubbing   NEURO: alert & oriented x 3 with fluent speech, no focal motor/sensory deficits EXTREMITIES: No lower extremity edema  LABORATORY DATA:  I have reviewed the data as listed CMP Latest Ref Rng & Units 09/03/2018 08/27/2018 08/20/2018  Glucose 70 - 99 mg/dL 100(H) 96 97  BUN 6 - 20 mg/dL 7 8 7   Creatinine 0.44 - 1.00 mg/dL 0.68 0.71 0.77  Sodium 135 - 145 mmol/L 139 139 139  Potassium 3.5 - 5.1 mmol/L 3.9 3.8 3.7  Chloride 98 - 111 mmol/L 107 107 105  CO2 22 - 32 mmol/L 24 23 24   Calcium 8.9 - 10.3 mg/dL 9.0 8.9 9.3  Total Protein 6.5 - 8.1 g/dL 7.1 7.2 7.4  Total Bilirubin 0.3 - 1.2 mg/dL 0.3 0.4 0.4  Alkaline Phos 38 - 126 U/L 60 63 65  AST 15 - 41 U/L 16 20 21   ALT 0 - 44 U/L 23 34 34    Lab Results  Component Value Date   WBC 4.2 09/10/2018   HGB 10.3 (L) 09/10/2018   HCT 31.7 (L) 09/10/2018   MCV 93.5 09/10/2018   PLT 316 09/10/2018   NEUTROABS 2.9 09/10/2018    ASSESSMENT & PLAN:  Malignant neoplasm of upper-outer quadrant of left breast in female, estrogen receptor positive ([facility]) 05/13/2018:Evaluation of 5 months of thickening of the left breast upper outer quadrant. Initial mammogram revealed 2 cm irregular mass, ultrasound revealed 4 cm  irregular mass with spiculated margin 2 o'clock position middle depth biopsy revealed invasive mammary carcinoma grade 3 that was ER PR positive HER-2 negative, left axillary lymph node biopsy positive for breast cancer. Several lymph nodes were identified by ultrasound. T2 N1 stage IIb clinical stage  Treatment plan: 1.Neoadjuvant chemotherapy with dose dense Adriamycin and Cytoxan followed by Taxol weekly x12 2.Breast conserving surgery with targeted node dissection if possible 3.Adjuvant radiation therapy 4.Followed by adjuvant antiestrogen therapy (complete estrogen blockade with Zoladex plus aromatase inhibitor versus oophorectomy) ---------------------------------------------------------------------- Current  treatment:Completed 4 cycles ofdose dense Adriamycin and Cytoxan, today cycle8of Taxol Echocardiogram 05/23/2018: EF 60 to 65% Labs reviewed  Chemo toxicities: Denies any nausea or vomiting. Complete loss of hair Mild fatigue Mild anemia Monitoring closely for peripheral neuropathy--none at this point  Genetic testing is negative Return to clinic in2 weeks for cycle10of Taxol    No orders of the defined types were placed in this encounter.  The patient has a good understanding of the overall plan. she agrees with it. she will call with any problems that may develop before the next visit here.  [name], MD 09/10/2018  Julious Oka Dorshimer am acting as scribe for Dr. [name].  I have reviewed the above documentation for accuracy and completeness, and I agree with the above.

## 2018-09-10 ENCOUNTER — Other Ambulatory Visit: Payer: Self-pay

## 2018-09-10 ENCOUNTER — Inpatient Hospital Stay: Payer: 59

## 2018-09-10 ENCOUNTER — Inpatient Hospital Stay: Payer: 59 | Attending: Hematology and Oncology

## 2018-09-10 ENCOUNTER — Telehealth: Payer: Self-pay | Admitting: *Deleted

## 2018-09-10 ENCOUNTER — Inpatient Hospital Stay (HOSPITAL_BASED_OUTPATIENT_CLINIC_OR_DEPARTMENT_OTHER): Payer: 59 | Admitting: Hematology and Oncology

## 2018-09-10 DIAGNOSIS — Z95828 Presence of other vascular implants and grafts: Secondary | ICD-10-CM

## 2018-09-10 DIAGNOSIS — C50412 Malignant neoplasm of upper-outer quadrant of left female breast: Secondary | ICD-10-CM | POA: Insufficient documentation

## 2018-09-10 DIAGNOSIS — Z5111 Encounter for antineoplastic chemotherapy: Secondary | ICD-10-CM | POA: Diagnosis not present

## 2018-09-10 DIAGNOSIS — Z170 Estrogen receptor positive status [ER+]: Secondary | ICD-10-CM

## 2018-09-10 DIAGNOSIS — R5383 Other fatigue: Secondary | ICD-10-CM | POA: Diagnosis not present

## 2018-09-10 DIAGNOSIS — D6481 Anemia due to antineoplastic chemotherapy: Secondary | ICD-10-CM | POA: Diagnosis not present

## 2018-09-10 DIAGNOSIS — Z79899 Other long term (current) drug therapy: Secondary | ICD-10-CM | POA: Insufficient documentation

## 2018-09-10 DIAGNOSIS — L658 Other specified nonscarring hair loss: Secondary | ICD-10-CM | POA: Insufficient documentation

## 2018-09-10 LAB — PREGNANCY, URINE: Preg Test, Ur: NEGATIVE

## 2018-09-10 LAB — CMP (CANCER CENTER ONLY)
ALT: 23 U/L (ref 0–44)
AST: 17 U/L (ref 15–41)
Albumin: 4 g/dL (ref 3.5–5.0)
Alkaline Phosphatase: 63 U/L (ref 38–126)
Anion gap: 9 (ref 5–15)
BUN: 6 mg/dL (ref 6–20)
CO2: 25 mmol/L (ref 22–32)
Calcium: 9 mg/dL (ref 8.9–10.3)
Chloride: 106 mmol/L (ref 98–111)
Creatinine: 0.7 mg/dL (ref 0.44–1.00)
GFR, Est AFR Am: >60 mL/min (ref >60)
GFR, Estimated: >60 mL/min (ref >60)
Glucose, Bld: 102 mg/dL — ABNORMAL HIGH (ref 70–99)
Potassium: 3.8 mmol/L (ref 3.5–5.1)
Sodium: 140 mmol/L (ref 135–145)
Total Bilirubin: 0.3 mg/dL (ref 0.3–1.2)
Total Protein: 7 g/dL (ref 6.5–8.1)

## 2018-09-10 LAB — CBC WITH DIFFERENTIAL (CANCER CENTER ONLY)
Abs Immature Granulocytes: 0.01 10*3/uL (ref 0.00–0.07)
Basophils Absolute: 0 10*3/uL (ref 0.0–0.1)
Basophils Relative: 1 %
Eosinophils Absolute: 0.1 10*3/uL (ref 0.0–0.5)
Eosinophils Relative: 1 %
HCT: 31.7 % — ABNORMAL LOW (ref 36.0–46.0)
Hemoglobin: 10.3 g/dL — ABNORMAL LOW (ref 12.0–15.0)
Immature Granulocytes: 0 %
Lymphocytes Relative: 19 %
Lymphs Abs: 0.8 10*3/uL (ref 0.7–4.0)
MCH: 30.4 pg (ref 26.0–34.0)
MCHC: 32.5 g/dL (ref 30.0–36.0)
MCV: 93.5 fL (ref 80.0–100.0)
Monocytes Absolute: 0.5 10*3/uL (ref 0.1–1.0)
Monocytes Relative: 11 %
Neutro Abs: 2.9 10*3/uL (ref 1.7–7.7)
Neutrophils Relative %: 68 %
Platelet Count: 316 10*3/uL (ref 150–400)
RBC: 3.39 MIL/uL — ABNORMAL LOW (ref 3.87–5.11)
RDW: 12.6 % (ref 11.5–15.5)
WBC Count: 4.2 10*3/uL (ref 4.0–10.5)
nRBC: 0 % (ref 0.0–0.2)

## 2018-09-10 MED ORDER — DIPHENHYDRAMINE HCL 50 MG/ML IJ SOLN
50.0000 mg | Freq: Once | INTRAMUSCULAR | Status: AC
  Administered 2018-09-10: 50 mg via INTRAVENOUS

## 2018-09-10 MED ORDER — PALONOSETRON HCL INJECTION 0.25 MG/5ML
0.2500 mg | Freq: Once | INTRAVENOUS | Status: AC
  Administered 2018-09-10: 0.25 mg via INTRAVENOUS

## 2018-09-10 MED ORDER — FAMOTIDINE IN NACL 20-0.9 MG/50ML-% IV SOLN
INTRAVENOUS | Status: AC
  Filled 2018-09-10: qty 50

## 2018-09-10 MED ORDER — PALONOSETRON HCL INJECTION 0.25 MG/5ML
INTRAVENOUS | Status: AC
  Filled 2018-09-10: qty 5

## 2018-09-10 MED ORDER — ONDANSETRON HCL 8 MG PO TABS: 8.0000 mg | ORAL_TABLET | Freq: Two times a day (BID) | ORAL | 1 refills | Status: DC | PRN

## 2018-09-10 MED ORDER — DIPHENHYDRAMINE HCL 50 MG/ML IJ SOLN
INTRAMUSCULAR | Status: AC
  Filled 2018-09-10: qty 1

## 2018-09-10 MED ORDER — SODIUM CHLORIDE 0.9 % IV SOLN
10.0000 mg | Freq: Once | INTRAVENOUS | Status: AC
  Administered 2018-09-10: 10 mg via INTRAVENOUS
  Filled 2018-09-10: qty 1

## 2018-09-10 MED ORDER — HEPARIN SOD (PORK) LOCK FLUSH 100 UNIT/ML IV SOLN
500.0000 [IU] | Freq: Once | INTRAVENOUS | Status: AC | PRN
  Administered 2018-09-10: 500 [IU]
  Filled 2018-09-10: qty 5

## 2018-09-10 MED ORDER — FAMOTIDINE IN NACL 20-0.9 MG/50ML-% IV SOLN
20.0000 mg | Freq: Once | INTRAVENOUS | Status: AC
  Administered 2018-09-10: 20 mg via INTRAVENOUS

## 2018-09-10 MED ORDER — SODIUM CHLORIDE 0.9% FLUSH
10.0000 mL | INTRAVENOUS | Status: DC | PRN
  Administered 2018-09-10: 10 mL
  Filled 2018-09-10: qty 10

## 2018-09-10 MED ORDER — SODIUM CHLORIDE 0.9 % IV SOLN
Freq: Once | INTRAVENOUS | Status: AC
  Administered 2018-09-10: 13:00:00 via INTRAVENOUS
  Filled 2018-09-10: qty 250

## 2018-09-10 MED ORDER — SODIUM CHLORIDE 0.9 % IV SOLN
80.0000 mg/m2 | Freq: Once | INTRAVENOUS | Status: AC
  Administered 2018-09-10: 168 mg via INTRAVENOUS
  Filled 2018-09-10: qty 28

## 2018-09-10 NOTE — Telephone Encounter (Signed)
Left vm to assess needs during chemo. Contact information provided for questions or needs.

## 2018-09-10 NOTE — Patient Instructions (Signed)

## 2018-09-10 NOTE — Patient Instructions (Signed)
[facility] Discharge Instructions for Patients Receiving Chemotherapy  Today you received the following chemotherapy agents:  Taxol.  To help prevent nausea and vomiting after your treatment, we encourage you to take your nausea medication as directed.   If you develop nausea and vomiting that is not controlled by your nausea medication, call the clinic.   BELOW ARE SYMPTOMS THAT SHOULD BE REPORTED IMMEDIATELY:  *FEVER GREATER THAN 100.5 F  *CHILLS WITH OR WITHOUT FEVER  NAUSEA AND VOMITING THAT IS NOT CONTROLLED WITH YOUR NAUSEA MEDICATION  *UNUSUAL SHORTNESS OF BREATH  *UNUSUAL BRUISING OR BLEEDING  TENDERNESS IN MOUTH AND THROAT WITH OR WITHOUT PRESENCE OF ULCERS  *URINARY PROBLEMS  *BOWEL PROBLEMS  UNUSUAL RASH Items with * indicate a potential emergency and should be followed up as soon as possible.  Feel free to call the clinic should you have any questions or concerns. The clinic phone number is [phone].  Please show the CHEMO ALERT CARD at check-in to the Emergency Department and triage nurse.   

## 2018-09-17 ENCOUNTER — Inpatient Hospital Stay: Payer: 59

## 2018-09-17 ENCOUNTER — Encounter: Payer: Self-pay | Admitting: *Deleted

## 2018-09-17 ENCOUNTER — Other Ambulatory Visit: Payer: Self-pay

## 2018-09-17 VITALS — BP 142/102 | HR 97 | Temp 98.5°F | Resp 18

## 2018-09-17 DIAGNOSIS — C50412 Malignant neoplasm of upper-outer quadrant of left female breast: Secondary | ICD-10-CM

## 2018-09-17 DIAGNOSIS — Z170 Estrogen receptor positive status [ER+]: Secondary | ICD-10-CM

## 2018-09-17 DIAGNOSIS — Z5111 Encounter for antineoplastic chemotherapy: Secondary | ICD-10-CM | POA: Diagnosis not present

## 2018-09-17 DIAGNOSIS — Z95828 Presence of other vascular implants and grafts: Secondary | ICD-10-CM

## 2018-09-17 LAB — CMP (CANCER CENTER ONLY)
ALT: 18 U/L (ref 0–44)
AST: 12 U/L — ABNORMAL LOW (ref 15–41)
Albumin: 4 g/dL (ref 3.5–5.0)
Alkaline Phosphatase: 66 U/L (ref 38–126)
Anion gap: 10 (ref 5–15)
BUN: 8 mg/dL (ref 6–20)
CO2: 25 mmol/L (ref 22–32)
Calcium: 10.1 mg/dL (ref 8.9–10.3)
Chloride: 106 mmol/L (ref 98–111)
Creatinine: 0.71 mg/dL (ref 0.44–1.00)
GFR, Est AFR Am: >60 mL/min (ref >60)
GFR, Estimated: >60 mL/min (ref >60)
Glucose, Bld: 138 mg/dL — ABNORMAL HIGH (ref 70–99)
Potassium: 3.5 mmol/L (ref 3.5–5.1)
Sodium: 141 mmol/L (ref 135–145)
Total Bilirubin: 0.3 mg/dL (ref 0.3–1.2)
Total Protein: 7.1 g/dL (ref 6.5–8.1)

## 2018-09-17 LAB — CBC WITH DIFFERENTIAL (CANCER CENTER ONLY)
Abs Immature Granulocytes: 0.03 10*3/uL (ref 0.00–0.07)
Basophils Absolute: 0 10*3/uL (ref 0.0–0.1)
Basophils Relative: 1 %
Eosinophils Absolute: 0.1 10*3/uL (ref 0.0–0.5)
Eosinophils Relative: 1 %
HCT: 33.7 % — ABNORMAL LOW (ref 36.0–46.0)
Hemoglobin: 11.2 g/dL — ABNORMAL LOW (ref 12.0–15.0)
Immature Granulocytes: 1 %
Lymphocytes Relative: 16 %
Lymphs Abs: 0.9 10*3/uL (ref 0.7–4.0)
MCH: 30.4 pg (ref 26.0–34.0)
MCHC: 33.2 g/dL (ref 30.0–36.0)
MCV: 91.6 fL (ref 80.0–100.0)
Monocytes Absolute: 0.4 10*3/uL (ref 0.1–1.0)
Monocytes Relative: 8 %
Neutro Abs: 4.2 10*3/uL (ref 1.7–7.7)
Neutrophils Relative %: 73 %
Platelet Count: 365 10*3/uL (ref 150–400)
RBC: 3.68 MIL/uL — ABNORMAL LOW (ref 3.87–5.11)
RDW: 12.1 % (ref 11.5–15.5)
WBC Count: 5.6 10*3/uL (ref 4.0–10.5)
nRBC: 0 % (ref 0.0–0.2)

## 2018-09-17 MED ORDER — SODIUM CHLORIDE 0.9% FLUSH
10.0000 mL | INTRAVENOUS | Status: DC | PRN
  Administered 2018-09-17: 17:00:00 10 mL
  Filled 2018-09-17: qty 10

## 2018-09-17 MED ORDER — SODIUM CHLORIDE 0.9 % IV SOLN
Freq: Once | INTRAVENOUS | Status: AC
  Administered 2018-09-17: 15:00:00 via INTRAVENOUS
  Filled 2018-09-17: qty 250

## 2018-09-17 MED ORDER — DEXAMETHASONE SODIUM PHOSPHATE 10 MG/ML IJ SOLN
INTRAMUSCULAR | Status: AC
  Filled 2018-09-17: qty 1

## 2018-09-17 MED ORDER — SODIUM CHLORIDE 0.9% FLUSH
10.0000 mL | INTRAVENOUS | Status: DC | PRN
  Administered 2018-09-17: 10 mL
  Filled 2018-09-17: qty 10

## 2018-09-17 MED ORDER — SODIUM CHLORIDE 0.9 % IV SOLN
80.0000 mg/m2 | Freq: Once | INTRAVENOUS | Status: AC
  Administered 2018-09-17: 16:00:00 168 mg via INTRAVENOUS
  Filled 2018-09-17: qty 28

## 2018-09-17 MED ORDER — DIPHENHYDRAMINE HCL 50 MG/ML IJ SOLN
INTRAMUSCULAR | Status: AC
  Filled 2018-09-17: qty 1

## 2018-09-17 MED ORDER — SODIUM CHLORIDE 0.9 % IV SOLN
10.0000 mg | Freq: Once | INTRAVENOUS | Status: AC
  Administered 2018-09-17: 10 mg via INTRAVENOUS
  Filled 2018-09-17: qty 1

## 2018-09-17 MED ORDER — PALONOSETRON HCL INJECTION 0.25 MG/5ML
INTRAVENOUS | Status: AC
  Filled 2018-09-17: qty 5

## 2018-09-17 MED ORDER — GOSERELIN ACETATE 3.6 MG ~~LOC~~ IMPL
DRUG_IMPLANT | SUBCUTANEOUS | Status: AC
  Filled 2018-09-17: qty 3.6

## 2018-09-17 MED ORDER — FAMOTIDINE IN NACL 20-0.9 MG/50ML-% IV SOLN
20.0000 mg | Freq: Once | INTRAVENOUS | Status: AC
  Administered 2018-09-17: 20 mg via INTRAVENOUS

## 2018-09-17 MED ORDER — DIPHENHYDRAMINE HCL 50 MG/ML IJ SOLN
50.0000 mg | Freq: Once | INTRAMUSCULAR | Status: AC
  Administered 2018-09-17: 15:00:00 50 mg via INTRAVENOUS

## 2018-09-17 MED ORDER — PALONOSETRON HCL INJECTION 0.25 MG/5ML
0.2500 mg | Freq: Once | INTRAVENOUS | Status: AC
  Administered 2018-09-17: 15:00:00 0.25 mg via INTRAVENOUS

## 2018-09-17 MED ORDER — FAMOTIDINE IN NACL 20-0.9 MG/50ML-% IV SOLN
INTRAVENOUS | Status: AC
  Filled 2018-09-17: qty 50

## 2018-09-17 MED ORDER — HEPARIN SOD (PORK) LOCK FLUSH 100 UNIT/ML IV SOLN
500.0000 [IU] | Freq: Once | INTRAVENOUS | Status: AC | PRN
  Administered 2018-09-17: 500 [IU]
  Filled 2018-09-17: qty 5

## 2018-09-17 MED ORDER — GOSERELIN ACETATE 3.6 MG ~~LOC~~ IMPL
3.6000 mg | DRUG_IMPLANT | Freq: Once | SUBCUTANEOUS | Status: AC
  Administered 2018-09-17: 16:00:00 3.6 mg via SUBCUTANEOUS

## 2018-09-17 NOTE — Patient Instructions (Addendum)
[place] Discharge Instructions for Patients Receiving Chemotherapy  Today you received the following chemotherapy agents Paclitaxel (TAXOL).  To help prevent nausea and vomiting after your treatment, we encourage you to take your nausea medication as prescribed.   If you develop nausea and vomiting that is not controlled by your nausea medication, call the clinic.   BELOW ARE SYMPTOMS THAT SHOULD BE REPORTED IMMEDIATELY:  *FEVER GREATER THAN 100.5 F  *CHILLS WITH OR WITHOUT FEVER  NAUSEA AND VOMITING THAT IS NOT CONTROLLED WITH YOUR NAUSEA MEDICATION  *UNUSUAL SHORTNESS OF BREATH  *UNUSUAL BRUISING OR BLEEDING  TENDERNESS IN MOUTH AND THROAT WITH OR WITHOUT PRESENCE OF ULCERS  *URINARY PROBLEMS  *BOWEL PROBLEMS  UNUSUAL RASH Items with * indicate a potential emergency and should be followed up as soon as possible.  Feel free to call the clinic should you have any questions or concerns. The clinic phone number is [phone].  Please show the [place] at check-in to the Emergency Department and triage nurse.  Goserelin injection What is this medicine? GOSERELIN (GOE se rel in) is similar to a hormone found in the body. It lowers the amount of sex hormones that the body makes. Men will have lower testosterone levels and women will have lower estrogen levels while taking this medicine. In men, this medicine is used to treat prostate cancer; the injection is either given once per month or once every 12 weeks. A once per month injection (only) is used to treat women with endometriosis, dysfunctional uterine bleeding, or advanced breast cancer. This medicine may be used for other purposes; ask your health care provider or pharmacist if you have questions. COMMON BRAND NAME(S): Zoladex What should I tell my health care provider before I take this medicine? They need to know if you have any of these conditions:  bone problems  diabetes  heart  disease  history of irregular heartbeat  an unusual or allergic reaction to goserelin, other medicines, foods, dyes, or preservatives  pregnant or trying to get pregnant  breast-feeding How should I use this medicine? This medicine is for injection under the skin. It is given by a health care professional in a hospital or clinic setting. Talk to your pediatrician regarding the use of this medicine in children. Special care may be needed. Overdosage: If you think you have taken too much of this medicine contact a poison control center or emergency room at once. NOTE: This medicine is only for you. Do not share this medicine with others. What if I miss a dose? It is important not to miss your dose. Call your doctor or health care professional if you are unable to keep an appointment. What may interact with this medicine? Do not take this medicine with any of the following medications:  cisapride  dronedarone  pimozide  thioridazine This medicine may also interact with the following medications:  other medicines that prolong the QT interval (an abnormal heart rhythm) This list may not describe all possible interactions. Give your health care provider a list of all the medicines, herbs, non-prescription drugs, or dietary supplements you use. Also tell them if you smoke, drink alcohol, or use illegal drugs. Some items may interact with your medicine. What should I watch for while using this medicine? Visit your doctor or health care provider for regular checks on your progress. Your symptoms may appear to get worse during the first weeks of this therapy. Tell your doctor or healthcare provider if your symptoms  do not start to get better or if they get worse after this time. Your bones may get weaker if you take this medicine for a long time. If you smoke or frequently drink alcohol you may increase your risk of bone loss. A family history of osteoporosis, chronic use of drugs for seizures  (convulsions), or corticosteroids can also increase your risk of bone loss. Talk to your doctor about how to keep your bones strong. This medicine should stop regular monthly menstruation in women. Tell your doctor if you continue to menstruate. Women should not become pregnant while taking this medicine or for 12 weeks after stopping this medicine. Women should inform their doctor if they wish to become pregnant or think they might be pregnant. There is a potential for serious side effects to an unborn child. Talk to your health care professional or pharmacist for more information. Do not breast-feed an infant while taking this medicine. Men should inform their doctors if they wish to father a child. This medicine may lower sperm counts. Talk to your health care professional or pharmacist for more information. This medicine may increase blood sugar. Ask your healthcare provider if changes in diet or medicines are needed if you have diabetes. What side effects may I notice from receiving this medicine? Side effects that you should report to your doctor or health care professional as soon as possible:  allergic reactions like skin rash, itching or hives, swelling of the face, lips, or tongue  bone pain  breathing problems  changes in vision  chest pain  feeling faint or lightheaded, falls  fever, chills  pain, swelling, warmth in the leg  pain, tingling, numbness in the hands or feet  signs and symptoms of high blood sugar such as being more thirsty or hungry or having to urinate more than normal. You may also feel very tired or have blurry vision  signs and symptoms of low blood pressure like dizziness; feeling faint or lightheaded, falls; unusually weak or tired  stomach pain  swelling of the ankles, feet, hands  trouble passing urine or change in the amount of urine  unusually high or low blood pressure  unusually weak or tired Side effects that usually do not require medical  attention (report to your doctor or health care professional if they continue or are bothersome):  change in sex drive or performance  changes in breast size in both males and females  changes in emotions or moods  headache  hot flashes  irritation at site where injected  loss of appetite  skin problems like acne, dry skin  vaginal dryness This list may not describe all possible side effects. Call your doctor for medical advice about side effects. You may report side effects to FDA at 1-800-FDA-1088. Where should I keep my medicine? This drug is given in a hospital or clinic and will not be stored at home. NOTE: This sheet is a summary. It may not cover all possible information. If you have questions about this medicine, talk to your doctor, pharmacist, or health care provider.  2020 Elsevier/Gold Standard (2018-06-10 14:05:56)   Coronavirus (COVID-19) Are you at risk?  Are you at risk for the Coronavirus (COVID-19)?  To be considered HIGH RISK for Coronavirus (COVID-19), you have to meet the following criteria:  . Traveled to [place], [place], [place], [place] or [place]; or in the [place] to [place], [place], [place], or [place]; and have fever, cough, and shortness of breath within the  last 2 weeks of travel OR . Been in close contact with a person diagnosed with COVID-19 within the last 2 weeks and have fever, cough, and shortness of breath . IF YOU DO NOT MEET THESE CRITERIA, YOU ARE CONSIDERED LOW RISK FOR COVID-19.  What to do if you are HIGH RISK for COVID-19?  Marland Kitchen If you are having a medical emergency, call 911. . Seek medical care right away. Before you go to a doctor's office, urgent care or emergency department, call ahead and tell them about your recent travel, contact with someone diagnosed with COVID-19, and your symptoms. You should receive instructions from your physician's office regarding next steps of care.  . When you arrive at healthcare  provider, tell the healthcare staff immediately you have returned from visiting [place], [place], [place], [place] or [place]; or traveled in the [place] to [place], [place], [place], or [place]; in the last two weeks or you have been in close contact with a person diagnosed with COVID-19 in the last 2 weeks.   . Tell the health care staff about your symptoms: fever, cough and shortness of breath. . After you have been seen by a medical provider, you will be either: o Tested for (COVID-19) and discharged home on quarantine except to seek medical care if symptoms worsen, and asked to  - Stay home and avoid contact with others until you get your results (4-5 days)  - Avoid travel on public transportation if possible (such as bus, train, or airplane) or o Sent to the Emergency Department by EMS for evaluation, COVID-19 testing, and possible admission depending on your condition and test results.  What to do if you are LOW RISK for COVID-19?  Reduce your risk of any infection by using the same precautions used for avoiding the common cold or flu:  Marland Kitchen Wash your hands often with soap and warm water for at least 20 seconds.  If soap and water are not readily available, use an alcohol-based hand sanitizer with at least 60% alcohol.  . If coughing or sneezing, cover your mouth and nose by coughing or sneezing into the elbow areas of your shirt or coat, into a tissue or into your sleeve (not your hands). . Avoid shaking hands with others and consider head nods or verbal greetings only. . Avoid touching your eyes, nose, or mouth with unwashed hands.  . Avoid close contact with people who are sick. . Avoid places or events with large numbers of people in one location, like concerts or sporting events. . Carefully consider travel plans you have or are making. . If you are planning any travel outside or inside the [place], visit the CDC's Travelers' Health webpage for the latest health notices. . If you  have some symptoms but not all symptoms, continue to monitor at home and seek medical attention if your symptoms worsen. . If you are having a medical emergency, call 911.   [place] / e-Visit: [email or web address]         [facility]: [facility]: [phone]                   [facility]: [phone]

## 2018-09-18 NOTE — Assessment & Plan Note (Signed)
05/13/2018:Evaluation of 5 months of thickening of the left breast upper outer quadrant. 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 Several lymph nodes were identified by ultrasound. T2 N1 stage IIb clinical stage  Treatment plan: 1.Neoadjuvant chemotherapy with dose dense Adriamycin and Cytoxan followed by Taxol weekly x12 2.Breast conserving surgery with targeted node dissection if possible 3.Adjuvant radiation therapy 4.Followed by adjuvant antiestrogen therapy(complete estrogen blockade with Zoladex plus aromatase inhibitor versus oophorectomy) ---------------------------------------------------------------------- Current treatment:Completed 4 cycles ofdose dense Adriamycin and Cytoxan, today cycle10of Taxol Echocardiogram 05/23/2018: EF 60 to 65% Labs reviewed  Chemo toxicities: Denies any nausea or vomiting. Complete loss of hair Mild fatigue Mild anemia Monitoring closely for peripheral neuropathy--none at this point  Genetic testing isnegative Return to clinic in2 weeks for cycle12of Taxol

## 2018-09-23 NOTE — Progress Notes (Signed)
Patient Care Team: [name], MD as PCP - General (Family Medicine) Mauro Kaufmann, RN as Oncology Nurse Navigator Rockwell Germany, RN as Oncology Nurse Navigator  DIAGNOSIS:    ICD-10-CM   1. Malignant neoplasm of upper-outer quadrant of left breast in female, estrogen receptor positive ([place])  C50.412 MR BREAST BILATERAL W [place] CAD   Z17.0     SUMMARY OF ONCOLOGIC HISTORY: Oncology History  Malignant neoplasm of upper-outer quadrant of left breast in female, estrogen receptor positive ([place])  05/13/2018 Initial Diagnosis   Evaluation of 5 months of thickening of the left breast upper outer quadrant.  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  Several lymph nodes were identified by ultrasound.  T2 N1 stage IIb clinical stage   05/16/2018 Cancer Staging   Staging form: Breast, AJCC 8th Edition - Clinical stage from 05/16/2018: Stage IIB (cT2, cN1, cM0, G3, ER+, PR+, HER2-) - Signed by [name], MD on 05/16/2018   05/28/2018 -  Neo-Adjuvant Chemotherapy   Neoadjuvant chemotherapy with dose dense Adriamycin and Cytoxan followed by Taxol weekly x12   08/16/2018 Genetic Testing   Negative genetic testing on the multi-cancer panel.  The Multi-Gene Panel offered by [facility] includes sequencing and/or deletion duplication testing of the following 85 genes: AIP, ALK, APC, ATM, AXIN2,BAP1,  BARD1, BLM, BMPR1A, BRCA1, BRCA2, BRIP1, CASR, CDC73, CDH1, CDK4, CDKN1B, CDKN1C, CDKN2A (p14ARF), CDKN2A (p16INK4a), CEBPA, CHEK2, CTNNA1, DICER1, DIS3L2, EGFR (c.2369C>T, p.Thr790Met variant only), EPCAM (Deletion/duplication testing only), FH, FLCN, GATA2, GPC3, GREM1 (Promoter region deletion/duplication testing only), HOXB13 (c.251G>A,  p.Gly84Glu), HRAS, KIT, MAX, MEN1, MET, MITF (c.952G>A, p.Glu318Lys variant only), MLH1, MSH2, MSH3, MSH6, MUTYH, NBN, NF1, NF2, NTHL1, PALB2, PDGFRA, PHOX2B, PMS2, POLD1, POLE, POT1, PRKAR1A, PTCH1, PTEN, RAD50, RAD51C, RAD51D, RB1, RECQL4, RET, RNF43, RUNX1, SDHAF2, SDHA (sequence changes only), SDHB, SDHC, SDHD, SMAD4, SMARCA4, SMARCB1, SMARCE1, STK11, SUFU, TERC, TERT, TMEM127, TP53, TSC1, TSC2, VHL, WRN and WT1.  The report date is August 16, 2018.     CHIEF COMPLIANT: Cycle 10 Taxol  INTERVAL HISTORY: Bethany Hanna is a 38 y.o. with above-mentioned history of left breastcancerwho completed 4 cycles ofneoadjuvant chemotherapy with dose dense Adriamycin and Cytoxanandis currently receivingweekly Taxol treatments.She presents to the clinic todayfor cycle10.  She denies any nausea or vomiting.  Denies any numbness.  She does feel slightly more fatigued.  REVIEW OF SYSTEMS:   Constitutional: Denies fevers, chills or abnormal weight loss Eyes: Denies blurriness of vision Ears, nose, mouth, throat, and face: Denies mucositis or sore throat Respiratory: Denies cough, dyspnea or wheezes Cardiovascular: Denies palpitation, chest discomfort Gastrointestinal: Denies nausea, heartburn or change in bowel habits Skin: Denies abnormal skin rashes Lymphatics: Denies new lymphadenopathy or easy bruising Neurological: Denies numbness, tingling or new weaknesses Behavioral/Psych: Mood is stable, no new changes  Extremities: No lower extremity edema Breast: denies any pain or lumps or nodules in either breasts All other systems were reviewed with the patient and are negative.  I have reviewed the past medical history, past surgical history, social history and family history with the patient and they are unchanged from previous note.  ALLERGIES:  has No Known Allergies.  MEDICATIONS:  Current Outpatient Medications  Medication Sig Dispense Refill  . acetaminophen (TYLENOL) 500 MG tablet Take  1,000  mg by mouth every 6 (six) hours as needed for moderate pain.     Marland Kitchen ALPRAZolam (XANAX) 0.5 MG tablet Take 1 tablet (0.5 mg total) by mouth at bedtime as needed for anxiety. 60 tablet 3  . folic acid (FOLVITE) 1 MG tablet Take 1 mg by mouth daily.    Marland Kitchen lidocaine-prilocaine (EMLA) cream Apply to affected area once 30 g 3  . LORazepam (ATIVAN) 0.5 MG tablet TAKE 1 TABLET(0.5 MG) BY MOUTH AT BEDTIME AS NEEDED FOR NAUSEA OR VOMITING 30 tablet 0  . mupirocin ointment (BACTROBAN) 2 % Apply to area 3 times daily until healed 30 g 0  . ondansetron (ZOFRAN) 8 MG tablet Take 1 tablet (8 mg total) by mouth 2 (two) times daily as needed. Start on the third day after chemotherapy. 30 tablet 1  . prochlorperazine (COMPAZINE) 10 MG tablet Take 1 tablet (10 mg total) by mouth every 6 (six) hours as needed (Nausea or vomiting). 30 tablet 1  . traMADol (ULTRAM) 50 MG tablet Take 50 mg by mouth every 6 (six) hours as needed for moderate pain.     No current facility-administered medications for this visit.     PHYSICAL EXAMINATION: ECOG PERFORMANCE STATUS: 1 - Symptomatic but completely ambulatory  Vitals:   09/24/18 1309  BP: (!) 133/98  Pulse: (!) 115  Resp: 18  Temp: 98.5 F (36.9 C)  SpO2: 100%   There were no vitals filed for this visit.  Physical exam not done due to COVID-19 precautions  LABORATORY DATA:  I have reviewed the data as listed CMP Latest Ref Rng & Units 09/17/2018 09/10/2018 09/03/2018  Glucose 70 - 99 mg/dL 138(H) 102(H) 100(H)  BUN 6 - 20 mg/dL _0 Creatinine 0.44 - 1.00 mg/dL 0.71 0.70 0.68  Sodium 135 - 145 mmol/L 141 140 139  Potassium 3.5 - 5.1 mmol/L 3.5 3.8 3.9  Chloride 98 - 111 mmol/L 106 106 107  CO2 22 - 32 mmol/L _1 Calcium 8.9 - 10.3 mg/dL 10.1 9.0 9.0  Total Protein 6.5 - 8.1 g/dL 7.1 7.0 7.1  Total Bilirubin 0.3 - 1.2 mg/dL 0.3 0.3 0.3  Alkaline Phos 38 - 126 U/L 66 63 60  AST 15 - 41 U/L 12(L) 17 16  ALT 0 - 44 U/L _2 Lab Results   Component Value Date   WBC 6.6 09/24/2018   HGB 11.1 (L) 09/24/2018   HCT 33.1 (L) 09/24/2018   MCV 90.2 09/24/2018   PLT 374 09/24/2018   NEUTROABS 5.2 09/24/2018    ASSESSMENT & PLAN:  Malignant neoplasm of upper-outer quadrant of left breast in female, estrogen receptor positive ([place]) 05/13/2018:Evaluation of 5 months of thickening of the left breast upper outer quadrant. 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 Several lymph nodes were identified by ultrasound. T2 N1 stage IIb clinical stage  Treatment plan: 1.Neoadjuvant chemotherapy with dose dense Adriamycin and Cytoxan followed by Taxol weekly x12 2.Breast conserving surgery with targeted node dissection if possible 3.Adjuvant radiation therapy 4.Followed by adjuvant antiestrogen therapy(complete estrogen blockade with Zoladex plus aromatase inhibitor versus oophorectomy) ---------------------------------------------------------------------- Current treatment:Completed 4 cycles ofdose dense Adriamycin and Cytoxan, today cycle10of Taxol Echocardiogram 05/23/2018: EF 60 to 65% Labs reviewed  Chemo toxicities: Denies  any nausea or vomiting. Complete loss of hair Mild fatigue Mild anemia Monitoring closely for peripheral neuropathy--none at this point  Genetic testing isnegative Return to clinic in2 weeks for cycle12of Taxol I ordered the breast MRI to be done after the 12th cycle of Taxol. I requested Dr. [name] to see her after the breast MRI. I will see her the day after the breast MRI to discuss the final MRI results.  We will see her through a [facility] video visit.   Orders Placed This Encounter  Procedures  . MR BREAST BILATERAL W WO CONTRAST INC  CAD    Standing Status:   Future    Standing Expiration Date:   11/25/2019    Order Specific Question:   ** REASON FOR EXAM (FREE TEXT)    Answer:   Post neoadjuvant chemo    Order Specific Question:   If indicated for the ordered procedure, I authorize the administration of contrast media per Radiology protocol    Answer:   Yes    Order Specific Question:   What is the patient's sedation requirement?    Answer:   No Sedation    Order Specific Question:   Does the patient have a pacemaker or implanted devices?    Answer:   No    Order Specific Question:   Radiology Contrast Protocol - do NOT remove file path    Answer:   [email or web address]    Order Specific Question:   Preferred imaging location?    Answer:   [facility] (table limit-550lbs)   The patient has a good understanding of the overall plan. she agrees with it. she will call with any problems that may develop before the next visit here.  [name], MD 09/24/2018  Julious Oka Dorshimer am acting as scribe for Dr. [name].  I have reviewed the above documentation for accuracy and completeness, and I agree with the above.

## 2018-09-24 ENCOUNTER — Inpatient Hospital Stay: Payer: 59

## 2018-09-24 ENCOUNTER — Other Ambulatory Visit: Payer: Self-pay

## 2018-09-24 ENCOUNTER — Inpatient Hospital Stay (HOSPITAL_BASED_OUTPATIENT_CLINIC_OR_DEPARTMENT_OTHER): Payer: 59 | Admitting: Hematology and Oncology

## 2018-09-24 VITALS — HR 99

## 2018-09-24 DIAGNOSIS — C50412 Malignant neoplasm of upper-outer quadrant of left female breast: Secondary | ICD-10-CM

## 2018-09-24 DIAGNOSIS — Z79899 Other long term (current) drug therapy: Secondary | ICD-10-CM

## 2018-09-24 DIAGNOSIS — Z170 Estrogen receptor positive status [ER+]: Secondary | ICD-10-CM

## 2018-09-24 DIAGNOSIS — D6481 Anemia due to antineoplastic chemotherapy: Secondary | ICD-10-CM

## 2018-09-24 DIAGNOSIS — Z5111 Encounter for antineoplastic chemotherapy: Secondary | ICD-10-CM | POA: Diagnosis not present

## 2018-09-24 DIAGNOSIS — Z95828 Presence of other vascular implants and grafts: Secondary | ICD-10-CM

## 2018-09-24 DIAGNOSIS — R5383 Other fatigue: Secondary | ICD-10-CM | POA: Diagnosis not present

## 2018-09-24 DIAGNOSIS — L658 Other specified nonscarring hair loss: Secondary | ICD-10-CM

## 2018-09-24 LAB — CBC WITH DIFFERENTIAL (CANCER CENTER ONLY)
Abs Immature Granulocytes: 0.04 10*3/uL (ref 0.00–0.07)
Basophils Absolute: 0 10*3/uL (ref 0.0–0.1)
Basophils Relative: 1 %
Eosinophils Absolute: 0.1 10*3/uL (ref 0.0–0.5)
Eosinophils Relative: 1 %
HCT: 33.1 % — ABNORMAL LOW (ref 36.0–46.0)
Hemoglobin: 11.1 g/dL — ABNORMAL LOW (ref 12.0–15.0)
Immature Granulocytes: 1 %
Lymphocytes Relative: 13 %
Lymphs Abs: 0.8 10*3/uL (ref 0.7–4.0)
MCH: 30.2 pg (ref 26.0–34.0)
MCHC: 33.5 g/dL (ref 30.0–36.0)
MCV: 90.2 fL (ref 80.0–100.0)
Monocytes Absolute: 0.4 10*3/uL (ref 0.1–1.0)
Monocytes Relative: 6 %
Neutro Abs: 5.2 10*3/uL (ref 1.7–7.7)
Neutrophils Relative %: 78 %
Platelet Count: 374 10*3/uL (ref 150–400)
RBC: 3.67 MIL/uL — ABNORMAL LOW (ref 3.87–5.11)
RDW: 12.2 % (ref 11.5–15.5)
WBC Count: 6.6 10*3/uL (ref 4.0–10.5)
nRBC: 0 % (ref 0.0–0.2)

## 2018-09-24 LAB — CMP (CANCER CENTER ONLY)
ALT: 14 U/L (ref 0–44)
AST: 14 U/L — ABNORMAL LOW (ref 15–41)
Albumin: 3.8 g/dL (ref 3.5–5.0)
Alkaline Phosphatase: 60 U/L (ref 38–126)
Anion gap: 10 (ref 5–15)
BUN: 9 mg/dL (ref 6–20)
CO2: 23 mmol/L (ref 22–32)
Calcium: 9 mg/dL (ref 8.9–10.3)
Chloride: 106 mmol/L (ref 98–111)
Creatinine: 0.72 mg/dL (ref 0.44–1.00)
GFR, Est AFR Am: >60 mL/min (ref >60)
GFR, Estimated: >60 mL/min (ref >60)
Glucose, Bld: 109 mg/dL — ABNORMAL HIGH (ref 70–99)
Potassium: 3.8 mmol/L (ref 3.5–5.1)
Sodium: 139 mmol/L (ref 135–145)
Total Bilirubin: 0.3 mg/dL (ref 0.3–1.2)
Total Protein: 7 g/dL (ref 6.5–8.1)

## 2018-09-24 MED ORDER — PALONOSETRON HCL INJECTION 0.25 MG/5ML
0.2500 mg | Freq: Once | INTRAVENOUS | Status: AC
  Administered 2018-09-24: 0.25 mg via INTRAVENOUS

## 2018-09-24 MED ORDER — SODIUM CHLORIDE 0.9% FLUSH
10.0000 mL | INTRAVENOUS | Status: DC | PRN
  Administered 2018-09-24: 16:00:00 10 mL
  Filled 2018-09-24: qty 10

## 2018-09-24 MED ORDER — DIPHENHYDRAMINE HCL 50 MG/ML IJ SOLN
INTRAMUSCULAR | Status: AC
  Filled 2018-09-24: qty 1

## 2018-09-24 MED ORDER — FAMOTIDINE IN NACL 20-0.9 MG/50ML-% IV SOLN
INTRAVENOUS | Status: AC
  Filled 2018-09-24: qty 50

## 2018-09-24 MED ORDER — PALONOSETRON HCL INJECTION 0.25 MG/5ML
INTRAVENOUS | Status: AC
  Filled 2018-09-24: qty 5

## 2018-09-24 MED ORDER — HEPARIN SOD (PORK) LOCK FLUSH 100 UNIT/ML IV SOLN
500.0000 [IU] | Freq: Once | INTRAVENOUS | Status: AC | PRN
  Administered 2018-09-24: 16:00:00 500 [IU]
  Filled 2018-09-24: qty 5

## 2018-09-24 MED ORDER — SODIUM CHLORIDE 0.9% FLUSH
10.0000 mL | INTRAVENOUS | Status: DC | PRN
  Administered 2018-09-24: 10 mL
  Filled 2018-09-24: qty 10

## 2018-09-24 MED ORDER — SODIUM CHLORIDE 0.9 % IV SOLN
10.0000 mg | Freq: Once | INTRAVENOUS | Status: AC
  Administered 2018-09-24: 10 mg via INTRAVENOUS
  Filled 2018-09-24: qty 1

## 2018-09-24 MED ORDER — SODIUM CHLORIDE 0.9 % IV SOLN
Freq: Once | INTRAVENOUS | Status: AC
  Administered 2018-09-24: 14:00:00 via INTRAVENOUS
  Filled 2018-09-24: qty 250

## 2018-09-24 MED ORDER — SODIUM CHLORIDE 0.9 % IV SOLN
80.0000 mg/m2 | Freq: Once | INTRAVENOUS | Status: AC
  Administered 2018-09-24: 168 mg via INTRAVENOUS
  Filled 2018-09-24: qty 28

## 2018-09-24 MED ORDER — DIPHENHYDRAMINE HCL 50 MG/ML IJ SOLN
50.0000 mg | Freq: Once | INTRAMUSCULAR | Status: AC
  Administered 2018-09-24: 50 mg via INTRAVENOUS

## 2018-09-24 MED ORDER — FAMOTIDINE IN NACL 20-0.9 MG/50ML-% IV SOLN
20.0000 mg | Freq: Once | INTRAVENOUS | Status: AC
  Administered 2018-09-24: 14:00:00 20 mg via INTRAVENOUS

## 2018-09-25 ENCOUNTER — Telehealth: Payer: Self-pay | Admitting: Physical Therapy

## 2018-09-25 ENCOUNTER — Telehealth: Payer: Self-pay | Admitting: Hematology and Oncology

## 2018-09-25 NOTE — Telephone Encounter (Signed)
I talk with patient regarding video visit °

## 2018-09-25 NOTE — Telephone Encounter (Signed)
Copied from [place] [id number]. Topic: Referral - Request for Referral >> Sep 25, 2018 12:43 PM Richardo Priest, NT wrote: [name], from [facility], called in stating patient is going to be having surgery with one of their surgeons and is needing a referral to be placed due to patient having [facility]. States referral must come from PCP office and that they authorization # needs to come from insurance. Once this is done [name] is asking that it be faxed over to [phone] at the attention of [name]. Patient's appointment is the 31st. Please advise.

## 2018-09-25 NOTE — Telephone Encounter (Signed)
I left [name] a voicemail to call me back.  I need a little more information than what they provided in the message.

## 2018-09-25 NOTE — Telephone Encounter (Signed)
Forwarding to [employer], referral coordinator. Referral was originally placed in March 2020.

## 2018-09-26 ENCOUNTER — Telehealth: Payer: Self-pay | Admitting: *Deleted

## 2018-09-26 NOTE — Telephone Encounter (Signed)
Left vm and sent verified email informing pt of new MRI date and time of 8/3 at 5pm at [facility].

## 2018-10-01 ENCOUNTER — Inpatient Hospital Stay: Payer: 59

## 2018-10-01 ENCOUNTER — Telehealth: Payer: Self-pay

## 2018-10-01 ENCOUNTER — Other Ambulatory Visit: Payer: Self-pay

## 2018-10-01 VITALS — BP 142/98 | HR 95 | Temp 98.9°F | Resp 17

## 2018-10-01 DIAGNOSIS — Z5111 Encounter for antineoplastic chemotherapy: Secondary | ICD-10-CM | POA: Diagnosis not present

## 2018-10-01 DIAGNOSIS — C50412 Malignant neoplasm of upper-outer quadrant of left female breast: Secondary | ICD-10-CM

## 2018-10-01 DIAGNOSIS — Z170 Estrogen receptor positive status [ER+]: Secondary | ICD-10-CM

## 2018-10-01 DIAGNOSIS — Z95828 Presence of other vascular implants and grafts: Secondary | ICD-10-CM

## 2018-10-01 LAB — CMP (CANCER CENTER ONLY)
ALT: 49 U/L — ABNORMAL HIGH (ref 0–44)
AST: 19 U/L (ref 15–41)
Albumin: 3.8 g/dL (ref 3.5–5.0)
Alkaline Phosphatase: 86 U/L (ref 38–126)
Anion gap: 8 (ref 5–15)
BUN: 8 mg/dL (ref 6–20)
CO2: 25 mmol/L (ref 22–32)
Calcium: 9.3 mg/dL (ref 8.9–10.3)
Chloride: 106 mmol/L (ref 98–111)
Creatinine: 0.64 mg/dL (ref 0.44–1.00)
GFR, Est AFR Am: >60 mL/min (ref >60)
GFR, Estimated: >60 mL/min (ref >60)
Glucose, Bld: 94 mg/dL (ref 70–99)
Potassium: 3.8 mmol/L (ref 3.5–5.1)
Sodium: 139 mmol/L (ref 135–145)
Total Bilirubin: 0.2 mg/dL — ABNORMAL LOW (ref 0.3–1.2)
Total Protein: 6.9 g/dL (ref 6.5–8.1)

## 2018-10-01 LAB — CBC WITH DIFFERENTIAL (CANCER CENTER ONLY)
Abs Immature Granulocytes: 0.03 10*3/uL (ref 0.00–0.07)
Basophils Absolute: 0 10*3/uL (ref 0.0–0.1)
Basophils Relative: 1 %
Eosinophils Absolute: 0.1 10*3/uL (ref 0.0–0.5)
Eosinophils Relative: 1 %
HCT: 32.8 % — ABNORMAL LOW (ref 36.0–46.0)
Hemoglobin: 11 g/dL — ABNORMAL LOW (ref 12.0–15.0)
Immature Granulocytes: 1 %
Lymphocytes Relative: 18 %
Lymphs Abs: 1.1 10*3/uL (ref 0.7–4.0)
MCH: 30.1 pg (ref 26.0–34.0)
MCHC: 33.5 g/dL (ref 30.0–36.0)
MCV: 89.6 fL (ref 80.0–100.0)
Monocytes Absolute: 0.5 10*3/uL (ref 0.1–1.0)
Monocytes Relative: 9 %
Neutro Abs: 4.4 10*3/uL (ref 1.7–7.7)
Neutrophils Relative %: 70 %
Platelet Count: 363 10*3/uL (ref 150–400)
RBC: 3.66 MIL/uL — ABNORMAL LOW (ref 3.87–5.11)
RDW: 12.1 % (ref 11.5–15.5)
WBC Count: 6.2 10*3/uL (ref 4.0–10.5)
nRBC: 0 % (ref 0.0–0.2)

## 2018-10-01 MED ORDER — SODIUM CHLORIDE 0.9 % IV SOLN
10.0000 mg | Freq: Once | INTRAVENOUS | Status: AC
  Administered 2018-10-01: 15:00:00 10 mg via INTRAVENOUS
  Filled 2018-10-01: qty 1

## 2018-10-01 MED ORDER — PALONOSETRON HCL INJECTION 0.25 MG/5ML
INTRAVENOUS | Status: AC
  Filled 2018-10-01: qty 5

## 2018-10-01 MED ORDER — SODIUM CHLORIDE 0.9 % IV SOLN
Freq: Once | INTRAVENOUS | Status: AC
  Administered 2018-10-01: 15:00:00 via INTRAVENOUS
  Filled 2018-10-01: qty 250

## 2018-10-01 MED ORDER — SODIUM CHLORIDE 0.9% FLUSH
10.0000 mL | INTRAVENOUS | Status: DC | PRN
  Administered 2018-10-01: 17:00:00 10 mL
  Filled 2018-10-01: qty 10

## 2018-10-01 MED ORDER — SODIUM CHLORIDE 0.9% FLUSH
10.0000 mL | INTRAVENOUS | Status: DC | PRN
  Administered 2018-10-01: 10 mL
  Filled 2018-10-01: qty 10

## 2018-10-01 MED ORDER — DEXAMETHASONE SODIUM PHOSPHATE 10 MG/ML IJ SOLN: 10.0000 mg | Freq: Once | INTRAMUSCULAR | Status: DC

## 2018-10-01 MED ORDER — PALONOSETRON HCL INJECTION 0.25 MG/5ML
0.2500 mg | Freq: Once | INTRAVENOUS | Status: AC
  Administered 2018-10-01: 15:00:00 0.25 mg via INTRAVENOUS

## 2018-10-01 MED ORDER — FAMOTIDINE IN NACL 20-0.9 MG/50ML-% IV SOLN
20.0000 mg | Freq: Once | INTRAVENOUS | Status: AC
  Administered 2018-10-01: 15:00:00 20 mg via INTRAVENOUS

## 2018-10-01 MED ORDER — SODIUM CHLORIDE 0.9 % IV SOLN
80.0000 mg/m2 | Freq: Once | INTRAVENOUS | Status: AC
  Administered 2018-10-01: 16:00:00 168 mg via INTRAVENOUS
  Filled 2018-10-01: qty 28

## 2018-10-01 MED ORDER — FAMOTIDINE IN NACL 20-0.9 MG/50ML-% IV SOLN
INTRAVENOUS | Status: AC
  Filled 2018-10-01: qty 50

## 2018-10-01 MED ORDER — SODIUM CHLORIDE 0.9 % IV SOLN: 10.0000 mg | Freq: Once | INTRAVENOUS | Status: DC

## 2018-10-01 MED ORDER — DIPHENHYDRAMINE HCL 50 MG/ML IJ SOLN
50.0000 mg | Freq: Once | INTRAMUSCULAR | Status: AC
  Administered 2018-10-01: 50 mg via INTRAVENOUS

## 2018-10-01 MED ORDER — DIPHENHYDRAMINE HCL 50 MG/ML IJ SOLN
INTRAMUSCULAR | Status: AC
  Filled 2018-10-01: qty 1

## 2018-10-01 MED ORDER — HEPARIN SOD (PORK) LOCK FLUSH 100 UNIT/ML IV SOLN
500.0000 [IU] | Freq: Once | INTRAVENOUS | Status: AC | PRN
  Administered 2018-10-01: 17:00:00 500 [IU]
  Filled 2018-10-01: qty 5

## 2018-10-01 NOTE — Patient Instructions (Signed)
[facility] Discharge Instructions for Patients Receiving Chemotherapy  Today you received the following chemotherapy agents Paclitaxel (TAXOL).  To help prevent nausea and vomiting after your treatment, we encourage you to take your nausea medication as prescribed.   If you develop nausea and vomiting that is not controlled by your nausea medication, call the clinic.   BELOW ARE SYMPTOMS THAT SHOULD BE REPORTED IMMEDIATELY:  *FEVER GREATER THAN 100.5 F  *CHILLS WITH OR WITHOUT FEVER  NAUSEA AND VOMITING THAT IS NOT CONTROLLED WITH YOUR NAUSEA MEDICATION  *UNUSUAL SHORTNESS OF BREATH  *UNUSUAL BRUISING OR BLEEDING  TENDERNESS IN MOUTH AND THROAT WITH OR WITHOUT PRESENCE OF ULCERS  *URINARY PROBLEMS  *BOWEL PROBLEMS  UNUSUAL RASH Items with * indicate a potential emergency and should be followed up as soon as possible.  Feel free to call the clinic should you have any questions or concerns. The clinic phone number is [phone].  Please show the [facility] at check-in to the Emergency Department and triage nurse.  Goserelin injection What is this medicine? GOSERELIN (GOE se rel in) is similar to a hormone found in the body. It lowers the amount of sex hormones that the body makes. Men will have lower testosterone levels and women will have lower estrogen levels while taking this medicine. In men, this medicine is used to treat prostate cancer; the injection is either given once per month or once every 12 weeks. A once per month injection (only) is used to treat women with endometriosis, dysfunctional uterine bleeding, or advanced breast cancer. This medicine may be used for other purposes; ask your health care provider or pharmacist if you have questions. COMMON BRAND NAME(S): Zoladex What should I tell my health care provider before I take this medicine? They need to know if you have any of these conditions:  bone problems  diabetes  heart  disease  history of irregular heartbeat  an unusual or allergic reaction to goserelin, other medicines, foods, dyes, or preservatives  pregnant or trying to get pregnant  breast-feeding How should I use this medicine? This medicine is for injection under the skin. It is given by a health care professional in a hospital or clinic setting. Talk to your pediatrician regarding the use of this medicine in children. Special care may be needed. Overdosage: If you think you have taken too much of this medicine contact a poison control center or emergency room at once. NOTE: This medicine is only for you. Do not share this medicine with others. What if I miss a dose? It is important not to miss your dose. Call your doctor or health care professional if you are unable to keep an appointment. What may interact with this medicine? Do not take this medicine with any of the following medications:  cisapride  dronedarone  pimozide  thioridazine This medicine may also interact with the following medications:  other medicines that prolong the QT interval (an abnormal heart rhythm) This list may not describe all possible interactions. Give your health care provider a list of all the medicines, herbs, non-prescription drugs, or dietary supplements you use. Also tell them if you smoke, drink alcohol, or use illegal drugs. Some items may interact with your medicine. What should I watch for while using this medicine? Visit your doctor or health care provider for regular checks on your progress. Your symptoms may appear to get worse during the first weeks of this therapy. Tell your doctor or healthcare provider if your symptoms  do not start to get better or if they get worse after this time. Your bones may get weaker if you take this medicine for a long time. If you smoke or frequently drink alcohol you may increase your risk of bone loss. A family history of osteoporosis, chronic use of drugs for seizures  (convulsions), or corticosteroids can also increase your risk of bone loss. Talk to your doctor about how to keep your bones strong. This medicine should stop regular monthly menstruation in women. Tell your doctor if you continue to menstruate. Women should not become pregnant while taking this medicine or for 12 weeks after stopping this medicine. Women should inform their doctor if they wish to become pregnant or think they might be pregnant. There is a potential for serious side effects to an unborn child. Talk to your health care professional or pharmacist for more information. Do not breast-feed an infant while taking this medicine. Men should inform their doctors if they wish to father a child. This medicine may lower sperm counts. Talk to your health care professional or pharmacist for more information. This medicine may increase blood sugar. Ask your healthcare provider if changes in diet or medicines are needed if you have diabetes. What side effects may I notice from receiving this medicine? Side effects that you should report to your doctor or health care professional as soon as possible:  allergic reactions like skin rash, itching or hives, swelling of the face, lips, or tongue  bone pain  breathing problems  changes in vision  chest pain  feeling faint or lightheaded, falls  fever, chills  pain, swelling, warmth in the leg  pain, tingling, numbness in the hands or feet  signs and symptoms of high blood sugar such as being more thirsty or hungry or having to urinate more than normal. You may also feel very tired or have blurry vision  signs and symptoms of low blood pressure like dizziness; feeling faint or lightheaded, falls; unusually weak or tired  stomach pain  swelling of the ankles, feet, hands  trouble passing urine or change in the amount of urine  unusually high or low blood pressure  unusually weak or tired Side effects that usually do not require medical  attention (report to your doctor or health care professional if they continue or are bothersome):  change in sex drive or performance  changes in breast size in both males and females  changes in emotions or moods  headache  hot flashes  irritation at site where injected  loss of appetite  skin problems like acne, dry skin  vaginal dryness This list may not describe all possible side effects. Call your doctor for medical advice about side effects. You may report side effects to FDA at 1-800-FDA-1088. Where should I keep my medicine? This drug is given in a hospital or clinic and will not be stored at home. NOTE: This sheet is a summary. It may not cover all possible information. If you have questions about this medicine, talk to your doctor, pharmacist, or health care provider.  2020 Elsevier/Gold Standard (2018-06-10 14:05:56)   Coronavirus (COVID-19) Are you at risk?  Are you at risk for the Coronavirus (COVID-19)?  To be considered HIGH RISK for Coronavirus (COVID-19), you have to meet the following criteria:  . Traveled to [place], [place], [place], [place] or [place]; or in the [place] to [place], [place], [place], or [place]; and have fever, cough, and shortness of breath within the  last 2 weeks of travel OR . Been in close contact with a person diagnosed with COVID-19 within the last 2 weeks and have fever, cough, and shortness of breath . IF YOU DO NOT MEET THESE CRITERIA, YOU ARE CONSIDERED LOW RISK FOR COVID-19.  What to do if you are HIGH RISK for COVID-19?  Marland Kitchen If you are having a medical emergency, call 911. . Seek medical care right away. Before you go to a doctor's office, urgent care or emergency department, call ahead and tell them about your recent travel, contact with someone diagnosed with COVID-19, and your symptoms. You should receive instructions from your physician's office regarding next steps of care.  . When you arrive at healthcare  provider, tell the healthcare staff immediately you have returned from visiting [place], [place], [place], [place] or [place]; or traveled in the [place] to [place], [place], [place], or [place]; in the last two weeks or you have been in close contact with a person diagnosed with COVID-19 in the last 2 weeks.   . Tell the health care staff about your symptoms: fever, cough and shortness of breath. . After you have been seen by a medical provider, you will be either: o Tested for (COVID-19) and discharged home on quarantine except to seek medical care if symptoms worsen, and asked to  - Stay home and avoid contact with others until you get your results (4-5 days)  - Avoid travel on public transportation if possible (such as bus, train, or airplane) or o Sent to the Emergency Department by EMS for evaluation, COVID-19 testing, and possible admission depending on your condition and test results.  What to do if you are LOW RISK for COVID-19?  Reduce your risk of any infection by using the same precautions used for avoiding the common cold or flu:  Marland Kitchen Wash your hands often with soap and warm water for at least 20 seconds.  If soap and water are not readily available, use an alcohol-based hand sanitizer with at least 60% alcohol.  . If coughing or sneezing, cover your mouth and nose by coughing or sneezing into the elbow areas of your shirt or coat, into a tissue or into your sleeve (not your hands). . Avoid shaking hands with others and consider head nods or verbal greetings only. . Avoid touching your eyes, nose, or mouth with unwashed hands.  . Avoid close contact with people who are sick. . Avoid places or events with large numbers of people in one location, like concerts or sporting events. . Carefully consider travel plans you have or are making. . If you are planning any travel outside or inside the [place], visit the CDC's Travelers' Health webpage for the latest health notices. . If you  have some symptoms but not all symptoms, continue to monitor at home and seek medical attention if your symptoms worsen. . If you are having a medical emergency, call 911.   [place] / e-Visit: [email or web address]         [facility]: [facility]: [phone]                   [facility]: [phone]

## 2018-10-01 NOTE — Telephone Encounter (Signed)
Copied from [place] [id number]. Topic: General - Other >> Oct 01, 2018  8:54 AM Jodie Echevaria wrote: Reason for CRM: Sarah with [facility] called to speak to someone about insurance for this patient. She can be reached at Ph# [phone]

## 2018-10-01 NOTE — Assessment & Plan Note (Signed)
05/13/2018:Evaluation of 5 months of thickening of the left breast upper outer quadrant. 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 Several lymph nodes were identified by ultrasound. T2 N1 stage IIb clinical stage  Treatment plan: 1.Neoadjuvant chemotherapy with dose dense Adriamycin and Cytoxan followed by Taxol weekly x12 2.Breast conserving surgery with targeted node dissection if possible 3.Adjuvant radiation therapy 4.Followed by adjuvant antiestrogen therapy(complete estrogen blockade with Zoladex plus aromatase inhibitor versus oophorectomy) ---------------------------------------------------------------------- Current treatment:Completed 4 cycles ofdose dense Adriamycin and Cytoxan, today cycle12of Taxol Chemo toxicities: Denies any nausea or vomiting. Complete loss of hair Mild fatigue Mild anemia Monitoring closely for peripheral neuropathy--none at this point  Breast MRI has been ordered, we will do a Doximity video visit to discuss the MRI Patient be discussed in tumor board She has an appointment to see Dr. [name]

## 2018-10-04 NOTE — Assessment & Plan Note (Signed)
05/13/2018:Evaluation of 5 months of thickening of the left breast upper outer quadrant. 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 Several lymph nodes were identified by ultrasound. T2 N1 stage IIb clinical stage  Treatment plan: 1.Neoadjuvant chemotherapy with dose dense Adriamycin and Cytoxan followed by Taxol weekly x12 2.Breast conserving surgery with targeted node dissection if possible 3.Adjuvant radiation therapy 4.Followed by adjuvant antiestrogen therapy(complete estrogen blockade with Zoladex plus aromatase inhibitor versus oophorectomy) ----------------------------------------------------------------------  

## 2018-10-04 NOTE — Telephone Encounter (Signed)
Left detailed message informing Bethany Hanna to return phone call.

## 2018-10-07 ENCOUNTER — Ambulatory Visit
Admission: RE | Admit: 2018-10-07 | Discharge: 2018-10-07 | Disposition: A | Discharge status: Home / Self Care | Payer: 59 | Source: Ambulatory Visit | Attending: Hematology and Oncology | Admitting: Hematology and Oncology

## 2018-10-07 DIAGNOSIS — Z170 Estrogen receptor positive status [ER+]: Secondary | ICD-10-CM

## 2018-10-07 DIAGNOSIS — C50412 Malignant neoplasm of upper-outer quadrant of left female breast: Secondary | ICD-10-CM

## 2018-10-07 MED ORDER — GADOBUTROL 1 MMOL/ML IV SOLN
10.0000 mL | Freq: Once | INTRAVENOUS | Status: AC | PRN
  Administered 2018-10-07: 10 mL via INTRAVENOUS

## 2018-10-07 NOTE — Progress Notes (Signed)
Patient Care Team: [name], MD as PCP - General (Family Medicine) Mauro Kaufmann, RN as Oncology Nurse Navigator Rockwell Germany, RN as Oncology Nurse Navigator  DIAGNOSIS:    ICD-10-CM   1. Malignant neoplasm of upper-outer quadrant of left breast in female, estrogen receptor positive ([place])  C50.412    Z17.0     SUMMARY OF ONCOLOGIC HISTORY: Oncology History  Malignant neoplasm of upper-outer quadrant of left breast in female, estrogen receptor positive ([facility] [place])  05/13/2018 Initial Diagnosis   Evaluation of 5 months of thickening of the left breast upper outer quadrant.  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  Several lymph nodes were identified by ultrasound.  T2 N1 stage IIb clinical stage   05/16/2018 Cancer Staging   Staging form: Breast, AJCC 8th Edition - Clinical stage from 05/16/2018: Stage IIB (cT2, cN1, cM0, G3, ER+, PR+, HER2-) - Signed by [name], MD on 05/16/2018   05/28/2018 -  Neo-Adjuvant Chemotherapy   Neoadjuvant chemotherapy with dose dense Adriamycin and Cytoxan followed by Taxol weekly x12   08/16/2018 Genetic Testing   Negative genetic testing on the multi-cancer panel.  The Multi-Gene Panel offered by [facility] includes sequencing and/or deletion duplication testing of the following 85 genes: AIP, ALK, APC, ATM, AXIN2,BAP1,  BARD1, BLM, BMPR1A, BRCA1, BRCA2, BRIP1, CASR, CDC73, CDH1, CDK4, CDKN1B, CDKN1C, CDKN2A (p14ARF), CDKN2A (p16INK4a), CEBPA, CHEK2, CTNNA1, DICER1, DIS3L2, EGFR (c.2369C>T, p.Thr790Met variant only), EPCAM (Deletion/duplication testing only), FH, FLCN, GATA2, GPC3, GREM1 (Promoter region deletion/duplication testing only), HOXB13 (c.251G>A, p.Gly84Glu), HRAS, KIT, MAX, MEN1, MET, MITF  (c.952G>A, p.Glu318Lys variant only), MLH1, MSH2, MSH3, MSH6, MUTYH, NBN, NF1, NF2, NTHL1, PALB2, PDGFRA, PHOX2B, PMS2, POLD1, POLE, POT1, PRKAR1A, PTCH1, PTEN, RAD50, RAD51C, RAD51D, RB1, RECQL4, RET, RNF43, RUNX1, SDHAF2, SDHA (sequence changes only), SDHB, SDHC, SDHD, SMAD4, SMARCA4, SMARCB1, SMARCE1, STK11, SUFU, TERC, TERT, TMEM127, TP53, TSC1, TSC2, VHL, WRN and WT1.  The report date is August 16, 2018.     CHIEF COMPLIANT: Cycle 12 Taxol  INTERVAL HISTORY: Bethany Hanna is a 38 y.o. with above-mentioned history of left breastcancerwho completed 4 cycles ofneoadjuvant chemotherapy with dose dense Adriamycin and Cytoxanandis currently receivingweekly Taxol treatments.She presents to the clinic todayfor cycle12.   REVIEW OF SYSTEMS:   Constitutional: Denies fevers, chills or abnormal weight loss Eyes: Denies blurriness of vision Ears, nose, mouth, throat, and face: Denies mucositis or sore throat Respiratory: Denies cough, dyspnea or wheezes Cardiovascular: Denies palpitation, chest discomfort Gastrointestinal: Denies nausea, heartburn or change in bowel habits Skin: Denies abnormal skin rashes Lymphatics: Denies new lymphadenopathy or easy bruising Neurological: Denies numbness, tingling or new weaknesses Behavioral/Psych: Mood is stable, no new changes  Extremities: No lower extremity edema Breast: denies any pain or lumps or nodules in either breasts All other systems were reviewed with the patient and are negative.  I have reviewed the past medical history, past surgical history, social history and family history with the patient and they are unchanged from previous note.  ALLERGIES:  has No Known Allergies.  MEDICATIONS:  Current Outpatient Medications  Medication Sig Dispense Refill   acetaminophen (TYLENOL) 500 MG tablet Take 1,000 mg by mouth every 6 (six) hours as needed for moderate pain.      ALPRAZolam (XANAX) 0.5 MG tablet Take 1 tablet (  0.5 mg total) by  mouth at bedtime as needed for anxiety. 60 tablet 3   folic acid (FOLVITE) 1 MG tablet Take 1 mg by mouth daily.     lidocaine-prilocaine (EMLA) cream Apply to affected area once 30 g 3   LORazepam (ATIVAN) 0.5 MG tablet TAKE 1 TABLET(0.5 MG) BY MOUTH AT BEDTIME AS NEEDED FOR NAUSEA OR VOMITING 30 tablet 0   mupirocin ointment (BACTROBAN) 2 % Apply to area 3 times daily until healed 30 g 0   ondansetron (ZOFRAN) 8 MG tablet Take 1 tablet (8 mg total) by mouth 2 (two) times daily as needed. Start on the third day after chemotherapy. 30 tablet 1   prochlorperazine (COMPAZINE) 10 MG tablet Take 1 tablet (10 mg total) by mouth every 6 (six) hours as needed (Nausea or vomiting). 30 tablet 1   traMADol (ULTRAM) 50 MG tablet Take 50 mg by mouth every 6 (six) hours as needed for moderate pain.     No current facility-administered medications for this visit.     PHYSICAL EXAMINATION: ECOG PERFORMANCE STATUS: 1 - Symptomatic but completely ambulatory  Vitals:   10/08/18 1327  BP: (!) 152/89  Pulse: (!) 125  Resp: 20  Temp: 98.1 F (36.7 C)  SpO2: 100%   Filed Weights   10/08/18 1327  Weight: 208 lb (94.3 kg)    GENERAL: alert, no distress and comfortable SKIN: skin color, texture, turgor are normal, no rashes or significant lesions EYES: normal, Conjunctiva are pink and non-injected, sclera clear OROPHARYNX: no exudate, no erythema and lips, buccal mucosa, and tongue normal  NECK: supple, thyroid normal size, non-tender, without nodularity LYMPH: no palpable lymphadenopathy in the cervical, axillary or inguinal LUNGS: clear to auscultation and percussion with normal breathing effort HEART: regular rate & rhythm and no murmurs and no lower extremity edema ABDOMEN: abdomen soft, non-tender and normal bowel sounds MUSCULOSKELETAL: no cyanosis of digits and no clubbing  NEURO: alert & oriented x 3 with fluent speech, no focal motor/sensory deficits EXTREMITIES: No lower extremity  edema  LABORATORY DATA:  I have reviewed the data as listed CMP Latest Ref Rng & Units 10/01/2018 09/24/2018 09/17/2018  Glucose 70 - 99 mg/dL 94 109(H) 138(H)  BUN 6 - 20 mg/dL _0 Creatinine 0.44 - 1.00 mg/dL 0.64 0.72 0.71  Sodium 135 - 145 mmol/L 139 139 141  Potassium 3.5 - 5.1 mmol/L 3.8 3.8 3.5  Chloride 98 - 111 mmol/L 106 106 106  CO2 22 - 32 mmol/L _1 Calcium 8.9 - 10.3 mg/dL 9.3 9.0 10.1  Total Protein 6.5 - 8.1 g/dL 6.9 7.0 7.1  Total Bilirubin 0.3 - 1.2 mg/dL 0.2(L) 0.3 0.3  Alkaline Phos 38 - 126 U/L 86 60 66  AST 15 - 41 U/L 19 14(L) 12(L)  ALT 0 - 44 U/L 49(H) 14 18    Lab Results  Component Value Date   WBC 8.8 10/08/2018   HGB 11.7 (L) 10/08/2018   HCT 34.9 (L) 10/08/2018   MCV 90.2 10/08/2018   PLT 401 (H) 10/08/2018   NEUTROABS 6.9 10/08/2018    ASSESSMENT & PLAN:  Malignant neoplasm of upper-outer quadrant of left breast in female, estrogen receptor positive ([facility]) 05/13/2018:Evaluation of 5 months of thickening of the left breast upper outer quadrant. Initial mammogram revealed 2 cm irregular mass, ultrasound revealed 4 cm irregular mass with spiculated margin 2 o'clock position middle depth biopsy revealed invasive mammary carcinoma grade 3 that was ER PR positive  HER-2 negative, left axillary lymph node biopsy positive for breast cancer. Several lymph nodes were identified by ultrasound. T2 N1 stage IIb clinical stage  Treatment plan: 1.Neoadjuvant chemotherapy with dose dense Adriamycin and Cytoxan followed by Taxol weekly x12 2.Breast conserving surgery with targeted node dissection if possible 3.Adjuvant radiation therapy 4.Followed by adjuvant antiestrogen therapy(complete estrogen blockade with Zoladex plus aromatase inhibitor versus oophorectomy) ---------------------------------------------------------------------- Current treatment:Completed 4 cycles ofdose dense Adriamycin and Cytoxan, today cycle12of Taxol Chemo  toxicities: Denies any nausea or vomiting. Complete loss of hair Mild fatigue Mild anemia Monitoring closely for peripheral neuropathy--none at this point  Breast MRI 10/07/2018:Significant improvement of left breast malignancy.  Non-mass enhancement 3.7 cm.  Significant improvement in the left axillary lymph nodes previously there were 6 nodes and currently there was a single lymph node with mild cortical thickening.  Patient be discussed in tumor board Surgery has been planned for 11/05/2018 She will be scheduled to get monthly Zoladex injections. I will do a Doximity video visit 1 week after surgery to discuss final pathology report.  No orders of the defined types were placed in this encounter.  The patient has a good understanding of the overall plan. she agrees with it. she will call with any problems that may develop before the next visit here.  [name], MD 10/08/2018  Julious Oka Dorshimer am acting as scribe for Dr. [name].  I have reviewed the above documentation for accuracy and completeness, and I agree with the above.

## 2018-10-08 ENCOUNTER — Inpatient Hospital Stay: Payer: 59

## 2018-10-08 ENCOUNTER — Telehealth: Payer: Self-pay | Admitting: *Deleted

## 2018-10-08 ENCOUNTER — Inpatient Hospital Stay: Payer: 59 | Attending: Hematology and Oncology

## 2018-10-08 ENCOUNTER — Inpatient Hospital Stay (HOSPITAL_BASED_OUTPATIENT_CLINIC_OR_DEPARTMENT_OTHER): Payer: 59 | Admitting: Hematology and Oncology

## 2018-10-08 ENCOUNTER — Other Ambulatory Visit: Payer: Self-pay

## 2018-10-08 VITALS — HR 104

## 2018-10-08 DIAGNOSIS — Z95828 Presence of other vascular implants and grafts: Secondary | ICD-10-CM

## 2018-10-08 DIAGNOSIS — D6481 Anemia due to antineoplastic chemotherapy: Secondary | ICD-10-CM | POA: Diagnosis not present

## 2018-10-08 DIAGNOSIS — C50412 Malignant neoplasm of upper-outer quadrant of left female breast: Secondary | ICD-10-CM | POA: Insufficient documentation

## 2018-10-08 DIAGNOSIS — Z170 Estrogen receptor positive status [ER+]: Secondary | ICD-10-CM

## 2018-10-08 DIAGNOSIS — L658 Other specified nonscarring hair loss: Secondary | ICD-10-CM | POA: Diagnosis not present

## 2018-10-08 DIAGNOSIS — Z79899 Other long term (current) drug therapy: Secondary | ICD-10-CM | POA: Insufficient documentation

## 2018-10-08 DIAGNOSIS — T451X5A Adverse effect of antineoplastic and immunosuppressive drugs, initial encounter: Secondary | ICD-10-CM | POA: Insufficient documentation

## 2018-10-08 DIAGNOSIS — R5383 Other fatigue: Secondary | ICD-10-CM | POA: Insufficient documentation

## 2018-10-08 DIAGNOSIS — Z5111 Encounter for antineoplastic chemotherapy: Secondary | ICD-10-CM | POA: Diagnosis present

## 2018-10-08 LAB — CBC WITH DIFFERENTIAL (CANCER CENTER ONLY)
Abs Immature Granulocytes: 0.06 10*3/uL (ref 0.00–0.07)
Basophils Absolute: 0 10*3/uL (ref 0.0–0.1)
Basophils Relative: 1 %
Eosinophils Absolute: 0.1 10*3/uL (ref 0.0–0.5)
Eosinophils Relative: 1 %
HCT: 34.9 % — ABNORMAL LOW (ref 36.0–46.0)
Hemoglobin: 11.7 g/dL — ABNORMAL LOW (ref 12.0–15.0)
Immature Granulocytes: 1 %
Lymphocytes Relative: 13 %
Lymphs Abs: 1.2 10*3/uL (ref 0.7–4.0)
MCH: 30.2 pg (ref 26.0–34.0)
MCHC: 33.5 g/dL (ref 30.0–36.0)
MCV: 90.2 fL (ref 80.0–100.0)
Monocytes Absolute: 0.5 10*3/uL (ref 0.1–1.0)
Monocytes Relative: 6 %
Neutro Abs: 6.9 10*3/uL (ref 1.7–7.7)
Neutrophils Relative %: 78 %
Platelet Count: 401 10*3/uL — ABNORMAL HIGH (ref 150–400)
RBC: 3.87 MIL/uL (ref 3.87–5.11)
RDW: 12.3 % (ref 11.5–15.5)
WBC Count: 8.8 10*3/uL (ref 4.0–10.5)
nRBC: 0 % (ref 0.0–0.2)

## 2018-10-08 LAB — CMP (CANCER CENTER ONLY)
ALT: 26 U/L (ref 0–44)
AST: 16 U/L (ref 15–41)
Albumin: 4.1 g/dL (ref 3.5–5.0)
Alkaline Phosphatase: 72 U/L (ref 38–126)
Anion gap: 10 (ref 5–15)
BUN: 8 mg/dL (ref 6–20)
CO2: 25 mmol/L (ref 22–32)
Calcium: 9.5 mg/dL (ref 8.9–10.3)
Chloride: 104 mmol/L (ref 98–111)
Creatinine: 0.74 mg/dL (ref 0.44–1.00)
GFR, Est AFR Am: >60 mL/min (ref >60)
GFR, Estimated: >60 mL/min (ref >60)
Glucose, Bld: 98 mg/dL (ref 70–99)
Potassium: 3.9 mmol/L (ref 3.5–5.1)
Sodium: 139 mmol/L (ref 135–145)
Total Bilirubin: 0.3 mg/dL (ref 0.3–1.2)
Total Protein: 7.3 g/dL (ref 6.5–8.1)

## 2018-10-08 LAB — PREGNANCY, URINE: Preg Test, Ur: NEGATIVE

## 2018-10-08 MED ORDER — SODIUM CHLORIDE 0.9 % IV SOLN
80.0000 mg/m2 | Freq: Once | INTRAVENOUS | Status: AC
  Administered 2018-10-08: 168 mg via INTRAVENOUS
  Filled 2018-10-08: qty 28

## 2018-10-08 MED ORDER — SODIUM CHLORIDE 0.9% FLUSH
10.0000 mL | INTRAVENOUS | Status: DC | PRN
  Administered 2018-10-08: 10 mL
  Filled 2018-10-08: qty 10

## 2018-10-08 MED ORDER — HEPARIN SOD (PORK) LOCK FLUSH 100 UNIT/ML IV SOLN
500.0000 [IU] | Freq: Once | INTRAVENOUS | Status: AC | PRN
  Administered 2018-10-08: 17:00:00 500 [IU]
  Filled 2018-10-08: qty 5

## 2018-10-08 MED ORDER — PALONOSETRON HCL INJECTION 0.25 MG/5ML
INTRAVENOUS | Status: AC
  Filled 2018-10-08: qty 5

## 2018-10-08 MED ORDER — DIPHENHYDRAMINE HCL 50 MG/ML IJ SOLN
INTRAMUSCULAR | Status: AC
  Filled 2018-10-08: qty 1

## 2018-10-08 MED ORDER — FAMOTIDINE IN NACL 20-0.9 MG/50ML-% IV SOLN
20.0000 mg | Freq: Once | INTRAVENOUS | Status: AC
  Administered 2018-10-08: 20 mg via INTRAVENOUS

## 2018-10-08 MED ORDER — GOSERELIN ACETATE 3.6 MG ~~LOC~~ IMPL
3.6000 mg | DRUG_IMPLANT | Freq: Once | SUBCUTANEOUS | Status: AC
  Administered 2018-10-08: 3.6 mg via SUBCUTANEOUS

## 2018-10-08 MED ORDER — SODIUM CHLORIDE 0.9 % IV SOLN
10.0000 mg | Freq: Once | INTRAVENOUS | Status: AC
  Administered 2018-10-08: 10 mg via INTRAVENOUS
  Filled 2018-10-08: qty 1

## 2018-10-08 MED ORDER — PALONOSETRON HCL INJECTION 0.25 MG/5ML
0.2500 mg | Freq: Once | INTRAVENOUS | Status: AC
  Administered 2018-10-08: 0.25 mg via INTRAVENOUS

## 2018-10-08 MED ORDER — GOSERELIN ACETATE 3.6 MG ~~LOC~~ IMPL
DRUG_IMPLANT | SUBCUTANEOUS | Status: AC
  Filled 2018-10-08: qty 3.6

## 2018-10-08 MED ORDER — DIPHENHYDRAMINE HCL 50 MG/ML IJ SOLN
50.0000 mg | Freq: Once | INTRAMUSCULAR | Status: AC
  Administered 2018-10-08: 50 mg via INTRAVENOUS

## 2018-10-08 MED ORDER — FAMOTIDINE IN NACL 20-0.9 MG/50ML-% IV SOLN
INTRAVENOUS | Status: AC
  Filled 2018-10-08: qty 50

## 2018-10-08 MED ORDER — SODIUM CHLORIDE 0.9 % IV SOLN
Freq: Once | INTRAVENOUS | Status: AC
  Administered 2018-10-08: 15:00:00 via INTRAVENOUS
  Filled 2018-10-08: qty 250

## 2018-10-08 NOTE — Progress Notes (Signed)
Okay to administer Zoladex today, 10/08/2018, per Dr. Lindi Adie.  Infusion nurse notified.

## 2018-10-08 NOTE — Patient Instructions (Signed)
[facility] Discharge Instructions for Patients Receiving Chemotherapy  Today you received the following chemotherapy agents:  Taxol.  To help prevent nausea and vomiting after your treatment, we encourage you to take your nausea medication as directed.   If you develop nausea and vomiting that is not controlled by your nausea medication, call the clinic.   BELOW ARE SYMPTOMS THAT SHOULD BE REPORTED IMMEDIATELY:  *FEVER GREATER THAN 100.5 F  *CHILLS WITH OR WITHOUT FEVER  NAUSEA AND VOMITING THAT IS NOT CONTROLLED WITH YOUR NAUSEA MEDICATION  *UNUSUAL SHORTNESS OF BREATH  *UNUSUAL BRUISING OR BLEEDING  TENDERNESS IN MOUTH AND THROAT WITH OR WITHOUT PRESENCE OF ULCERS  *URINARY PROBLEMS  *BOWEL PROBLEMS  UNUSUAL RASH Items with * indicate a potential emergency and should be followed up as soon as possible.  Feel free to call the clinic should you have any questions or concerns. The clinic phone number is [phone].  Please show the CHEMO ALERT CARD at check-in to the Emergency Department and triage nurse.   

## 2018-10-08 NOTE — Telephone Encounter (Signed)
Called pt to congratulate on final chemo today. Discussed next steps of sx followed by post op with Dr. Lindi Hanna then xrt. Denies further needs or questions.

## 2018-10-08 NOTE — Progress Notes (Signed)
HR 104 reported to Dr. Lindi Adie. Received OK to treat.

## 2018-10-08 NOTE — Telephone Encounter (Signed)
Spoke to [name] and she stated she reached out to [employer] for the information needed. [name] does not need anything else.

## 2018-10-09 ENCOUNTER — Telehealth: Payer: Self-pay | Admitting: Hematology and Oncology

## 2018-10-09 NOTE — Telephone Encounter (Signed)
I could not reach patient regarding schedule  °

## 2018-10-10 ENCOUNTER — Encounter: Payer: Self-pay | Admitting: *Deleted

## 2018-10-10 ENCOUNTER — Other Ambulatory Visit: Payer: Self-pay | Admitting: *Deleted

## 2018-10-10 DIAGNOSIS — C50412 Malignant neoplasm of upper-outer quadrant of left female breast: Secondary | ICD-10-CM

## 2018-10-10 DIAGNOSIS — Z170 Estrogen receptor positive status [ER+]: Secondary | ICD-10-CM

## 2018-10-10 MED ORDER — LORAZEPAM 0.5 MG PO TABS: ORAL_TABLET | ORAL | 0 refills | Status: DC

## 2018-10-10 NOTE — Progress Notes (Signed)
Canceling the appointment from this morning. This encounter was created in error - please disregard.

## 2018-10-11 ENCOUNTER — Telehealth: Payer: Self-pay | Admitting: Hematology

## 2018-10-11 ENCOUNTER — Inpatient Hospital Stay: Payer: 59 | Admitting: Hematology and Oncology

## 2018-10-11 DIAGNOSIS — C50412 Malignant neoplasm of upper-outer quadrant of left female breast: Secondary | ICD-10-CM

## 2018-10-11 NOTE — Telephone Encounter (Signed)
No 8/7 los, orders, referrals.

## 2018-10-14 ENCOUNTER — Other Ambulatory Visit: Payer: Self-pay | Admitting: General Surgery

## 2018-10-14 DIAGNOSIS — C50412 Malignant neoplasm of upper-outer quadrant of left female breast: Secondary | ICD-10-CM

## 2018-10-18 ENCOUNTER — Other Ambulatory Visit: Payer: 59

## 2018-10-23 ENCOUNTER — Encounter: Payer: Self-pay | Admitting: Hematology and Oncology

## 2018-10-24 ENCOUNTER — Other Ambulatory Visit: Payer: Self-pay | Admitting: General Surgery

## 2018-10-24 DIAGNOSIS — C50412 Malignant neoplasm of upper-outer quadrant of left female breast: Secondary | ICD-10-CM

## 2018-10-24 DIAGNOSIS — Z170 Estrogen receptor positive status [ER+]: Secondary | ICD-10-CM

## 2018-10-25 ENCOUNTER — Other Ambulatory Visit: Payer: Self-pay | Admitting: General Surgery

## 2018-10-25 DIAGNOSIS — C50412 Malignant neoplasm of upper-outer quadrant of left female breast: Secondary | ICD-10-CM

## 2018-10-25 DIAGNOSIS — Z170 Estrogen receptor positive status [ER+]: Secondary | ICD-10-CM

## 2018-10-28 ENCOUNTER — Other Ambulatory Visit: Payer: Self-pay | Admitting: *Deleted

## 2018-10-28 MED ORDER — ALPRAZOLAM 0.5 MG PO TABS: 0.5000 mg | ORAL_TABLET | Freq: Every evening | ORAL | 3 refills | Status: DC | PRN

## 2018-10-30 NOTE — Progress Notes (Signed)
[facility] [place], [place] AT [address] [place] [address] Phone: [phone] Fax: [phone]    Your procedure is scheduled on Tuesday, September 1st.  Report to [facility] Main Entrance "A" at 10:30 A.M., and check in at the Admitting office.  Call this number if you have problems the morning of surgery:  [phone]  Call [phone] if you have any questions prior to your surgery date Monday-Friday 8am-4pm   Remember:  Do not eat after midnight the night before your surgery  You may drink clear liquids until 9:30 the morning of your surgery.   Clear liquids allowed are: Water, Non-Citrus Juices (without pulp), Carbonated Beverages, Clear Tea, Black Coffee Only, and Gatorade   Please complete your PRE-SURGERY ENSURE that was provided to you by 9:30 the morning of surgery.  Please, if able, drink it in one setting. DO NOT SIP.   Take these medicines the morning of surgery with A SIP OF WATER   If needed - ALPRAZolam Duanne Moron)   As of today, STOP taking any Aspirin (unless otherwise instructed by your surgeon), Aleve, Naproxen, Ibuprofen, Motrin, Advil, Goody's, BC's, all herbal medications, fish oil, and all vitamins.   The Morning of Surgery  Do not wear jewelry, make-up or nail polish.  Do not wear lotions, powders, or perfumes/colognes, or deodorant  Do not shave 48 hours prior to surgery.    Do not bring valuables to the hospital.  [facility] is not responsible for any belongings or valuables.  If you are a smoker, DO NOT Smoke 24 hours prior to surgery IF you wear a CPAP at night please bring your mask, tubing, and machine the morning of surgery   Remember that you must have someone to transport you home after your surgery, and remain with you for 24 hours if you are discharged the same day.  Contacts, glasses, hearing aids, dentures or bridgework may not be worn into surgery.    Leave your suitcase in the car.  After surgery it may be brought to your room.  For patients admitted to the hospital, discharge time will be determined by your treatment team.  Patients discharged the day of surgery will not be allowed to drive home.   Special instructions:   [place]- Preparing For Surgery  Before surgery, you can play an important role. Because skin is not sterile, your skin needs to be as free of germs as possible. You can reduce the number of germs on your skin by washing with CHG (chlorahexidine gluconate) Soap before surgery.  CHG is an antiseptic cleaner which kills germs and bonds with the skin to continue killing germs even after washing.    Oral Hygiene is also important to reduce your risk of infection.  Remember - BRUSH YOUR TEETH THE MORNING OF SURGERY WITH YOUR REGULAR TOOTHPASTE  Please do not use if you have an allergy to CHG or antibacterial soaps. If your skin becomes reddened/irritated stop using the CHG.  Do not shave (including legs and underarms) for at least 48 hours prior to first CHG shower. It is OK to shave your face.  Please follow these instructions carefully.   1. Shower the NIGHT BEFORE SURGERY and the MORNING OF SURGERY with CHG Soap.   2. If you chose to wash your hair, wash your hair first as usual with your normal shampoo.  3. After you shampoo, rinse your hair and body thoroughly  to remove the shampoo.  4. Use CHG as you would any other liquid soap. You can apply CHG directly to the skin and wash gently with a scrungie or a clean washcloth.   5. Apply the CHG Soap to your body ONLY FROM THE NECK DOWN.  Do not use on open wounds or open sores. Avoid contact with your eyes, ears, mouth and genitals (private parts). Wash Face and genitals (private parts)  with your normal soap.   6. Wash thoroughly, paying special attention to the area where your surgery will be performed.  7. Thoroughly rinse your body with warm water from the  neck down.  8. DO NOT shower/wash with your normal soap after using and rinsing off the CHG Soap.  9. Pat yourself dry with a CLEAN TOWEL.  10. Wear CLEAN PAJAMAS to bed the night before surgery, wear comfortable clothes the morning of surgery  11. Place CLEAN SHEETS on your bed the night of your first shower and DO NOT SLEEP WITH PETS.  Day of Surgery: Do not apply any deodorants/lotions. Please shower the morning of surgery with the CHG soap  Please wear clean clothes to the hospital/surgery center.   Remember to brush your teeth WITH YOUR REGULAR TOOTHPASTE.  Please read over the following fact sheets that you were given.

## 2018-10-31 ENCOUNTER — Other Ambulatory Visit: Payer: Self-pay

## 2018-10-31 ENCOUNTER — Telehealth: Payer: Self-pay | Admitting: Hematology and Oncology

## 2018-10-31 ENCOUNTER — Encounter (HOSPITAL_COMMUNITY): Payer: Self-pay

## 2018-10-31 ENCOUNTER — Encounter (HOSPITAL_COMMUNITY)
Admission: RE | Admit: 2018-10-31 | Discharge: 2018-10-31 | Disposition: A | Discharge status: Home / Self Care | Payer: 59 | Source: Ambulatory Visit | Attending: General Surgery | Admitting: General Surgery

## 2018-10-31 DIAGNOSIS — Z20828 Contact with and (suspected) exposure to other viral communicable diseases: Secondary | ICD-10-CM | POA: Diagnosis not present

## 2018-10-31 DIAGNOSIS — C50912 Malignant neoplasm of unspecified site of left female breast: Secondary | ICD-10-CM | POA: Diagnosis not present

## 2018-10-31 DIAGNOSIS — Z01818 Encounter for other preprocedural examination: Secondary | ICD-10-CM | POA: Insufficient documentation

## 2018-10-31 HISTORY — DX: Anxiety disorder, unspecified: F41.9

## 2018-10-31 HISTORY — DX: Essential (primary) hypertension: I10

## 2018-10-31 LAB — BASIC METABOLIC PANEL
Anion gap: 11 (ref 5–15)
BUN: 9 mg/dL (ref 6–20)
CO2: 23 mmol/L (ref 22–32)
Calcium: 9.6 mg/dL (ref 8.9–10.3)
Chloride: 103 mmol/L (ref 98–111)
Creatinine, Ser: 0.65 mg/dL (ref 0.44–1.00)
GFR calc Af Amer: >60 mL/min (ref >60)
GFR calc non Af Amer: >60 mL/min (ref >60)
Glucose, Bld: 105 mg/dL — ABNORMAL HIGH (ref 70–99)
Potassium: 3.9 mmol/L (ref 3.5–5.1)
Sodium: 137 mmol/L (ref 135–145)

## 2018-10-31 LAB — CBC
HCT: 40.3 % (ref 36.0–46.0)
Hemoglobin: 13 g/dL (ref 12.0–15.0)
MCH: 29.6 pg (ref 26.0–34.0)
MCHC: 32.3 g/dL (ref 30.0–36.0)
MCV: 91.8 fL (ref 80.0–100.0)
Platelets: 385 10*3/uL (ref 150–400)
RBC: 4.39 MIL/uL (ref 3.87–5.11)
RDW: 13.3 % (ref 11.5–15.5)
WBC: 10.8 10*3/uL — ABNORMAL HIGH (ref 4.0–10.5)
nRBC: 0 % (ref 0.0–0.2)

## 2018-10-31 LAB — POCT PREGNANCY, URINE: Preg Test, Ur: NEGATIVE

## 2018-10-31 NOTE — Telephone Encounter (Signed)
R/s appt per 8/26 sch message  - left message for pt with new appt time

## 2018-10-31 NOTE — Progress Notes (Addendum)
PCP - [facility] [place] Cardiologist - n/a  Chest x-ray -  EKG - today Stress Test - n/a ECHO -05/2018  Cardiac Cath -n/a   Sleep Study - n/a CPAP -   Fasting Blood Sugar - n/a Checks Blood Sugar __0___ times a day  Blood Thinner Instructions:n/a Aspirin Instructions:n/a  Anesthesia review:call to A. Zelenak,PA-C, to review BP's & pulse rate. Pt. Reports white coat syndrome, she was elevated at PCP visit- she takes BP at her place of em[ploymemt but not in the past several months. Pt. Took xanax this a.m.  Pt. Has appt. On 8/31- for seed implant , on 9/1 has Zoladex injection prior to surgery.   Recheck on manual BP- 138/92, pulse-98 on EKG   Patient denies shortness of breath, fever, cough and chest pain at PAT appointment   Patient verbalized understanding of instructions that were given to them at the PAT appointment. Patient was also instructed that they will need to review over the PAT instructions again at home before surgery.  Addedum: report to A,. Zelenak,PA-C, pt. To be encouraged to take xanax a.m. of seed implant & also day of surgery. Pt. Will be instructed to get portable BP cuff to track BP at home, same time & circumstances q day at home

## 2018-10-31 NOTE — Progress Notes (Signed)
Anesthesia Chart Review:  Case: [id number] Date/Time: 11/05/18 1215   Procedures:      LEFT BREAST LUMPECTOMY WITH BRACKETED RADIOACTIVE SEED AND LEFT AXILLARY LYMPH NODE DISSECTION (Left Breast)     REMOVAL PORT-A-CATH (N/A )   Anesthesia type: General   Pre-op diagnosis: left breast cancer   Location: [facility] OR ROOM 02 / [place] [facility]   Surgeon: [name], MD    RSL scheduled for 11/04/18 at 10:45 AM.   DISCUSSION: Patient is a 38 year old female scheduled for the above procedure.   History includes never smoker, left breast cancer (invasive mammary carcinoma 3/9/920; s/p chemotherapy), HTN (suspected white coat syndrome), anxiety, cholecystectomy, right IJ Port-a-cath 05/27/18. BMI is consistent with obesity.   She was seen by her PCP Dr. Yong Channel on 05/22/18 for referral to general surgery following diagnosis of breast cancer. BP 146/92. At that time, she was working at a dental office and had periodically monitored her BP with SBP readings in the 120's and did not remember her DBP being quite so high. He suspected her elevated BP was likely related to white coat syndrome and stress of newly diagnosed breast cancer. He had asked her to buy a home BP monitor, although she does not recall this. BP elevated at her PAT visit, but feeling very anxious about surgery, COVID-19 test, etc. She did take Xanax this morning. At the end of her visit, BP recheck was reasonable at 138/92. She will purchase a BP monitor today and start monitoring at home to assistance in management recommendations in the future. If home trends still elevated then PAT RN advised close follow-up with PCP. She will plan to take Xanax prior to RSL.    Urine pregnancy test negative. COVID-19 test is scheduled for 11/01/18 (seed 11/04/18). She will have BP check prior to seed implant. If these results acceptable then I would anticipate that she can proceed as scheduled.    VS: BP (!) 138/92   Pulse (!) 115   Temp 36.9 C   Resp 20   Ht 5'  8" (1.727 m)   Wt 96.1 kg   LMP 05/21/2018   SpO2 99%   BMI 32.20 kg/m  BP 153/101 (automated), 150/128 (automated), 138/92 (manual at end of visit). HR 98 bpm on EKG.    PROVIDERS: [name], MD is PCP  Foye Deer, MD is HEM-ONC. Last visit 10/08/18. She had completed 4 cycles ofneoadjuvant chemotherapy with dose dense Adriamycin and Cytoxanand was receiving her cycle 12 of weekly Taxol.    LABS: Labs reviewed: Acceptable for surgery. (all labs ordered are listed, but only abnormal results are displayed)  Labs Reviewed  CBC - Abnormal; Notable for the following components:      Result Value   WBC 10.8 (*)    All other components within normal limits  BASIC METABOLIC PANEL - Abnormal; Notable for the following components:   Glucose, Bld 105 (*)    All other components within normal limits  PREGNANCY, URINE  POCT PREGNANCY, URINE     IMAGES: 1V PCXR 05/27/18: FINDINGS: Cardiac shadows within normal limits. The lungs are well aerated bilaterally. Right chest wall port is noted with the catheter tip in the mid superior vena cava. No pneumothorax is noted. The infiltrate is seen. IMPRESSION: No pneumothorax following port placement.   EKG: 10/31/18: Normal sinus rhythm at 98 bpm Low voltage QRS Confirmed by Kate Sable [phone]) on 10/31/2018 1:39:44 PM   CV: Echo 05/23/18: IMPRESSIONS  1. The left  ventricle has normal systolic function with an ejection fraction of 60-65%. The cavity size was normal. Left ventricular diastolic parameters were normal. No evidence of left ventricular regional wall motion abnormalities.  2. Normal average GLS of -18.5%.  3. The right ventricle has normal systolic function. The cavity was normal. There is no increase in right ventricular wall thickness. Right ventricular systolic pressure could not be assessed.  4. The aortic valve is tricuspid.  5. The aortic root, ascending aorta, aortic arch and descending aorta are  normal in size and structure.   Past Medical History:  Diagnosis Date  . Anxiety    SITUational -uses xanax during the day, reports the ativan is for bedtime  . Cancer ([place]) 05/05/2018   Breast caner  . Family history of colon cancer   . Family history of melanoma   . Hypertension    white coat syndrome , per pt.     Past Surgical History:  Procedure Laterality Date  . CHOLECYSTECTOMY N/A 04/09/2015   Procedure: LAPAROSCOPIC CHOLECYSTECTOMY WITH INTRAOPERATIVE CHOLANGIOGRAM;  Surgeon: Autumn Messing III, MD;  Location: [facility];  Service: General;  Laterality: N/A;  . DILATATION & CURRETTAGE/HYSTEROSCOPY WITH RESECTOCOPE N/A 07/31/2013   Procedure: [place];  Surgeon: Princess Bruins, MD;  Location: [place] [facility];  Service: Gynecology;  Laterality: N/A;  1 hr.  Marland Kitchen DILATION AND EVACUATION N/A 12/12/2017   Procedure: DILATATION AND EVACUATION (D&E) 2ND TRIMESTER;  Surgeon: Brien Few, MD;  Location: [place] [facility];  Service: Gynecology;  Laterality: N/A;  . OPERATIVE ULTRASOUND N/A 12/12/2017   Procedure: OPERATIVE ULTRASOUND;  Surgeon: Brien Few, MD;  Location: [facility];  Service: Gynecology;  Laterality: N/A;  . PORTACATH PLACEMENT Right 05/27/2018   Procedure: INSERTION PORT-A-CATH WITH ULTRASOUND;  Surgeon: [name], MD;  Location: [place];  Service: General;  Laterality: Right;  GENERAL AND LMA    MEDICATIONS: . ALPRAZolam (XANAX) 0.5 MG tablet  . LORazepam (ATIVAN) 0.5 MG tablet   No current facility-administered medications for this encounter.      Myra Gianotti, PA-C Surgical Short Stay/Anesthesiology [facility] Phone [phone] [facility] Phone [phone] 10/31/2018 3:32 PM

## 2018-11-01 ENCOUNTER — Other Ambulatory Visit (HOSPITAL_COMMUNITY)
Admission: RE | Admit: 2018-11-01 | Discharge: 2018-11-01 | Disposition: A | Discharge status: Home / Self Care | Payer: 59 | Source: Ambulatory Visit | Attending: General Surgery | Admitting: General Surgery

## 2018-11-01 DIAGNOSIS — Z01818 Encounter for other preprocedural examination: Secondary | ICD-10-CM | POA: Diagnosis not present

## 2018-11-01 LAB — SARS CORONAVIRUS 2 (TAT 6-24 HRS): SARS Coronavirus 2: NEGATIVE

## 2018-11-05 ENCOUNTER — Inpatient Hospital Stay: Payer: BC Managed Care – PPO | Attending: Hematology and Oncology

## 2018-11-05 ENCOUNTER — Encounter (HOSPITAL_COMMUNITY)
Admission: RE | Disposition: A | Discharge status: Home / Self Care | Payer: Self-pay | Source: Home / Self Care | Attending: General Surgery

## 2018-11-05 ENCOUNTER — Inpatient Hospital Stay: Payer: BC Managed Care – PPO

## 2018-11-05 ENCOUNTER — Ambulatory Visit (HOSPITAL_COMMUNITY): Payer: BC Managed Care – PPO | Admitting: Certified Registered Nurse Anesthetist

## 2018-11-05 ENCOUNTER — Observation Stay (HOSPITAL_COMMUNITY)
Admission: RE | Admit: 2018-11-05 | Discharge: 2018-11-06 | Disposition: A | Discharge status: Home / Self Care | Payer: BC Managed Care – PPO | Attending: General Surgery | Admitting: General Surgery

## 2018-11-05 ENCOUNTER — Encounter (HOSPITAL_COMMUNITY): Payer: Self-pay | Admitting: Certified Registered Nurse Anesthetist

## 2018-11-05 ENCOUNTER — Other Ambulatory Visit: Payer: Self-pay

## 2018-11-05 ENCOUNTER — Ambulatory Visit (HOSPITAL_COMMUNITY): Payer: BC Managed Care – PPO | Admitting: Vascular Surgery

## 2018-11-05 VITALS — BP 149/114 | HR 101 | Temp 98.0°F | Resp 20

## 2018-11-05 DIAGNOSIS — Z9221 Personal history of antineoplastic chemotherapy: Secondary | ICD-10-CM | POA: Insufficient documentation

## 2018-11-05 DIAGNOSIS — C773 Secondary and unspecified malignant neoplasm of axilla and upper limb lymph nodes: Secondary | ICD-10-CM | POA: Insufficient documentation

## 2018-11-05 DIAGNOSIS — I10 Essential (primary) hypertension: Secondary | ICD-10-CM | POA: Insufficient documentation

## 2018-11-05 DIAGNOSIS — Z79899 Other long term (current) drug therapy: Secondary | ICD-10-CM | POA: Insufficient documentation

## 2018-11-05 DIAGNOSIS — F419 Anxiety disorder, unspecified: Secondary | ICD-10-CM | POA: Diagnosis not present

## 2018-11-05 DIAGNOSIS — Z452 Encounter for adjustment and management of vascular access device: Secondary | ICD-10-CM | POA: Diagnosis not present

## 2018-11-05 DIAGNOSIS — Z95828 Presence of other vascular implants and grafts: Secondary | ICD-10-CM

## 2018-11-05 DIAGNOSIS — C50412 Malignant neoplasm of upper-outer quadrant of left female breast: Secondary | ICD-10-CM | POA: Insufficient documentation

## 2018-11-05 DIAGNOSIS — Z809 Family history of malignant neoplasm, unspecified: Secondary | ICD-10-CM | POA: Insufficient documentation

## 2018-11-05 DIAGNOSIS — Z818 Family history of other mental and behavioral disorders: Secondary | ICD-10-CM | POA: Diagnosis not present

## 2018-11-05 DIAGNOSIS — Z811 Family history of alcohol abuse and dependence: Secondary | ICD-10-CM | POA: Insufficient documentation

## 2018-11-05 DIAGNOSIS — Z5111 Encounter for antineoplastic chemotherapy: Secondary | ICD-10-CM | POA: Diagnosis not present

## 2018-11-05 DIAGNOSIS — Z800 Family history of malignant neoplasm of digestive organs: Secondary | ICD-10-CM | POA: Insufficient documentation

## 2018-11-05 DIAGNOSIS — Z170 Estrogen receptor positive status [ER+]: Secondary | ICD-10-CM | POA: Insufficient documentation

## 2018-11-05 DIAGNOSIS — Z820 Family history of epilepsy and other diseases of the nervous system: Secondary | ICD-10-CM | POA: Diagnosis not present

## 2018-11-05 DIAGNOSIS — Z807 Family history of other malignant neoplasms of lymphoid, hematopoietic and related tissues: Secondary | ICD-10-CM | POA: Insufficient documentation

## 2018-11-05 DIAGNOSIS — Z8261 Family history of arthritis: Secondary | ICD-10-CM | POA: Insufficient documentation

## 2018-11-05 DIAGNOSIS — Z823 Family history of stroke: Secondary | ICD-10-CM | POA: Diagnosis not present

## 2018-11-05 DIAGNOSIS — Z8249 Family history of ischemic heart disease and other diseases of the circulatory system: Secondary | ICD-10-CM | POA: Diagnosis not present

## 2018-11-05 DIAGNOSIS — C50912 Malignant neoplasm of unspecified site of left female breast: Secondary | ICD-10-CM | POA: Diagnosis present

## 2018-11-05 DIAGNOSIS — G8918 Other acute postprocedural pain: Secondary | ICD-10-CM | POA: Diagnosis not present

## 2018-11-05 DIAGNOSIS — Z9049 Acquired absence of other specified parts of digestive tract: Secondary | ICD-10-CM | POA: Insufficient documentation

## 2018-11-05 HISTORY — PX: PORT-A-CATH REMOVAL: SHX5289

## 2018-11-05 HISTORY — PX: BREAST LUMPECTOMY WITH RADIOACTIVE SEED AND AXILLARY LYMPH NODE DISSECTION: SHX6656

## 2018-11-05 LAB — POCT PREGNANCY, URINE: Preg Test, Ur: NEGATIVE

## 2018-11-05 SURGERY — BREAST LUMPECTOMY WITH RADIOACTIVE SEED AND AXILLARY LYMPH NODE DISSECTION
Anesthesia: General | Site: Chest | Laterality: Right

## 2018-11-05 MED ORDER — MIDAZOLAM HCL 2 MG/2ML IJ SOLN
INTRAMUSCULAR | Status: DC | PRN
  Administered 2018-11-05: 2 mg via INTRAVENOUS

## 2018-11-05 MED ORDER — KETOROLAC TROMETHAMINE 15 MG/ML IJ SOLN
15.0000 mg | Freq: Four times a day (QID) | INTRAMUSCULAR | Status: DC | PRN
  Administered 2018-11-05: 15 mg via INTRAVENOUS

## 2018-11-05 MED ORDER — ACETAMINOPHEN 500 MG PO TABS
ORAL_TABLET | ORAL | Status: AC
  Administered 2018-11-05: 1000 mg via ORAL
  Filled 2018-11-05: qty 1

## 2018-11-05 MED ORDER — 0.9 % SODIUM CHLORIDE (POUR BTL) OPTIME
TOPICAL | Status: DC | PRN
  Administered 2018-11-05: 1000 mL

## 2018-11-05 MED ORDER — GABAPENTIN 100 MG PO CAPS
100.0000 mg | ORAL_CAPSULE | ORAL | Status: AC
  Administered 2018-11-05: 11:00:00 100 mg via ORAL

## 2018-11-05 MED ORDER — KETOROLAC TROMETHAMINE 15 MG/ML IJ SOLN
INTRAMUSCULAR | Status: AC
  Filled 2018-11-05: qty 1

## 2018-11-05 MED ORDER — LIDOCAINE 2% (20 MG/ML) 5 ML SYRINGE
INTRAMUSCULAR | Status: AC
  Filled 2018-11-05: qty 15

## 2018-11-05 MED ORDER — ONDANSETRON HCL 4 MG/2ML IJ SOLN
4.0000 mg | Freq: Four times a day (QID) | INTRAMUSCULAR | Status: DC | PRN
  Administered 2018-11-05: 4 mg via INTRAVENOUS
  Filled 2018-11-05: qty 2

## 2018-11-05 MED ORDER — DEXAMETHASONE SODIUM PHOSPHATE 10 MG/ML IJ SOLN
INTRAMUSCULAR | Status: DC | PRN
  Administered 2018-11-05: 5 mg via INTRAVENOUS

## 2018-11-05 MED ORDER — DEXAMETHASONE SODIUM PHOSPHATE 10 MG/ML IJ SOLN
INTRAMUSCULAR | Status: AC
  Filled 2018-11-05: qty 1

## 2018-11-05 MED ORDER — MIDAZOLAM HCL 2 MG/2ML IJ SOLN
INTRAMUSCULAR | Status: AC
  Filled 2018-11-05: qty 2

## 2018-11-05 MED ORDER — SUCCINYLCHOLINE CHLORIDE 200 MG/10ML IV SOSY
PREFILLED_SYRINGE | INTRAVENOUS | Status: AC
  Filled 2018-11-05: qty 10

## 2018-11-05 MED ORDER — ONDANSETRON HCL 4 MG/2ML IJ SOLN
INTRAMUSCULAR | Status: DC | PRN
  Administered 2018-11-05: 4 mg via INTRAVENOUS

## 2018-11-05 MED ORDER — OXYCODONE HCL 5 MG PO TABS
ORAL_TABLET | ORAL | Status: AC
  Filled 2018-11-05: qty 2

## 2018-11-05 MED ORDER — ACETAMINOPHEN 500 MG PO TABS
1000.0000 mg | ORAL_TABLET | Freq: Four times a day (QID) | ORAL | Status: DC
  Administered 2018-11-05 – 2018-11-06 (×3): 1000 mg via ORAL
  Filled 2018-11-05 (×3): qty 2

## 2018-11-05 MED ORDER — MIDAZOLAM HCL 2 MG/2ML IJ SOLN
INTRAMUSCULAR | Status: AC
  Administered 2018-11-05: 2 mg via INTRAVENOUS
  Filled 2018-11-05: qty 2

## 2018-11-05 MED ORDER — HYDROMORPHONE HCL 1 MG/ML IJ SOLN
INTRAMUSCULAR | Status: AC
  Filled 2018-11-05: qty 1

## 2018-11-05 MED ORDER — CEFAZOLIN SODIUM-DEXTROSE 2-4 GM/100ML-% IV SOLN
2.0000 g | INTRAVENOUS | Status: AC
  Administered 2018-11-05: 2 g via INTRAVENOUS

## 2018-11-05 MED ORDER — MORPHINE SULFATE (PF) 2 MG/ML IV SOLN
1.0000 mg | INTRAVENOUS | Status: DC | PRN
  Administered 2018-11-05 – 2018-11-06 (×3): 1 mg via INTRAVENOUS
  Filled 2018-11-05 (×3): qty 1

## 2018-11-05 MED ORDER — ACETAMINOPHEN 500 MG PO TABS
1000.0000 mg | ORAL_TABLET | ORAL | Status: AC
  Administered 2018-11-05: 11:00:00 1000 mg via ORAL

## 2018-11-05 MED ORDER — ONDANSETRON 4 MG PO TBDP: 4.0000 mg | ORAL_TABLET | Freq: Four times a day (QID) | ORAL | Status: DC | PRN

## 2018-11-05 MED ORDER — HYDROMORPHONE HCL 1 MG/ML IJ SOLN
0.2500 mg | INTRAMUSCULAR | Status: DC | PRN
  Administered 2018-11-05 (×3): 0.5 mg via INTRAVENOUS

## 2018-11-05 MED ORDER — EPHEDRINE SULFATE-NACL 50-0.9 MG/10ML-% IV SOSY
PREFILLED_SYRINGE | INTRAVENOUS | Status: DC | PRN
  Administered 2018-11-05: 10 mg via INTRAVENOUS

## 2018-11-05 MED ORDER — BUPIVACAINE LIPOSOME 1.3 % IJ SUSP
INTRAMUSCULAR | Status: DC | PRN
  Administered 2018-11-05: 10 mL

## 2018-11-05 MED ORDER — ROCURONIUM BROMIDE 10 MG/ML (PF) SYRINGE
PREFILLED_SYRINGE | INTRAVENOUS | Status: AC
  Filled 2018-11-05: qty 10

## 2018-11-05 MED ORDER — SODIUM CHLORIDE 0.9 % IV SOLN: INTRAVENOUS | Status: DC

## 2018-11-05 MED ORDER — BUPIVACAINE-EPINEPHRINE (PF) 0.25% -1:200000 IJ SOLN
INTRAMUSCULAR | Status: AC
  Filled 2018-11-05: qty 30

## 2018-11-05 MED ORDER — ONDANSETRON HCL 4 MG/2ML IJ SOLN
INTRAMUSCULAR | Status: AC
  Filled 2018-11-05: qty 4

## 2018-11-05 MED ORDER — PROPOFOL 10 MG/ML IV BOLUS
INTRAVENOUS | Status: DC | PRN
  Administered 2018-11-05: 200 mg via INTRAVENOUS

## 2018-11-05 MED ORDER — CEFAZOLIN SODIUM-DEXTROSE 2-4 GM/100ML-% IV SOLN
INTRAVENOUS | Status: AC
  Filled 2018-11-05: qty 100

## 2018-11-05 MED ORDER — KETOROLAC TROMETHAMINE 15 MG/ML IJ SOLN
INTRAMUSCULAR | Status: AC
  Administered 2018-11-05: 15 mg via INTRAVENOUS
  Filled 2018-11-05: qty 1

## 2018-11-05 MED ORDER — MIDAZOLAM HCL 2 MG/2ML IJ SOLN
2.0000 mg | Freq: Once | INTRAMUSCULAR | Status: AC
  Administered 2018-11-05: 11:00:00 2 mg via INTRAVENOUS

## 2018-11-05 MED ORDER — OXYCODONE HCL 5 MG PO TABS
5.0000 mg | ORAL_TABLET | ORAL | Status: DC | PRN
  Administered 2018-11-05 – 2018-11-06 (×5): 10 mg via ORAL
  Filled 2018-11-05 (×4): qty 2

## 2018-11-05 MED ORDER — ONDANSETRON HCL 4 MG/2ML IJ SOLN
INTRAMUSCULAR | Status: AC
  Filled 2018-11-05: qty 2

## 2018-11-05 MED ORDER — STERILE WATER FOR IRRIGATION IR SOLN
Status: DC | PRN
  Administered 2018-11-05: 1000 mL

## 2018-11-05 MED ORDER — FENTANYL CITRATE (PF) 250 MCG/5ML IJ SOLN
INTRAMUSCULAR | Status: AC
  Filled 2018-11-05: qty 5

## 2018-11-05 MED ORDER — SIMETHICONE 80 MG PO CHEW: 40.0000 mg | CHEWABLE_TABLET | Freq: Four times a day (QID) | ORAL | Status: DC | PRN

## 2018-11-05 MED ORDER — GOSERELIN ACETATE 3.6 MG ~~LOC~~ IMPL
DRUG_IMPLANT | SUBCUTANEOUS | Status: AC
  Filled 2018-11-05: qty 3.6

## 2018-11-05 MED ORDER — FENTANYL CITRATE (PF) 250 MCG/5ML IJ SOLN
INTRAMUSCULAR | Status: DC | PRN
  Administered 2018-11-05: 150 ug via INTRAVENOUS
  Administered 2018-11-05 (×2): 50 ug via INTRAVENOUS

## 2018-11-05 MED ORDER — PHENYLEPHRINE 40 MCG/ML (10ML) SYRINGE FOR IV PUSH (FOR BLOOD PRESSURE SUPPORT)
PREFILLED_SYRINGE | INTRAVENOUS | Status: DC | PRN
  Administered 2018-11-05 (×2): 80 ug via INTRAVENOUS
  Administered 2018-11-05: 120 ug via INTRAVENOUS

## 2018-11-05 MED ORDER — METHOCARBAMOL 500 MG PO TABS
500.0000 mg | ORAL_TABLET | Freq: Three times a day (TID) | ORAL | Status: DC | PRN
  Administered 2018-11-05 – 2018-11-06 (×3): 500 mg via ORAL
  Filled 2018-11-05 (×2): qty 1

## 2018-11-05 MED ORDER — PHENYLEPHRINE 40 MCG/ML (10ML) SYRINGE FOR IV PUSH (FOR BLOOD PRESSURE SUPPORT)
PREFILLED_SYRINGE | INTRAVENOUS | Status: AC
  Filled 2018-11-05: qty 10

## 2018-11-05 MED ORDER — PROPOFOL 10 MG/ML IV BOLUS
INTRAVENOUS | Status: AC
  Filled 2018-11-05: qty 20

## 2018-11-05 MED ORDER — METHOCARBAMOL 500 MG PO TABS
ORAL_TABLET | ORAL | Status: AC
  Filled 2018-11-05: qty 1

## 2018-11-05 MED ORDER — FENTANYL CITRATE (PF) 100 MCG/2ML IJ SOLN
INTRAMUSCULAR | Status: AC
  Administered 2018-11-05: 100 ug via INTRAVENOUS
  Filled 2018-11-05: qty 2

## 2018-11-05 MED ORDER — KETOROLAC TROMETHAMINE 15 MG/ML IJ SOLN
15.0000 mg | INTRAMUSCULAR | Status: AC
  Administered 2018-11-05: 11:00:00 15 mg via INTRAVENOUS

## 2018-11-05 MED ORDER — ENSURE PRE-SURGERY PO LIQD: 296.0000 mL | Freq: Once | ORAL | Status: DC

## 2018-11-05 MED ORDER — HEMOSTATIC AGENTS (NO CHARGE) OPTIME
TOPICAL | Status: DC | PRN
  Administered 2018-11-05: 1 via TOPICAL

## 2018-11-05 MED ORDER — GABAPENTIN 300 MG PO CAPS
ORAL_CAPSULE | ORAL | Status: AC
  Administered 2018-11-05: 100 mg via ORAL
  Filled 2018-11-05: qty 1

## 2018-11-05 MED ORDER — BUPIVACAINE-EPINEPHRINE 0.25% -1:200000 IJ SOLN
INTRAMUSCULAR | Status: DC | PRN
  Administered 2018-11-05: 14 mL

## 2018-11-05 MED ORDER — LIDOCAINE 2% (20 MG/ML) 5 ML SYRINGE
INTRAMUSCULAR | Status: DC | PRN
  Administered 2018-11-05: 100 mg via INTRAVENOUS

## 2018-11-05 MED ORDER — LACTATED RINGERS IV SOLN
INTRAVENOUS | Status: DC
  Administered 2018-11-05 (×2): via INTRAVENOUS

## 2018-11-05 MED ORDER — GABAPENTIN 100 MG PO CAPS
ORAL_CAPSULE | ORAL | Status: AC
  Filled 2018-11-05: qty 1

## 2018-11-05 MED ORDER — FENTANYL CITRATE (PF) 100 MCG/2ML IJ SOLN
100.0000 ug | Freq: Once | INTRAMUSCULAR | Status: AC
  Administered 2018-11-05: 11:00:00 100 ug via INTRAVENOUS

## 2018-11-05 MED ORDER — BUPIVACAINE-EPINEPHRINE (PF) 0.5% -1:200000 IJ SOLN
INTRAMUSCULAR | Status: DC | PRN
  Administered 2018-11-05: 20 mL

## 2018-11-05 MED ORDER — ALPRAZOLAM 0.5 MG PO TABS: 0.5000 mg | ORAL_TABLET | Freq: Every day | ORAL | Status: DC | PRN

## 2018-11-05 MED ORDER — GOSERELIN ACETATE 3.6 MG ~~LOC~~ IMPL
3.6000 mg | DRUG_IMPLANT | Freq: Once | SUBCUTANEOUS | Status: AC
  Administered 2018-11-05: 3.6 mg via SUBCUTANEOUS

## 2018-11-05 SURGICAL SUPPLY — 48 items
ADH SKN CLS APL DERMABOND .7 (GAUZE/BANDAGES/DRESSINGS) ×2
APL PRP STRL LF DISP 70% ISPRP (MISCELLANEOUS) ×2
APPLIER CLIP 9.375 MED OPEN (MISCELLANEOUS) ×3
APR CLP MED 9.3 20 MLT OPN (MISCELLANEOUS) ×2
BINDER BREAST XXLRG (GAUZE/BANDAGES/DRESSINGS) ×1 IMPLANT
BIOPATCH RED 1 DISK 7.0 (GAUZE/BANDAGES/DRESSINGS) ×1 IMPLANT
CHLORAPREP W/TINT 26 (MISCELLANEOUS) ×1 IMPLANT
CLIP APPLIE 9.375 MED OPEN (MISCELLANEOUS) IMPLANT
CLSR STERI-STRIP ANTIMIC 1/2X4 (GAUZE/BANDAGES/DRESSINGS) ×1 IMPLANT
COVER PROBE W GEL 5X96 (DRAPES) ×1 IMPLANT
COVER SURGICAL LIGHT HANDLE (MISCELLANEOUS) ×3 IMPLANT
COVER WAND RF STERILE (DRAPES) ×3 IMPLANT
DECANTER SPIKE VIAL GLASS SM (MISCELLANEOUS) ×3 IMPLANT
DERMABOND ADVANCED (GAUZE/BANDAGES/DRESSINGS) ×1
DERMABOND ADVANCED .7 DNX12 (GAUZE/BANDAGES/DRESSINGS) ×2 IMPLANT
DRAIN CHANNEL 19F RND (DRAIN) ×1 IMPLANT
DRAPE ORTHO SPLIT 77X108 STRL (DRAPES) ×6
DRAPE SURG ORHT 6 SPLT 77X108 (DRAPES) IMPLANT
DRSG PAD ABDOMINAL 8X10 ST (GAUZE/BANDAGES/DRESSINGS) ×1 IMPLANT
ELECT CAUTERY BLADE 6.4 (BLADE) ×3 IMPLANT
ELECT REM PT RETURN 9FT ADLT (ELECTROSURGICAL) ×3
ELECTRODE REM PT RTRN 9FT ADLT (ELECTROSURGICAL) ×2 IMPLANT
EVACUATOR SILICONE 100CC (DRAIN) ×1 IMPLANT
GLOVE BIO SURGEON STRL SZ7 (GLOVE) ×3 IMPLANT
GLOVE BIOGEL PI IND STRL 7.5 (GLOVE) ×2 IMPLANT
GLOVE BIOGEL PI INDICATOR 7.5 (GLOVE) ×1
GOWN STRL REUS W/ TWL LRG LVL3 (GOWN DISPOSABLE) ×4 IMPLANT
GOWN STRL REUS W/TWL LRG LVL3 (GOWN DISPOSABLE) ×6
HEMOSTAT ARISTA ABSORB 3G PWDR (HEMOSTASIS) ×1 IMPLANT
ILLUMINATOR WAVEGUIDE N/F (MISCELLANEOUS) ×1 IMPLANT
KIT BASIN OR (CUSTOM PROCEDURE TRAY) ×3 IMPLANT
KIT MARKER MARGIN INK (KITS) ×1 IMPLANT
KIT TURNOVER KIT B (KITS) ×3 IMPLANT
NDL HYPO 25GX1X1/2 BEV (NEEDLE) ×2 IMPLANT
NEEDLE HYPO 25GX1X1/2 BEV (NEEDLE) ×3 IMPLANT
NS IRRIG 1000ML POUR BTL (IV SOLUTION) ×3 IMPLANT
PACK GENERAL/GYN (CUSTOM PROCEDURE TRAY) ×3 IMPLANT
PAD ARMBOARD 7.5X6 YLW CONV (MISCELLANEOUS) ×6 IMPLANT
SUT ETHILON 2 0 FS 18 (SUTURE) ×1 IMPLANT
SUT MNCRL AB 4-0 PS2 18 (SUTURE) ×4 IMPLANT
SUT VIC AB 2-0 SH 27 (SUTURE) ×12
SUT VIC AB 2-0 SH 27X BRD (SUTURE) IMPLANT
SUT VIC AB 2-0 SH 27XBRD (SUTURE) IMPLANT
SUT VIC AB 3-0 SH 27 (SUTURE) ×6
SUT VIC AB 3-0 SH 27X BRD (SUTURE) ×2 IMPLANT
SUT VICRYL AB 3 0 TIES (SUTURE) ×1 IMPLANT
SYR CONTROL 10ML LL (SYRINGE) ×3 IMPLANT
TOWEL GREEN STERILE FF (TOWEL DISPOSABLE) ×3 IMPLANT

## 2018-11-05 NOTE — Transfer of Care (Addendum)
Immediate Anesthesia Transfer of Care Note  Patient: Bethany Hanna  Procedure(s) Performed: LEFT BREAST LUMPECTOMY WITH BRACKETED RADIOACTIVE SEED AND LEFT AXILLARY LYMPH NODE DISSECTION (Left Breast) REMOVAL PORT-A-CATH (Right Chest)  Patient Location: PACU  Anesthesia Type:GA combined with regional for post-op pain  Level of Consciousness: awake, alert  and patient cooperative  Airway & Oxygen Therapy: Patient Spontanous Breathing  Post-op Assessment: Report given to RN and Post -op Vital signs reviewed and stable  Post vital signs: Reviewed and stable  Last Vitals:  Vitals Value Taken Time  BP 139/97 11/05/18 1333  Temp    Pulse 120 11/05/18 1336  Resp 22 11/05/18 1336  SpO2 100 % 11/05/18 1336  Vitals shown include unvalidated device data.  Last Pain:  Vitals:   11/05/18 1041  PainSc: 0-No pain         Complications: No apparent anesthesia complications

## 2018-11-05 NOTE — Anesthesia Preprocedure Evaluation (Addendum)
Anesthesia Evaluation  Patient identified by MRN, date of birth, ID band Patient awake    Reviewed: Allergy & Precautions, H&P , NPO status , Patient's Chart, lab work & pertinent test results  Airway Mallampati: II  TM Distance: >3 FB Neck ROM: Full    Dental no notable dental hx. (+) Teeth Intact, Dental Advisory Given   Pulmonary neg pulmonary ROS,    Pulmonary exam normal breath sounds clear to auscultation       Cardiovascular hypertension,  Rhythm:Regular Rate:Normal     Neuro/Psych Anxiety negative neurological ROS  negative psych ROS   GI/Hepatic negative GI ROS, Neg liver ROS,   Endo/Other  negative endocrine ROS  Renal/GU negative Renal ROS  negative genitourinary   Musculoskeletal   Abdominal   Peds  Hematology negative hematology ROS (+)   Anesthesia Other Findings   Reproductive/Obstetrics negative OB ROS                            Anesthesia Physical Anesthesia Plan  ASA: II  Anesthesia Plan: General   Post-op Pain Management:  Regional for Post-op pain   Induction: Intravenous  PONV Risk Score and Plan: 4 or greater and Ondansetron, Dexamethasone and Midazolam  Airway Management Planned: LMA  Additional Equipment:   Intra-op Plan:   Post-operative Plan: Extubation in OR  Informed Consent: I have reviewed the patients History and Physical, chart, labs and discussed the procedure including the risks, benefits and alternatives for the proposed anesthesia with the patient or authorized representative who has indicated his/her understanding and acceptance.     Dental advisory given  Plan Discussed with: CRNA  Anesthesia Plan Comments:         Anesthesia Quick Evaluation

## 2018-11-05 NOTE — Anesthesia Procedure Notes (Signed)
Anesthesia Regional Block: Pectoralis block   Pre-Anesthetic Checklist: ,, timeout performed, Correct Patient, Correct Site, Correct Laterality, Correct Procedure, Correct Position, site marked, Risks and benefits discussed, pre-op evaluation,  At surgeon's request and post-op pain management  Laterality: Left  Prep: Maximum Sterile Barrier Precautions used, chloraprep       Needles:  Injection technique: Single-shot  Needle Type: Echogenic Stimulator Needle     Needle Length: 9cm  Needle Gauge: 21     Additional Needles:   Procedures:,,,, ultrasound used (permanent image in chart),,,,  Narrative:  Start time: 11/05/2018 11:15 AM End time: 11/05/2018 11:25 AM Injection made incrementally with aspirations every 5 mL. Anesthesiologist: Roderic Palau, MD  Additional Notes: 2% Lidocaine skin wheel.

## 2018-11-05 NOTE — Patient Instructions (Signed)

## 2018-11-05 NOTE — Interval H&P Note (Signed)
History and Physical Interval Note:  11/05/2018 11:17 AM  Bethany Hanna  has presented today for surgery, with the diagnosis of left breast cancer.  The various methods of treatment have been discussed with the patient and family. After consideration of risks, benefits and other options for treatment, the patient has consented to  Procedure(s): LEFT BREAST LUMPECTOMY WITH BRACKETED RADIOACTIVE SEED AND LEFT AXILLARY LYMPH NODE DISSECTION (Left) REMOVAL PORT-A-CATH (N/A) as a surgical intervention.  The patient's history has been reviewed, patient examined, no change in status, stable for surgery.  I have reviewed the patient's chart and labs.  Questions were answered to the patient's satisfaction.     [name]

## 2018-11-05 NOTE — Progress Notes (Signed)
Okay to proceed with Faslodex today with BP of 149/114 per Dr. Lindi Adie

## 2018-11-05 NOTE — H&P (View-Only) (Signed)
Bethany Hanna is an 38 y.o. female.   Chief Complaint: left breast cancer HPI: 57 yof referred by Dr Lindi Adie for new left breast cancer. she has a 5 and 94 year old son. 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 had miscarriage last year. she has no fh of breast or ovarian cancer. she has no prior breast history. she has no dc. she noted a mass after the miscarriage that persisted. she had mammogram that shows d density breasts. there is a 2 cm irregular mass in the uoq. US of the breast shows a 4 cm mass with various size nodes in the left axilla. one of these is 2.5 cm. 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 node is positive. her mri showed a 5.1x3.1x3.6 cm mass and at least 6 abnormal nodes present. she has undergone act and finished on 8/4 . she cannot feel mass anymore. she has done well, no real issues with chemotherapy. she returns today to discuss surgery   Past Medical History:  Diagnosis Date  . Anxiety    SITUational -uses xanax during the day, reports the ativan is for bedtime  . Cancer ([place]) 05/05/2018   Breast caner  . Family history of colon cancer   . Family history of melanoma   . Hypertension    white coat syndrome , per pt.     Past Surgical History:  Procedure Laterality Date  . CHOLECYSTECTOMY N/A 04/09/2015   Procedure: LAPAROSCOPIC CHOLECYSTECTOMY WITH INTRAOPERATIVE CHOLANGIOGRAM;  Surgeon: Autumn Messing III, MD;  Location: [facility];  Service: General;  Laterality: N/A;  . DILATATION & CURRETTAGE/HYSTEROSCOPY WITH RESECTOCOPE N/A 07/31/2013   Procedure: [place];  Surgeon: Princess Bruins, MD;  Location: [facility];  Service: Gynecology;  Laterality: N/A;  1 hr.  Marland Kitchen DILATION AND EVACUATION N/A 12/12/2017   Procedure: DILATATION AND EVACUATION (D&E) 2ND TRIMESTER;  Surgeon: Brien Few, MD;  Location: [facility];   Service: Gynecology;  Laterality: N/A;  . OPERATIVE ULTRASOUND N/A 12/12/2017   Procedure: OPERATIVE ULTRASOUND;  Surgeon: Brien Few, MD;  Location: [facility];  Service: Gynecology;  Laterality: N/A;  . PORTACATH PLACEMENT Right 05/27/2018   Procedure: INSERTION PORT-A-CATH WITH ULTRASOUND;  Surgeon: [name], MD;  Location: [facility];  Service: General;  Laterality: Right;  GENERAL AND LMA    Family History  Problem Relation Age of Onset  . Arthritis Mother   . Migraines Mother   . ADD / ADHD Mother   . Varicose Veins Mother   . Hypertension Father   . Alcohol abuse Brother   . Varicose Veins Brother   . Stroke Maternal Grandmother   . Melanoma Maternal Grandmother   . Pulmonary fibrosis Maternal Grandfather   . Mitral valve prolapse Paternal Grandmother   . Migraines Paternal Grandmother   . Lymphoma Paternal Grandfather        was treated for this and did ok  . Heart failure Paternal Grandfather   . Eczema Son   . Colon cancer Maternal Aunt 72   Social History:  reports that she has never smoked. She has never used smokeless tobacco. She reports current alcohol use of about 2.0 - 3.0 standard drinks of alcohol per week. She reports that she does not use drugs.  Allergies: No Known Allergies  No medications prior to admission.    No  results found for this or any previous visit (from the past 51 hour(s)). No results found.  Review of Systems  All other systems reviewed and are negative.   There were no vitals taken for this visit. Physical Exam  cv rrr Lungs clear abd soft nt No palpable breast mass/no lad  Assessment/Plan BREAST CANCER OF UPPER-OUTER QUADRANT OF LEFT FEMALE BREAST (C50.412) Story: Left breast seed guided lumpectomy pending mri, left alnd, port removal I think she needs alnd regardless of imaging and negative clinical nodes (she was negative pre chemotherapy also). with at least six standard would be alnd in a 38 yo. we  discussed procedure, risk, drain and risk of lymphedema long term which will be higher given radiotherapy as well. we discussed lumpectomy vs mastectomy. she has great clinical response will see if on imaging as well. even if larger due to breast size can likely do lumpectomy. she would desire lumpectomy no reason she needs mastectomy for medical care. we dsicussed seed guided lumpectomy and pos margin risk.   [name], MD 11/05/2018, 7:24 AM

## 2018-11-05 NOTE — Anesthesia Postprocedure Evaluation (Signed)
Anesthesia Post Note  Patient: Bethany Hanna  Procedure(s) Performed: LEFT BREAST LUMPECTOMY WITH BRACKETED RADIOACTIVE SEED AND LEFT AXILLARY LYMPH NODE DISSECTION (Left Breast) REMOVAL PORT-A-CATH (Right Chest)     Patient location during evaluation: PACU Anesthesia Type: General Level of consciousness: awake and alert Pain management: pain level controlled Vital Signs Assessment: post-procedure vital signs reviewed and stable Respiratory status: spontaneous breathing, nonlabored ventilation, respiratory function stable and patient connected to nasal cannula oxygen Cardiovascular status: blood pressure returned to baseline and stable Postop Assessment: no apparent nausea or vomiting Anesthetic complications: no    Last Vitals:  Vitals:   11/05/18 1445 11/05/18 1448  BP:  (!) 134/96  Pulse: 89 (!) 101  Resp: 14 14  Temp: 36.6 C   SpO2: 96% 95%    Last Pain:  Vitals:   11/05/18 1445  PainSc: 3                  Effie Berkshire

## 2018-11-05 NOTE — Discharge Instructions (Signed)
[facility] Office Phone Number [phone]  BREAST BIOPSY/ PARTIAL MASTECTOMY: POST OP INSTRUCTIONS Take 400 mg of ibuprofen every 8 hours or 650 mg tylenol every 6 hours for next 72 hours then as needed. Use ice several times daily also. Always review your discharge instruction sheet given to you by the facility where your surgery was performed.  IF YOU HAVE DISABILITY OR FAMILY LEAVE FORMS, YOU MUST BRING THEM TO THE OFFICE FOR PROCESSING.  DO NOT GIVE THEM TO YOUR DOCTOR.  1. A prescription for pain medication may be given to you upon discharge.  Take your pain medication as prescribed, if needed.  If narcotic pain medicine is not needed, then you may take acetaminophen (Tylenol), naprosyn (Alleve) or ibuprofen (Advil) as needed. 2. Take your usually prescribed medications unless otherwise directed 3. If you need a refill on your pain medication, please contact your pharmacy.  They will contact our office to request authorization.  Prescriptions will not be filled after 5pm or on week-ends. 4. You should eat very light the first 24 hours after surgery, such as soup, crackers, pudding, etc.  Resume your normal diet the day after surgery. 5. Most patients will experience some swelling and bruising in the breast.  Ice packs and a good support bra will help.  Wear the breast binder provided or a sports bra for 72 hours day and night.  After that wear a sports bra during the day until you return to the office. Swelling and bruising can take several days to resolve.  6. It is common to experience some constipation if taking pain medication after surgery.  Increasing fluid intake and taking a stool softener will usually help or prevent this problem from occurring.  A mild laxative (Milk of Magnesia or Miralax) should be taken according to package directions if there are no bowel movements after 48 hours. 7. Unless discharge instructions indicate otherwise, you may remove your bandages 48  hours after surgery and you may shower at that time.  You may have steri-strips (small skin tapes) in place directly over the incision.  These strips should be left on the skin for 7-10 days and will come off on their own.  If your surgeon used skin glue on the incision, you may shower in 24 hours.  The glue will flake off over the next 2-3 weeks.  Any sutures or staples will be removed at the office during your follow-up visit. 8. ACTIVITIES:  You may resume regular daily activities (gradually increasing) beginning the next day.  Wearing a good support bra or sports bra minimizes pain and swelling.  You may have sexual intercourse when it is comfortable. a. You may drive when you no longer are taking prescription pain medication, you can comfortably wear a seatbelt, and you can safely maneuver your car and apply brakes. b. RETURN TO WORK:  ______________________________________________________________________________________ 9. You should see your doctor in the office for a follow-up appointment approximately two weeks after your surgery.  Your doctor's nurse will typically make your follow-up appointment when she calls you with your pathology report.  Expect your pathology report 3-4 business days after your surgery.  You may call to check if you do not hear from us after three days. 10. OTHER INSTRUCTIONS: _______________________________________________________________________________________________ _____________________________________________________________________________________________________________________________________ _____________________________________________________________________________________________________________________________________ _____________________________________________________________________________________________________________________________________  WHEN TO CALL DR Aimar Borghi: 1. Fever over 101.0 2. Nausea and/or vomiting. 3. Extreme swelling or  bruising. 4. Continued bleeding from incision. 5. Increased pain, redness, or drainage from the incision.  The clinic   staff is available to answer your questions during regular business hours.  Please don't hesitate to call and ask to speak to one of the nurses for clinical concerns.  If you have a medical emergency, go to the nearest emergency room or call 911.  A surgeon from [facility] is always on call at the hospital.  For further questions, please visit [email or web address]  

## 2018-11-05 NOTE — H&P (Signed)
Bethany Hanna is an 38 y.o. female.   Chief Complaint: left breast cancer HPI: 52 yof referred by Dr Lindi Adie for new left breast cancer. she has a 84 and 2 year old son. 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 had miscarriage last year. she has no fh of breast or ovarian cancer. she has no prior breast history. she has no dc. she noted a mass after the miscarriage that persisted. she had mammogram that shows d density breasts. there is a 2 cm irregular mass in the uoq. US of the breast shows a 4 cm mass with various size nodes in the left axilla. one of these is 2.5 cm. 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 node is positive. her mri showed a 5.1x3.1x3.6 cm mass and at least 6 abnormal nodes present. she has undergone act and finished on 8/4 . she cannot feel mass anymore. she has done well, no real issues with chemotherapy. she returns today to discuss surgery   Past Medical History:  Diagnosis Date  . Anxiety    SITUational -uses xanax during the day, reports the ativan is for bedtime  . Cancer ([place]) 05/05/2018   Breast caner  . Family history of colon cancer   . Family history of melanoma   . Hypertension    white coat syndrome , per pt.     Past Surgical History:  Procedure Laterality Date  . CHOLECYSTECTOMY N/A 04/09/2015   Procedure: LAPAROSCOPIC CHOLECYSTECTOMY WITH INTRAOPERATIVE CHOLANGIOGRAM;  Surgeon: Autumn Messing III, MD;  Location: [facility];  Service: General;  Laterality: N/A;  . DILATATION & CURRETTAGE/HYSTEROSCOPY WITH RESECTOCOPE N/A 07/31/2013   Procedure: Falls;  Surgeon: Princess Bruins, MD;  Location: [facility];  Service: Gynecology;  Laterality: N/A;  1 hr.  Marland Kitchen DILATION AND EVACUATION N/A 12/12/2017   Procedure: DILATATION AND EVACUATION (D&E) 2ND TRIMESTER;  Surgeon: Brien Few, MD;  Location: [facility];   Service: Gynecology;  Laterality: N/A;  . OPERATIVE ULTRASOUND N/A 12/12/2017   Procedure: OPERATIVE ULTRASOUND;  Surgeon: Brien Few, MD;  Location: [facility];  Service: Gynecology;  Laterality: N/A;  . PORTACATH PLACEMENT Right 05/27/2018   Procedure: INSERTION PORT-A-CATH WITH ULTRASOUND;  Surgeon: [name], MD;  Location: [facility];  Service: General;  Laterality: Right;  GENERAL AND LMA    Family History  Problem Relation Age of Onset  . Arthritis Mother   . Migraines Mother   . ADD / ADHD Mother   . Varicose Veins Mother   . Hypertension Father   . Alcohol abuse Brother   . Varicose Veins Brother   . Stroke Maternal Grandmother   . Melanoma Maternal Grandmother   . Pulmonary fibrosis Maternal Grandfather   . Mitral valve prolapse Paternal Grandmother   . Migraines Paternal Grandmother   . Lymphoma Paternal Grandfather        was treated for this and did ok  . Heart failure Paternal Grandfather   . Eczema Son   . Colon cancer Maternal Aunt 29   Social History:  reports that she has never smoked. She has never used smokeless tobacco. She reports current alcohol use of about 2.0 - 3.0 standard drinks of alcohol per week. She reports that she does not use drugs.  Allergies: No Known Allergies  No medications prior to admission.    No  results found for this or any previous visit (from the past 59 hour(s)). No results found.  Review of Systems  All other systems reviewed and are negative.   There were no vitals taken for this visit. Physical Exam  cv rrr Lungs clear abd soft nt No palpable breast mass/no lad  Assessment/Plan BREAST CANCER OF UPPER-OUTER QUADRANT OF LEFT FEMALE BREAST (C50.412) Story: Left breast seed guided lumpectomy pending mri, left alnd, port removal I think she needs alnd regardless of imaging and negative clinical nodes (she was negative pre chemotherapy also). with at least six standard would be alnd in a 38 yo. we  discussed procedure, risk, drain and risk of lymphedema long term which will be higher given radiotherapy as well. we discussed lumpectomy vs mastectomy. she has great clinical response will see if on imaging as well. even if larger due to breast size can likely do lumpectomy. she would desire lumpectomy no reason she needs mastectomy for medical care. we dsicussed seed guided lumpectomy and pos margin risk.   [name], MD 11/05/2018, 7:24 AM

## 2018-11-05 NOTE — Progress Notes (Signed)
Spoke with Kathlee Nations, RN  and per Dr Lindi Adie PT can receive injection today with current BP.

## 2018-11-05 NOTE — Op Note (Signed)
Preoperative diagnosis: Clinical stage II left breast cancer status post primary chemotherapy Postoperative diagnosis: Same as above Procedure: 1.  Right IJ port removal 2.  Left breast radioactive seed bracketed lumpectomy 3.  Left axillary lymph node dissection Surgeon: Dr. Serita Grammes Estimated blood loss: 20 cc Anesthesia: General with left pectoral block Specimens: 1.  Left breast tissue containing 3 clips and 2 radioactive seeds 2.  Left axillary nodes containing prior clip Complications: None Drains: 19 French Blake drain to left axilla Sponge needle count was correct at completion Disposition to recovery stable condition  Indications: This is a 38 year old female who initially presented several months ago with a 2 cm mass on a mammogram.  On ultrasound this was 4 cm with multiple left axillary nodes that were positive.  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  The node was also positive.  The MRI showed a 5.1 x 3.6 cm mass with at least 6 abnormal nodes.  She tolerated chemotherapy well.  Her genetics was negative.  We discussed options and elected to proceed with a seed bracketed lumpectomy as well as an axillary lymph node dissection.  Procedure: After informed consent was obtained the patient first on a left pectoral block.  She had had seeds placed bracketing the lesion.  I had these mammograms in the operating room.  She was given antibiotics and SCDs were on.  She was placed under general anesthesia without complication.  She was prepped and draped in the standard sterile surgical fashion.  A surgical timeout was then performed.  I first remove the port on the right side.  I infiltrated Marcaine underneath the incision.  I reentered the incision.  I then remove the port and the line in their entirety.  All foreign body was removed from there.  This was hemostatic.  I closed this with 2-0 Vicryl, 3-0 Vicryl, and 4-0 Monocryl.  Glue and  Steri-Strips were applied.  I then then the left breast seed bracketed lumpectomy.  I was unable to make a periareolar incision due to the fact that 1 of the seeds was fairly close to the skin.  This was a cylinder of disease that was in the upper outer quadrant.  I elected to make a curvilinear incision in the upper outer quadrant.  After infiltration with Marcaine.  I then made an incision.  I used the neoprobe to identify the anterior seed.  I then used cautery to excise the tissue intervening between the 2 seeds to include both seeds.  This was then passed off the table and marked with paint.  I did a mammogram and confirmed removal of the 2 clips as well as the radioactive seeds in the intervening tissue with an additional clip.  Hemostasis was obtained.  I placed clips in the cavity.  I then closed the cavity completely with 2-0 Vicryl.  The skin was closed with 3-0 Vicryl and 4-0 Monocryl.  Glue and Steri-Strips were applied.  I then made a curvilinear incision in the left axilla below the hairline that extended from the pectoralis major posteriorly.  I dissected through the axillary fascia.  I noted she still had a number of enlarged lymph nodes.  I was able to identify the chest wall and then the axillary vein.  This was somewhat difficult as she had several lymph nodes that were adherent to the axillary vein.  I was a combination of clips as well as ties to remove these nodes from the  axillary vein.  I swept this tissue caudad taking care to preserve both the thoracodorsal bundle and the long thoracic nerve.  This tissue was then passed off the table.  Hemostasis was obtained.  I did place some Arista in the cavity.  I placed a 33 [place] Blake drain in this cavity.  I sutured this to the skin with a 2-0 nylon suture and placed a Biopatch and a dressing.  I then closed the axillary fascia with a 2-0 Vicryl.  The skin was closed with a 3-0 Vicryl and a 4-0 Monocryl.  Glue and Steri-Strips were applied.   She tolerated this well was extubated and transferred to recovery stable.

## 2018-11-06 ENCOUNTER — Encounter (HOSPITAL_COMMUNITY): Payer: Self-pay | Admitting: General Practice

## 2018-11-06 DIAGNOSIS — Z820 Family history of epilepsy and other diseases of the nervous system: Secondary | ICD-10-CM | POA: Diagnosis not present

## 2018-11-06 DIAGNOSIS — Z8249 Family history of ischemic heart disease and other diseases of the circulatory system: Secondary | ICD-10-CM | POA: Diagnosis not present

## 2018-11-06 DIAGNOSIS — Z818 Family history of other mental and behavioral disorders: Secondary | ICD-10-CM | POA: Diagnosis not present

## 2018-11-06 DIAGNOSIS — Z809 Family history of malignant neoplasm, unspecified: Secondary | ICD-10-CM | POA: Diagnosis not present

## 2018-11-06 DIAGNOSIS — F419 Anxiety disorder, unspecified: Secondary | ICD-10-CM | POA: Diagnosis not present

## 2018-11-06 DIAGNOSIS — Z9049 Acquired absence of other specified parts of digestive tract: Secondary | ICD-10-CM | POA: Diagnosis not present

## 2018-11-06 DIAGNOSIS — Z79899 Other long term (current) drug therapy: Secondary | ICD-10-CM | POA: Diagnosis not present

## 2018-11-06 DIAGNOSIS — C773 Secondary and unspecified malignant neoplasm of axilla and upper limb lymph nodes: Secondary | ICD-10-CM | POA: Diagnosis not present

## 2018-11-06 DIAGNOSIS — Z823 Family history of stroke: Secondary | ICD-10-CM | POA: Diagnosis not present

## 2018-11-06 DIAGNOSIS — C50412 Malignant neoplasm of upper-outer quadrant of left female breast: Secondary | ICD-10-CM | POA: Diagnosis not present

## 2018-11-06 DIAGNOSIS — Z811 Family history of alcohol abuse and dependence: Secondary | ICD-10-CM | POA: Diagnosis not present

## 2018-11-06 DIAGNOSIS — Z8261 Family history of arthritis: Secondary | ICD-10-CM | POA: Diagnosis not present

## 2018-11-06 DIAGNOSIS — Z170 Estrogen receptor positive status [ER+]: Secondary | ICD-10-CM | POA: Diagnosis not present

## 2018-11-06 DIAGNOSIS — Z800 Family history of malignant neoplasm of digestive organs: Secondary | ICD-10-CM | POA: Diagnosis not present

## 2018-11-06 DIAGNOSIS — Z9221 Personal history of antineoplastic chemotherapy: Secondary | ICD-10-CM | POA: Diagnosis not present

## 2018-11-06 DIAGNOSIS — I10 Essential (primary) hypertension: Secondary | ICD-10-CM | POA: Diagnosis not present

## 2018-11-06 MED ORDER — OXYCODONE HCL 5 MG PO TABS: 5.0000 mg | ORAL_TABLET | ORAL | 0 refills | Status: DC | PRN

## 2018-11-06 MED ORDER — GABAPENTIN 100 MG PO CAPS: 100.0000 mg | ORAL_CAPSULE | Freq: Every day | ORAL | 1 refills | Status: DC

## 2018-11-06 NOTE — Progress Notes (Signed)
Patient discharged to home with instructions given to husband and verbalized understanding.

## 2018-11-06 NOTE — Discharge Summary (Signed)
Physician Discharge Summary  Patient ID: Bethany Hanna MRN: [id number] DOB/AGE: 1980/05/21 38 y.o.  Admit date: 11/05/2018 Discharge date: 11/06/2018  Admission Diagnoses: Clinical stage II left breast cancer s/p primary chemotherapy  Discharge Diagnoses:  Active Problems:   Breast cancer, left breast [facility])   Discharged Condition: good  Hospital Course: 38 yof s/p primary chemotherapy for clinical stage II left breast cancer. She underwent port removal, seed bracketed lumpectomy and alnd. The following day she is doing well and will be discharged home  Consults: None  Significant Diagnostic Studies: none  Treatments: surgery: see above  Discharge Exam: Blood pressure (!) 148/102, pulse 82, temperature (!) 97.5 F (36.4 C), temperature source Oral, resp. rate 19, height 5\' 8"  (1.727 m), weight 94.3 kg, SpO2 100 %. Incision/Wound:port incision clean, lumpectomy incision without drainage or hematoma, axillary incision without drainage or hematoma, drain with expected serosang output  Disposition: Discharge disposition: 01-Home or Self Care        Allergies as of 11/06/2018   No Known Allergies     Medication List    TAKE these medications   ALPRAZolam 0.5 MG tablet Commonly known as: XANAX Take 1 tablet (0.5 mg total) by mouth at bedtime as needed for anxiety. What changed: when to take this   gabapentin 100 MG capsule Commonly known as: Neurontin Take 1 capsule (100 mg total) by mouth at bedtime.   LORazepam 0.5 MG tablet Commonly known as: ATIVAN TAKE 1 TABLET(0.5 MG) BY MOUTH AT BEDTIME AS NEEDED FOR NAUSEA OR VOMITING What changed:   how much to take  how to take this  when to take this  reasons to take this  additional instructions   oxyCODONE 5 MG immediate release tablet Commonly known as: Oxy IR/ROXICODONE Take 1 tablet (5 mg total) by mouth every 4 (four) hours as needed for moderate pain.   ZOLADEX [place] Inject into the skin every 28  (twenty-eight) days.      Follow-up Information    [name], MD In 2 weeks.   Specialty: General Surgery Contact information: [address] [place] [address] [phone]           Signed: [name] 11/06/2018, 9:26 AM

## 2018-11-08 ENCOUNTER — Other Ambulatory Visit: Payer: Self-pay | Admitting: General Surgery

## 2018-11-12 NOTE — Progress Notes (Signed)
HEMATOLOGY-ONCOLOGY DOXIMITY VISIT PROGRESS NOTE  I connected with Bethany Hanna on 11/13/2018 at  2:45 PM EDT by Doximity video conference and verified that I am speaking with the correct person using two identifiers.  I discussed the limitations, risks, security and privacy concerns of performing an evaluation and management service by Doximity and the availability of in person appointments.  I also discussed with the patient that there may be a patient responsible charge related to this service. The patient expressed understanding and agreed to proceed.  Patient's Location: Home Physician Location: Clinic  CHIEF COMPLIANT: Follow-up s/p lumpectomy to review pathology report   INTERVAL HISTORY: Bethany Hanna is a 38 y.o. female with above-mentioned history of left breastcancerwho completedneoadjuvant chemotherapy. She underwent a left breast lumpectomy on 11/05/18 with Dr. [name] for which pathology showed residual invasive carcinoma s/p neoadjuvant treatment, HER-2 negative (0), ER 100%, PR 60%, Ki67 10%, involved inferior and medial margins, and 8/9 left axillary lymph nodes positive for carcinoma. She presents over Doximity today to review the pathology report and discuss further treatment.   Oncology History  Malignant neoplasm of upper-outer quadrant of left breast in female, estrogen receptor positive ([place])  05/13/2018 Initial Diagnosis   Evaluation of 5 months of thickening of the left breast upper outer quadrant.  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  Several lymph nodes were identified by ultrasound.  T2 N1 stage IIb clinical stage   05/16/2018 Cancer Staging   Staging form: Breast, AJCC 8th Edition - Clinical stage from  05/16/2018: Stage IIB (cT2, cN1, cM0, G3, ER+, PR+, HER2-) - Signed by [name], MD on 05/16/2018   05/28/2018 -  Neo-Adjuvant Chemotherapy   Neoadjuvant chemotherapy with dose dense Adriamycin and Cytoxan followed by Taxol weekly x12   08/16/2018 Genetic Testing   Negative genetic testing on the multi-cancer panel.  The Multi-Gene Panel offered by [facility] includes sequencing and/or deletion duplication testing of the following 85 genes: AIP, ALK, APC, ATM, AXIN2,BAP1,  BARD1, BLM, BMPR1A, BRCA1, BRCA2, BRIP1, CASR, CDC73, CDH1, CDK4, CDKN1B, CDKN1C, CDKN2A (p14ARF), CDKN2A (p16INK4a), CEBPA, CHEK2, CTNNA1, DICER1, DIS3L2, EGFR (c.2369C>T, p.Thr790Met variant only), EPCAM (Deletion/duplication testing only), FH, FLCN, GATA2, GPC3, GREM1 (Promoter region deletion/duplication testing only), HOXB13 (c.251G>A, p.Gly84Glu), HRAS, KIT, MAX, MEN1, MET, MITF (c.952G>A, p.Glu318Lys variant only), MLH1, MSH2, MSH3, MSH6, MUTYH, NBN, NF1, NF2, NTHL1, PALB2, PDGFRA, PHOX2B, PMS2, POLD1, POLE, POT1, PRKAR1A, PTCH1, PTEN, RAD50, RAD51C, RAD51D, RB1, RECQL4, RET, RNF43, RUNX1, SDHAF2, SDHA (sequence changes only), SDHB, SDHC, SDHD, SMAD4, SMARCA4, SMARCB1, SMARCE1, STK11, SUFU, TERC, TERT, TMEM127, TP53, TSC1, TSC2, VHL, WRN and WT1.  The report date is August 16, 2018.   10/07/2018 Breast MRI   Significant improvement of left breast malignancy.  Non-mass enhancement 3.7 cm.  Significant improvement in the left axillary lymph nodes previously there were 6 nodes and currently there was a single lymph node with mild cortical thickening.   11/05/2018 Surgery   Left lumpectomy [name]: residual invasive carcinoma s/p neoadjuvant treatment, HER-2 - (0), ER+ 100%, PR+ 60%, Ki67 10%, involved inferior and medial margins, and 8/9 left axillary lymph nodes positive for carcinoma.     REVIEW OF SYSTEMS:   Constitutional: Denies fevers, chills or abnormal weight loss Eyes: Denies blurriness of vision Ears, nose, mouth,  throat, and  face: Denies mucositis or sore throat Respiratory: Denies cough, dyspnea or wheezes Cardiovascular: Denies palpitation, chest discomfort Gastrointestinal:  Denies nausea, heartburn or change in bowel habits Skin: Denies abnormal skin rashes Lymphatics: Denies new lymphadenopathy or easy bruising Neurological:Denies numbness, tingling or new weaknesses Behavioral/Psych: Mood is stable, no new changes  Extremities: No lower extremity edema Breast: s/p left lumpectomy All other systems were reviewed with the patient and are negative.  Observations/Objective:  There were no vitals filed for this visit. There is no height or weight on file to calculate BMI.  I have reviewed the data as listed CMP Latest Ref Rng & Units 10/31/2018 10/08/2018 10/01/2018  Glucose 70 - 99 mg/dL 105(H) 98 94  BUN 6 - 20 mg/dL 9 8 8   Creatinine 0.44 - 1.00 mg/dL 0.65 0.74 0.64  Sodium 135 - 145 mmol/L 137 139 139  Potassium 3.5 - 5.1 mmol/L 3.9 3.9 3.8  Chloride 98 - 111 mmol/L 103 104 106  CO2 22 - 32 mmol/L 23 25 25   Calcium 8.9 - 10.3 mg/dL 9.6 9.5 9.3  Total Protein 6.5 - 8.1 g/dL - 7.3 6.9  Total Bilirubin 0.3 - 1.2 mg/dL - 0.3 0.2(L)  Alkaline Phos 38 - 126 U/L - 72 86  AST 15 - 41 U/L - 16 19  ALT 0 - 44 U/L - 26 49(H)    Lab Results  Component Value Date   WBC 10.8 (H) 10/31/2018   HGB 13.0 10/31/2018   HCT 40.3 10/31/2018   MCV 91.8 10/31/2018   PLT 385 10/31/2018   NEUTROABS 6.9 10/08/2018      Assessment Plan:  Malignant neoplasm of upper-outer quadrant of left breast in female, estrogen receptor positive ([place]) 05/13/2018:Evaluation of 5 months of thickening of the left breast upper outer quadrant. Initial mammogram revealed 2 cm irregular mass, ultrasound revealed 4 cm irregular mass with spiculated margin 2 o'clock position middle depth biopsy revealed invasive mammary carcinoma grade 3 that was ER PR positive HER-2 negative, left axillary lymph node biopsy positive for breast  cancer. Several lymph nodes were identified by ultrasound. T2 N1 stage IIb clinical stage  Treatment plan: 1.Neoadjuvant chemotherapy with dose dense Adriamycin and Cytoxan followed by Taxol weekly x12 completed 10/08/2018 2.Breast conserving surgery with targeted node dissection 11/05/2018 3.Adjuvant radiation therapy 4.Followed by adjuvant antiestrogen therapy(complete estrogen blockade with Zoladex plus aromatase inhibitor versus oophorectomy) ---------------------------------------------------------------------- 11/05/2018:Left lumpectomy [name]: residual invasive carcinoma s/p neoadjuvant treatment, HER-2 - (0), ER+ 100%, PR+ 60%, Ki67 10%, involved inferior and medial margins, and 8/9 left axillary lymph nodes positive for carcinoma.  Recommendation: Resection for positive margins 1.  Adjuvant radiation therapy followed by 2.  Complete estrogen blockade with Zoladex with anastrozole.  Patient is contemplating on getting oophorectomy at some point.  Return to clinic at the end of radiation.    I discussed the assessment and treatment plan with the patient. The patient was provided an opportunity to ask questions and all were answered. The patient agreed with the plan and demonstrated an understanding of the instructions. The patient was advised to call back or seek an in-person evaluation if the symptoms worsen or if the condition fails to improve as anticipated.   I provided 15 minutes of face-to-face Doximity time during this encounter.    Rulon Eisenmenger, MD 11/13/2018   I, Molly Dorshimer, am acting as scribe for [name], MD.  I have reviewed the above documentation for accuracy and completeness, and I agree with the above.

## 2018-11-13 ENCOUNTER — Other Ambulatory Visit: Payer: Self-pay

## 2018-11-13 ENCOUNTER — Inpatient Hospital Stay (HOSPITAL_BASED_OUTPATIENT_CLINIC_OR_DEPARTMENT_OTHER): Payer: BC Managed Care – PPO | Admitting: Hematology and Oncology

## 2018-11-13 ENCOUNTER — Telehealth: Payer: Self-pay | Admitting: *Deleted

## 2018-11-13 ENCOUNTER — Inpatient Hospital Stay: Payer: BC Managed Care – PPO | Admitting: Hematology and Oncology

## 2018-11-13 DIAGNOSIS — C50412 Malignant neoplasm of upper-outer quadrant of left female breast: Secondary | ICD-10-CM

## 2018-11-13 DIAGNOSIS — Z170 Estrogen receptor positive status [ER+]: Secondary | ICD-10-CM

## 2018-11-13 NOTE — Telephone Encounter (Signed)
Patient rescheduled appointment for 12/09/18 due to having more surgery.

## 2018-11-13 NOTE — Assessment & Plan Note (Signed)
05/13/2018:Evaluation of 5 months of thickening of the left breast upper outer quadrant. 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 Several lymph nodes were identified by ultrasound. T2 N1 stage IIb clinical stage  Treatment plan: 1.Neoadjuvant chemotherapy with dose dense Adriamycin and Cytoxan followed by Taxol weekly x12 completed 10/08/2018 2.Breast conserving surgery with targeted node dissection 11/05/2018 3.Adjuvant radiation therapy 4.Followed by adjuvant antiestrogen therapy(complete estrogen blockade with Zoladex plus aromatase inhibitor versus oophorectomy) ---------------------------------------------------------------------- 11/05/2018:Left lumpectomy Bethany Hanna): residual invasive carcinoma s/p neoadjuvant treatment, HER-2 - (0), ER+ 100%, PR+ 60%, Ki67 10%, involved inferior and medial margins, and 8/9 left axillary lymph nodes positive for carcinoma.  Recommendation: Resection for positive margins 1.  Adjuvant radiation therapy followed by 2.  Complete estrogen blockade with Zoladex with anastrozole.  Patient is contemplating on getting oophorectomy at some point.  Return to clinic at the end of radiation.

## 2018-11-14 ENCOUNTER — Other Ambulatory Visit (HOSPITAL_COMMUNITY)
Admission: RE | Admit: 2018-11-14 | Discharge: 2018-11-14 | Disposition: A | Discharge status: Home / Self Care | Payer: BC Managed Care – PPO | Source: Ambulatory Visit | Attending: General Surgery | Admitting: General Surgery

## 2018-11-14 DIAGNOSIS — Z01812 Encounter for preprocedural laboratory examination: Secondary | ICD-10-CM | POA: Diagnosis not present

## 2018-11-14 DIAGNOSIS — Z20828 Contact with and (suspected) exposure to other viral communicable diseases: Secondary | ICD-10-CM | POA: Insufficient documentation

## 2018-11-15 ENCOUNTER — Encounter (HOSPITAL_COMMUNITY): Payer: Self-pay | Admitting: *Deleted

## 2018-11-15 ENCOUNTER — Other Ambulatory Visit: Payer: Self-pay

## 2018-11-15 LAB — NOVEL CORONAVIRUS, NAA (HOSP ORDER, SEND-OUT TO REF LAB; TAT 18-24 HRS): SARS-CoV-2, NAA: NOT DETECTED

## 2018-11-15 NOTE — Progress Notes (Signed)
I spoke with Evonnie Pat, RN, at [facility] and Shela Leff at Dr. Cristal Generous office regarding the ERAS diet orders that were entered on Mrs. Batley. The orders for ERAS diet are for inpatient's, patient is an outpatient. Sunday Spillers said that she will try to get Dr. [name] to clarify the order. Mrs. Obeirne said that she had the Pre- Surgery drink last surgery and she was nauseous post op, patient said she is fine to not eat or drink after midnight.  I will check chart for clarification and call patient back if any changes are written.

## 2018-11-17 NOTE — Anesthesia Preprocedure Evaluation (Addendum)
Anesthesia Evaluation  Patient identified by MRN, date of birth, ID band Patient awake    Reviewed: Allergy & Precautions, H&P , NPO status , Patient's Chart, lab work & pertinent test results  Airway Mallampati: II  TM Distance: >3 FB Neck ROM: Full    Dental no notable dental hx. (+) Teeth Intact, Dental Advisory Given   Pulmonary neg pulmonary ROS,    Pulmonary exam normal breath sounds clear to auscultation       Cardiovascular hypertension,  Rhythm:Regular Rate:Normal     Neuro/Psych Anxiety negative neurological ROS  negative psych ROS   GI/Hepatic negative GI ROS, Neg liver ROS,   Endo/Other  negative endocrine ROS  Renal/GU negative Renal ROS  negative genitourinary   Musculoskeletal   Abdominal   Peds  Hematology negative hematology ROS (+)   Anesthesia Other Findings   Reproductive/Obstetrics negative OB ROS                            Anesthesia Physical  Anesthesia Plan  ASA: II  Anesthesia Plan: General   Post-op Pain Management:    Induction: Intravenous  PONV Risk Score and Plan: 4 or greater and Ondansetron, Dexamethasone, Midazolam and Treatment may vary due to age or medical condition  Airway Management Planned: LMA  Additional Equipment:   Intra-op Plan:   Post-operative Plan: Extubation in OR  Informed Consent: I have reviewed the patients History and Physical, chart, labs and discussed the procedure including the risks, benefits and alternatives for the proposed anesthesia with the patient or authorized representative who has indicated his/her understanding and acceptance.     Dental advisory given  Plan Discussed with: CRNA  Anesthesia Plan Comments:        Anesthesia Quick Evaluation

## 2018-11-18 ENCOUNTER — Ambulatory Visit (HOSPITAL_COMMUNITY)
Admission: RE | Admit: 2018-11-18 | Discharge: 2018-11-18 | Disposition: A | Discharge status: Home / Self Care | Payer: BC Managed Care – PPO | Attending: General Surgery | Admitting: General Surgery

## 2018-11-18 ENCOUNTER — Other Ambulatory Visit: Payer: Self-pay

## 2018-11-18 ENCOUNTER — Encounter (HOSPITAL_COMMUNITY)
Admission: RE | Disposition: A | Discharge status: Home / Self Care | Payer: Self-pay | Source: Home / Self Care | Attending: General Surgery

## 2018-11-18 ENCOUNTER — Ambulatory Visit (HOSPITAL_COMMUNITY): Payer: BC Managed Care – PPO | Admitting: Anesthesiology

## 2018-11-18 ENCOUNTER — Encounter (HOSPITAL_COMMUNITY): Payer: Self-pay | Admitting: *Deleted

## 2018-11-18 DIAGNOSIS — F419 Anxiety disorder, unspecified: Secondary | ICD-10-CM | POA: Diagnosis not present

## 2018-11-18 DIAGNOSIS — Z9221 Personal history of antineoplastic chemotherapy: Secondary | ICD-10-CM | POA: Diagnosis not present

## 2018-11-18 DIAGNOSIS — Z170 Estrogen receptor positive status [ER+]: Secondary | ICD-10-CM | POA: Diagnosis not present

## 2018-11-18 DIAGNOSIS — C50412 Malignant neoplasm of upper-outer quadrant of left female breast: Secondary | ICD-10-CM | POA: Diagnosis not present

## 2018-11-18 DIAGNOSIS — I10 Essential (primary) hypertension: Secondary | ICD-10-CM | POA: Insufficient documentation

## 2018-11-18 DIAGNOSIS — C50912 Malignant neoplasm of unspecified site of left female breast: Secondary | ICD-10-CM | POA: Diagnosis not present

## 2018-11-18 HISTORY — DX: Other specified postprocedural states: Z98.890

## 2018-11-18 HISTORY — PX: RE-EXCISION OF BREAST CANCER,SUPERIOR MARGINS: SHX6047

## 2018-11-18 HISTORY — DX: Nausea with vomiting, unspecified: R11.2

## 2018-11-18 LAB — CBC
HCT: 40 % (ref 36.0–46.0)
Hemoglobin: 12.9 g/dL (ref 12.0–15.0)
MCH: 29.5 pg (ref 26.0–34.0)
MCHC: 32.3 g/dL (ref 30.0–36.0)
MCV: 91.5 fL (ref 80.0–100.0)
Platelets: 479 10*3/uL — ABNORMAL HIGH (ref 150–400)
RBC: 4.37 MIL/uL (ref 3.87–5.11)
RDW: 12.9 % (ref 11.5–15.5)
WBC: 7.7 10*3/uL (ref 4.0–10.5)
nRBC: 0 % (ref 0.0–0.2)

## 2018-11-18 LAB — BASIC METABOLIC PANEL
Anion gap: 11 (ref 5–15)
BUN: 5 mg/dL — ABNORMAL LOW (ref 6–20)
CO2: 22 mmol/L (ref 22–32)
Calcium: 9.9 mg/dL (ref 8.9–10.3)
Chloride: 104 mmol/L (ref 98–111)
Creatinine, Ser: 0.71 mg/dL (ref 0.44–1.00)
GFR calc Af Amer: >60 mL/min (ref >60)
GFR calc non Af Amer: >60 mL/min (ref >60)
Glucose, Bld: 105 mg/dL — ABNORMAL HIGH (ref 70–99)
Potassium: 3.7 mmol/L (ref 3.5–5.1)
Sodium: 137 mmol/L (ref 135–145)

## 2018-11-18 LAB — POCT PREGNANCY, URINE: Preg Test, Ur: NEGATIVE

## 2018-11-18 SURGERY — RE-EXCISION OF BREAST CANCER,SUPERIOR MARGINS
Anesthesia: General | Site: Breast | Laterality: Left

## 2018-11-18 MED ORDER — MORPHINE SULFATE (PF) 2 MG/ML IV SOLN
2.0000 mg | INTRAVENOUS | Status: DC | PRN
  Administered 2018-11-18: 2 mg via INTRAVENOUS

## 2018-11-18 MED ORDER — ONDANSETRON HCL 4 MG/2ML IJ SOLN
INTRAMUSCULAR | Status: DC | PRN
  Administered 2018-11-18: 4 mg via INTRAVENOUS

## 2018-11-18 MED ORDER — MIDAZOLAM HCL 2 MG/2ML IJ SOLN
INTRAMUSCULAR | Status: AC
  Filled 2018-11-18: qty 2

## 2018-11-18 MED ORDER — LIDOCAINE 2% (20 MG/ML) 5 ML SYRINGE
INTRAMUSCULAR | Status: DC | PRN
  Administered 2018-11-18: 80 mg via INTRAVENOUS

## 2018-11-18 MED ORDER — MORPHINE SULFATE (PF) 4 MG/ML IV SOLN
INTRAVENOUS | Status: AC
  Administered 2018-11-18: 4 mg
  Filled 2018-11-18: qty 1

## 2018-11-18 MED ORDER — OXYCODONE HCL 5 MG PO TABS: 5.0000 mg | ORAL_TABLET | Freq: Four times a day (QID) | ORAL | 0 refills | Status: DC | PRN

## 2018-11-18 MED ORDER — FENTANYL CITRATE (PF) 100 MCG/2ML IJ SOLN
25.0000 ug | INTRAMUSCULAR | Status: DC | PRN
  Administered 2018-11-18 (×2): 50 ug via INTRAVENOUS

## 2018-11-18 MED ORDER — PROPOFOL 10 MG/ML IV BOLUS
INTRAVENOUS | Status: AC
  Filled 2018-11-18: qty 20

## 2018-11-18 MED ORDER — PROPOFOL 10 MG/ML IV BOLUS
INTRAVENOUS | Status: DC | PRN
  Administered 2018-11-18: 200 mg via INTRAVENOUS

## 2018-11-18 MED ORDER — ACETAMINOPHEN 500 MG PO TABS
1000.0000 mg | ORAL_TABLET | ORAL | Status: AC
  Administered 2018-11-18: 1000 mg via ORAL
  Filled 2018-11-18: qty 2

## 2018-11-18 MED ORDER — PROMETHAZINE HCL 25 MG/ML IJ SOLN: 6.2500 mg | INTRAMUSCULAR | Status: DC | PRN

## 2018-11-18 MED ORDER — LIDOCAINE 2% (20 MG/ML) 5 ML SYRINGE
INTRAMUSCULAR | Status: AC
  Filled 2018-11-18: qty 5

## 2018-11-18 MED ORDER — CEFAZOLIN SODIUM-DEXTROSE 2-4 GM/100ML-% IV SOLN
2.0000 g | INTRAVENOUS | Status: AC
  Administered 2018-11-18: 2 g via INTRAVENOUS
  Filled 2018-11-18: qty 100

## 2018-11-18 MED ORDER — DEXAMETHASONE SODIUM PHOSPHATE 10 MG/ML IJ SOLN
INTRAMUSCULAR | Status: DC | PRN
  Administered 2018-11-18: 10 mg via INTRAVENOUS

## 2018-11-18 MED ORDER — FENTANYL CITRATE (PF) 100 MCG/2ML IJ SOLN
INTRAMUSCULAR | Status: DC | PRN
  Administered 2018-11-18 (×4): 50 ug via INTRAVENOUS

## 2018-11-18 MED ORDER — ONDANSETRON HCL 4 MG/2ML IJ SOLN
INTRAMUSCULAR | Status: AC
  Filled 2018-11-18: qty 2

## 2018-11-18 MED ORDER — BUPIVACAINE-EPINEPHRINE 0.25% -1:200000 IJ SOLN
INTRAMUSCULAR | Status: DC | PRN
  Administered 2018-11-18: 16 mL

## 2018-11-18 MED ORDER — STERILE WATER FOR IRRIGATION IR SOLN
Status: DC | PRN
  Administered 2018-11-18: 1000 mL

## 2018-11-18 MED ORDER — FENTANYL CITRATE (PF) 100 MCG/2ML IJ SOLN
INTRAMUSCULAR | Status: AC
  Filled 2018-11-18: qty 2

## 2018-11-18 MED ORDER — LACTATED RINGERS IV SOLN
INTRAVENOUS | Status: DC | PRN
  Administered 2018-11-18: 07:00:00 via INTRAVENOUS

## 2018-11-18 MED ORDER — FENTANYL CITRATE (PF) 250 MCG/5ML IJ SOLN
INTRAMUSCULAR | Status: AC
  Filled 2018-11-18: qty 5

## 2018-11-18 MED ORDER — OXYCODONE HCL 5 MG PO TABS
ORAL_TABLET | ORAL | Status: AC
  Filled 2018-11-18: qty 2

## 2018-11-18 MED ORDER — BUPIVACAINE-EPINEPHRINE (PF) 0.25% -1:200000 IJ SOLN
INTRAMUSCULAR | Status: AC
  Filled 2018-11-18: qty 30

## 2018-11-18 MED ORDER — 0.9 % SODIUM CHLORIDE (POUR BTL) OPTIME
TOPICAL | Status: DC | PRN
  Administered 2018-11-18: 1000 mL

## 2018-11-18 MED ORDER — MIDAZOLAM HCL 5 MG/5ML IJ SOLN
INTRAMUSCULAR | Status: DC | PRN
  Administered 2018-11-18: 2 mg via INTRAVENOUS

## 2018-11-18 MED ORDER — DEXAMETHASONE SODIUM PHOSPHATE 10 MG/ML IJ SOLN
INTRAMUSCULAR | Status: AC
  Filled 2018-11-18: qty 1

## 2018-11-18 MED ORDER — GABAPENTIN 100 MG PO CAPS
100.0000 mg | ORAL_CAPSULE | ORAL | Status: AC
  Administered 2018-11-18: 100 mg via ORAL
  Filled 2018-11-18: qty 1

## 2018-11-18 MED ORDER — OXYCODONE HCL 5 MG PO TABS
5.0000 mg | ORAL_TABLET | ORAL | Status: DC | PRN
  Administered 2018-11-18: 10 mg via ORAL

## 2018-11-18 SURGICAL SUPPLY — 50 items
ADH SKN CLS APL DERMABOND .7 (GAUZE/BANDAGES/DRESSINGS) ×1
APL PRP STRL LF DISP 70% ISPRP (MISCELLANEOUS) ×1
APPLIER CLIP 9.375 MED OPEN (MISCELLANEOUS)
APR CLP MED 9.3 20 MLT OPN (MISCELLANEOUS)
BINDER BREAST LRG (GAUZE/BANDAGES/DRESSINGS) IMPLANT
BINDER BREAST XLRG (GAUZE/BANDAGES/DRESSINGS) ×1 IMPLANT
BLADE CLIPPER SURG (BLADE) IMPLANT
CANISTER SUCT 3000ML PPV (MISCELLANEOUS) IMPLANT
CHLORAPREP W/TINT 26 (MISCELLANEOUS) ×2 IMPLANT
CLIP APPLIE 9.375 MED OPEN (MISCELLANEOUS) IMPLANT
CLOSURE STERI-STRIP 1/4X4 (GAUZE/BANDAGES/DRESSINGS) ×1 IMPLANT
CONT SPEC 4OZ CLIKSEAL STRL BL (MISCELLANEOUS) IMPLANT
COVER SURGICAL LIGHT HANDLE (MISCELLANEOUS) ×2 IMPLANT
COVER WAND RF STERILE (DRAPES) ×2 IMPLANT
DECANTER SPIKE VIAL GLASS SM (MISCELLANEOUS) ×2 IMPLANT
DERMABOND ADVANCED (GAUZE/BANDAGES/DRESSINGS) ×1
DERMABOND ADVANCED .7 DNX12 (GAUZE/BANDAGES/DRESSINGS) ×1 IMPLANT
DEVICE DUBIN SPECIMEN MAMMOGRA (MISCELLANEOUS) IMPLANT
DRAPE LAPAROTOMY 100X72 PEDS (DRAPES) ×2 IMPLANT
DRSG OPSITE 4X5.5 SM (GAUZE/BANDAGES/DRESSINGS) ×2 IMPLANT
ELECT CAUTERY BLADE 6.4 (BLADE) ×2 IMPLANT
ELECT REM PT RETURN 9FT ADLT (ELECTROSURGICAL) ×2
ELECTRODE REM PT RTRN 9FT ADLT (ELECTROSURGICAL) ×1 IMPLANT
GAUZE SPONGE 4X4 12PLY STRL (GAUZE/BANDAGES/DRESSINGS) ×2 IMPLANT
GLOVE BIO SURGEON STRL SZ7 (GLOVE) ×3 IMPLANT
GLOVE BIOGEL PI IND STRL 7.5 (GLOVE) ×1 IMPLANT
GLOVE BIOGEL PI INDICATOR 7.5 (GLOVE) ×2
GOWN STRL REUS W/ TWL LRG LVL3 (GOWN DISPOSABLE) ×2 IMPLANT
GOWN STRL REUS W/TWL LRG LVL3 (GOWN DISPOSABLE) ×4
KIT BASIN OR (CUSTOM PROCEDURE TRAY) ×2 IMPLANT
KIT MARKER MARGIN INK (KITS) IMPLANT
KIT TURNOVER KIT B (KITS) ×2 IMPLANT
NDL HYPO 25GX1X1/2 BEV (NEEDLE) ×1 IMPLANT
NEEDLE HYPO 25GX1X1/2 BEV (NEEDLE) ×2 IMPLANT
NS IRRIG 1000ML POUR BTL (IV SOLUTION) ×2 IMPLANT
PACK GENERAL/GYN (CUSTOM PROCEDURE TRAY) ×2 IMPLANT
PAD ARMBOARD 7.5X6 YLW CONV (MISCELLANEOUS) ×4 IMPLANT
PENCIL SMOKE EVACUATOR (MISCELLANEOUS) ×1 IMPLANT
SPONGE LAP 4X18 RFD (DISPOSABLE) ×2 IMPLANT
STRIP CLOSURE SKIN 1/2X4 (GAUZE/BANDAGES/DRESSINGS) ×2 IMPLANT
SUT ETHILON 3 0 PS 1 (SUTURE) ×1 IMPLANT
SUT MNCRL AB 4-0 PS2 18 (SUTURE) ×2 IMPLANT
SUT SILK 2 0 SH (SUTURE) ×1 IMPLANT
SUT VIC AB 2-0 SH 27 (SUTURE) ×4
SUT VIC AB 2-0 SH 27X BRD (SUTURE) ×1 IMPLANT
SUT VIC AB 3-0 SH 27 (SUTURE) ×2
SUT VIC AB 3-0 SH 27XBRD (SUTURE) ×1 IMPLANT
SYR CONTROL 10ML LL (SYRINGE) ×2 IMPLANT
TOWEL GREEN STERILE (TOWEL DISPOSABLE) ×2 IMPLANT
TOWEL GREEN STERILE FF (TOWEL DISPOSABLE) ×2 IMPLANT

## 2018-11-18 NOTE — Op Note (Signed)
Preoperative diagnosis: Primary chemotherapy status post left breast seed bracketed lumpectomy and left axillary lymph node dissection with positive inferior and medial margins Postoperative diagnosis: Same as above Procedure: Left breast reexcision of inferior and medial margins Surgeon: Dr. Serita Grammes Estimated blood loss: Minimal Complications: None Drains: None Specimens: 1.  Medial margin marked short superior, long lateral, double deep 2.  Inferior margin marked short superior, long lateral, double deep Anesthesia: General Sponge needle count was correct at completion Disposition to recovery stable condition  Indications: This a 38 year old female who underwent primary chemotherapy followed by left breast seed bracketed lumpectomy and a left axillary lymph node dissection.  She has a positive inferior and medial margin after chemotherapy.  I discussed her options and we have elected to try to excise these margins to get them clear.  Procedure: After informed consent was obtained the patient was taken to the operating.  She was given antibiotics.  SCDs were in place.  She was placed under general anesthesia without complication.  She was prepped and draped in the standard sterile surgical fashion.  A surgical timeout was then performed.  I filtrated Marcaine throughout the region of her prior left upper outer quadrant incision.  I then reopened this incision.  I removed all the sutures that I used to close the tissue.  I evacuated a small seroma as well.  Once I done this I was able to identify all of the margins and the clip site placed previously.  I excised the medial and the inferior margins and marked them as above.  I then obtained hemostasis.  I irrigated the cavity.  I closed all of the breast tissue down with 2-0 Vicryl suture.  The skin was closed with 3-0 Vicryl and 4-0 Monocryl.  I placed a single 3-0 nylon at the inferior aspect of the incision.  Glue and Steri-Strips were  applied.  She tolerated this well was extubated and transferred to recovery stable.

## 2018-11-18 NOTE — Anesthesia Procedure Notes (Signed)
Procedure Name: LMA Insertion Date/Time: 11/18/2018 7:32 AM Performed by: Kyung Rudd, CRNA Pre-anesthesia Checklist: Patient identified, Emergency Drugs available, Suction available and Patient being monitored Patient Re-evaluated:Patient Re-evaluated prior to induction Oxygen Delivery Method: Circle system utilized Preoxygenation: Pre-oxygenation with 100% oxygen Induction Type: IV induction LMA: LMA inserted LMA Size: 4.0 Number of attempts: 1 Placement Confirmation: positive ETCO2 and breath sounds checked- equal and bilateral Tube secured with: Tape Dental Injury: Teeth and Oropharynx as per pre-operative assessment

## 2018-11-18 NOTE — Interval H&P Note (Signed)
History and Physical Interval Note:  11/18/2018 7:19 AM  Bethany Hanna  has presented today for surgery, with the diagnosis of LEFT BREAST CANCER.  The various methods of treatment have been discussed with the patient and family. After consideration of risks, benefits and other options for treatment, the patient has consented to  Procedure(s): RE-EXCISION OF LEFT BREAST MARGIN (Left) as a surgical intervention.  The patient's history has been reviewed, patient examined, no change in status, stable for surgery.  I have reviewed the patient's chart and labs.  Questions were answered to the patient's satisfaction.     [name]

## 2018-11-18 NOTE — Progress Notes (Signed)
Notified Dr. Deatra Canter of pt's blood pressure. No new orders.

## 2018-11-18 NOTE — Progress Notes (Signed)
wassted 2mg  morphine / witnessed by j williams rn

## 2018-11-18 NOTE — Anesthesia Postprocedure Evaluation (Signed)
Anesthesia Post Note  Patient: Bethany Hanna  Procedure(s) Performed: RE-EXCISION OF LEFT BREAST MARGIN (Left Breast)     Patient location during evaluation: PACU Anesthesia Type: General Level of consciousness: awake and alert Pain management: pain level controlled Vital Signs Assessment: post-procedure vital signs reviewed and stable Respiratory status: spontaneous breathing, nonlabored ventilation, respiratory function stable and patient connected to nasal cannula oxygen Cardiovascular status: blood pressure returned to baseline and stable Postop Assessment: no apparent nausea or vomiting Anesthetic complications: no    Last Vitals:  Vitals:   11/18/18 0915 11/18/18 0916  BP: (!) 134/96 (!) 134/96  Pulse: 91 89  Resp: (!) 47 16  Temp:    SpO2: 100% 99%    Last Pain:  Vitals:   11/18/18 0945  TempSrc:   PainSc: 4                  Tiajuana Amass

## 2018-11-18 NOTE — Discharge Instructions (Signed)
[facility] Office Phone Number [phone]  BREAST BIOPSY/ PARTIAL MASTECTOMY: POST OP INSTRUCTIONS Take 400 mg of ibuprofen every 8 hours or 650 mg tylenol every 6 hours for next 72 hours then as needed. Use ice several times daily also. Always review your discharge instruction sheet given to you by the facility where your surgery was performed.  IF YOU HAVE DISABILITY OR FAMILY LEAVE FORMS, YOU MUST BRING THEM TO THE OFFICE FOR PROCESSING.  DO NOT GIVE THEM TO YOUR DOCTOR.  1. A prescription for pain medication may be given to you upon discharge.  Take your pain medication as prescribed, if needed.  If narcotic pain medicine is not needed, then you may take acetaminophen (Tylenol), naprosyn (Alleve) or ibuprofen (Advil) as needed. 2. Take your usually prescribed medications unless otherwise directed 3. If you need a refill on your pain medication, please contact your pharmacy.  They will contact our office to request authorization.  Prescriptions will not be filled after 5pm or on week-ends. 4. You should eat very light the first 24 hours after surgery, such as soup, crackers, pudding, etc.  Resume your normal diet the day after surgery. 5. Most patients will experience some swelling and bruising in the breast.  Ice packs and a good support bra will help.  Wear the breast binder provided or a sports bra for 72 hours day and night.  After that wear a sports bra during the day until you return to the office. Swelling and bruising can take several days to resolve.  6. It is common to experience some constipation if taking pain medication after surgery.  Increasing fluid intake and taking a stool softener will usually help or prevent this problem from occurring.  A mild laxative (Milk of Magnesia or Miralax) should be taken according to package directions if there are no bowel movements after 48 hours. 7. Unless discharge instructions indicate otherwise, you may remove your bandages 48  hours after surgery and you may shower at that time.  You may have steri-strips (small skin tapes) in place directly over the incision.  These strips should be left on the skin for 7-10 days and will come off on their own.  If your surgeon used skin glue on the incision, you may shower in 24 hours.  The glue will flake off over the next 2-3 weeks.  Any sutures or staples will be removed at the office during your follow-up visit. 8. ACTIVITIES:  You may resume regular daily activities (gradually increasing) beginning the next day.  Wearing a good support bra or sports bra minimizes pain and swelling.  You may have sexual intercourse when it is comfortable. a. You may drive when you no longer are taking prescription pain medication, you can comfortably wear a seatbelt, and you can safely maneuver your car and apply brakes. b. RETURN TO WORK:  ______________________________________________________________________________________ 9. You should see your doctor in the office for a follow-up appointment approximately two weeks after your surgery.  Your doctor's nurse will typically make your follow-up appointment when she calls you with your pathology report.  Expect your pathology report 3-4 business days after your surgery.  You may call to check if you do not hear from us after three days. 10. OTHER INSTRUCTIONS: _______________________________________________________________________________________________ _____________________________________________________________________________________________________________________________________ _____________________________________________________________________________________________________________________________________ _____________________________________________________________________________________________________________________________________  WHEN TO CALL DR Gailen Venne: 1. Fever over 101.0 2. Nausea and/or vomiting. 3. Extreme swelling or  bruising. 4. Continued bleeding from incision. 5. Increased pain, redness, or drainage from the incision.  The clinic   staff is available to answer your questions during regular business hours.  Please don't hesitate to call and ask to speak to one of the nurses for clinical concerns.  If you have a medical emergency, go to the nearest emergency room or call 911.  A surgeon from [facility] is always on call at the hospital.  For further questions, please visit [email or web address]  

## 2018-11-18 NOTE — Transfer of Care (Signed)
Immediate Anesthesia Transfer of Care Note  Patient: Bethany Hanna  Procedure(s) Performed: RE-EXCISION OF LEFT BREAST MARGIN (Left Breast)  Patient Location: PACU  Anesthesia Type:General  Level of Consciousness: awake, alert  and oriented  Airway & Oxygen Therapy: Patient Spontanous Breathing and Patient connected to nasal cannula oxygen  Post-op Assessment: Report given to RN, Post -op Vital signs reviewed and stable and Patient moving all extremities  Post vital signs: Reviewed and stable  Last Vitals:  Vitals Value Taken Time  BP 148/100 11/18/18 0838  Temp 36.6 C 11/18/18 0838  Pulse 105 11/18/18 0840  Resp 27 11/18/18 0840  SpO2 100 % 11/18/18 0840  Vitals shown include unvalidated device data.  Last Pain:  Vitals:   11/18/18 0636  TempSrc:   PainSc: 0-No pain      Patients Stated Pain Goal: 3 ([id number])  Complications: No apparent anesthesia complications

## 2018-11-19 ENCOUNTER — Encounter (HOSPITAL_COMMUNITY): Payer: Self-pay | Admitting: General Surgery

## 2018-11-20 ENCOUNTER — Ambulatory Visit: Payer: BC Managed Care – PPO | Admitting: Radiation Oncology

## 2018-11-21 ENCOUNTER — Encounter: Payer: Self-pay | Admitting: *Deleted

## 2018-11-27 ENCOUNTER — Encounter: Payer: Self-pay | Admitting: *Deleted

## 2018-12-05 ENCOUNTER — Other Ambulatory Visit: Payer: Self-pay

## 2018-12-05 ENCOUNTER — Inpatient Hospital Stay: Payer: BC Managed Care – PPO | Attending: Hematology and Oncology

## 2018-12-05 VITALS — BP 135/97 | HR 91 | Temp 98.7°F | Resp 18

## 2018-12-05 DIAGNOSIS — C50412 Malignant neoplasm of upper-outer quadrant of left female breast: Secondary | ICD-10-CM | POA: Diagnosis not present

## 2018-12-05 DIAGNOSIS — Z95828 Presence of other vascular implants and grafts: Secondary | ICD-10-CM

## 2018-12-05 DIAGNOSIS — Z170 Estrogen receptor positive status [ER+]: Secondary | ICD-10-CM | POA: Diagnosis not present

## 2018-12-05 DIAGNOSIS — Z5111 Encounter for antineoplastic chemotherapy: Secondary | ICD-10-CM | POA: Insufficient documentation

## 2018-12-05 MED ORDER — GOSERELIN ACETATE 3.6 MG ~~LOC~~ IMPL
3.6000 mg | DRUG_IMPLANT | Freq: Once | SUBCUTANEOUS | Status: AC
  Administered 2018-12-05: 3.6 mg via SUBCUTANEOUS

## 2018-12-05 MED ORDER — GOSERELIN ACETATE 3.6 MG ~~LOC~~ IMPL
DRUG_IMPLANT | SUBCUTANEOUS | Status: AC
  Filled 2018-12-05: qty 3.6

## 2018-12-05 NOTE — Patient Instructions (Signed)

## 2018-12-09 ENCOUNTER — Ambulatory Visit
Admission: RE | Admit: 2018-12-09 | Discharge: 2018-12-09 | Disposition: A | Discharge status: Home / Self Care | Payer: BC Managed Care – PPO | Source: Ambulatory Visit | Attending: Radiation Oncology | Admitting: Radiation Oncology

## 2018-12-09 ENCOUNTER — Ambulatory Visit: Payer: BC Managed Care – PPO

## 2018-12-13 ENCOUNTER — Ambulatory Visit: Payer: BC Managed Care – PPO

## 2018-12-13 NOTE — Progress Notes (Signed)
Location of Breast Cancer: Malignant neoplasm of upper-outer quadrant of left breast in female, estrogen receptor positive   Histology per Pathology Report: 11/05/18:  Diagnosis 1. Breast, lumpectomy, Left w/seed - RESIDUAL INVASIVE CARCINOMA STATUS POST NEOADJUVANT TREATMENT. - INVASIVE CARCINOMA IS PRESENT AT THE INFERIOR AND MEDIAL MARGINS. - BIOPSY SITE CHANGES. - SEE ONCOLOGY TABLE. 2. Lymph nodes, regional resection, Left Axillary - METASTATIC CARCINOMA PRESENT IN (8) OF (9) LYMPH NODES.  11/18/18:  Diagnosis 1. Breast, excision, left medial margin - NO RESIDUAL CARCINOMA IDENTIFIED - PREVIOUS SURGICAL SITE CHANGES 2. Breast, excision, left inferior margin - RESIDUAL INVASIVE CARCINOMA - MARGINS UNINVOLVED BY CARCINOMA - PREVIOUS SURGICAL SITE CHANGES - SEE COMMENT Microscopic Comment 2. There are foci of residual invasive carcinoma involving 5 of 10 blocks. The margins are uninvolved by carcinoma.  Receptor Status: ER(100%), PR (60%), Her2-neu (negative, 0), Ki-(10%)  Did patient present with symptoms (if so, please note symptoms) or was this found on screening mammography?: Evaluation of 5 months of thickening of the left breast upper outer quadrant.  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  Several lymph nodes were identified by ultrasound.  T2 N1 stage IIb clinical stage  Past/Anticipated interventions by surgeon, if any: 11/05/18: Procedure: 1.  Right IJ port removal 2.  Left breast radioactive seed bracketed lumpectomy 3.  Left axillary lymph node dissection Surgeon: Dr. Matt Wakefield  11/18/18: Procedure: Left breast reexcision of inferior and medial margins Surgeon: Dr. Matt Wakefield  Past/Anticipated interventions by  medical oncology, if any: Chemotherapy 11/13/18 per Dr. Gudena:  Treatment plan: 1.Neoadjuvant chemotherapy with dose dense Adriamycin and Cytoxan followed by Taxol weekly x12 completed 10/08/2018 2.Breast conserving surgery with targeted node dissection 11/05/2018 3.Adjuvant radiation therapy 4.Followed by adjuvant antiestrogen therapy(complete estrogen blockade with Zoladex plus aromatase inhibitor versus oophorectomy) ---------------------------------------------------------------------- 11/05/2018:Left lumpectomy ([facility]): residual invasive carcinoma s/p neoadjuvant treatment, HER-2 - (0), ER+ 100%, PR+ 60%, Ki67 10%, involved inferior and medial margins, and 8/9 left axillary lymph nodes positive for carcinoma.  Recommendation: Resection for positive margins 1.  Adjuvant radiation therapy followed by 2.  Complete estrogen blockade with Zoladex with anastrozole.  Patient is contemplating on getting oophorectomy at some point.  Return to clinic at the end of radiation.  Lymphedema issues, if any: Pt continues to experience morning stiffness in LEFT shoulder. Pt able to perform all ADLs, including picking up toddler.   Pain issues, if any:  Pt denies c/o pain. Pt reports "one stitch" will be removed on Wednesday by Dr. Wakefield.   SAFETY ISSUES:  Prior radiation? No  Pacemaker/ICD? No  Possible current pregnancy? No  Is the patient on methotrexate? No  Current Complaints / other details:  Pt presents today for initial consult with Dr. Kinard for Radiation Oncology.  BP (!) 146/11 (BP Location: Left Arm, Patient Position: Sitting)   Pulse 92   Temp 98.2 F (36.8 C) (Temporal)   Resp 20   Ht 5' 8" (1.727 m)   Wt 210 lb 8 oz (95.5 kg)   SpO2 100%   BMI 32.01 kg/m   Wt Readings from Last 3 Encounters:  12/16/18 210 lb 8 oz (95.5 kg)  11/18/18 203 lb (92.1 kg)  11/05/18 208 lb (94.3 kg)       Jill W Pfefferkorn, RN 12/16/2018,8:12   AM

## 2018-12-15 NOTE — Progress Notes (Signed)
Radiation Oncology         [phone] ________________________________  Initial Outpatient Consultation  Name: Bethany Hanna MRN: [id number]  Date: 12/16/2018  DOB: [birth year]  BC:[id number], Bethany Mars, MD  [name], MD   REFERRING PHYSICIAN: [name], MD  DIAGNOSIS: The encounter diagnosis was Malignant neoplasm of upper-outer quadrant of left breast in female, estrogen receptor positive ([place]).  Stage IIB (ypT2, ypN2a) Left Breast UOQ, Invasive Ductal Carcinoma, ER+ / PR+ / Her2-, Grade 3  HISTORY OF PRESENT ILLNESS::Bethany Hanna is a 38 y.o. female who is accompanied by no one due to COVID-19 restrictions. She presented with a 5 month history of thickening in the upper-outer quadrant of her left breast. She underwent bilateral diagnostic mammography and left breast ultrasonography on 05/13/2018 showing: a 4 cm irregular mass in the left breast at 2 o'clock; various oval enlarged lymph nodes in the left axillary tail.  Biopsy on 05/13/2018 showed: invasive mammary carcinoma at the 2 o'clock mass, at grade 3, and in the biopsied lymph node. The lymph node showed no residual lymphoid tissue and had similar morphology to the mass. E-cadherin returned positive for a ductal phenotype. Prognostic indicators significant for: estrogen receptor, 95% positive and progesterone receptor, 40% positive. Proliferation marker Ki67 at 50%. HER2 negative by FISH.  The patient met with Dr. Lindi Adie on 05/16/2018, who recommended neoadjuvant chemotherapy followed by breast conserving surgery, adjuvant radiation therapy, then adjuvant antiestrogen therapy. Staging abdomen/pelvis CT and bone scan performed on 05/23/2018 were negative for metastatic disease, although a nonspecific foci of questionable increased tracer accumulation was noted at the right supraorbital region and at the posterior left 4th rib.  She received chemotherapy consisting of Adriamycin and Cytoxan from 05/28/2018 to 07/09/2018, followed  by weekly taxol from 07/23/2018 to 10/08/2018.  She underwent genetic counseling on 08/01/2018, which yielded negative genetic testing results.  Post-treatment bilateral breast MRI was performed on 10/07/2018 and showed: significantly improved appearance of known left breast malignancy, non mass enhancement persists measuring 3.7 cm; significant improvement in left axillary lymph nodes, of the 6 previously seen abnormal lymph nodes only one continued to have mild cortical thickening.  She proceeded to left breast lumpectomy  axillary node dissection on 11/05/2018, which showed: 3.5 cm of residual invasive ductal carcinoma; medial and inferior margins involved by carcinoma; eight lymph nodes were found to have metastatic carcinoma (8/9). Repeat prognostic panel showed: ER 100%, PR 60%, Ki67 at 10%, Her2 negative.  She underwent re-excision of the inferior and medial margins on 11/18/2018. Pathology showed clear final margins.  PREVIOUS RADIATION THERAPY: No  PAST MEDICAL HISTORY:  Past Medical History:  Diagnosis Date  . Anxiety    SITUational -uses xanax during the day, reports the ativan is for bedtime  . Cancer ([place]) 05/05/2018   Breast caner  . Complication of anesthesia   . Family history of colon cancer   . Family history of melanoma   . Headache    Migraine none since 2001  . Hypertension    white coat syndrome , per pt.   Marland Kitchen PONV (postoperative nausea and vomiting)     PAST SURGICAL HISTORY: Past Surgical History:  Procedure Laterality Date  . BREAST LUMPECTOMY Left 11/05/2018  . BREAST LUMPECTOMY WITH RADIOACTIVE SEED AND AXILLARY LYMPH NODE DISSECTION Left 11/05/2018   Procedure: LEFT BREAST LUMPECTOMY WITH BRACKETED RADIOACTIVE SEED AND LEFT AXILLARY LYMPH NODE DISSECTION;  Surgeon: [name], MD;  Location: [facility];  Service: General;  Laterality: Left;  . CHOLECYSTECTOMY  N/A 04/09/2015   Procedure: LAPAROSCOPIC CHOLECYSTECTOMY WITH INTRAOPERATIVE CHOLANGIOGRAM;  Surgeon: Autumn Messing III, MD;  Location: [facility];  Service: General;  Laterality: N/A;  . DILATATION & CURRETTAGE/HYSTEROSCOPY WITH RESECTOCOPE N/A 07/31/2013   Procedure: [place];  Surgeon: Princess Bruins, MD;  Location: [facility];  Service: Gynecology;  Laterality: N/A;  1 hr.  Marland Kitchen DILATION AND EVACUATION N/A 12/12/2017   Procedure: DILATATION AND EVACUATION (D&E) 2ND TRIMESTER;  Surgeon: Brien Few, MD;  Location: [facility];  Service: Gynecology;  Laterality: N/A;  . OPERATIVE ULTRASOUND N/A 12/12/2017   Procedure: OPERATIVE ULTRASOUND;  Surgeon: Brien Few, MD;  Location: [facility];  Service: Gynecology;  Laterality: N/A;  . PORT-A-CATH REMOVAL Right 11/05/2018   Procedure: REMOVAL PORT-A-CATH;  Surgeon: [name], MD;  Location: [facility];  Service: General;  Laterality: Right;  . PORTACATH PLACEMENT Right 05/27/2018   Procedure: INSERTION PORT-A-CATH WITH ULTRASOUND;  Surgeon: [name], MD;  Location: [facility];  Service: General;  Laterality: Right;  GENERAL AND LMA  . RE-EXCISION OF BREAST CANCER,SUPERIOR MARGINS Left 11/18/2018   Procedure: RE-EXCISION OF LEFT BREAST MARGIN;  Surgeon: [name], MD;  Location: [facility];  Service: General;  Laterality: Left;    FAMILY HISTORY:  Family History  Problem Relation Age of Onset  . Arthritis Mother   . Migraines Mother   . ADD / ADHD Mother   . Varicose Veins Mother   . Hypertension Father   . Alcohol abuse Brother   . Varicose Veins Brother   . Stroke Maternal Grandmother   . Melanoma Maternal Grandmother   . Pulmonary fibrosis Maternal Grandfather   . Mitral valve prolapse Paternal Grandmother   . Migraines Paternal Grandmother   . Lymphoma Paternal Grandfather        was treated for this and did ok  . Heart failure Paternal Grandfather   . Eczema Son   . Colon cancer Maternal Aunt 43    SOCIAL HISTORY:  Social History   Tobacco Use  . Smoking status: Never  Smoker  . Smokeless tobacco: Never Used  Substance Use Topics  . Alcohol use: Yes    Alcohol/week: 2.0 - 3.0 standard drinks    Types: 2 - 3 Glasses of wine per week  . Drug use: No    ALLERGIES: No Known Allergies  MEDICATIONS:  Current Outpatient Medications  Medication Sig Dispense Refill  . acetaminophen (TYLENOL) 500 MG tablet Take 1,000 mg by mouth every 6 (six) hours as needed.    . ALPRAZolam (XANAX) 0.5 MG tablet Take 1 tablet (0.5 mg total) by mouth at bedtime as needed for anxiety. (Patient taking differently: Take 0.5 mg by mouth daily as needed for anxiety. ) 60 tablet 3  . gabapentin (NEURONTIN) 100 MG capsule Take 1 capsule (100 mg total) by mouth at bedtime. 30 capsule 1  . Goserelin Acetate (ZOLADEX ) Inject into the skin every 28 (twenty-eight) days.    Marland Kitchen ibuprofen (ADVIL) 200 MG tablet Take 600 mg by mouth every 6 (six) hours as needed.    Marland Kitchen LORazepam (ATIVAN) 0.5 MG tablet TAKE 1 TABLET(0.5 MG) BY MOUTH AT BEDTIME AS NEEDED FOR NAUSEA OR VOMITING (Patient taking differently: Take 0.5 mg by mouth at bedtime as needed for sleep. ) 30 tablet 0  . oxyCODONE (OXY IR/ROXICODONE) 5 MG immediate release tablet Take 1 tablet (5 mg total) by mouth every 6 (six) hours as needed for moderate pain, severe pain or breakthrough pain. 10  tablet 0  . traMADol (ULTRAM) 50 MG tablet Take by mouth every 6 (six) hours as needed.     No current facility-administered medications for this encounter.     REVIEW OF SYSTEMS:  A 10+ POINT REVIEW OF SYSTEMS WAS OBTAINED including neurology, dermatology, psychiatry, cardiac, respiratory, lymph, extremities, GI, GU, musculoskeletal, constitutional, reproductive, HEENT.    PHYSICAL EXAM:  height is 5' 8"  (1.727 m) and weight is 210 lb 8 oz (95.5 kg). Her temporal temperature is 98.2 F (36.8 C). Her blood pressure is 146/11 (abnormal) and her pulse is 92. Her respiration is 20 and oxygen saturation is 100%.   General: Alert and oriented, in no  acute distress HEENT: Head is normocephalic. Extraocular movements are intact. Oropharynx is clear. Neck: Neck is supple, no palpable cervical or supraclavicular lymphadenopathy. Heart: Regular in rate and rhythm with no murmurs, rubs, or gallops. Chest: Clear to auscultation bilaterally, with no rhonchi, wheezes, or rales. Abdomen: Soft, nontender, nondistended, with no rigidity or guarding. Extremities: No cyanosis or edema. Lymphatics: see Neck Exam Skin: No concerning lesions. Musculoskeletal: symmetric strength and muscle tone throughout. Neurologic: Cranial nerves II through XII are grossly intact. No obvious focalities. Speech is fluent. Coordination is intact. Psychiatric: Judgment and insight are intact. Affect is appropriate. Right Breast: no palpable mass, nipple discharge or bleeding. Left Breast: The patient has a well-healing scar in the upper outer quadrant.  No signs of drainage or infection.  Separate scar in the axillary region from her axillary node dissection.  Also healing well without signs of drainage or infection.  patient has good range of motion movement in her left arm and shoulder.  ECOG = 1  0 - Asymptomatic (Fully active, able to carry on all predisease activities without restriction)  1 - Symptomatic but completely ambulatory (Restricted in physically strenuous activity but ambulatory and able to carry out work of a light or sedentary nature. For example, light housework, office work)  2 - Symptomatic, <50% in bed during the day (Ambulatory and capable of all self care but unable to carry out any work activities. Up and about more than 50% of waking hours)  3 - Symptomatic, >50% in bed, but not bedbound (Capable of only limited self-care, confined to bed or chair 50% or more of waking hours)  4 - Bedbound (Completely disabled. Cannot carry on any self-care. Totally confined to bed or chair)  5 - Death   Eustace Pen MM, Creech RH, Tormey DC, et al. [phone]).  "Toxicity and response criteria of the [facility]". [place] Oncol. 5 (6): 649-55  LABORATORY DATA:  Lab Results  Component Value Date   WBC 7.7 11/18/2018   HGB 12.9 11/18/2018   HCT 40.0 11/18/2018   MCV 91.5 11/18/2018   PLT 479 (H) 11/18/2018   NEUTROABS 6.9 10/08/2018   Lab Results  Component Value Date   NA 137 11/18/2018   K 3.7 11/18/2018   CL 104 11/18/2018   CO2 22 11/18/2018   GLUCOSE 105 (H) 11/18/2018   CREATININE 0.71 11/18/2018   CALCIUM 9.9 11/18/2018      RADIOGRAPHY: No results found.    IMPRESSION: Stage IIB (ypT2, ypN2a) Left Breast UOQ, Invasive Ductal Carcinoma, ER+ / PR+ / Her2-, Grade 3  Patient will be a good candidate for breast conservation with radiation therapy directed at the left breast.  I would also recommend elective coverage of the axillary area given her biopsy-proven lymph node involvement prior to neoadjuvant chemotherapy as  well as significant residual disease burden in the axilla after her neoadjuvant chemotherapy.  Today, I talked to the patient  about the findings and work-up thus far.  We discussed the natural history of breast cancer and general treatment, highlighting the role of radiotherapy in the management.  We discussed the available radiation techniques, and focused on the details of logistics and delivery.  We reviewed the anticipated acute and late sequelae associated with radiation in this setting.  The patient was encouraged to ask questions that I answered to the best of my ability.  A patient consent form was discussed and signed.  We retained a copy for our records.  The patient would like to proceed with radiation and will be scheduled for CT simulation.  PLAN: Patient is scheduled for CT simulation on October 15 with treatments to begin approximately a week later ( ~6 weeks postop).  Patient will receive 5-1/2 weeks of radiation therapy directed to the left breast and axillary area.  She will then  proceed with a boost to the lumpectomy cavity of 12 Gy in 6 fractions.  We will use cardiac sparing techniques if necessary    ------------------------------------------------  Blair Promise, PhD, MD  This document serves as a record of services personally performed by [name], MD. It was created on his behalf by Wilburn Mylar, a trained medical scribe. The creation of this record is based on the scribe's personal observations and the provider's statements to them. This document has been checked and approved by the attending provider.

## 2018-12-16 ENCOUNTER — Ambulatory Visit
Admission: RE | Admit: 2018-12-16 | Discharge: 2018-12-16 | Disposition: A | Discharge status: Home / Self Care | Payer: BC Managed Care – PPO | Source: Ambulatory Visit | Attending: Radiation Oncology | Admitting: Radiation Oncology

## 2018-12-16 ENCOUNTER — Encounter: Payer: Self-pay | Admitting: Radiation Oncology

## 2018-12-16 ENCOUNTER — Other Ambulatory Visit: Payer: Self-pay

## 2018-12-16 VITALS — BP 146/11 | HR 92 | Temp 98.2°F | Resp 20 | Ht 68.0 in | Wt 210.5 lb

## 2018-12-16 DIAGNOSIS — Z79899 Other long term (current) drug therapy: Secondary | ICD-10-CM | POA: Insufficient documentation

## 2018-12-16 DIAGNOSIS — C50412 Malignant neoplasm of upper-outer quadrant of left female breast: Secondary | ICD-10-CM | POA: Insufficient documentation

## 2018-12-16 DIAGNOSIS — C773 Secondary and unspecified malignant neoplasm of axilla and upper limb lymph nodes: Secondary | ICD-10-CM | POA: Insufficient documentation

## 2018-12-16 DIAGNOSIS — Z9889 Other specified postprocedural states: Secondary | ICD-10-CM | POA: Diagnosis not present

## 2018-12-16 DIAGNOSIS — I10 Essential (primary) hypertension: Secondary | ICD-10-CM | POA: Insufficient documentation

## 2018-12-16 DIAGNOSIS — Z923 Personal history of irradiation: Secondary | ICD-10-CM | POA: Diagnosis not present

## 2018-12-16 DIAGNOSIS — Z9221 Personal history of antineoplastic chemotherapy: Secondary | ICD-10-CM | POA: Insufficient documentation

## 2018-12-16 DIAGNOSIS — Z170 Estrogen receptor positive status [ER+]: Secondary | ICD-10-CM | POA: Diagnosis not present

## 2018-12-16 DIAGNOSIS — F419 Anxiety disorder, unspecified: Secondary | ICD-10-CM | POA: Diagnosis not present

## 2018-12-16 DIAGNOSIS — Z800 Family history of malignant neoplasm of digestive organs: Secondary | ICD-10-CM | POA: Diagnosis not present

## 2018-12-16 NOTE — Patient Instructions (Signed)
Coronavirus (COVID-19) Are you at risk?  Are you at risk for the Coronavirus (COVID-19)?  To be considered HIGH RISK for Coronavirus (COVID-19), you have to meet the following criteria:  . Traveled to [place], [place], [place], [place] or [place]; or in the [place] to [place], [place], [place], or [place]; and have fever, cough, and shortness of breath within the last 2 weeks of travel OR . Been in close contact with a person diagnosed with COVID-19 within the last 2 weeks and have fever, cough, and shortness of breath . IF YOU DO NOT MEET THESE CRITERIA, YOU ARE CONSIDERED LOW RISK FOR COVID-19.  What to do if you are HIGH RISK for COVID-19?  . If you are having a medical emergency, call 911. . Seek medical care right away. Before you go to a doctor's office, urgent care or emergency department, call ahead and tell them about your recent travel, contact with someone diagnosed with COVID-19, and your symptoms. You should receive instructions from your physician's office regarding next steps of care.  . When you arrive at healthcare provider, tell the healthcare staff immediately you have returned from visiting [place], [place], [place], [place] or [place]; or traveled in the [place] to [place], [place], [place], or [place]; in the last two weeks or you have been in close contact with a person diagnosed with COVID-19 in the last 2 weeks.   . Tell the health care staff about your symptoms: fever, cough and shortness of breath. . After you have been seen by a medical provider, you will be either: o Tested for (COVID-19) and discharged home on quarantine except to seek medical care if symptoms worsen, and asked to  - Stay home and avoid contact with others until you get your results (4-5 days)  - Avoid travel on public transportation if possible (such as bus, train, or airplane) or o Sent to the Emergency Department by EMS for evaluation, COVID-19 testing, and possible  admission depending on your condition and test results.  What to do if you are LOW RISK for COVID-19?  Reduce your risk of any infection by using the same precautions used for avoiding the common cold or flu:  . Wash your hands often with soap and warm water for at least 20 seconds.  If soap and water are not readily available, use an alcohol-based hand sanitizer with at least 60% alcohol.  . If coughing or sneezing, cover your mouth and nose by coughing or sneezing into the elbow areas of your shirt or coat, into a tissue or into your sleeve (not your hands). . Avoid shaking hands with others and consider head nods or verbal greetings only. . Avoid touching your eyes, nose, or mouth with unwashed hands.  . Avoid close contact with people who are sick. . Avoid places or events with large numbers of people in one location, like concerts or sporting events. . Carefully consider travel plans you have or are making. . If you are planning any travel outside or inside the [place], visit the CDC's Travelers' Health webpage for the latest health notices. . If you have some symptoms but not all symptoms, continue to monitor at home and seek medical attention if your symptoms worsen. . If you are having a medical emergency, call 911.   ADDITIONAL HEALTHCARE OPTIONS FOR PATIENTS  [facility] / e-Visit: [email or web address]         [facility]: [phone]  [facility]: [phone]                   [facility]: [phone]   

## 2018-12-19 ENCOUNTER — Encounter: Payer: Self-pay | Admitting: *Deleted

## 2018-12-19 ENCOUNTER — Other Ambulatory Visit: Payer: Self-pay

## 2018-12-19 ENCOUNTER — Ambulatory Visit
Admission: RE | Admit: 2018-12-19 | Discharge: 2018-12-19 | Disposition: A | Discharge status: Home / Self Care | Payer: BC Managed Care – PPO | Source: Ambulatory Visit | Attending: Radiation Oncology | Admitting: Radiation Oncology

## 2018-12-19 DIAGNOSIS — C50412 Malignant neoplasm of upper-outer quadrant of left female breast: Secondary | ICD-10-CM | POA: Insufficient documentation

## 2018-12-19 DIAGNOSIS — Z510 Encounter for antineoplastic radiation therapy: Secondary | ICD-10-CM | POA: Diagnosis not present

## 2018-12-19 DIAGNOSIS — Z170 Estrogen receptor positive status [ER+]: Secondary | ICD-10-CM | POA: Insufficient documentation

## 2018-12-20 ENCOUNTER — Ambulatory Visit: Payer: BC Managed Care – PPO

## 2018-12-23 ENCOUNTER — Other Ambulatory Visit: Payer: Self-pay | Admitting: *Deleted

## 2018-12-23 ENCOUNTER — Encounter: Payer: Self-pay | Admitting: *Deleted

## 2018-12-23 MED ORDER — ALPRAZOLAM 0.5 MG PO TABS: 0.5000 mg | ORAL_TABLET | Freq: Every evening | ORAL | 3 refills | Status: DC | PRN

## 2018-12-24 ENCOUNTER — Telehealth: Payer: Self-pay | Admitting: Hematology and Oncology

## 2018-12-24 ENCOUNTER — Other Ambulatory Visit: Payer: Self-pay | Admitting: *Deleted

## 2018-12-24 DIAGNOSIS — C50412 Malignant neoplasm of upper-outer quadrant of left female breast: Secondary | ICD-10-CM | POA: Diagnosis not present

## 2018-12-24 DIAGNOSIS — Z510 Encounter for antineoplastic radiation therapy: Secondary | ICD-10-CM | POA: Diagnosis not present

## 2018-12-24 DIAGNOSIS — Z170 Estrogen receptor positive status [ER+]: Secondary | ICD-10-CM

## 2018-12-24 MED ORDER — LORAZEPAM 0.5 MG PO TABS: ORAL_TABLET | ORAL | 0 refills | Status: DC

## 2018-12-24 NOTE — Telephone Encounter (Signed)
Called patient regarding new appointments scheduled per 10/15 schedule message, left a voicemail.

## 2018-12-26 ENCOUNTER — Other Ambulatory Visit: Payer: Self-pay

## 2018-12-26 ENCOUNTER — Ambulatory Visit
Admission: RE | Admit: 2018-12-26 | Discharge: 2018-12-26 | Disposition: A | Discharge status: Home / Self Care | Payer: BC Managed Care – PPO | Source: Ambulatory Visit | Attending: Radiation Oncology | Admitting: Radiation Oncology

## 2018-12-26 DIAGNOSIS — C50412 Malignant neoplasm of upper-outer quadrant of left female breast: Secondary | ICD-10-CM

## 2018-12-26 DIAGNOSIS — Z170 Estrogen receptor positive status [ER+]: Secondary | ICD-10-CM | POA: Diagnosis not present

## 2018-12-26 DIAGNOSIS — Z510 Encounter for antineoplastic radiation therapy: Secondary | ICD-10-CM | POA: Diagnosis not present

## 2018-12-26 NOTE — Progress Notes (Signed)
  Radiation Oncology         [phone] ________________________________  Name: Bethany Hanna MRN: [id number]  Date: 12/26/2018  DOB: 1980-09-24  Simulation Verification Note    ICD-10-CM   1. Malignant neoplasm of upper-outer quadrant of left breast in female, estrogen receptor positive ([place])  C50.412    Z17.0     Status: outpatient  NARRATIVE: The patient was brought to the treatment unit and placed in the planned treatment position. The clinical setup was verified. Then port films were obtained and uploaded to the radiation oncology medical record software.  The treatment beams were carefully compared against the planned radiation fields. The position location and shape of the radiation fields was reviewed. They targeted volume of tissue appears to be appropriately covered by the radiation beams. Organs at risk appear to be excluded as planned.  Based on my personal review, I approved the simulation verification. The patient's treatment will proceed as planned.  -----------------------------------  Blair Promise, PhD, MD

## 2018-12-27 ENCOUNTER — Other Ambulatory Visit: Payer: Self-pay | Admitting: *Deleted

## 2018-12-27 NOTE — Telephone Encounter (Signed)
Pt xanax script was not sent to pharmacy for refill as pt has one more refill. Therefore pt did not have a refill on xanax on 10/19 as stated under medications.

## 2018-12-30 ENCOUNTER — Encounter: Payer: Self-pay | Admitting: *Deleted

## 2018-12-30 ENCOUNTER — Ambulatory Visit
Admission: RE | Admit: 2018-12-30 | Discharge: 2018-12-30 | Disposition: A | Discharge status: Home / Self Care | Payer: BC Managed Care – PPO | Source: Ambulatory Visit | Attending: Radiation Oncology | Admitting: Radiation Oncology

## 2018-12-30 ENCOUNTER — Other Ambulatory Visit: Payer: Self-pay

## 2018-12-30 DIAGNOSIS — C50412 Malignant neoplasm of upper-outer quadrant of left female breast: Secondary | ICD-10-CM

## 2018-12-30 DIAGNOSIS — Z510 Encounter for antineoplastic radiation therapy: Secondary | ICD-10-CM | POA: Diagnosis not present

## 2018-12-30 DIAGNOSIS — Z170 Estrogen receptor positive status [ER+]: Secondary | ICD-10-CM

## 2018-12-30 MED ORDER — RADIAPLEXRX EX GEL
Freq: Once | CUTANEOUS | Status: AC
  Administered 2018-12-30: 12:00:00 via TOPICAL

## 2018-12-31 ENCOUNTER — Other Ambulatory Visit: Payer: Self-pay

## 2018-12-31 ENCOUNTER — Ambulatory Visit
Admission: RE | Admit: 2018-12-31 | Discharge: 2018-12-31 | Disposition: A | Discharge status: Home / Self Care | Payer: BC Managed Care – PPO | Source: Ambulatory Visit | Attending: Radiation Oncology | Admitting: Radiation Oncology

## 2018-12-31 DIAGNOSIS — Z510 Encounter for antineoplastic radiation therapy: Secondary | ICD-10-CM | POA: Diagnosis not present

## 2018-12-31 DIAGNOSIS — Z170 Estrogen receptor positive status [ER+]: Secondary | ICD-10-CM | POA: Diagnosis not present

## 2018-12-31 DIAGNOSIS — C50412 Malignant neoplasm of upper-outer quadrant of left female breast: Secondary | ICD-10-CM | POA: Diagnosis not present

## 2019-01-01 ENCOUNTER — Ambulatory Visit
Admission: RE | Admit: 2019-01-01 | Discharge: 2019-01-01 | Disposition: A | Discharge status: Home / Self Care | Payer: BC Managed Care – PPO | Source: Ambulatory Visit | Attending: Radiation Oncology | Admitting: Radiation Oncology

## 2019-01-01 ENCOUNTER — Other Ambulatory Visit: Payer: Self-pay

## 2019-01-01 DIAGNOSIS — Z170 Estrogen receptor positive status [ER+]: Secondary | ICD-10-CM | POA: Diagnosis not present

## 2019-01-01 DIAGNOSIS — Z510 Encounter for antineoplastic radiation therapy: Secondary | ICD-10-CM | POA: Diagnosis not present

## 2019-01-01 DIAGNOSIS — C50412 Malignant neoplasm of upper-outer quadrant of left female breast: Secondary | ICD-10-CM | POA: Diagnosis not present

## 2019-01-02 ENCOUNTER — Other Ambulatory Visit: Payer: Self-pay

## 2019-01-02 ENCOUNTER — Ambulatory Visit
Admission: RE | Admit: 2019-01-02 | Discharge: 2019-01-02 | Disposition: A | Discharge status: Home / Self Care | Payer: BC Managed Care – PPO | Source: Ambulatory Visit | Attending: Radiation Oncology | Admitting: Radiation Oncology

## 2019-01-02 DIAGNOSIS — C50412 Malignant neoplasm of upper-outer quadrant of left female breast: Secondary | ICD-10-CM | POA: Diagnosis not present

## 2019-01-02 DIAGNOSIS — Z510 Encounter for antineoplastic radiation therapy: Secondary | ICD-10-CM | POA: Diagnosis not present

## 2019-01-02 DIAGNOSIS — Z170 Estrogen receptor positive status [ER+]: Secondary | ICD-10-CM | POA: Diagnosis not present

## 2019-01-03 ENCOUNTER — Other Ambulatory Visit: Payer: Self-pay

## 2019-01-03 ENCOUNTER — Ambulatory Visit
Admission: RE | Admit: 2019-01-03 | Discharge: 2019-01-03 | Disposition: A | Discharge status: Home / Self Care | Payer: BC Managed Care – PPO | Source: Ambulatory Visit | Attending: Radiation Oncology | Admitting: Radiation Oncology

## 2019-01-03 DIAGNOSIS — Z170 Estrogen receptor positive status [ER+]: Secondary | ICD-10-CM | POA: Diagnosis not present

## 2019-01-03 DIAGNOSIS — C50412 Malignant neoplasm of upper-outer quadrant of left female breast: Secondary | ICD-10-CM | POA: Diagnosis not present

## 2019-01-03 DIAGNOSIS — Z510 Encounter for antineoplastic radiation therapy: Secondary | ICD-10-CM | POA: Diagnosis not present

## 2019-01-06 ENCOUNTER — Other Ambulatory Visit: Payer: Self-pay

## 2019-01-06 ENCOUNTER — Inpatient Hospital Stay: Payer: BC Managed Care – PPO | Attending: Hematology and Oncology

## 2019-01-06 ENCOUNTER — Ambulatory Visit
Admission: RE | Admit: 2019-01-06 | Discharge: 2019-01-06 | Disposition: A | Discharge status: Home / Self Care | Payer: BC Managed Care – PPO | Source: Ambulatory Visit | Attending: Radiation Oncology | Admitting: Radiation Oncology

## 2019-01-06 VITALS — BP 163/114 | HR 83 | Temp 98.5°F | Resp 18

## 2019-01-06 DIAGNOSIS — Z170 Estrogen receptor positive status [ER+]: Secondary | ICD-10-CM | POA: Insufficient documentation

## 2019-01-06 DIAGNOSIS — Z510 Encounter for antineoplastic radiation therapy: Secondary | ICD-10-CM | POA: Insufficient documentation

## 2019-01-06 DIAGNOSIS — Z5111 Encounter for antineoplastic chemotherapy: Secondary | ICD-10-CM | POA: Insufficient documentation

## 2019-01-06 DIAGNOSIS — C778 Secondary and unspecified malignant neoplasm of lymph nodes of multiple regions: Secondary | ICD-10-CM | POA: Diagnosis not present

## 2019-01-06 DIAGNOSIS — Z79899 Other long term (current) drug therapy: Secondary | ICD-10-CM | POA: Diagnosis not present

## 2019-01-06 DIAGNOSIS — C50412 Malignant neoplasm of upper-outer quadrant of left female breast: Secondary | ICD-10-CM

## 2019-01-06 DIAGNOSIS — Z95828 Presence of other vascular implants and grafts: Secondary | ICD-10-CM

## 2019-01-06 MED ORDER — GOSERELIN ACETATE 3.6 MG ~~LOC~~ IMPL
3.6000 mg | DRUG_IMPLANT | Freq: Once | SUBCUTANEOUS | Status: AC
  Administered 2019-01-06: 3.6 mg via SUBCUTANEOUS

## 2019-01-06 MED ORDER — GOSERELIN ACETATE 3.6 MG ~~LOC~~ IMPL
DRUG_IMPLANT | SUBCUTANEOUS | Status: AC
  Filled 2019-01-06: qty 3.6

## 2019-01-06 NOTE — Patient Instructions (Signed)

## 2019-01-07 ENCOUNTER — Other Ambulatory Visit: Payer: Self-pay

## 2019-01-07 ENCOUNTER — Ambulatory Visit
Admission: RE | Admit: 2019-01-07 | Discharge: 2019-01-07 | Disposition: A | Discharge status: Home / Self Care | Payer: BC Managed Care – PPO | Source: Ambulatory Visit | Attending: Radiation Oncology | Admitting: Radiation Oncology

## 2019-01-07 DIAGNOSIS — Z510 Encounter for antineoplastic radiation therapy: Secondary | ICD-10-CM | POA: Diagnosis not present

## 2019-01-07 DIAGNOSIS — C50412 Malignant neoplasm of upper-outer quadrant of left female breast: Secondary | ICD-10-CM | POA: Diagnosis not present

## 2019-01-07 DIAGNOSIS — Z170 Estrogen receptor positive status [ER+]: Secondary | ICD-10-CM | POA: Diagnosis not present

## 2019-01-08 ENCOUNTER — Ambulatory Visit
Admission: RE | Admit: 2019-01-08 | Discharge: 2019-01-08 | Disposition: A | Discharge status: Home / Self Care | Payer: BC Managed Care – PPO | Source: Ambulatory Visit | Attending: Radiation Oncology | Admitting: Radiation Oncology

## 2019-01-08 ENCOUNTER — Other Ambulatory Visit: Payer: Self-pay

## 2019-01-08 DIAGNOSIS — Z170 Estrogen receptor positive status [ER+]: Secondary | ICD-10-CM | POA: Diagnosis not present

## 2019-01-08 DIAGNOSIS — Z510 Encounter for antineoplastic radiation therapy: Secondary | ICD-10-CM | POA: Diagnosis not present

## 2019-01-08 DIAGNOSIS — C50412 Malignant neoplasm of upper-outer quadrant of left female breast: Secondary | ICD-10-CM | POA: Diagnosis not present

## 2019-01-09 ENCOUNTER — Other Ambulatory Visit: Payer: Self-pay

## 2019-01-09 ENCOUNTER — Ambulatory Visit
Admission: RE | Admit: 2019-01-09 | Discharge: 2019-01-09 | Disposition: A | Discharge status: Home / Self Care | Payer: BC Managed Care – PPO | Source: Ambulatory Visit | Attending: Radiation Oncology | Admitting: Radiation Oncology

## 2019-01-09 DIAGNOSIS — C50412 Malignant neoplasm of upper-outer quadrant of left female breast: Secondary | ICD-10-CM | POA: Diagnosis not present

## 2019-01-09 DIAGNOSIS — Z170 Estrogen receptor positive status [ER+]: Secondary | ICD-10-CM | POA: Diagnosis not present

## 2019-01-09 DIAGNOSIS — Z510 Encounter for antineoplastic radiation therapy: Secondary | ICD-10-CM | POA: Diagnosis not present

## 2019-01-10 ENCOUNTER — Other Ambulatory Visit: Payer: Self-pay

## 2019-01-10 ENCOUNTER — Ambulatory Visit
Admission: RE | Admit: 2019-01-10 | Discharge: 2019-01-10 | Disposition: A | Discharge status: Home / Self Care | Payer: BC Managed Care – PPO | Source: Ambulatory Visit | Attending: Radiation Oncology | Admitting: Radiation Oncology

## 2019-01-10 DIAGNOSIS — Z170 Estrogen receptor positive status [ER+]: Secondary | ICD-10-CM | POA: Diagnosis not present

## 2019-01-10 DIAGNOSIS — C50412 Malignant neoplasm of upper-outer quadrant of left female breast: Secondary | ICD-10-CM | POA: Diagnosis not present

## 2019-01-10 DIAGNOSIS — Z510 Encounter for antineoplastic radiation therapy: Secondary | ICD-10-CM | POA: Diagnosis not present

## 2019-01-13 ENCOUNTER — Ambulatory Visit
Admission: RE | Admit: 2019-01-13 | Discharge: 2019-01-13 | Disposition: A | Discharge status: Home / Self Care | Payer: BC Managed Care – PPO | Source: Ambulatory Visit | Attending: Radiation Oncology | Admitting: Radiation Oncology

## 2019-01-13 ENCOUNTER — Other Ambulatory Visit: Payer: Self-pay

## 2019-01-13 DIAGNOSIS — C50412 Malignant neoplasm of upper-outer quadrant of left female breast: Secondary | ICD-10-CM | POA: Diagnosis not present

## 2019-01-13 DIAGNOSIS — Z510 Encounter for antineoplastic radiation therapy: Secondary | ICD-10-CM | POA: Diagnosis not present

## 2019-01-13 DIAGNOSIS — Z170 Estrogen receptor positive status [ER+]: Secondary | ICD-10-CM | POA: Diagnosis not present

## 2019-01-14 ENCOUNTER — Other Ambulatory Visit: Payer: Self-pay

## 2019-01-14 ENCOUNTER — Ambulatory Visit
Admission: RE | Admit: 2019-01-14 | Discharge: 2019-01-14 | Disposition: A | Discharge status: Home / Self Care | Payer: BC Managed Care – PPO | Source: Ambulatory Visit | Attending: Radiation Oncology | Admitting: Radiation Oncology

## 2019-01-14 DIAGNOSIS — Z170 Estrogen receptor positive status [ER+]: Secondary | ICD-10-CM | POA: Diagnosis not present

## 2019-01-14 DIAGNOSIS — Z510 Encounter for antineoplastic radiation therapy: Secondary | ICD-10-CM | POA: Diagnosis not present

## 2019-01-14 DIAGNOSIS — C50412 Malignant neoplasm of upper-outer quadrant of left female breast: Secondary | ICD-10-CM | POA: Diagnosis not present

## 2019-01-15 ENCOUNTER — Ambulatory Visit
Admission: RE | Admit: 2019-01-15 | Discharge: 2019-01-15 | Disposition: A | Discharge status: Home / Self Care | Payer: BC Managed Care – PPO | Source: Ambulatory Visit | Attending: Radiation Oncology | Admitting: Radiation Oncology

## 2019-01-15 ENCOUNTER — Other Ambulatory Visit: Payer: Self-pay

## 2019-01-15 DIAGNOSIS — C50412 Malignant neoplasm of upper-outer quadrant of left female breast: Secondary | ICD-10-CM | POA: Diagnosis not present

## 2019-01-15 DIAGNOSIS — Z170 Estrogen receptor positive status [ER+]: Secondary | ICD-10-CM | POA: Diagnosis not present

## 2019-01-15 DIAGNOSIS — Z510 Encounter for antineoplastic radiation therapy: Secondary | ICD-10-CM | POA: Diagnosis not present

## 2019-01-16 ENCOUNTER — Other Ambulatory Visit: Payer: Self-pay

## 2019-01-16 ENCOUNTER — Ambulatory Visit
Admission: RE | Admit: 2019-01-16 | Discharge: 2019-01-16 | Disposition: A | Discharge status: Home / Self Care | Payer: BC Managed Care – PPO | Source: Ambulatory Visit | Attending: Radiation Oncology | Admitting: Radiation Oncology

## 2019-01-16 DIAGNOSIS — Z510 Encounter for antineoplastic radiation therapy: Secondary | ICD-10-CM | POA: Diagnosis not present

## 2019-01-16 DIAGNOSIS — C50412 Malignant neoplasm of upper-outer quadrant of left female breast: Secondary | ICD-10-CM | POA: Diagnosis not present

## 2019-01-16 DIAGNOSIS — Z170 Estrogen receptor positive status [ER+]: Secondary | ICD-10-CM | POA: Diagnosis not present

## 2019-01-17 ENCOUNTER — Other Ambulatory Visit: Payer: Self-pay

## 2019-01-17 ENCOUNTER — Ambulatory Visit
Admission: RE | Admit: 2019-01-17 | Discharge: 2019-01-17 | Disposition: A | Discharge status: Home / Self Care | Payer: BC Managed Care – PPO | Source: Ambulatory Visit | Attending: Radiation Oncology | Admitting: Radiation Oncology

## 2019-01-17 DIAGNOSIS — Z510 Encounter for antineoplastic radiation therapy: Secondary | ICD-10-CM | POA: Diagnosis not present

## 2019-01-17 DIAGNOSIS — C50412 Malignant neoplasm of upper-outer quadrant of left female breast: Secondary | ICD-10-CM | POA: Diagnosis not present

## 2019-01-17 DIAGNOSIS — Z170 Estrogen receptor positive status [ER+]: Secondary | ICD-10-CM | POA: Diagnosis not present

## 2019-01-20 ENCOUNTER — Ambulatory Visit
Admission: RE | Admit: 2019-01-20 | Discharge: 2019-01-20 | Disposition: A | Discharge status: Home / Self Care | Payer: BC Managed Care – PPO | Source: Ambulatory Visit | Attending: Radiation Oncology | Admitting: Radiation Oncology

## 2019-01-20 ENCOUNTER — Other Ambulatory Visit: Payer: Self-pay

## 2019-01-20 DIAGNOSIS — Z510 Encounter for antineoplastic radiation therapy: Secondary | ICD-10-CM | POA: Diagnosis not present

## 2019-01-20 DIAGNOSIS — Z170 Estrogen receptor positive status [ER+]: Secondary | ICD-10-CM | POA: Diagnosis not present

## 2019-01-20 DIAGNOSIS — C50412 Malignant neoplasm of upper-outer quadrant of left female breast: Secondary | ICD-10-CM | POA: Diagnosis not present

## 2019-01-21 ENCOUNTER — Ambulatory Visit
Admission: RE | Admit: 2019-01-21 | Discharge: 2019-01-21 | Disposition: A | Discharge status: Home / Self Care | Payer: BC Managed Care – PPO | Source: Ambulatory Visit | Attending: Radiation Oncology | Admitting: Radiation Oncology

## 2019-01-21 ENCOUNTER — Other Ambulatory Visit: Payer: Self-pay

## 2019-01-21 DIAGNOSIS — Z170 Estrogen receptor positive status [ER+]: Secondary | ICD-10-CM | POA: Diagnosis not present

## 2019-01-21 DIAGNOSIS — Z510 Encounter for antineoplastic radiation therapy: Secondary | ICD-10-CM | POA: Diagnosis not present

## 2019-01-21 DIAGNOSIS — C50412 Malignant neoplasm of upper-outer quadrant of left female breast: Secondary | ICD-10-CM | POA: Diagnosis not present

## 2019-01-22 ENCOUNTER — Ambulatory Visit
Admission: RE | Admit: 2019-01-22 | Discharge: 2019-01-22 | Disposition: A | Discharge status: Home / Self Care | Payer: BC Managed Care – PPO | Source: Ambulatory Visit | Attending: Radiation Oncology | Admitting: Radiation Oncology

## 2019-01-22 ENCOUNTER — Other Ambulatory Visit: Payer: Self-pay

## 2019-01-22 DIAGNOSIS — Z510 Encounter for antineoplastic radiation therapy: Secondary | ICD-10-CM | POA: Diagnosis not present

## 2019-01-22 DIAGNOSIS — Z170 Estrogen receptor positive status [ER+]: Secondary | ICD-10-CM | POA: Diagnosis not present

## 2019-01-22 DIAGNOSIS — C50412 Malignant neoplasm of upper-outer quadrant of left female breast: Secondary | ICD-10-CM | POA: Diagnosis not present

## 2019-01-23 ENCOUNTER — Ambulatory Visit
Admission: RE | Admit: 2019-01-23 | Discharge: 2019-01-23 | Disposition: A | Discharge status: Home / Self Care | Payer: BC Managed Care – PPO | Source: Ambulatory Visit | Attending: Radiation Oncology | Admitting: Radiation Oncology

## 2019-01-23 ENCOUNTER — Other Ambulatory Visit: Payer: Self-pay

## 2019-01-23 DIAGNOSIS — Z510 Encounter for antineoplastic radiation therapy: Secondary | ICD-10-CM | POA: Diagnosis not present

## 2019-01-23 DIAGNOSIS — Z170 Estrogen receptor positive status [ER+]: Secondary | ICD-10-CM | POA: Diagnosis not present

## 2019-01-23 DIAGNOSIS — C50412 Malignant neoplasm of upper-outer quadrant of left female breast: Secondary | ICD-10-CM | POA: Diagnosis not present

## 2019-01-24 ENCOUNTER — Other Ambulatory Visit: Payer: Self-pay | Admitting: *Deleted

## 2019-01-24 ENCOUNTER — Other Ambulatory Visit: Payer: Self-pay

## 2019-01-24 ENCOUNTER — Ambulatory Visit
Admission: RE | Admit: 2019-01-24 | Discharge: 2019-01-24 | Disposition: A | Discharge status: Home / Self Care | Payer: BC Managed Care – PPO | Source: Ambulatory Visit | Attending: Radiation Oncology | Admitting: Radiation Oncology

## 2019-01-24 DIAGNOSIS — C50412 Malignant neoplasm of upper-outer quadrant of left female breast: Secondary | ICD-10-CM

## 2019-01-24 DIAGNOSIS — Z510 Encounter for antineoplastic radiation therapy: Secondary | ICD-10-CM | POA: Diagnosis not present

## 2019-01-24 DIAGNOSIS — Z170 Estrogen receptor positive status [ER+]: Secondary | ICD-10-CM

## 2019-01-24 MED ORDER — LORAZEPAM 0.5 MG PO TABS: ORAL_TABLET | ORAL | 0 refills | Status: DC

## 2019-01-24 MED ORDER — ALPRAZOLAM 0.5 MG PO TABS: 0.5000 mg | ORAL_TABLET | Freq: Every evening | ORAL | 3 refills | Status: DC | PRN

## 2019-01-26 ENCOUNTER — Ambulatory Visit
Admission: RE | Admit: 2019-01-26 | Discharge: 2019-01-26 | Disposition: A | Discharge status: Home / Self Care | Payer: BC Managed Care – PPO | Source: Ambulatory Visit | Attending: Radiation Oncology | Admitting: Radiation Oncology

## 2019-01-26 ENCOUNTER — Other Ambulatory Visit: Payer: Self-pay

## 2019-01-26 DIAGNOSIS — Z170 Estrogen receptor positive status [ER+]: Secondary | ICD-10-CM | POA: Diagnosis not present

## 2019-01-26 DIAGNOSIS — Z510 Encounter for antineoplastic radiation therapy: Secondary | ICD-10-CM | POA: Diagnosis not present

## 2019-01-26 DIAGNOSIS — C50412 Malignant neoplasm of upper-outer quadrant of left female breast: Secondary | ICD-10-CM | POA: Diagnosis not present

## 2019-01-27 ENCOUNTER — Other Ambulatory Visit: Payer: Self-pay

## 2019-01-27 ENCOUNTER — Ambulatory Visit
Admission: RE | Admit: 2019-01-27 | Discharge: 2019-01-27 | Disposition: A | Discharge status: Home / Self Care | Payer: BC Managed Care – PPO | Source: Ambulatory Visit | Attending: Radiation Oncology | Admitting: Radiation Oncology

## 2019-01-27 DIAGNOSIS — Z170 Estrogen receptor positive status [ER+]: Secondary | ICD-10-CM | POA: Diagnosis not present

## 2019-01-27 DIAGNOSIS — C50412 Malignant neoplasm of upper-outer quadrant of left female breast: Secondary | ICD-10-CM | POA: Diagnosis not present

## 2019-01-27 DIAGNOSIS — Z510 Encounter for antineoplastic radiation therapy: Secondary | ICD-10-CM | POA: Diagnosis not present

## 2019-01-28 ENCOUNTER — Ambulatory Visit
Admission: RE | Admit: 2019-01-28 | Discharge: 2019-01-28 | Disposition: A | Discharge status: Home / Self Care | Payer: BC Managed Care – PPO | Source: Ambulatory Visit | Attending: Radiation Oncology | Admitting: Radiation Oncology

## 2019-01-28 ENCOUNTER — Other Ambulatory Visit: Payer: Self-pay

## 2019-01-28 DIAGNOSIS — C50412 Malignant neoplasm of upper-outer quadrant of left female breast: Secondary | ICD-10-CM

## 2019-01-28 DIAGNOSIS — Z170 Estrogen receptor positive status [ER+]: Secondary | ICD-10-CM | POA: Diagnosis not present

## 2019-01-28 DIAGNOSIS — Z510 Encounter for antineoplastic radiation therapy: Secondary | ICD-10-CM | POA: Diagnosis not present

## 2019-01-28 MED ORDER — SONAFINE EX EMUL
1.0000 "application " | Freq: Two times a day (BID) | CUTANEOUS | Status: DC
  Administered 2019-01-28: 1 via TOPICAL

## 2019-01-29 ENCOUNTER — Ambulatory Visit
Admission: RE | Admit: 2019-01-29 | Discharge: 2019-01-29 | Disposition: A | Discharge status: Home / Self Care | Payer: BC Managed Care – PPO | Source: Ambulatory Visit | Attending: Radiation Oncology | Admitting: Radiation Oncology

## 2019-01-29 ENCOUNTER — Other Ambulatory Visit: Payer: Self-pay

## 2019-01-29 DIAGNOSIS — Z510 Encounter for antineoplastic radiation therapy: Secondary | ICD-10-CM | POA: Diagnosis not present

## 2019-01-29 DIAGNOSIS — Z170 Estrogen receptor positive status [ER+]: Secondary | ICD-10-CM | POA: Diagnosis not present

## 2019-01-29 DIAGNOSIS — C50412 Malignant neoplasm of upper-outer quadrant of left female breast: Secondary | ICD-10-CM | POA: Diagnosis not present

## 2019-02-03 ENCOUNTER — Other Ambulatory Visit: Payer: Self-pay

## 2019-02-03 ENCOUNTER — Ambulatory Visit
Admission: RE | Admit: 2019-02-03 | Discharge: 2019-02-03 | Disposition: A | Discharge status: Home / Self Care | Payer: BC Managed Care – PPO | Source: Ambulatory Visit | Attending: Radiation Oncology | Admitting: Radiation Oncology

## 2019-02-03 DIAGNOSIS — Z510 Encounter for antineoplastic radiation therapy: Secondary | ICD-10-CM | POA: Diagnosis not present

## 2019-02-03 DIAGNOSIS — C50412 Malignant neoplasm of upper-outer quadrant of left female breast: Secondary | ICD-10-CM | POA: Diagnosis not present

## 2019-02-03 DIAGNOSIS — Z170 Estrogen receptor positive status [ER+]: Secondary | ICD-10-CM | POA: Diagnosis not present

## 2019-02-04 ENCOUNTER — Inpatient Hospital Stay: Payer: BC Managed Care – PPO | Attending: Hematology and Oncology

## 2019-02-04 ENCOUNTER — Ambulatory Visit
Admission: RE | Admit: 2019-02-04 | Discharge: 2019-02-04 | Disposition: A | Discharge status: Home / Self Care | Payer: BC Managed Care – PPO | Source: Ambulatory Visit | Attending: Radiation Oncology | Admitting: Radiation Oncology

## 2019-02-04 ENCOUNTER — Other Ambulatory Visit: Payer: Self-pay

## 2019-02-04 VITALS — BP 149/103 | HR 95 | Temp 98.2°F | Resp 20

## 2019-02-04 DIAGNOSIS — Z170 Estrogen receptor positive status [ER+]: Secondary | ICD-10-CM | POA: Insufficient documentation

## 2019-02-04 DIAGNOSIS — Z5111 Encounter for antineoplastic chemotherapy: Secondary | ICD-10-CM | POA: Diagnosis not present

## 2019-02-04 DIAGNOSIS — C50412 Malignant neoplasm of upper-outer quadrant of left female breast: Secondary | ICD-10-CM | POA: Insufficient documentation

## 2019-02-04 DIAGNOSIS — Z923 Personal history of irradiation: Secondary | ICD-10-CM | POA: Diagnosis not present

## 2019-02-04 DIAGNOSIS — Z9079 Acquired absence of other genital organ(s): Secondary | ICD-10-CM | POA: Insufficient documentation

## 2019-02-04 DIAGNOSIS — Z79899 Other long term (current) drug therapy: Secondary | ICD-10-CM | POA: Insufficient documentation

## 2019-02-04 DIAGNOSIS — Z90722 Acquired absence of ovaries, bilateral: Secondary | ICD-10-CM | POA: Diagnosis not present

## 2019-02-04 DIAGNOSIS — C787 Secondary malignant neoplasm of liver and intrahepatic bile duct: Secondary | ICD-10-CM | POA: Insufficient documentation

## 2019-02-04 DIAGNOSIS — Z95828 Presence of other vascular implants and grafts: Secondary | ICD-10-CM

## 2019-02-04 DIAGNOSIS — Z510 Encounter for antineoplastic radiation therapy: Secondary | ICD-10-CM | POA: Diagnosis not present

## 2019-02-04 MED ORDER — GOSERELIN ACETATE 3.6 MG ~~LOC~~ IMPL
DRUG_IMPLANT | SUBCUTANEOUS | Status: AC
  Filled 2019-02-04: qty 3.6

## 2019-02-04 MED ORDER — GOSERELIN ACETATE 3.6 MG ~~LOC~~ IMPL
3.6000 mg | DRUG_IMPLANT | Freq: Once | SUBCUTANEOUS | Status: AC
  Administered 2019-02-04: 3.6 mg via SUBCUTANEOUS

## 2019-02-04 NOTE — Patient Instructions (Signed)

## 2019-02-05 ENCOUNTER — Other Ambulatory Visit: Payer: Self-pay

## 2019-02-05 ENCOUNTER — Ambulatory Visit
Admission: RE | Admit: 2019-02-05 | Discharge: 2019-02-05 | Disposition: A | Discharge status: Home / Self Care | Payer: BC Managed Care – PPO | Source: Ambulatory Visit | Attending: Radiation Oncology | Admitting: Radiation Oncology

## 2019-02-05 DIAGNOSIS — Z170 Estrogen receptor positive status [ER+]: Secondary | ICD-10-CM | POA: Diagnosis not present

## 2019-02-05 DIAGNOSIS — Z510 Encounter for antineoplastic radiation therapy: Secondary | ICD-10-CM | POA: Diagnosis not present

## 2019-02-05 DIAGNOSIS — C50412 Malignant neoplasm of upper-outer quadrant of left female breast: Secondary | ICD-10-CM | POA: Diagnosis not present

## 2019-02-06 ENCOUNTER — Ambulatory Visit
Admission: RE | Admit: 2019-02-06 | Discharge: 2019-02-06 | Disposition: A | Discharge status: Home / Self Care | Payer: BC Managed Care – PPO | Source: Ambulatory Visit | Attending: Radiation Oncology | Admitting: Radiation Oncology

## 2019-02-06 ENCOUNTER — Other Ambulatory Visit: Payer: Self-pay

## 2019-02-06 DIAGNOSIS — Z510 Encounter for antineoplastic radiation therapy: Secondary | ICD-10-CM | POA: Diagnosis not present

## 2019-02-06 DIAGNOSIS — C50412 Malignant neoplasm of upper-outer quadrant of left female breast: Secondary | ICD-10-CM | POA: Diagnosis not present

## 2019-02-06 DIAGNOSIS — Z170 Estrogen receptor positive status [ER+]: Secondary | ICD-10-CM | POA: Diagnosis not present

## 2019-02-07 ENCOUNTER — Other Ambulatory Visit: Payer: Self-pay

## 2019-02-07 ENCOUNTER — Ambulatory Visit
Admission: RE | Admit: 2019-02-07 | Discharge: 2019-02-07 | Disposition: A | Discharge status: Home / Self Care | Payer: BC Managed Care – PPO | Source: Ambulatory Visit | Attending: Radiation Oncology | Admitting: Radiation Oncology

## 2019-02-07 ENCOUNTER — Ambulatory Visit: Payer: BC Managed Care – PPO

## 2019-02-07 DIAGNOSIS — Z170 Estrogen receptor positive status [ER+]: Secondary | ICD-10-CM | POA: Diagnosis not present

## 2019-02-07 DIAGNOSIS — Z510 Encounter for antineoplastic radiation therapy: Secondary | ICD-10-CM | POA: Diagnosis not present

## 2019-02-07 DIAGNOSIS — C50412 Malignant neoplasm of upper-outer quadrant of left female breast: Secondary | ICD-10-CM | POA: Diagnosis not present

## 2019-02-09 NOTE — Progress Notes (Signed)
Patient Care Team: [name], MD as PCP - General (Family Medicine) Mauro Kaufmann, RN as Oncology Nurse Navigator Rockwell Germany, RN as Oncology Nurse Navigator  DIAGNOSIS:    ICD-10-CM   1. Malignant neoplasm of upper-outer quadrant of left breast in female, estrogen receptor positive ([place])  C50.412    Z17.0     SUMMARY OF ONCOLOGIC HISTORY: Oncology History  Malignant neoplasm of upper-outer quadrant of left breast in female, estrogen receptor positive ([place])  05/13/2018 Initial Diagnosis   Evaluation of 5 months of thickening of the left breast upper outer quadrant.  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  Several lymph nodes were identified by ultrasound.  T2 N1 stage IIb clinical stage   05/16/2018 Cancer Staging   Staging form: Breast, AJCC 8th Edition - Clinical stage from 05/16/2018: Stage IIB (cT2, cN1, cM0, G3, ER+, PR+, HER2-) - Signed by [name], MD on 05/16/2018   05/28/2018 -  Neo-Adjuvant Chemotherapy   Neoadjuvant chemotherapy with dose dense Adriamycin and Cytoxan followed by Taxol weekly x12   08/16/2018 Genetic Testing   Negative genetic testing on the multi-cancer panel.  The Multi-Gene Panel offered by [facility] includes sequencing and/or deletion duplication testing of the following 85 genes: AIP, ALK, APC, ATM, AXIN2,BAP1,  BARD1, BLM, BMPR1A, BRCA1, BRCA2, BRIP1, CASR, CDC73, CDH1, CDK4, CDKN1B, CDKN1C, CDKN2A (p14ARF), CDKN2A (p16INK4a), CEBPA, CHEK2, CTNNA1, DICER1, DIS3L2, EGFR (c.2369C>T, p.Thr790Met variant only), EPCAM (Deletion/duplication testing only), FH, FLCN, GATA2, GPC3, GREM1 (Promoter region deletion/duplication testing only), HOXB13 (c.251G>A, p.Gly84Glu), HRAS, KIT, MAX, MEN1, MET, MITF  (c.952G>A, p.Glu318Lys variant only), MLH1, MSH2, MSH3, MSH6, MUTYH, NBN, NF1, NF2, NTHL1, PALB2, PDGFRA, PHOX2B, PMS2, POLD1, POLE, POT1, PRKAR1A, PTCH1, PTEN, RAD50, RAD51C, RAD51D, RB1, RECQL4, RET, RNF43, RUNX1, SDHAF2, SDHA (sequence changes only), SDHB, SDHC, SDHD, SMAD4, SMARCA4, SMARCB1, SMARCE1, STK11, SUFU, TERC, TERT, TMEM127, TP53, TSC1, TSC2, VHL, WRN and WT1.  The report date is August 16, 2018.   10/07/2018 Breast MRI   Significant improvement of left breast malignancy.  Non-mass enhancement 3.7 cm.  Significant improvement in the left axillary lymph nodes previously there were 6 nodes and currently there was a single lymph node with mild cortical thickening.   11/05/2018 Surgery   Left lumpectomy [name]: residual invasive carcinoma s/p neoadjuvant treatment, HER-2 - (0), ER+ 100%, PR+ 60%, Ki67 10%, involved inferior and medial margins, and 8/9 left axillary lymph nodes positive for carcinoma.   11/05/2018 Cancer Staging   Staging form: Breast, AJCC 8th Edition - Pathologic stage from 11/05/2018: No Stage Recommended (ypT2, pN2a, cM0, ER+, PR+, HER2-) - Signed by [name], NP on 11/27/2018   11/18/2018 Surgery   Re-excision of left lumpectomy [name]: residual invasive carcinoma, clear margins.   12/30/2018 -  Radiation Therapy   Adjuvant radiation     CHIEF COMPLIANT: Follow-up to discuss anti-estrogen therapy  INTERVAL HISTORY: Bethany Hanna is a 38 y.o. with above-mentioned history of left breast cancer treated with neoadjuvant chemotherapy, lumpectomy followed by re-excision, and who is currently undergoing radiation. She presents to the clinic today to discuss antiestrogen therapy.   REVIEW OF SYSTEMS:   Constitutional: Denies fevers, chills or abnormal weight loss Eyes: Denies blurriness of vision Ears, nose, mouth, throat, and face: Denies mucositis or sore throat Respiratory:  Denies cough, dyspnea or wheezes Cardiovascular: Denies palpitation,  chest discomfort Gastrointestinal: Denies nausea, heartburn or change in bowel habits Skin: Denies abnormal skin rashes Lymphatics: Denies new lymphadenopathy or easy bruising Neurological: Denies numbness, tingling or new weaknesses Behavioral/Psych: Mood is stable, no new changes  Extremities: No lower extremity edema Breast: denies any pain or lumps or nodules in either breasts All other systems were reviewed with the patient and are negative.  I have reviewed the past medical history, past surgical history, social history and family history with the patient and they are unchanged from previous note.  ALLERGIES:  has No Known Allergies.  MEDICATIONS:  Current Outpatient Medications  Medication Sig Dispense Refill  . acetaminophen (TYLENOL) 500 MG tablet Take 1,000 mg by mouth every 6 (six) hours as needed.    . ALPRAZolam (XANAX) 0.5 MG tablet Take 1 tablet (0.5 mg total) by mouth at bedtime as needed for anxiety. 60 tablet 3  . gabapentin (NEURONTIN) 100 MG capsule Take 1 capsule (100 mg total) by mouth at bedtime. 30 capsule 1  . Goserelin Acetate (ZOLADEX [place]) Inject into the skin every 28 (twenty-eight) days.    Marland Kitchen ibuprofen (ADVIL) 200 MG tablet Take 600 mg by mouth every 6 (six) hours as needed.    Marland Kitchen LORazepam (ATIVAN) 0.5 MG tablet TAKE 1 TABLET(0.5 MG) BY MOUTH AT BEDTIME AS NEEDED FOR NAUSEA OR VOMITING 30 tablet 0  . oxyCODONE (OXY IR/ROXICODONE) 5 MG immediate release tablet Take 1 tablet (5 mg total) by mouth every 6 (six) hours as needed for moderate pain, severe pain or breakthrough pain. 10 tablet 0  . traMADol (ULTRAM) 50 MG tablet Take by mouth every 6 (six) hours as needed.     No current facility-administered medications for this visit.     PHYSICAL EXAMINATION: ECOG PERFORMANCE STATUS: 1 - Symptomatic but completely ambulatory  There were no vitals filed for this visit. There were no vitals filed for this visit.  GENERAL: alert, no distress and comfortable  SKIN: skin color, texture, turgor are normal, no rashes or significant lesions EYES: normal, Conjunctiva are pink and non-injected, sclera clear OROPHARYNX: no exudate, no erythema and lips, buccal mucosa, and tongue normal  NECK: supple, thyroid normal size, non-tender, without nodularity LYMPH: no palpable lymphadenopathy in the cervical, axillary or inguinal LUNGS: clear to auscultation and percussion with normal breathing effort HEART: regular rate & rhythm and no murmurs and no lower extremity edema ABDOMEN: abdomen soft, non-tender and normal bowel sounds MUSCULOSKELETAL: no cyanosis of digits and no clubbing  NEURO: alert & oriented x 3 with fluent speech, no focal motor/sensory deficits EXTREMITIES: No lower extremity edema  LABORATORY DATA:  I have reviewed the data as listed CMP Latest Ref Rng & Units 11/18/2018 10/31/2018 10/08/2018  Glucose 70 - 99 mg/dL 105(H) 105(H) 98  BUN 6 - 20 mg/dL 5(L) 9 8  Creatinine 0.44 - 1.00 mg/dL 0.71 0.65 0.74  Sodium 135 - 145 mmol/L 137 137 139  Potassium 3.5 - 5.1 mmol/L 3.7 3.9 3.9  Chloride 98 - 111 mmol/L 104 103 104  CO2 22 - 32 mmol/L _0 Calcium 8.9 - 10.3 mg/dL 9.9 9.6 9.5  Total Protein 6.5 - 8.1 g/dL - - 7.3  Total Bilirubin 0.3 - 1.2 mg/dL - - 0.3  Alkaline Phos 38 - 126 U/L - - 72  AST 15 - 41 U/L - - 16  ALT 0 - 44 U/L - - 26    Lab Results  Component Value Date   WBC 7.7 11/18/2018   HGB 12.9 11/18/2018   HCT 40.0 11/18/2018   MCV 91.5 11/18/2018   PLT 479 (H) 11/18/2018   NEUTROABS 6.9 10/08/2018    ASSESSMENT & PLAN:  Malignant neoplasm of upper-outer quadrant of left breast in female, estrogen receptor positive ([place]) 05/13/2018:Evaluation of 5 months of thickening of the left breast upper outer quadrant. Initial mammogram revealed 2 cm irregular mass, ultrasound revealed 4 cm irregular mass with spiculated margin 2 o'clock position middle depth biopsy revealed invasive mammary carcinoma grade 3 that was ER PR  positive HER-2 negative, left axillary lymph node biopsy positive for breast cancer. Several lymph nodes were identified by ultrasound. T2 N1 stage IIb clinical stage  Treatment plan: 1.Neoadjuvant chemotherapy with dose dense Adriamycin and Cytoxan followed by Taxol weekly x12 completed 10/08/2018 2.Breast conserving surgery with targeted node dissection 11/05/2018 3.Adjuvant radiation therapy 12/30/2018 4.Followed by adjuvant antiestrogen therapy(complete estrogen blockade with Zoladex plus aromatase inhibitor versus oophorectomy) ---------------------------------------------------------------------- 11/05/2018:Left lumpectomy [name]: residual invasive carcinoma s/p neoadjuvant treatment, HER-2 - (0), ER+ 100%, PR+ 60%, Ki67 10%, involved inferior and medial margins, and 8/9 left axillary lymph nodes positive for carcinoma. Resection of margins 11/18/2018: Residual cancer was identified in the left inferior margin and the final margins are clear.  Recommendation: 1.  Adjuvant radiation therapy 12/30/2018-02/10/2019 2.  Complete estrogen blockade with Zoladex with anastrozole.  Patient is contemplating on getting oophorectomy at some point.  Complete estrogen blockade counseling: I discussed with the patient the role of Zoladex with anastrozole.  This is superior to tamoxifen therapy based upon soft and text trials  Anastrozole counseling:We discussed the risks and benefits of anti-estrogen therapy with aromatase inhibitors. These include but not limited to insomnia, hot flashes, mood changes, vaginal dryness, bone density loss, and weight gain. We strongly believe that the benefits far outweigh the risks. Patient understands these risks and consented to starting treatment. Planned treatment duration is 10 years. I sent a new prescription today.  I discussed with the patient oophorectomy is the same as Zoladex injections.  Patient is keen on undergoing oophorectomy.  I sent a message  to Dr. Ronita Hipps to discuss that with her. Return to clinic monthly for Zoladex injections and in 3 months for survivorship care plan visit   No orders of the defined types were placed in this encounter.  The patient has a good understanding of the overall plan. she agrees with it. she will call with any problems that may develop before the next visit here.  [name], MD 02/10/2019  Julious Oka Dorshimer, am acting as scribe for Dr. [name].  I have reviewed the above documentation for accuracy and completeness, and I agree with the above.

## 2019-02-10 ENCOUNTER — Other Ambulatory Visit: Payer: Self-pay

## 2019-02-10 ENCOUNTER — Ambulatory Visit
Admission: RE | Admit: 2019-02-10 | Discharge: 2019-02-10 | Disposition: A | Discharge status: Home / Self Care | Payer: BC Managed Care – PPO | Source: Ambulatory Visit | Attending: Radiation Oncology | Admitting: Radiation Oncology

## 2019-02-10 ENCOUNTER — Inpatient Hospital Stay (HOSPITAL_BASED_OUTPATIENT_CLINIC_OR_DEPARTMENT_OTHER): Payer: BC Managed Care – PPO | Admitting: Hematology and Oncology

## 2019-02-10 DIAGNOSIS — Z923 Personal history of irradiation: Secondary | ICD-10-CM | POA: Diagnosis not present

## 2019-02-10 DIAGNOSIS — Z79899 Other long term (current) drug therapy: Secondary | ICD-10-CM | POA: Diagnosis not present

## 2019-02-10 DIAGNOSIS — Z9079 Acquired absence of other genital organ(s): Secondary | ICD-10-CM | POA: Diagnosis not present

## 2019-02-10 DIAGNOSIS — Z5111 Encounter for antineoplastic chemotherapy: Secondary | ICD-10-CM | POA: Diagnosis not present

## 2019-02-10 DIAGNOSIS — Z170 Estrogen receptor positive status [ER+]: Secondary | ICD-10-CM | POA: Diagnosis not present

## 2019-02-10 DIAGNOSIS — Z90722 Acquired absence of ovaries, bilateral: Secondary | ICD-10-CM | POA: Diagnosis not present

## 2019-02-10 DIAGNOSIS — C787 Secondary malignant neoplasm of liver and intrahepatic bile duct: Secondary | ICD-10-CM | POA: Diagnosis not present

## 2019-02-10 DIAGNOSIS — Z510 Encounter for antineoplastic radiation therapy: Secondary | ICD-10-CM | POA: Diagnosis not present

## 2019-02-10 DIAGNOSIS — C50412 Malignant neoplasm of upper-outer quadrant of left female breast: Secondary | ICD-10-CM

## 2019-02-10 MED ORDER — ANASTROZOLE 1 MG PO TABS: 1.0000 mg | ORAL_TABLET | Freq: Every day | ORAL | 3 refills | Status: DC

## 2019-02-10 NOTE — Assessment & Plan Note (Addendum)
05/13/2018:Evaluation of 5 months of thickening of the left breast upper outer quadrant. 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 Several lymph nodes were identified by ultrasound. T2 N1 stage IIb clinical stage  Treatment plan: 1.Neoadjuvant chemotherapy with dose dense Adriamycin and Cytoxan followed by Taxol weekly x12 completed 10/08/2018 2.Breast conserving surgery with targeted node dissection 11/05/2018 3.Adjuvant radiation therapy 12/30/2018 4.Followed by adjuvant antiestrogen therapy(complete estrogen blockade with Zoladex plus aromatase inhibitor versus oophorectomy) ---------------------------------------------------------------------- 11/05/2018:Left lumpectomy [name]: residual invasive carcinoma s/p neoadjuvant treatment, HER-2 - (0), ER+ 100%, PR+ 60%, Ki67 10%, involved inferior and medial margins, and 8/9 left axillary lymph nodes positive for carcinoma. Resection of margins 11/18/2018: Residual cancer was identified in the left inferior margin and the final margins are clear.  Recommendation: 1.  Adjuvant radiation therapy 12/30/2018-02/10/2019 2.  Complete estrogen blockade with Zoladex with anastrozole.  Patient is contemplating on getting oophorectomy at some point.  Complete estrogen blockade counseling: I discussed with the patient the role of Zoladex with anastrozole.  This is superior to tamoxifen therapy based upon soft and text trials  Anastrozole counseling:We discussed the risks and benefits of anti-estrogen therapy with aromatase inhibitors. These include but not limited to insomnia, hot flashes, mood changes, vaginal dryness, bone density loss, and weight gain. We strongly believe that the benefits far outweigh  the risks. Patient understands these risks and consented to starting treatment. Planned treatment duration is 7-10 years.  Return to clinic monthly for Zoladex injections and in 3 months for survivorship care plan visit

## 2019-02-11 ENCOUNTER — Telehealth: Payer: Self-pay | Admitting: Hematology and Oncology

## 2019-02-11 ENCOUNTER — Other Ambulatory Visit: Payer: Self-pay

## 2019-02-11 ENCOUNTER — Ambulatory Visit
Admission: RE | Admit: 2019-02-11 | Discharge: 2019-02-11 | Disposition: A | Discharge status: Home / Self Care | Payer: BC Managed Care – PPO | Source: Ambulatory Visit | Attending: Radiation Oncology | Admitting: Radiation Oncology

## 2019-02-11 DIAGNOSIS — Z510 Encounter for antineoplastic radiation therapy: Secondary | ICD-10-CM | POA: Diagnosis not present

## 2019-02-11 DIAGNOSIS — Z170 Estrogen receptor positive status [ER+]: Secondary | ICD-10-CM | POA: Diagnosis not present

## 2019-02-11 DIAGNOSIS — C50412 Malignant neoplasm of upper-outer quadrant of left female breast: Secondary | ICD-10-CM | POA: Diagnosis not present

## 2019-02-11 NOTE — Telephone Encounter (Signed)
I left a message regarding schedule  

## 2019-02-12 ENCOUNTER — Other Ambulatory Visit: Payer: Self-pay

## 2019-02-12 ENCOUNTER — Ambulatory Visit
Admission: RE | Admit: 2019-02-12 | Discharge: 2019-02-12 | Disposition: A | Discharge status: Home / Self Care | Payer: BC Managed Care – PPO | Source: Ambulatory Visit | Attending: Radiation Oncology | Admitting: Radiation Oncology

## 2019-02-12 DIAGNOSIS — Z510 Encounter for antineoplastic radiation therapy: Secondary | ICD-10-CM | POA: Diagnosis not present

## 2019-02-12 DIAGNOSIS — Z170 Estrogen receptor positive status [ER+]: Secondary | ICD-10-CM | POA: Diagnosis not present

## 2019-02-12 DIAGNOSIS — C50412 Malignant neoplasm of upper-outer quadrant of left female breast: Secondary | ICD-10-CM | POA: Diagnosis not present

## 2019-02-13 ENCOUNTER — Other Ambulatory Visit: Payer: Self-pay | Admitting: Obstetrics and Gynecology

## 2019-02-13 ENCOUNTER — Encounter (HOSPITAL_BASED_OUTPATIENT_CLINIC_OR_DEPARTMENT_OTHER): Payer: Self-pay | Admitting: Obstetrics and Gynecology

## 2019-02-13 ENCOUNTER — Ambulatory Visit
Admission: RE | Admit: 2019-02-13 | Discharge: 2019-02-13 | Disposition: A | Discharge status: Home / Self Care | Payer: BC Managed Care – PPO | Source: Ambulatory Visit | Attending: Radiation Oncology | Admitting: Radiation Oncology

## 2019-02-13 ENCOUNTER — Other Ambulatory Visit: Payer: Self-pay

## 2019-02-13 DIAGNOSIS — Z510 Encounter for antineoplastic radiation therapy: Secondary | ICD-10-CM | POA: Diagnosis not present

## 2019-02-13 DIAGNOSIS — C50412 Malignant neoplasm of upper-outer quadrant of left female breast: Secondary | ICD-10-CM | POA: Diagnosis not present

## 2019-02-13 DIAGNOSIS — Z170 Estrogen receptor positive status [ER+]: Secondary | ICD-10-CM | POA: Diagnosis not present

## 2019-02-13 NOTE — Progress Notes (Signed)
Spoke w/ via phone for pre-op interview--- PT Lab needs dos-- CBC, BMP, T&S, Urine preg   Lab results------  EKG dated 10-31-2018 epic COVID test ------  02-14-2019 @ 1105 Arrive at ------- 1100 NPO after ------  MN w/ exception clear liquids until 0700 then nothing by mouth (no cream/ mild products) Medications to take morning of surgery ----- may that xanax if needed am dos w/ sips of water Diabetic medication ----- n/a Patient Special Instructions ----- n/a Pre-Op special Istructions ----- case just posted today , orders need second sign Patient verbalized understanding of instructions that were given at this phone interview. Patient denies shortness of breath, chest pain, fever, cough a this phone interview.

## 2019-02-14 ENCOUNTER — Encounter: Payer: Self-pay | Admitting: *Deleted

## 2019-02-14 ENCOUNTER — Ambulatory Visit
Admission: RE | Admit: 2019-02-14 | Discharge: 2019-02-14 | Disposition: A | Discharge status: Home / Self Care | Payer: BC Managed Care – PPO | Source: Ambulatory Visit | Attending: Radiation Oncology | Admitting: Radiation Oncology

## 2019-02-14 ENCOUNTER — Other Ambulatory Visit (HOSPITAL_COMMUNITY)
Admission: RE | Admit: 2019-02-14 | Discharge: 2019-02-14 | Disposition: A | Discharge status: Home / Self Care | Payer: BC Managed Care – PPO | Source: Ambulatory Visit | Attending: Obstetrics and Gynecology | Admitting: Obstetrics and Gynecology

## 2019-02-14 ENCOUNTER — Other Ambulatory Visit: Payer: Self-pay

## 2019-02-14 ENCOUNTER — Ambulatory Visit: Payer: BC Managed Care – PPO

## 2019-02-14 DIAGNOSIS — Z20828 Contact with and (suspected) exposure to other viral communicable diseases: Secondary | ICD-10-CM | POA: Diagnosis not present

## 2019-02-14 DIAGNOSIS — C50412 Malignant neoplasm of upper-outer quadrant of left female breast: Secondary | ICD-10-CM | POA: Diagnosis not present

## 2019-02-14 DIAGNOSIS — Z510 Encounter for antineoplastic radiation therapy: Secondary | ICD-10-CM | POA: Diagnosis not present

## 2019-02-14 DIAGNOSIS — Z170 Estrogen receptor positive status [ER+]: Secondary | ICD-10-CM | POA: Diagnosis not present

## 2019-02-14 DIAGNOSIS — Z01812 Encounter for preprocedural laboratory examination: Secondary | ICD-10-CM | POA: Insufficient documentation

## 2019-02-15 LAB — NOVEL CORONAVIRUS, NAA (HOSP ORDER, SEND-OUT TO REF LAB; TAT 18-24 HRS): SARS-CoV-2, NAA: NOT DETECTED

## 2019-02-17 ENCOUNTER — Ambulatory Visit: Payer: BC Managed Care – PPO

## 2019-02-17 DIAGNOSIS — C50919 Malignant neoplasm of unspecified site of unspecified female breast: Secondary | ICD-10-CM | POA: Diagnosis not present

## 2019-02-18 ENCOUNTER — Encounter (HOSPITAL_BASED_OUTPATIENT_CLINIC_OR_DEPARTMENT_OTHER): Payer: Self-pay | Admitting: Obstetrics and Gynecology

## 2019-02-18 ENCOUNTER — Telehealth: Payer: Self-pay | Admitting: *Deleted

## 2019-02-18 ENCOUNTER — Ambulatory Visit (HOSPITAL_BASED_OUTPATIENT_CLINIC_OR_DEPARTMENT_OTHER): Payer: BC Managed Care – PPO | Admitting: Anesthesiology

## 2019-02-18 ENCOUNTER — Other Ambulatory Visit: Payer: Self-pay

## 2019-02-18 ENCOUNTER — Encounter (HOSPITAL_BASED_OUTPATIENT_CLINIC_OR_DEPARTMENT_OTHER)
Admission: RE | Disposition: A | Discharge status: Home / Self Care | Payer: Self-pay | Source: Home / Self Care | Attending: Obstetrics and Gynecology

## 2019-02-18 ENCOUNTER — Encounter: Payer: Self-pay | Admitting: Hematology and Oncology

## 2019-02-18 ENCOUNTER — Ambulatory Visit (HOSPITAL_BASED_OUTPATIENT_CLINIC_OR_DEPARTMENT_OTHER)
Admission: RE | Admit: 2019-02-18 | Discharge: 2019-02-18 | Disposition: A | Discharge status: Home / Self Care | Payer: BC Managed Care – PPO | Attending: Obstetrics and Gynecology | Admitting: Obstetrics and Gynecology

## 2019-02-18 DIAGNOSIS — Z4002 Encounter for prophylactic removal of ovary: Secondary | ICD-10-CM | POA: Diagnosis not present

## 2019-02-18 DIAGNOSIS — I10 Essential (primary) hypertension: Secondary | ICD-10-CM | POA: Diagnosis not present

## 2019-02-18 DIAGNOSIS — Z8249 Family history of ischemic heart disease and other diseases of the circulatory system: Secondary | ICD-10-CM | POA: Insufficient documentation

## 2019-02-18 DIAGNOSIS — K219 Gastro-esophageal reflux disease without esophagitis: Secondary | ICD-10-CM | POA: Diagnosis not present

## 2019-02-18 DIAGNOSIS — Z820 Family history of epilepsy and other diseases of the nervous system: Secondary | ICD-10-CM | POA: Insufficient documentation

## 2019-02-18 DIAGNOSIS — C50412 Malignant neoplasm of upper-outer quadrant of left female breast: Secondary | ICD-10-CM | POA: Diagnosis not present

## 2019-02-18 DIAGNOSIS — Z808 Family history of malignant neoplasm of other organs or systems: Secondary | ICD-10-CM | POA: Diagnosis not present

## 2019-02-18 DIAGNOSIS — Z800 Family history of malignant neoplasm of digestive organs: Secondary | ICD-10-CM | POA: Insufficient documentation

## 2019-02-18 DIAGNOSIS — Z170 Estrogen receptor positive status [ER+]: Secondary | ICD-10-CM | POA: Diagnosis not present

## 2019-02-18 DIAGNOSIS — Z807 Family history of other malignant neoplasms of lymphoid, hematopoietic and related tissues: Secondary | ICD-10-CM | POA: Diagnosis not present

## 2019-02-18 DIAGNOSIS — C561 Malignant neoplasm of right ovary: Secondary | ICD-10-CM | POA: Diagnosis not present

## 2019-02-18 DIAGNOSIS — G43909 Migraine, unspecified, not intractable, without status migrainosus: Secondary | ICD-10-CM | POA: Diagnosis not present

## 2019-02-18 DIAGNOSIS — Z9049 Acquired absence of other specified parts of digestive tract: Secondary | ICD-10-CM | POA: Diagnosis not present

## 2019-02-18 DIAGNOSIS — Z818 Family history of other mental and behavioral disorders: Secondary | ICD-10-CM | POA: Insufficient documentation

## 2019-02-18 DIAGNOSIS — C562 Malignant neoplasm of left ovary: Secondary | ICD-10-CM | POA: Diagnosis not present

## 2019-02-18 DIAGNOSIS — C50912 Malignant neoplasm of unspecified site of left female breast: Secondary | ICD-10-CM | POA: Insufficient documentation

## 2019-02-18 DIAGNOSIS — Z923 Personal history of irradiation: Secondary | ICD-10-CM | POA: Diagnosis not present

## 2019-02-18 DIAGNOSIS — K7689 Other specified diseases of liver: Secondary | ICD-10-CM | POA: Diagnosis not present

## 2019-02-18 DIAGNOSIS — Z811 Family history of alcohol abuse and dependence: Secondary | ICD-10-CM | POA: Insufficient documentation

## 2019-02-18 DIAGNOSIS — Z823 Family history of stroke: Secondary | ICD-10-CM | POA: Diagnosis not present

## 2019-02-18 DIAGNOSIS — Z8261 Family history of arthritis: Secondary | ICD-10-CM | POA: Insufficient documentation

## 2019-02-18 DIAGNOSIS — N736 Female pelvic peritoneal adhesions (postinfective): Secondary | ICD-10-CM | POA: Diagnosis not present

## 2019-02-18 DIAGNOSIS — F419 Anxiety disorder, unspecified: Secondary | ICD-10-CM | POA: Diagnosis not present

## 2019-02-18 DIAGNOSIS — R188 Other ascites: Secondary | ICD-10-CM | POA: Insufficient documentation

## 2019-02-18 HISTORY — DX: Burn of unspecified body region, unspecified degree: T30.0

## 2019-02-18 HISTORY — DX: Gastro-esophageal reflux disease without esophagitis: K21.9

## 2019-02-18 HISTORY — PX: ROBOTIC ASSISTED SALPINGO OOPHERECTOMY: SHX6082

## 2019-02-18 HISTORY — DX: Personal history of antineoplastic chemotherapy: Z92.21

## 2019-02-18 HISTORY — DX: Personal history of other diseases of the nervous system and sense organs: Z86.69

## 2019-02-18 HISTORY — DX: Estrogen receptor positive status (ER+): C50.412

## 2019-02-18 HISTORY — DX: Estrogen receptor positive status (ER+): Z17.0

## 2019-02-18 HISTORY — DX: Elevated blood-pressure reading, without diagnosis of hypertension: R03.0

## 2019-02-18 LAB — BASIC METABOLIC PANEL
Anion gap: 12 (ref 5–15)
BUN: 9 mg/dL (ref 6–20)
CO2: 23 mmol/L (ref 22–32)
Calcium: 9.8 mg/dL (ref 8.9–10.3)
Chloride: 105 mmol/L (ref 98–111)
Creatinine, Ser: 0.62 mg/dL (ref 0.44–1.00)
GFR calc Af Amer: >60 mL/min (ref >60)
GFR calc non Af Amer: >60 mL/min (ref >60)
Glucose, Bld: 94 mg/dL (ref 70–99)
Potassium: 3.5 mmol/L (ref 3.5–5.1)
Sodium: 140 mmol/L (ref 135–145)

## 2019-02-18 LAB — CBC
HCT: 41.2 % (ref 36.0–46.0)
Hemoglobin: 13.8 g/dL (ref 12.0–15.0)
MCH: 29.9 pg (ref 26.0–34.0)
MCHC: 33.5 g/dL (ref 30.0–36.0)
MCV: 89.4 fL (ref 80.0–100.0)
Platelets: 343 10*3/uL (ref 150–400)
RBC: 4.61 MIL/uL (ref 3.87–5.11)
RDW: 13 % (ref 11.5–15.5)
WBC: 7.3 10*3/uL (ref 4.0–10.5)
nRBC: 0 % (ref 0.0–0.2)

## 2019-02-18 LAB — TYPE AND SCREEN
ABO/RH(D): A POS
Antibody Screen: NEGATIVE

## 2019-02-18 LAB — POCT PREGNANCY, URINE: Preg Test, Ur: NEGATIVE

## 2019-02-18 LAB — ABO/RH: ABO/RH(D): A POS

## 2019-02-18 SURGERY — SALPINGO-OOPHORECTOMY, ROBOT-ASSISTED
Anesthesia: General | Laterality: Bilateral

## 2019-02-18 MED ORDER — FENTANYL CITRATE (PF) 100 MCG/2ML IJ SOLN
INTRAMUSCULAR | Status: DC | PRN
  Administered 2019-02-18: 50 ug via INTRAVENOUS
  Administered 2019-02-18 (×3): 100 ug via INTRAVENOUS

## 2019-02-18 MED ORDER — FENTANYL CITRATE (PF) 100 MCG/2ML IJ SOLN
INTRAMUSCULAR | Status: AC
  Filled 2019-02-18: qty 2

## 2019-02-18 MED ORDER — MIDAZOLAM HCL 2 MG/2ML IJ SOLN
INTRAMUSCULAR | Status: AC
  Filled 2019-02-18: qty 2

## 2019-02-18 MED ORDER — KETOROLAC TROMETHAMINE 30 MG/ML IJ SOLN
INTRAMUSCULAR | Status: AC
  Filled 2019-02-18: qty 1

## 2019-02-18 MED ORDER — LACTATED RINGERS IV SOLN
INTRAVENOUS | Status: DC
  Filled 2019-02-18 (×2): qty 1000

## 2019-02-18 MED ORDER — HYDROMORPHONE HCL 1 MG/ML IJ SOLN
0.2500 mg | INTRAMUSCULAR | Status: DC | PRN
  Administered 2019-02-18 (×4): 0.5 mg via INTRAVENOUS
  Filled 2019-02-18: qty 0.5

## 2019-02-18 MED ORDER — HYDROMORPHONE HCL 1 MG/ML IJ SOLN
INTRAMUSCULAR | Status: AC
  Filled 2019-02-18: qty 1

## 2019-02-18 MED ORDER — PROMETHAZINE HCL 25 MG/ML IJ SOLN
6.2500 mg | INTRAMUSCULAR | Status: DC | PRN
  Filled 2019-02-18: qty 1

## 2019-02-18 MED ORDER — ACETAMINOPHEN 500 MG PO TABS
ORAL_TABLET | ORAL | Status: AC
  Filled 2019-02-18: qty 2

## 2019-02-18 MED ORDER — LIDOCAINE 2% (20 MG/ML) 5 ML SYRINGE
INTRAMUSCULAR | Status: AC
  Filled 2019-02-18: qty 5

## 2019-02-18 MED ORDER — LIDOCAINE 2% (20 MG/ML) 5 ML SYRINGE
INTRAMUSCULAR | Status: DC | PRN
  Administered 2019-02-18: 60 mg via INTRAVENOUS

## 2019-02-18 MED ORDER — KETAMINE HCL 10 MG/ML IJ SOLN
INTRAMUSCULAR | Status: AC
  Filled 2019-02-18: qty 1

## 2019-02-18 MED ORDER — ONDANSETRON HCL 4 MG/2ML IJ SOLN
INTRAMUSCULAR | Status: DC | PRN
  Administered 2019-02-18: 4 mg via INTRAVENOUS

## 2019-02-18 MED ORDER — OXYCODONE HCL 5 MG PO TABS
5.0000 mg | ORAL_TABLET | Freq: Once | ORAL | Status: AC | PRN
  Administered 2019-02-18: 5 mg via ORAL
  Filled 2019-02-18: qty 1

## 2019-02-18 MED ORDER — KETOROLAC TROMETHAMINE 30 MG/ML IJ SOLN
30.0000 mg | Freq: Once | INTRAMUSCULAR | Status: DC | PRN
  Filled 2019-02-18: qty 1

## 2019-02-18 MED ORDER — ROCURONIUM BROMIDE 10 MG/ML (PF) SYRINGE
PREFILLED_SYRINGE | INTRAVENOUS | Status: DC | PRN
  Administered 2019-02-18: 50 mg via INTRAVENOUS

## 2019-02-18 MED ORDER — MEPERIDINE HCL 25 MG/ML IJ SOLN
6.2500 mg | INTRAMUSCULAR | Status: DC | PRN
  Filled 2019-02-18: qty 1

## 2019-02-18 MED ORDER — CEFAZOLIN SODIUM-DEXTROSE 2-4 GM/100ML-% IV SOLN
INTRAVENOUS | Status: AC
  Filled 2019-02-18: qty 100

## 2019-02-18 MED ORDER — PROPOFOL 10 MG/ML IV BOLUS
INTRAVENOUS | Status: DC | PRN
  Administered 2019-02-18: 150 mg via INTRAVENOUS

## 2019-02-18 MED ORDER — DEXAMETHASONE SODIUM PHOSPHATE 10 MG/ML IJ SOLN
INTRAMUSCULAR | Status: AC
  Filled 2019-02-18: qty 1

## 2019-02-18 MED ORDER — CEFAZOLIN SODIUM-DEXTROSE 2-4 GM/100ML-% IV SOLN
2.0000 g | INTRAVENOUS | Status: AC
  Administered 2019-02-18: 12:00:00 2 g via INTRAVENOUS
  Filled 2019-02-18: qty 100

## 2019-02-18 MED ORDER — ROCURONIUM BROMIDE 10 MG/ML (PF) SYRINGE
PREFILLED_SYRINGE | INTRAVENOUS | Status: AC
  Filled 2019-02-18: qty 10

## 2019-02-18 MED ORDER — SCOPOLAMINE 1 MG/3DAYS TD PT72
MEDICATED_PATCH | TRANSDERMAL | Status: AC
  Filled 2019-02-18: qty 1

## 2019-02-18 MED ORDER — OXYCODONE HCL 5 MG PO TABS
ORAL_TABLET | ORAL | Status: AC
  Filled 2019-02-18: qty 1

## 2019-02-18 MED ORDER — OXYCODONE HCL 5 MG/5ML PO SOLN
5.0000 mg | Freq: Once | ORAL | Status: AC | PRN
  Filled 2019-02-18: qty 5

## 2019-02-18 MED ORDER — PROPOFOL 10 MG/ML IV BOLUS
INTRAVENOUS | Status: AC
  Filled 2019-02-18: qty 40

## 2019-02-18 MED ORDER — OXYCODONE-ACETAMINOPHEN 5-325 MG PO TABS: 1.0000 | ORAL_TABLET | ORAL | 0 refills | Status: DC | PRN

## 2019-02-18 MED ORDER — BUPIVACAINE HCL (PF) 0.25 % IJ SOLN
INTRAMUSCULAR | Status: DC | PRN
  Administered 2019-02-18: 30 mL

## 2019-02-18 MED ORDER — SODIUM CHLORIDE 0.9 % IR SOLN
Status: DC | PRN
  Administered 2019-02-18: 1000 mL

## 2019-02-18 MED ORDER — FENTANYL CITRATE (PF) 250 MCG/5ML IJ SOLN
INTRAMUSCULAR | Status: AC
  Filled 2019-02-18: qty 5

## 2019-02-18 MED ORDER — SODIUM CHLORIDE 0.9 % IV SOLN
INTRAVENOUS | Status: DC | PRN
  Administered 2019-02-18: 60 mL

## 2019-02-18 MED ORDER — SUGAMMADEX SODIUM 200 MG/2ML IV SOLN
INTRAVENOUS | Status: DC | PRN
  Administered 2019-02-18: 200 mg via INTRAVENOUS

## 2019-02-18 MED ORDER — DEXAMETHASONE SODIUM PHOSPHATE 10 MG/ML IJ SOLN
INTRAMUSCULAR | Status: DC | PRN
  Administered 2019-02-18: 10 mg via INTRAVENOUS

## 2019-02-18 MED ORDER — ACETAMINOPHEN 500 MG PO TABS
1000.0000 mg | ORAL_TABLET | Freq: Once | ORAL | Status: AC
  Administered 2019-02-18: 1000 mg via ORAL
  Filled 2019-02-18: qty 2

## 2019-02-18 MED ORDER — MIDAZOLAM HCL 2 MG/2ML IJ SOLN
INTRAMUSCULAR | Status: DC | PRN
  Administered 2019-02-18: 2 mg via INTRAVENOUS
  Administered 2019-02-18: 1 mg via INTRAVENOUS

## 2019-02-18 MED ORDER — KETAMINE HCL 10 MG/ML IJ SOLN
INTRAMUSCULAR | Status: DC | PRN
  Administered 2019-02-18: 20 mg via INTRAVENOUS
  Administered 2019-02-18: 30 mg via INTRAVENOUS

## 2019-02-18 MED ORDER — SCOPOLAMINE 1 MG/3DAYS TD PT72
1.0000 | MEDICATED_PATCH | TRANSDERMAL | Status: DC
  Administered 2019-02-18: 1.5 mg via TRANSDERMAL
  Filled 2019-02-18: qty 1

## 2019-02-18 MED ORDER — MIDAZOLAM HCL 2 MG/2ML IJ SOLN
2.0000 mg | Freq: Once | INTRAMUSCULAR | Status: AC
  Administered 2019-02-18: 2 mg via INTRAVENOUS
  Filled 2019-02-18: qty 2

## 2019-02-18 MED ORDER — ONDANSETRON HCL 4 MG/2ML IJ SOLN
INTRAMUSCULAR | Status: AC
  Filled 2019-02-18: qty 2

## 2019-02-18 SURGICAL SUPPLY — 61 items
ADH SKN CLS APL DERMABOND .7 (GAUZE/BANDAGES/DRESSINGS) ×1
BAG SPEC RTRVL LRG 6X4 10 (ENDOMECHANICALS) ×1
BARRIER ADHS 3X4 INTERCEED (GAUZE/BANDAGES/DRESSINGS) IMPLANT
BRR ADH 4X3 ABS CNTRL BYND (GAUZE/BANDAGES/DRESSINGS)
CANISTER SUCT 3000ML PPV (MISCELLANEOUS) ×2 IMPLANT
CATH FOLEY 3WAY  5CC 16FR (CATHETERS) ×1
CATH FOLEY 3WAY 5CC 16FR (CATHETERS) ×1 IMPLANT
COVER BACK TABLE 60X90IN (DRAPES) ×2 IMPLANT
COVER TIP SHEARS 8 DVNC (MISCELLANEOUS) ×1 IMPLANT
COVER TIP SHEARS 8MM DA VINCI (MISCELLANEOUS) ×1
COVER WAND RF STERILE (DRAPES) ×2 IMPLANT
DECANTER SPIKE VIAL GLASS SM (MISCELLANEOUS) ×4 IMPLANT
DEFOGGER SCOPE WARMER CLEARIFY (MISCELLANEOUS) ×2 IMPLANT
DERMABOND ADVANCED (GAUZE/BANDAGES/DRESSINGS) ×1
DERMABOND ADVANCED .7 DNX12 (GAUZE/BANDAGES/DRESSINGS) ×1 IMPLANT
DRAPE ARM DVNC X/XI (DISPOSABLE) ×4 IMPLANT
DRAPE COLUMN DVNC XI (DISPOSABLE) ×1 IMPLANT
DRAPE DA VINCI XI ARM (DISPOSABLE) ×4
DRAPE DA VINCI XI COLUMN (DISPOSABLE) ×1
DURAPREP 26ML APPLICATOR (WOUND CARE) ×2 IMPLANT
ELECT REM PT RETURN 9FT ADLT (ELECTROSURGICAL) ×2
ELECTRODE REM PT RTRN 9FT ADLT (ELECTROSURGICAL) ×1 IMPLANT
GAUZE 4X4 16PLY RFD (DISPOSABLE) ×2 IMPLANT
GLOVE BIO SURGEON STRL SZ7 (GLOVE) IMPLANT
GLOVE BIO SURGEON STRL SZ7.5 (GLOVE) ×6 IMPLANT
GLOVE BIOGEL PI IND STRL 7.0 (GLOVE) IMPLANT
GLOVE BIOGEL PI INDICATOR 7.0 (GLOVE)
IRRIG SUCT STRYKERFLOW 2 WTIP (MISCELLANEOUS) ×2
IRRIGATION SUCT STRKRFLW 2 WTP (MISCELLANEOUS) ×1 IMPLANT
NEEDLE INSUFFLATION 150MM (ENDOMECHANICALS) ×2 IMPLANT
OBTURATOR OPTICAL STANDARD 8MM (TROCAR) ×1
OBTURATOR OPTICAL STND 8 DVNC (TROCAR) ×1
OBTURATOR OPTICALSTD 8 DVNC (TROCAR) ×1 IMPLANT
OCCLUDER COLPOPNEUMO (BALLOONS) ×2 IMPLANT
PACK ROBOT WH (CUSTOM PROCEDURE TRAY) ×2 IMPLANT
PACK ROBOTIC GOWN (GOWN DISPOSABLE) ×2 IMPLANT
PACK TRENDGUARD 450 HYBRID PRO (MISCELLANEOUS) IMPLANT
PAD PREP 24X48 CUFFED NSTRL (MISCELLANEOUS) ×2 IMPLANT
POUCH SPECIMEN RETRIEVAL 10MM (ENDOMECHANICALS) ×1 IMPLANT
PROTECTOR NERVE ULNAR (MISCELLANEOUS) ×4 IMPLANT
SEAL CANN UNIV 5-8 DVNC XI (MISCELLANEOUS) ×4 IMPLANT
SEAL XI 5MM-8MM UNIVERSAL (MISCELLANEOUS) ×4
SET IRRIG Y TYPE TUR BLADDER L (SET/KITS/TRAYS/PACK) IMPLANT
SET TRI-LUMEN FLTR TB AIRSEAL (TUBING) ×2 IMPLANT
SPONGE LAP 4X18 RFD (DISPOSABLE) IMPLANT
SUT VIC AB 0 CT1 27 (SUTURE) ×2
SUT VIC AB 0 CT1 27XBRD ANBCTR (SUTURE) ×1 IMPLANT
SUT VICRYL 0 UR6 27IN ABS (SUTURE) ×2 IMPLANT
SUT VICRYL RAPIDE 4/0 PS 2 (SUTURE) ×4 IMPLANT
SUT VLOC 180 0 9IN  GS21 (SUTURE) ×1
SUT VLOC 180 0 9IN GS21 (SUTURE) ×1 IMPLANT
TIP RUMI ORANGE 6.7MMX12CM (TIP) IMPLANT
TIP UTERINE 5.1X6CM LAV DISP (MISCELLANEOUS) IMPLANT
TIP UTERINE 6.7X10CM GRN DISP (MISCELLANEOUS) IMPLANT
TIP UTERINE 6.7X6CM WHT DISP (MISCELLANEOUS) IMPLANT
TIP UTERINE 6.7X8CM BLUE DISP (MISCELLANEOUS) IMPLANT
TOWEL OR 17X26 10 PK STRL BLUE (TOWEL DISPOSABLE) ×2 IMPLANT
TRENDGUARD 450 HYBRID PRO PACK (MISCELLANEOUS) ×2
TROCAR PORT AIRSEAL 8X120 (TROCAR) ×2 IMPLANT
TROCAR XCEL NON-BLD 11X100MML (ENDOMECHANICALS) ×1 IMPLANT
WATER STERILE IRR 1000ML POUR (IV SOLUTION) ×2 IMPLANT

## 2019-02-18 NOTE — Progress Notes (Signed)
Patient seen and examined. Consent witnessed and signed. No changes noted. Update completed. BP (!) 147/112   Pulse (!) 118   Temp 97.8 F (36.6 C) (Oral)   Resp 16   Ht 5\' 8"  (1.727 m)   Wt 94.7 kg   LMP 05/21/2018   SpO2 100%   BMI 31.75 kg/m   CBC    Component Value Date/Time   WBC 7.3 02/18/2019 1112   RBC 4.61 02/18/2019 1112   HGB 13.8 02/18/2019 1112   HGB 11.7 (L) 10/08/2018 1317   HCT 41.2 02/18/2019 1112   PLT 343 02/18/2019 1112   PLT 401 (H) 10/08/2018 1317   MCV 89.4 02/18/2019 1112   MCV 91.6 01/19/2012 1716   MCH 29.9 02/18/2019 1112   MCHC 33.5 02/18/2019 1112   RDW 13.0 02/18/2019 1112   LYMPHSABS 1.2 10/08/2018 1317   MONOABS 0.5 10/08/2018 1317   EOSABS 0.1 10/08/2018 1317   BASOSABS 0.0 10/08/2018 1317

## 2019-02-18 NOTE — H&P (Signed)
Bethany Hanna is an 38 y.o. female. Bethany Hanna for prophylactic BSO.   Pertinent Gynecological History: Menses: flow is light Bleeding: na Contraception: none DES exposure: denies Blood transfusions: none Sexually transmitted diseases: no past history Previous GYN Procedures: DNC  Last mammogram: abnormal: Hanna Date: 2020 Last pap: normal Date: 2020 OB History: G3, P2   Menstrual History: Menarche age: 57 Patient's last menstrual period was 03/15/2018.    Past Medical History:  Diagnosis Date  . Anxiety    SITUational -uses xanax during the day, reports the ativan is for bedtime  . Elevated blood pressure reading in office with white coat syndrome, without diagnosis of hypertension   . Family history of colon cancer   . Family history of melanoma   . GERD (gastroesophageal reflux disease)   . History of cancer chemotherapy    left breast cancer--- 05-28-2018 to 10-08-2018  . History of migraine   . Malignant neoplasm of upper-outer quadrant of left breast in female, estrogen receptor positive [facility]) oncologist--- Bethany Hanna   dx 05-13-2018, clinical stage IIB, grade 3, invasive mammary carcinoma (ER/PR +, HER2 negative);  completed chemotherapy 10-08-2018;  11-05-2018 s/p left breast lumpectomy w/ node dissection's and Re-excision 11-18-2018;  started radiation therapy 12-30-2018 to complete 02-14-2019  . PONV (postoperative nausea and vomiting)   . Radiation burn    02-13-2019  per pt left breast , current radiation treatment    Past Surgical History:  Procedure Laterality Date  . BREAST LUMPECTOMY WITH RADIOACTIVE SEED AND AXILLARY LYMPH NODE DISSECTION Left 11/05/2018   Procedure: LEFT BREAST LUMPECTOMY WITH BRACKETED RADIOACTIVE SEED AND LEFT AXILLARY LYMPH NODE DISSECTION;  Surgeon: Bethany Bookbinder, MD;  Location: [facility];  Service: General;  Laterality: Left;  . CHOLECYSTECTOMY N/A 04/09/2015   Procedure: LAPAROSCOPIC CHOLECYSTECTOMY WITH INTRAOPERATIVE  CHOLANGIOGRAM;  Surgeon: Bethany Messing III, MD;  Location: [facility];  Service: General;  Laterality: N/A;  . DILATATION & CURRETTAGE/HYSTEROSCOPY WITH RESECTOCOPE N/A 07/31/2013   Procedure: [place];  Surgeon: Bethany Bruins, MD;  Location: [facility];  Service: Gynecology;  Laterality: N/A;  1 hr.  Marland Kitchen DILATION AND EVACUATION N/A 12/12/2017   Procedure: DILATATION AND EVACUATION (D&E) 2ND TRIMESTER;  Surgeon: Bethany Few, MD;  Location: [facility];  Service: Gynecology;  Laterality: N/A;  . OPERATIVE ULTRASOUND N/A 12/12/2017   Procedure: OPERATIVE ULTRASOUND;  Surgeon: Bethany Few, MD;  Location: [facility];  Service: Gynecology;  Laterality: N/A;  . PORT-A-CATH REMOVAL Right 11/05/2018   Procedure: REMOVAL PORT-A-CATH;  Surgeon: Bethany Bookbinder, MD;  Location: [place];  Service: General;  Laterality: Right;  . PORTACATH PLACEMENT Right 05/27/2018   Procedure: INSERTION PORT-A-CATH WITH ULTRASOUND;  Surgeon: Bethany Bookbinder, MD;  Location: [place];  Service: General;  Laterality: Right;  GENERAL AND LMA  . RE-EXCISION OF BREAST CANCER,SUPERIOR MARGINS Left 11/18/2018   Procedure: RE-EXCISION OF LEFT BREAST MARGIN;  Surgeon: Bethany Bookbinder, MD;  Location: [place];  Service: General;  Laterality: Left;    Family History  Problem Relation Age of Onset  . Arthritis Mother   . Migraines Mother   . ADD / ADHD Mother   . Varicose Veins Mother   . Hypertension Father   . Alcohol abuse Brother   . Varicose Veins Brother   . Stroke Maternal Grandmother   . Melanoma Maternal Grandmother   . Pulmonary fibrosis Maternal Grandfather   . Mitral valve prolapse Paternal Grandmother   . Migraines Paternal Grandmother   .  Lymphoma Paternal Grandfather        was treated for this and did ok  . Heart failure Paternal Grandfather   . Eczema Son   . Colon cancer Maternal Aunt 61    Social History:  reports that she has never smoked. She has never  used smokeless tobacco. She reports current alcohol use of about 2.0 - 3.0 standard drinks of alcohol per week. She reports that she does not use drugs.  Allergies: No Known Allergies  No medications prior to admission.    Review of Systems  Constitutional: Negative.   All other systems reviewed and are negative.   Height _0  (1.727 m), weight 90.7 kg, last menstrual period 03/15/2018. Physical Exam  Nursing note and vitals reviewed. Constitutional: She is oriented to person, place, and time. She appears well-developed and well-nourished.  HENT:  Head: Normocephalic and atraumatic.  Cardiovascular: Normal rate and regular rhythm.  Respiratory: Effort normal and breath sounds normal.  GI: Soft. Bowel sounds are normal.  Genitourinary:    Vagina and uterus normal.   Musculoskeletal:        General: Normal range of motion.     Cervical back: Normal range of motion and neck supple.  Neurological: She is alert and oriented to person, place, and time. She has normal reflexes.  Skin: Skin is warm and dry.  Psychiatric: She has a normal mood and affect.    No results found for this or any previous visit (from the past 24 hour(s)).  No results found.  Assessment/Plan: Left Breast Hanna- ERPR pos Prophylactic risk reducing  BSO Surgical consent done. Surgical risks vs benefits discussed. Consent signed.   Bethany Hanna J 02/18/2019, 6:36 AM

## 2019-02-18 NOTE — Op Note (Signed)
02/18/2019  1:53 PM  PATIENT:  Bethany Hanna  38 y.o. female  PRE-OPERATIVE DIAGNOSIS:  Breast Cancer, left  POST-OPERATIVE DIAGNOSIS:  Breast Cancer Superficial bilobar hepatic nodules  PROCEDURE:  Procedure(s): XI ROBOTIC ASSISTED SALPINGO OOPHORECTOMY LYSIS OF ADHESIONS PELVIC WASHINGS  SURGEON:  Surgeon(s): Brien Few, MD  ASSISTANTS: TRACY SUMNER RNFA   ANESTHESIA:   local and general  ESTIMATED BLOOD LOSS: MINIMAL   DRAINS: Urinary Catheter (Foley)   LOCAL MEDICATIONS USED:  MARCAINE    and Amount: 20 ml  SPECIMEN:  Source of Specimen:  BILATERAL TUBES AND OVARIES  DISPOSITION OF SPECIMEN:  PATHOLOGY  COUNTS:  YES  DICTATION #: DONE  PLAN OF CARE: DC HOME  PATIENT DISPOSITION:  PACU - hemodynamically stable.

## 2019-02-18 NOTE — Anesthesia Preprocedure Evaluation (Addendum)
Anesthesia Evaluation  Patient identified by MRN, date of birth, ID band Patient awake    Reviewed: Allergy & Precautions, NPO status , Patient's Chart, lab work & pertinent test results  History of Anesthesia Complications (+) PONV and history of anesthetic complications  Airway Mallampati: II  TM Distance: >3 FB Neck ROM: Full    Dental no notable dental hx.    Pulmonary neg pulmonary ROS,    Pulmonary exam normal breath sounds clear to auscultation       Cardiovascular hypertension, Normal cardiovascular exam Rhythm:Regular Rate:Normal  Occasional white coat HTN, no formal diagnosis    Neuro/Psych  Headaches, PSYCHIATRIC DISORDERS Anxiety    GI/Hepatic Neg liver ROS, GERD  ,  Endo/Other  negative endocrine ROS  Renal/GU negative Renal ROS  negative genitourinary   Musculoskeletal negative musculoskeletal ROS (+)   Abdominal Normal abdominal exam  (+)   Peds  Hematology negative hematology ROS (+)   Anesthesia Other Findings   Reproductive/Obstetrics Breast ca s/p chemo, radiation, L breast lumpectomy and LN dissection                           Anesthesia Physical Anesthesia Plan  ASA: II  Anesthesia Plan: General   Post-op Pain Management:    Induction: Intravenous  PONV Risk Score and Plan: 4 or greater and Ondansetron, Dexamethasone, Propofol infusion, Midazolam, Scopolamine patch - Pre-op, Diphenhydramine, Metaclopromide and Treatment may vary due to age or medical condition  Airway Management Planned: Oral ETT  Additional Equipment: None  Intra-op Plan:   Post-operative Plan: Extubation in OR  Informed Consent: I have reviewed the patients History and Physical, chart, labs and discussed the procedure including the risks, benefits and alternatives for the proposed anesthesia with the patient or authorized representative who has indicated his/her understanding and  acceptance.     Dental advisory given  Plan Discussed with: CRNA  Anesthesia Plan Comments: (Very anxious- requesting premedication with benzo in preop)       Anesthesia Quick Evaluation

## 2019-02-18 NOTE — Telephone Encounter (Signed)
This RN called pt per MD request to schedule either in person or phone visit- obtained identified VM- message left stating need to schedule appointment and will call again in AM.

## 2019-02-18 NOTE — Transfer of Care (Signed)
Immediate Anesthesia Transfer of Care Note  Patient: Bethany Hanna  Procedure(s) Performed: XI ROBOTIC ASSISTED SALPINGO OOPHORECTOMY (Bilateral )  Patient Location: PACU  Anesthesia Type:General  Level of Consciousness: awake, alert , oriented and patient cooperative  Airway & Oxygen Therapy: Patient Spontanous Breathing and Patient connected to nasal cannula oxygen  Post-op Assessment: Report given to RN and Post -op Vital signs reviewed and stable  Post vital signs: Reviewed and stable  Last Vitals:  Vitals Value Taken Time  BP    Temp    Pulse 102 02/18/19 1411  Resp    SpO2 99 % 02/18/19 1411  Vitals shown include unvalidated device data.  Last Pain:  Vitals:   02/18/19 1139  TempSrc: Oral  PainSc: 0-No pain      Patients Stated Pain Goal: 4 ([id number])  Complications: No apparent anesthesia complications

## 2019-02-18 NOTE — Discharge Instructions (Signed)
This sheet gives you information about how to care for yourself after your procedure. Your health care provider may also give you more specific instructions. If you have problems or questions, contact your health care provider. What can I expect after the procedure? After the procedure, it is common to have:  A sore throat.  Discomfort in your shoulder.  Mild discomfort or cramping in your abdomen.  Gas pains.  Pain or soreness in the area where the surgical incision was made.  A bloated feeling.  Tiredness.  Nausea.  Vomiting. Follow these instructions at home: Medicines  Take over-the-counter and prescription medicines only as told by your health care provider.  Do not take aspirin because it can cause bleeding.  Ask your health care provider if the medicine prescribed to you: ? Requires you to avoid driving or using heavy machinery. ? Can cause constipation. You may need to take actions to prevent or treat constipation, such as:  Drink enough fluid to keep your urine pale yellow.  Take over-the-counter or prescription medicines.  Eat foods that are high in fiber, such as beans, whole grains, and fresh fruits and vegetables.  Limit foods that are high in fat and processed sugars, such as fried or sweet foods. Incision care      Follow instructions from your health care provider about how to take care of your incision. Make sure you: ? Wash your hands with soap and water before and after you change your bandage (dressing). If soap and water are not available, use hand sanitizer. ? Change your dressing as told by your health care provider. ? Leave stitches (sutures), skin glue, or adhesive strips in place. These skin closures may need to stay in place for 2 weeks or longer. If adhesive strip edges start to loosen and curl up, you may trim the loose edges. Do not remove adhesive strips completely unless your health care provider tells you to do that.  Check your  incision area every day for signs of infection. Check for: ? Redness, swelling, or pain. ? Fluid or blood. ? Warmth. ? Pus or a bad smell. Activity  Rest as told by your health care provider.  Avoid sitting for a long time without moving. Get up to take short walks every 1-2 hours. This is important to improve blood flow and breathing. Ask for help if you feel weak or unsteady.  Return to your normal activities as told by your health care provider. Ask your health care provider what activities are safe for you. General instructions  Do not take baths, swim, or use a hot tub until your health care provider approves. Ask your health care provider if you may take showers. You may only be allowed to take sponge baths.  Have someone help you with your daily household tasks for the first few days.  Keep all follow-up visits as told by your health care provider. This is important. Contact a health care provider if:  You have redness, swelling, or pain around your incision.  Your incision feels warm to the touch.  You have pus or a bad smell coming from your incision.  The edges of your incision break open after the sutures have been removed.  Your pain does not improve after 2-3 days.  You have a rash.  You repeatedly become dizzy or light-headed.  Your pain medicine is not helping. Get help right away if you:  Have a fever.  Faint.  Have increasing pain in your abdomen.  Have severe pain in one or both of your shoulders.  Have fluid or blood coming from your sutures or from your vagina.  Have shortness of breath or difficulty breathing.  Have chest pain or leg pain.  Have ongoing nausea, vomiting, or diarrhea. Summary  After the procedure, it is common to have mild discomfort or cramping in your abdomen.  Take over-the-counter and prescription medicines only as told by your health care provider.  Watch for symptoms that should prompt you to call your health care  provider.  Keep all follow-up visits as told by your health care provider. This is important. This information is not intended to replace advice given to you by your health care provider. Make sure you discuss any questions you have with your health care provider. Document Released: 09/09/2004 Document Revised: 01/15/2018 Document Reviewed: 01/15/2018 Elsevier Patient Education  [place] Instructions  Activity: Get plenty of rest for the remainder of the day. A responsible individual must stay with you for 24 hours following the procedure.  For the next 24 hours, DO NOT: -Drive a car -Paediatric nurse -Drink alcoholic beverages -Take any medication unless instructed by your physician -Make any legal decisions or sign important papers.  Meals: Start with liquid foods such as gelatin or soup. Progress to regular foods as tolerated. Avoid greasy, spicy, heavy foods. If nausea and/or vomiting occur, drink only clear liquids until the nausea and/or vomiting subsides. Call your physician if vomiting continues.  Special Instructions/Symptoms: Your throat may feel dry or sore from the anesthesia or the breathing tube placed in your throat during surgery. If this causes discomfort, gargle with warm salt water. The discomfort should disappear within 24 hours.  If you had a scopolamine patch placed behind your ear for the management of post- operative nausea and/or vomiting:  1. The medication in the patch is effective for 72 hours, after which it should be removed.  Wrap patch in a tissue and discard in the trash. Wash hands thoroughly with soap and water. 2. You may remove the patch earlier than 72 hours if you experience unpleasant side effects which may include dry mouth, dizziness or visual disturbances. 3. Avoid touching the patch. Wash your hands with soap and water after contact with the patch.

## 2019-02-18 NOTE — Anesthesia Procedure Notes (Signed)
Procedure Name: Intubation Date/Time: 02/18/2019 12:34 PM Performed by: Wanita Chamberlain, CRNA Pre-anesthesia Checklist: Patient identified, Emergency Drugs available, Suction available and Patient being monitored Patient Re-evaluated:Patient Re-evaluated prior to induction Oxygen Delivery Method: Circle system utilized Preoxygenation: Pre-oxygenation with 100% oxygen Induction Type: IV induction Ventilation: Mask ventilation without difficulty Laryngoscope Size: Mac and 3 Grade View: Grade I Tube type: Oral Tube size: 7.0 mm Number of attempts: 1 Airway Equipment and Method: Stylet

## 2019-02-18 NOTE — Op Note (Signed)
NAMECONSTANCIA, Hanna MEDICAL RECORD [id number] ACCOUNT [id number] DATE OF BIRTH:July 03, 1980 FACILITY: [facility] LOCATION: [facility] PHYSICIAN:Shyanne Mcclary J. Mylena Sedberry, MD  OPERATIVE REPORT  DATE OF PROCEDURE:  02/18/2019  PREOPERATIVE DIAGNOSIS:  History of left estrogen and progesterone receptor-positive breast cancer for risk reducing prophylactic bilateral salpingo-oophorectomy.  POSTOPERATIVE DIAGNOSES: 1.  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   2.  Bilobar hepatic lesions.  PROCEDURE:  Da Vinci-assisted bilateral salpingo-oophorectomy, lysis of left periovarian adhesions, and pelvic washings.  SURGEON:  Brien Few, MD  ASSISTANT:  Sullivan Lone, RNFA  ESTIMATED BLOOD LOSS:  Minimal.  SPECIMEN:  Bilateral tubes and ovaries.  COUNTS:  Correct.  DISPOSITION:  The patient to recovery in good condition.  No complications.  BRIEF OPERATIVE NOTE:  After being apprised of the risks of anesthesia, infection, bleeding, injury to surrounding organs, possible need for repair, delayed versus immediate complications including bowel and bladder, internal vessel injury, possible need  for repair, patient was brought to the operating room where she was administered general anesthetic without complications.  Prepped and draped in usual sterile fashion.  Foley catheter placed.  ZUMI retractor placed vaginally.  After exam under  anesthesia confirms a normal size anteflexed uterus and no adnexal masses or nodularity appreciated at this time.  An infraumbilical incision made with a scalpel.  Veress needle placed, opening pressure of 3.  Four liters of CO2 insufflated without difficulty.  Trocar placed atraumatically.  Visualization reveals normal uterus, normal anterior and posterior cul-de-sac, bilateral normal appearing ovaries.  No evidence of excrescences.  Normal appearing  bilateral tubes.   Gallbladder is not visualized due to surgical absence.  Appendix was not visualized.  Peritoneal surfaces appeared smooth.  Omentum appears normal.  No evidence of any colonic adhesions are observed on exploration of anatomy.  However,  upon exploration of the upper abdomen, there are on both liver lobes, 4 or 5 discrete nodular umbilicated areas which are visualized.  At this time, a general surgeon was called in the room.  Dr. Johnathan Hausen consults visually on the case and notes an  agreement that these lesions do appear possibly suspicious for metastatic disease.  At this time, pelvic washings were performed in standard fashion and collected and sent to pathology. Deep trendelenberg positon established. Robot docked and ports x 3 placed in standard fashion. Scissors and bipolar forceps placed.  The left tube and ovary are observed.  Left tube and ovary were  traced out to the fimbriated end and the retroperitoneal space was entered.  The infundibular pelvic ligament on the left was skeletonized in standard fashion.  Approximately 2 cm at the level of the pelvic brim bilateral bipolar coagulation was done and  the left tube and ovary were divided at the level of the pelvic brim at the level of the uterus.  They were equally divided at the uterotubal junction and a separate specimen of the left tube was taken down to the level of the cornua.  On the right  side, the right tube and ovary were traced out to the pelvic brim.  The retroperitoneal space was entered.  The infundibular pelvic ligament was skeletonized on the right, cauterized approximately using bipolar cautery at the level of the pelvic brim  approximately 2-2.5 cm distal to the ovary.  No ovarian tissue was seen in the pedicle.  Then bipolar cautery was used to detach the specimen.  The specimen was further divided down to the level of  the uterotubal junction, which was also excised in  standard fashion using bipolar cautery.  The tube was then  also excised at the level of the cornua separately.  Good hemostasis was noted.  Irrigation was accomplished.  The robot is undocked.  Specimens were placed individually an EndoCatch and removed  through the umbilical port without difficulty.  Revisualization reveals no evidence of bleeding.  Irrigation was accomplished.  Ropivacaine solution was placed and the umbilical and robotic ports were removed under direct visualization.  CO2 was  released.  Positive pressure applied.  Hemostasis applied.  Local using 0.25% Marcaine was placed and all incisions were closed using 0 Vicryl and 4-0 Vicryl.  Dermabond placed.  The patient tolerated the procedure well, is awakened and transferred to  recovery room after removal of the Foley catheter and ZUMI retractor without complications.  CN/NUANCE  D:02/18/2019 T:02/18/2019 JOB:[id number]

## 2019-02-19 NOTE — Anesthesia Postprocedure Evaluation (Signed)
Anesthesia Post Note  Patient: Bethany Hanna  Procedure(s) Performed: XI ROBOTIC ASSISTED SALPINGO OOPHORECTOMY (Bilateral )     Patient location during evaluation: PACU Anesthesia Type: General Level of consciousness: awake and alert, oriented and patient cooperative Pain management: pain level controlled Vital Signs Assessment: post-procedure vital signs reviewed and stable Respiratory status: spontaneous breathing, nonlabored ventilation and respiratory function stable Cardiovascular status: blood pressure returned to baseline and stable Postop Assessment: no apparent nausea or vomiting Anesthetic complications: no    Last Vitals:  Vitals:   02/18/19 1500 02/18/19 1600  BP:  (!) 138/97  Pulse: 89 88  Resp: 12 13  Temp: 36.6 C (!) 36.3 C  SpO2: 95% 100%    Last Pain:  Vitals:   02/19/19 1443  TempSrc:   PainSc: [place]

## 2019-02-21 LAB — CYTOLOGY - NON PAP

## 2019-02-21 LAB — SURGICAL PATHOLOGY

## 2019-02-23 NOTE — Progress Notes (Signed)
Patient Care Team: [name], MD as PCP - General (Family Medicine) Mauro Kaufmann, RN as Oncology Nurse Navigator Rockwell Germany, RN as Oncology Nurse Navigator  DIAGNOSIS:    ICD-10-CM   1. Metastatic breast cancer ([place])  C50.919 NM PET Image Initial (PI) Skull Base To Thigh  2. Malignant neoplasm of upper-outer quadrant of left breast in female, estrogen receptor positive ([place])  C50.412    Z17.0     SUMMARY OF ONCOLOGIC HISTORY: Oncology History  Malignant neoplasm of upper-outer quadrant of left breast in female, estrogen receptor positive ([place])  05/13/2018 Initial Diagnosis   Evaluation of 5 months of thickening of the left breast upper outer quadrant.  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  Several lymph nodes were identified by ultrasound.  T2 N1 stage IIb clinical stage   05/16/2018 Cancer Staging   Staging form: Breast, AJCC 8th Edition - Clinical stage from 05/16/2018: Stage IIB (cT2, cN1, cM0, G3, ER+, PR+, HER2-) - Signed by [name], MD on 05/16/2018   05/28/2018 -  Neo-Adjuvant Chemotherapy   Neoadjuvant chemotherapy with dose dense Adriamycin and Cytoxan followed by Taxol weekly x12   08/16/2018 Genetic Testing   Negative genetic testing on the multi-cancer panel.  The Multi-Gene Panel offered by [facility] includes sequencing and/or deletion duplication testing of the following 85 genes: AIP, ALK, APC, ATM, AXIN2,BAP1,  BARD1, BLM, BMPR1A, BRCA1, BRCA2, BRIP1, CASR, CDC73, CDH1, CDK4, CDKN1B, CDKN1C, CDKN2A (p14ARF), CDKN2A (p16INK4a), CEBPA, CHEK2, CTNNA1, DICER1, DIS3L2, EGFR (c.2369C>T, p.Thr790Met variant only), EPCAM (Deletion/duplication testing only), FH, FLCN, GATA2, GPC3, GREM1 (Promoter region  deletion/duplication testing only), HOXB13 (c.251G>A, p.Gly84Glu), HRAS, KIT, MAX, MEN1, MET, MITF (c.952G>A, p.Glu318Lys variant only), MLH1, MSH2, MSH3, MSH6, MUTYH, NBN, NF1, NF2, NTHL1, PALB2, PDGFRA, PHOX2B, PMS2, POLD1, POLE, POT1, PRKAR1A, PTCH1, PTEN, RAD50, RAD51C, RAD51D, RB1, RECQL4, RET, RNF43, RUNX1, SDHAF2, SDHA (sequence changes only), SDHB, SDHC, SDHD, SMAD4, SMARCA4, SMARCB1, SMARCE1, STK11, SUFU, TERC, TERT, TMEM127, TP53, TSC1, TSC2, VHL, WRN and WT1.  The report date is August 16, 2018.   10/07/2018 Breast MRI   Significant improvement of left breast malignancy.  Non-mass enhancement 3.7 cm.  Significant improvement in the left axillary lymph nodes previously there were 6 nodes and currently there was a single lymph node with mild cortical thickening.   11/05/2018 Surgery   Left lumpectomy [name]: residual invasive carcinoma s/p neoadjuvant treatment, HER-2 - (0), ER+ 100%, PR+ 60%, Ki67 10%, involved inferior and medial margins, and 8/9 left axillary lymph nodes positive for carcinoma.   11/05/2018 Cancer Staging   Staging form: Breast, AJCC 8th Edition - Pathologic stage from 11/05/2018: No Stage Recommended (ypT2, pN2a, cM0, ER+, PR+, HER2-) - Signed by [name], NP on 11/27/2018   11/18/2018 Surgery   Re-excision of left lumpectomy [name]: residual invasive carcinoma, clear margins.   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 with above-mentioned history of left breast cancer treated with neoadjuvant chemotherapy,  lumpectomy followed by re-excision, and radiation. She is currently on  Zoladex with anastrozole. She underwent a bilateral salpingo-oophorectomy on 02/18/19 with Dr. 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 She presents to the clinic today to discuss the pathology report.   REVIEW OF SYSTEMS:   Constitutional: Denies fevers, chills or abnormal weight loss Eyes: Denies blurriness of vision Ears, nose, mouth, throat, and face: Denies mucositis or sore throat Respiratory: Denies cough, dyspnea or wheezes Cardiovascular: Denies palpitation, chest discomfort Gastrointestinal: Soreness in the abdomen from recent surgery Skin: Denies abnormal skin rashes Lymphatics: Denies new lymphadenopathy or easy bruising Neurological: Denies numbness, tingling or new weaknesses Behavioral/Psych: Mood is stable, no new changes  Extremities: No lower extremity edema Breast: denies any pain or lumps or nodules in either breasts All other systems were reviewed with the patient and are negative.  I have reviewed the past medical history, past surgical history, social history and family history with the patient and they are unchanged from previous note.  ALLERGIES:  has No Known Allergies.  MEDICATIONS:  Current Outpatient Medications  Medication Sig Dispense Refill  . acetaminophen (TYLENOL) 500 MG tablet Take 1,000 mg by mouth every 6 (six) hours as needed.    . ALPRAZolam (XANAX) 0.5 MG tablet Take 1 tablet (0.5 mg total) by mouth at bedtime as needed for anxiety. (Patient taking differently: Take 0.5 mg by mouth at bedtime as needed for anxiety. ) 60 tablet 3  . anastrozole (ARIMIDEX) 1 MG tablet Take 1 tablet (1 mg total) by mouth daily. (Patient not taking: Reported on 02/13/2019) 90 tablet 3  . calcium carbonate (TUMS - DOSED IN MG ELEMENTAL CALCIUM) 500 MG chewable tablet Chew 1 tablet by mouth as needed for indigestion or heartburn.     . gabapentin (NEURONTIN) 100 MG capsule Take 1 capsule (100 mg  total) by mouth at bedtime. (Patient not taking: Reported on 02/13/2019) 30 capsule 1  . Goserelin Acetate (ZOLADEX [place]) Inject into the skin every 28 (twenty-eight) days.    . ibuprofen (ADVIL) 200 MG tablet Take 600 mg by mouth every 6 (six) hours as needed.    . LORazepam (ATIVAN) 0.5 MG tablet TAKE 1 TABLET(0.5 MG) BY MOUTH AT BEDTIME AS NEEDED FOR NAUSEA OR VOMITING 30 tablet 0  . oxyCODONE-acetaminophen (PERCOCET/ROXICET) 5-325 MG tablet Take 1-2 tablets by mouth every 4 (four) hours as needed for severe pain. 30 tablet 0  . palbociclib (IBRANCE) 125 MG capsule Take 1 capsule (125 mg total) by mouth daily with breakfast. Take whole with food. Take for 21 days on, 7 days off, repeat every 28 days. 21 capsule 3   No current facility-administered medications for this visit.    PHYSICAL EXAMINATION: ECOG PERFORMANCE STATUS: 1 - Symptomatic but completely ambulatory  Vitals:   02/24/19 1030  BP: (!) 133/97  Pulse: (!) 107  Resp: 18  Temp: 97.8 F (36.6 C)  SpO2: 100%   Filed Weights   02/24/19 1030  Weight: 208 lb (94.3 kg)    GENERAL: alert, no distress and comfortable SKIN: skin color, texture, turgor are normal, no rashes or significant lesions EYES: normal, Conjunctiva are pink and non-injected, sclera clear OROPHARYNX: no exudate, no erythema and lips, buccal mucosa, and tongue normal  NECK: supple, thyroid normal size, non-tender, without nodularity LYMPH: no palpable lymphadenopathy in the cervical, axillary or inguinal LUNGS: clear to auscultation and percussion with normal breathing effort HEART: regular rate & rhythm and no   murmurs and no lower extremity edema ABDOMEN: abdomen soft, non-tender and normal bowel sounds MUSCULOSKELETAL: no cyanosis of digits and no clubbing  NEURO: alert & oriented x 3 with fluent speech, no focal motor/sensory deficits EXTREMITIES: No lower extremity edema  LABORATORY DATA:  I have reviewed the data as listed CMP Latest Ref Rng &  Units 02/18/2019 11/18/2018 10/31/2018  Glucose 70 - 99 mg/dL 94 105(H) 105(H)  BUN 6 - 20 mg/dL 9 5(L) 9  Creatinine 0.44 - 1.00 mg/dL 0.62 0.71 0.65  Sodium 135 - 145 mmol/L 140 137 137  Potassium 3.5 - 5.1 mmol/L 3.5 3.7 3.9  Chloride 98 - 111 mmol/L 105 104 103  CO2 22 - 32 mmol/L 23 22 23  Calcium 8.9 - 10.3 mg/dL 9.8 9.9 9.6  Total Protein 6.5 - 8.1 g/dL - - -  Total Bilirubin 0.3 - 1.2 mg/dL - - -  Alkaline Phos 38 - 126 U/L - - -  AST 15 - 41 U/L - - -  ALT 0 - 44 U/L - - -    Lab Results  Component Value Date   WBC 7.3 02/18/2019   HGB 13.8 02/18/2019   HCT 41.2 02/18/2019   MCV 89.4 02/18/2019   PLT 343 02/18/2019   NEUTROABS 6.9 10/08/2018    ASSESSMENT & PLAN:  Malignant neoplasm of upper-outer quadrant of left breast in female, estrogen receptor positive ([facility]) 05/13/2018:Evaluation of 5 months of thickening of the left breast upper outer quadrant. 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 Several lymph nodes were identified by ultrasound. T2 N1 stage IIb clinical stage  Treatment plan: 1.Neoadjuvant chemotherapy with dose dense Adriamycin and Cytoxan followed by Taxol weekly x12completed 10/08/2018 2.Breast conserving surgery with targeted node dissection9/03/2018 3.Adjuvant radiation therapy 12/30/2018-02/10/2019 4.Followed by adjuvant antiestrogen therapy (hysterectomy and bilateral salpingo-oophorectomy 02/18/2019) ----------------------------------------------------------------------------------------------------------------------------------------------------------------- 11/05/2018:Left lumpectomy ([facility]): residual invasive carcinoma s/p neoadjuvant treatment, HER-2 - (0), ER+ 100%, PR+ 60%, Ki67 10%,  involved inferior and medial margins, and 8/9 left axillary lymph nodes positive for carcinoma. Resection of margins 11/18/2018: Residual cancer was identified in the left inferior margin and the final margins are clear.  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  Pathology discussion: I discussed with the patient that the presence of cancer on the ovary would indicate metastatic disease.  We suppose that the nodules in the liver are also sites of metastatic disease.  Plan: 1.  PET CT scan 2.  Start therapy with Ibrance along with letrozole.  Ibrance: I discussed the risks and benefits of Ibrance including myelosuppression especially neutropenia and with that risk of infection, there is risk of pulmonary embolism and mild peripheral neuropathy as well. Fatigue, nausea, diarrhea, decreased appetite as well as alopecia and thrombocytopenia are also potential side effects of Ibrance  Return to clinic in 2 weeks for lab check and follow-up.    Orders Placed This Encounter  Procedures  . 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    Answer:   No    Order Specific Question:   Preferred imaging location?    Answer:   [facility]    Order Specific Question:   Radiology Contrast Protocol - do NOT remove file path    Answer:   [email or web address]   The patient has a good  understanding of the overall plan. she agrees with it. she will call with any problems that may develop before the next visit here.  , , MD 02/24/2019  I, Molly Dorshimer, am acting as scribe for Dr.  .  I have reviewed the above document for accuracy and completeness, and I agree with the above.       

## 2019-02-24 ENCOUNTER — Telehealth: Payer: Self-pay | Admitting: Pharmacist

## 2019-02-24 ENCOUNTER — Inpatient Hospital Stay: Payer: BC Managed Care – PPO | Admitting: Hematology and Oncology

## 2019-02-24 ENCOUNTER — Other Ambulatory Visit: Payer: Self-pay | Admitting: *Deleted

## 2019-02-24 ENCOUNTER — Other Ambulatory Visit: Payer: Self-pay

## 2019-02-24 VITALS — BP 133/97 | HR 107 | Temp 97.8°F | Resp 18 | Ht 68.0 in | Wt 208.0 lb

## 2019-02-24 DIAGNOSIS — Z79899 Other long term (current) drug therapy: Secondary | ICD-10-CM | POA: Diagnosis not present

## 2019-02-24 DIAGNOSIS — Z923 Personal history of irradiation: Secondary | ICD-10-CM | POA: Diagnosis not present

## 2019-02-24 DIAGNOSIS — Z90722 Acquired absence of ovaries, bilateral: Secondary | ICD-10-CM | POA: Diagnosis not present

## 2019-02-24 DIAGNOSIS — C787 Secondary malignant neoplasm of liver and intrahepatic bile duct: Secondary | ICD-10-CM | POA: Diagnosis not present

## 2019-02-24 DIAGNOSIS — Z9079 Acquired absence of other genital organ(s): Secondary | ICD-10-CM | POA: Diagnosis not present

## 2019-02-24 DIAGNOSIS — Z170 Estrogen receptor positive status [ER+]: Secondary | ICD-10-CM

## 2019-02-24 DIAGNOSIS — C50412 Malignant neoplasm of upper-outer quadrant of left female breast: Secondary | ICD-10-CM | POA: Diagnosis not present

## 2019-02-24 DIAGNOSIS — C50919 Malignant neoplasm of unspecified site of unspecified female breast: Secondary | ICD-10-CM | POA: Diagnosis not present

## 2019-02-24 DIAGNOSIS — Z5111 Encounter for antineoplastic chemotherapy: Secondary | ICD-10-CM | POA: Diagnosis not present

## 2019-02-24 MED ORDER — ONDANSETRON HCL 8 MG PO TABS: 8.0000 mg | ORAL_TABLET | Freq: Three times a day (TID) | ORAL | 0 refills | Status: DC | PRN

## 2019-02-24 MED ORDER — PALBOCICLIB 125 MG PO CAPS: 125.0000 mg | ORAL_CAPSULE | Freq: Every day | ORAL | 3 refills | Status: DC

## 2019-02-24 MED ORDER — PALBOCICLIB 125 MG PO TABS: 125.0000 mg | ORAL_TABLET | Freq: Every day | ORAL | 3 refills | Status: DC

## 2019-02-24 MED FILL — IBRANCE 125 MG TABS: 125 | 21 days supply | Qty: 21 | Fill #0

## 2019-02-24 NOTE — Telephone Encounter (Signed)
Oral Chemotherapy Pharmacist Encounter  Bethany Hanna plans on picking up her Leslee Home from the pharmacy this afternoon and starting today 02/24/2019.  Patient Education I spoke with patient for overview of new oral chemotherapy medication: Ibrance (palbociclib) for the treatment of metastatic breast cancer ER/PR positive, HER2 negative in conjunction with letrozole, planned duration until disease progression or unacceptable drug toxicity.   Counseled patient on administration, dosing, side effects, monitoring, drug-food interactions, safe handling, storage, and disposal. Patient will take 1 tablet (125 mg total) by mouth daily. Take for 21 days on, 7 days off, repeat every 28 days.  Side effects include but not limited to: decreased wbc/plt/hgb, fatigue, mild hair thinning, N/V.    Reviewed with patient importance of keeping a medication schedule and plan for any missed doses.  Bethany Hanna voiced understanding and appreciation. All questions answered. Medication handout and patient calendar placed in the mail.  Provided patient with Oral [facility] phone number. Patient knows to call the office with questions or concerns. Oral Chemotherapy Navigation Clinic will continue to follow.  Darl Pikes, PharmD, BCPS, [facility] Hematology/Oncology Clinical Pharmacist [facility] [phone]  02/24/2019 1:31 PM

## 2019-02-24 NOTE — Assessment & Plan Note (Signed)
05/13/2018:Evaluation of 5 months of thickening of the left breast upper outer quadrant. 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 Several lymph nodes were identified by ultrasound. T2 N1 stage IIb clinical stage  Treatment plan: 1.Neoadjuvant chemotherapy with dose dense Adriamycin and Cytoxan followed by Taxol weekly x12completed 10/08/2018 2.Breast conserving surgery with targeted node dissection9/03/2018 3.Adjuvant radiation therapy 12/30/2018-02/10/2019 4.Followed by adjuvant antiestrogen therapy (hysterectomy and bilateral salpingo-oophorectomy 02/18/2019) ----------------------------------------------------------------------------------------------------------------------------------------------------------------- 11/05/2018:Left lumpectomy [name]: residual invasive carcinoma s/p neoadjuvant treatment, HER-2 - (0), ER+ 100%, PR+ 60%, Ki67 10%, involved inferior and medial margins, and 8/9 left axillary lymph nodes positive for carcinoma. Resection of margins 11/18/2018: Residual cancer was identified in the left inferior margin and the final margins are clear.  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  Pathology discussion: I discussed with the patient that the presence of cancer on the ovary would indicate metastatic disease.  We suppose that the nodules in the liver are also sites of metastatic disease.  Plan: 1.  PET CT scan 2.  Start therapy with Ibrance along with letrozole.  Ibrance: I discussed the risks and benefits of Ibrance including  myelosuppression especially neutropenia and with that risk of infection, there is risk of pulmonary embolism and mild peripheral neuropathy as well. Fatigue, nausea, diarrhea, decreased appetite as well as alopecia and thrombocytopenia are also potential side effects of Ibrance  Return to clinic in 2 weeks for lab check and follow-up.

## 2019-02-24 NOTE — Telephone Encounter (Signed)
Oral Oncology Pharmacist Encounter  Received new prescription for Ibrance (palbociclib) for the treatment of metastatic breast cancer ER/PR positive, HER2 negative in conjunction with letrozole, planned duration until disease progression or unacceptable drug toxicity.  CMP from 10/08/2018 assessed, no relevant lab abnormalities. Recommend repeating CMP at next visit to assess for hepatic impairment. Prescription dose and frequency assessed.   Current medication list in Epic reviewed, no relevant DDIs with Ibrance identified.  Prescription has been e-scribed to the [facility] for benefits analysis and approval.  Oral Oncology Clinic will continue to follow for insurance authorization, copayment issues, initial counseling and start date.  Darl Pikes, PharmD, BCPS, [facility] Hematology/Oncology Clinical Pharmacist [facility] Oral [place] [phone]  02/24/2019 11:47 AM

## 2019-02-25 ENCOUNTER — Telehealth: Payer: Self-pay | Admitting: Hematology and Oncology

## 2019-02-25 ENCOUNTER — Encounter: Payer: Self-pay | Admitting: *Deleted

## 2019-02-25 MED ORDER — PALBOCICLIB 125 MG PO TABS: 125.0000 mg | ORAL_TABLET | Freq: Every day | ORAL | 2 refills | Status: DC

## 2019-02-25 NOTE — Addendum Note (Signed)
Addended by: Darl Pikes on: 02/25/2019 04:39 PM   Modules accepted: Orders

## 2019-02-25 NOTE — Telephone Encounter (Signed)
I left a message regarding schedule  

## 2019-02-25 NOTE — Telephone Encounter (Signed)
Oral Chemotherapy Pharmacist Encounter  Due to insurance restriction the medication could not be filled at [facility]. Prescription has been e-scribed to [employer] [employer]  Supportive information was faxed to [place]. We will continue to follow medication access.   [employer] will be used to fill the second cycle of Bethany Hanna's Bethany Hanna, she received her first cycle from [employer].  Darl Pikes, PharmD, BCPS, [facility] Hematology/Oncology Clinical Pharmacist [facility] Oral [place] [phone]  02/25/2019 3:51 PM

## 2019-02-26 ENCOUNTER — Telehealth: Payer: Self-pay

## 2019-02-26 NOTE — Telephone Encounter (Signed)
Oral Oncology Patient Advocate Encounter  Prior Authorization for Bethany Hanna has been approved.    PA# [id number] Effective dates: 02/26/19 through 08/27/19  Patients is filling at [facility] due to insurance requirements.  Oral Oncology Clinic will continue to follow.    [place] Patient [name] Phone [phone] Fax [phone] 02/26/2019 4:04 PM

## 2019-02-26 NOTE — Telephone Encounter (Signed)
Oral Oncology Patient Advocate Encounter  Received notification from [place] that prior authorization for Bethany Hanna is required.  PA submitted on CoverMyMeds Key [id number] Status is pending  Oral Oncology Clinic will continue to follow.  [place] Patient [place] Phone [phone] Fax [phone] 02/26/2019 1:25 PM

## 2019-02-27 NOTE — Progress Notes (Signed)
Caris request successfully faxed to [phone].

## 2019-03-05 ENCOUNTER — Other Ambulatory Visit: Payer: Self-pay | Admitting: Hematology and Oncology

## 2019-03-05 ENCOUNTER — Other Ambulatory Visit: Payer: Self-pay | Admitting: *Deleted

## 2019-03-05 DIAGNOSIS — C50412 Malignant neoplasm of upper-outer quadrant of left female breast: Secondary | ICD-10-CM

## 2019-03-05 DIAGNOSIS — Z170 Estrogen receptor positive status [ER+]: Secondary | ICD-10-CM

## 2019-03-05 MED ORDER — LORAZEPAM 0.5 MG PO TABS: ORAL_TABLET | ORAL | 0 refills | Status: DC

## 2019-03-09 NOTE — Progress Notes (Signed)
Patient Care Team: [name], MD as PCP - General (Family Medicine) Mauro Kaufmann, RN as Oncology Nurse Navigator Rockwell Germany, RN as Oncology Nurse Navigator  DIAGNOSIS:    ICD-10-CM   1. Malignant neoplasm of upper-outer quadrant of left breast in female, estrogen receptor positive ([place])  C50.412 CBC with Differential ([place])   Z17.0 [place] ([place] only)    SUMMARY OF ONCOLOGIC HISTORY: Oncology History  Malignant neoplasm of upper-outer quadrant of left breast in female, estrogen receptor positive ([place])  05/13/2018 Initial Diagnosis   Evaluation of 5 months of thickening of the left breast upper outer quadrant.  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  Several lymph nodes were identified by ultrasound.  T2 N1 stage IIb clinical stage   05/16/2018 Cancer Staging   Staging form: Breast, AJCC 8th Edition - Clinical stage from 05/16/2018: Stage IIB (cT2, cN1, cM0, G3, ER+, PR+, HER2-) - Signed by [name], MD on 05/16/2018   05/28/2018 -  Neo-Adjuvant Chemotherapy   Neoadjuvant chemotherapy with dose dense Adriamycin and Cytoxan followed by Taxol weekly x12   08/16/2018 Genetic Testing   Negative genetic testing on the multi-cancer panel.  The Multi-Gene Panel offered by [facility] includes sequencing and/or deletion duplication testing of the following 85 genes: AIP, ALK, APC, ATM, AXIN2,BAP1,  BARD1, BLM, BMPR1A, BRCA1, BRCA2, BRIP1, CASR, CDC73, CDH1, CDK4, CDKN1B, CDKN1C, CDKN2A (p14ARF), CDKN2A (p16INK4a), CEBPA, CHEK2, CTNNA1, DICER1, DIS3L2, EGFR (c.2369C>T, p.Thr790Met variant only), EPCAM (Deletion/duplication testing only), FH, FLCN, GATA2, GPC3, GREM1 (Promoter region deletion/duplication testing only),  HOXB13 (c.251G>A, p.Gly84Glu), HRAS, KIT, MAX, MEN1, MET, MITF (c.952G>A, p.Glu318Lys variant only), MLH1, MSH2, MSH3, MSH6, MUTYH, NBN, NF1, NF2, NTHL1, PALB2, PDGFRA, PHOX2B, PMS2, POLD1, POLE, POT1, PRKAR1A, PTCH1, PTEN, RAD50, RAD51C, RAD51D, RB1, RECQL4, RET, RNF43, RUNX1, SDHAF2, SDHA (sequence changes only), SDHB, SDHC, SDHD, SMAD4, SMARCA4, SMARCB1, SMARCE1, STK11, SUFU, TERC, TERT, TMEM127, TP53, TSC1, TSC2, VHL, WRN and WT1.  The report date is August 16, 2018.   10/07/2018 Breast MRI   Significant improvement of left breast malignancy.  Non-mass enhancement 3.7 cm.  Significant improvement in the left axillary lymph nodes previously there were 6 nodes and currently there was a single lymph node with mild cortical thickening.   11/05/2018 Surgery   Left lumpectomy [name]: residual invasive carcinoma s/p neoadjuvant treatment, HER-2 - (0), ER+ 100%, PR+ 60%, Ki67 10%, involved inferior and medial margins, and 8/9 left axillary lymph nodes positive for carcinoma.   11/05/2018 Cancer Staging   Staging form: Breast, AJCC 8th Edition - Pathologic stage from 11/05/2018: No Stage Recommended (ypT2, pN2a, cM0, ER+, PR+, HER2-) - Signed by [name], NP on 11/27/2018   11/18/2018 Surgery   Re-excision of left lumpectomy [name]: residual invasive carcinoma, clear margins.   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 with above-mentioned history of metastatic breast cancer currently on treatment with Ibrance and letrozole. She presents to the clinic today for  a toxicity check.  She is tolerating Ibrance  extremely well without any problems or concerns.    ALLERGIES:  has No Known Allergies.  MEDICATIONS:  Current Outpatient Medications  Medication Sig Dispense Refill  . acetaminophen (TYLENOL) 500 MG tablet Take 1,000 mg by mouth every 6 (six) hours as needed.    . ALPRAZolam (XANAX) 0.5 MG tablet Take 1 tablet (0.5 mg total) by mouth at bedtime as needed for anxiety. (Patient taking differently: Take 0.5 mg by mouth at bedtime as needed for anxiety. ) 60 tablet 3  . anastrozole (ARIMIDEX) 1 MG tablet Take 1 tablet (1 mg total) by mouth daily. (Patient not taking: Reported on 02/13/2019) 90 tablet 3  . calcium carbonate (TUMS - DOSED IN MG ELEMENTAL CALCIUM) 500 MG chewable tablet Chew 1 tablet by mouth as needed for indigestion or heartburn.     . gabapentin (NEURONTIN) 100 MG capsule Take 1 capsule (100 mg total) by mouth at bedtime. (Patient not taking: Reported on 02/13/2019) 30 capsule 1  . Goserelin Acetate (ZOLADEX [place]) Inject into the skin every 28 (twenty-eight) days.    Marland Kitchen ibuprofen (ADVIL) 200 MG tablet Take 600 mg by mouth every 6 (six) hours as needed.    Marland Kitchen LORazepam (ATIVAN) 0.5 MG tablet TAKE 1 TABLET(0.5 MG) BY MOUTH AT BEDTIME AS NEEDED FOR NAUSEA OR VOMITING 30 tablet 0  . ondansetron (ZOFRAN) 8 MG tablet Take 1 tablet (8 mg total) by mouth every 8 (eight) hours as needed for nausea or vomiting. 20 tablet 0  . oxyCODONE-acetaminophen (PERCOCET/ROXICET) 5-325 MG tablet Take 1-2 tablets by mouth every 4 (four) hours as needed for severe pain. 30 tablet 0  . palbociclib (IBRANCE) 125 MG tablet Take 1 tablet (125 mg total) by mouth daily. Take for 21 days on, 7 days off, repeat every 28 days. 21 tablet 2   No current facility-administered medications for this visit.    PHYSICAL EXAMINATION: ECOG PERFORMANCE STATUS: 0 - Asymptomatic  Vitals:   03/10/19 1423  BP: (!) 144/113  Pulse: (!) 107  Resp: 17  Temp: 98.3 F (36.8 C)  SpO2: 100%   Filed Weights   03/10/19 1423    Weight: 210 lb (95.3 kg)      LABORATORY DATA:  I have reviewed the data as listed CMP Latest Ref Rng & Units 02/18/2019 11/18/2018 10/31/2018  Glucose 70 - 99 mg/dL 94 105(H) 105(H)  BUN 6 - 20 mg/dL 9 5(L) 9  Creatinine 0.44 - 1.00 mg/dL 0.62 0.71 0.65  Sodium 135 - 145 mmol/L 140 137 137  Potassium 3.5 - 5.1 mmol/L 3.5 3.7 3.9  Chloride 98 - 111 mmol/L 105 104 103  CO2 22 - 32 mmol/L _0 Calcium 8.9 - 10.3 mg/dL 9.8 9.9 9.6  Total Protein 6.5 - 8.1 g/dL - - -  Total Bilirubin 0.3 - 1.2 mg/dL - - -  Alkaline Phos 38 - 126 U/L - - -  AST 15 - 41 U/L - - -  ALT 0 - 44 U/L - - -    Lab Results  Component Value Date   WBC 3.0 (L) 03/10/2019   HGB 12.0 03/10/2019   HCT 36.0 03/10/2019   MCV 91.8 03/10/2019   PLT 264 03/10/2019   NEUTROABS PENDING 03/10/2019    ASSESSMENT & PLAN:  Malignant neoplasm of upper-outer quadrant of left breast in female, estrogen receptor positive ([place]) 05/13/2018:Evaluation of 5 months of thickening of the left breast upper outer quadrant. Initial mammogram  revealed 2 cm irregular mass, ultrasound revealed 4 cm irregular mass with spiculated margin 2 o'clock position middle depth biopsy revealed invasive mammary carcinoma grade 3 that was ER PR positive HER-2 negative, left axillary lymph node biopsy positive for breast cancer. Several lymph nodes were identified by ultrasound. T2 N1 stage IIb clinical stage  Treatment plan: 1.Neoadjuvant chemotherapy with dose dense Adriamycin and Cytoxan followed by Taxol weekly x12completed 10/08/2018 2.Breast conserving surgery with targeted node dissection9/03/2018 3.Adjuvant radiation therapy10/26/2020-02/10/2019 4.Followed by adjuvant antiestrogen therapy (hysterectomy and bilateral salpingo-oophorectomy 02/18/2019) ----------------------------------------------------------------------------------------------------------------------------------------------------------------- 11/05/2018:Left  lumpectomy [name]: residual invasive carcinoma s/p neoadjuvant treatment, HER-2 - (0), ER+ 100%, PR+ 60%, Ki67 10%, involved inferior and medial margins, and 8/9 left axillary lymph nodes positive for carcinoma. Resection of margins 11/18/2018: Residual cancer was identified in the left inferior margin and the final margins are clear.  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  PET-CT 03/21/2019 Current Treatment: Ibrance along with letrozole started 02/24/19 Ibrance Toxicities: Denies any nausea vomiting denies any fatigue. Return to clinic in 2 weeks to discuss results of PET/CT scan and for follow-up with cycle 2  Orders Placed This Encounter  Procedures  . CBC with Differential (Cancer Center Only)    Standing Status:   Future    Standing Expiration Date:   03/09/2020  . CMP ([place] only)    Standing Status:   Future    Standing Expiration Date:   03/09/2020   The patient has a good understanding of the overall plan. she agrees with it. she will call with any problems that may develop before the next visit here.  [name], MD 03/10/2019  Julious Oka Dorshimer, am acting as scribe for Dr. [name].  I have reviewed the above document for accuracy and completeness, and I agree with the above.

## 2019-03-10 ENCOUNTER — Ambulatory Visit (HOSPITAL_COMMUNITY): Payer: BC Managed Care – PPO

## 2019-03-10 ENCOUNTER — Other Ambulatory Visit: Payer: Self-pay

## 2019-03-10 ENCOUNTER — Inpatient Hospital Stay: Payer: BC Managed Care – PPO

## 2019-03-10 ENCOUNTER — Inpatient Hospital Stay: Payer: BC Managed Care – PPO | Attending: Hematology and Oncology | Admitting: Hematology and Oncology

## 2019-03-10 ENCOUNTER — Ambulatory Visit: Payer: 59

## 2019-03-10 DIAGNOSIS — T451X5A Adverse effect of antineoplastic and immunosuppressive drugs, initial encounter: Secondary | ICD-10-CM | POA: Insufficient documentation

## 2019-03-10 DIAGNOSIS — Z79899 Other long term (current) drug therapy: Secondary | ICD-10-CM | POA: Insufficient documentation

## 2019-03-10 DIAGNOSIS — C50412 Malignant neoplasm of upper-outer quadrant of left female breast: Secondary | ICD-10-CM

## 2019-03-10 DIAGNOSIS — Z170 Estrogen receptor positive status [ER+]: Secondary | ICD-10-CM

## 2019-03-10 DIAGNOSIS — C7960 Secondary malignant neoplasm of unspecified ovary: Secondary | ICD-10-CM | POA: Diagnosis not present

## 2019-03-10 DIAGNOSIS — C787 Secondary malignant neoplasm of liver and intrahepatic bile duct: Secondary | ICD-10-CM | POA: Insufficient documentation

## 2019-03-10 DIAGNOSIS — Z923 Personal history of irradiation: Secondary | ICD-10-CM | POA: Insufficient documentation

## 2019-03-10 DIAGNOSIS — D72819 Decreased white blood cell count, unspecified: Secondary | ICD-10-CM | POA: Insufficient documentation

## 2019-03-10 LAB — CMP (CANCER CENTER ONLY)
ALT: 19 U/L (ref 0–44)
AST: 15 U/L (ref 15–41)
Albumin: 4.4 g/dL (ref 3.5–5.0)
Alkaline Phosphatase: 88 U/L (ref 38–126)
Anion gap: 11 (ref 5–15)
BUN: 13 mg/dL (ref 6–20)
CO2: 25 mmol/L (ref 22–32)
Calcium: 9.4 mg/dL (ref 8.9–10.3)
Chloride: 101 mmol/L (ref 98–111)
Creatinine: 0.72 mg/dL (ref 0.44–1.00)
GFR, Est AFR Am: >60 mL/min (ref >60)
GFR, Estimated: >60 mL/min (ref >60)
Glucose, Bld: 107 mg/dL — ABNORMAL HIGH (ref 70–99)
Potassium: 4.1 mmol/L (ref 3.5–5.1)
Sodium: 137 mmol/L (ref 135–145)
Total Bilirubin: 0.7 mg/dL (ref 0.3–1.2)
Total Protein: 7.9 g/dL (ref 6.5–8.1)

## 2019-03-10 LAB — CBC WITH DIFFERENTIAL (CANCER CENTER ONLY)
Abs Immature Granulocytes: 0.01 10*3/uL (ref 0.00–0.07)
Basophils Absolute: 0 10*3/uL (ref 0.0–0.1)
Basophils Relative: 1 %
Eosinophils Absolute: 0.1 10*3/uL (ref 0.0–0.5)
Eosinophils Relative: 2 %
HCT: 36 % (ref 36.0–46.0)
Hemoglobin: 12 g/dL (ref 12.0–15.0)
Immature Granulocytes: 0 %
Lymphocytes Relative: 22 %
Lymphs Abs: 0.6 10*3/uL — ABNORMAL LOW (ref 0.7–4.0)
MCH: 30.6 pg (ref 26.0–34.0)
MCHC: 33.3 g/dL (ref 30.0–36.0)
MCV: 91.8 fL (ref 80.0–100.0)
Monocytes Absolute: 0.1 10*3/uL (ref 0.1–1.0)
Monocytes Relative: 4 %
Neutro Abs: 2.1 10*3/uL (ref 1.7–7.7)
Neutrophils Relative %: 71 %
Platelet Count: 264 10*3/uL (ref 150–400)
RBC: 3.92 MIL/uL (ref 3.87–5.11)
RDW: 12.9 % (ref 11.5–15.5)
WBC Count: 3 10*3/uL — ABNORMAL LOW (ref 4.0–10.5)
nRBC: 0 % (ref 0.0–0.2)

## 2019-03-10 NOTE — Assessment & Plan Note (Signed)
05/13/2018:Evaluation of 5 months of thickening of the left breast upper outer quadrant. 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 Several lymph nodes were identified by ultrasound. T2 N1 stage IIb clinical stage  Treatment plan: 1.Neoadjuvant chemotherapy with dose dense Adriamycin and Cytoxan followed by Taxol weekly x12completed 10/08/2018 2.Breast conserving surgery with targeted node dissection9/03/2018 3.Adjuvant radiation therapy10/26/2020-02/10/2019 4.Followed by adjuvant antiestrogen therapy (hysterectomy and bilateral salpingo-oophorectomy 02/18/2019) ----------------------------------------------------------------------------------------------------------------------------------------------------------------- 11/05/2018:Left lumpectomy ([place]): residual invasive carcinoma s/p neoadjuvant treatment, HER-2 - (0), ER+ 100%, PR+ 60%, Ki67 10%, involved inferior and medial margins, and 8/9 left axillary lymph nodes positive for carcinoma. Resection of margins 11/18/2018: Residual cancer was identified in the left inferior margin and the final margins are clear.  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  PET-CT 03/21/2019 Current Treatment: Ibrance along with letrozole started 02/24/19 Ibrance Toxicities:     

## 2019-03-11 ENCOUNTER — Telehealth: Payer: Self-pay | Admitting: Hematology and Oncology

## 2019-03-11 NOTE — Telephone Encounter (Signed)
I talk with patient regarding schedule  

## 2019-03-17 ENCOUNTER — Ambulatory Visit: Payer: BC Managed Care – PPO | Admitting: Radiation Oncology

## 2019-03-17 ENCOUNTER — Encounter: Payer: Self-pay | Admitting: *Deleted

## 2019-03-17 NOTE — Progress Notes (Signed)
Received faxed denial for pt to receive Caris testing. Per Caris request, RN faxed successful copy to [facility] for further investigation [phone])

## 2019-03-21 ENCOUNTER — Ambulatory Visit (HOSPITAL_COMMUNITY)
Admission: RE | Admit: 2019-03-21 | Discharge: 2019-03-21 | Disposition: A | Discharge status: Home / Self Care | Payer: BC Managed Care – PPO | Source: Ambulatory Visit | Attending: Hematology and Oncology | Admitting: Hematology and Oncology

## 2019-03-21 ENCOUNTER — Other Ambulatory Visit: Payer: Self-pay

## 2019-03-21 DIAGNOSIS — C787 Secondary malignant neoplasm of liver and intrahepatic bile duct: Secondary | ICD-10-CM | POA: Diagnosis not present

## 2019-03-21 DIAGNOSIS — C50919 Malignant neoplasm of unspecified site of unspecified female breast: Secondary | ICD-10-CM | POA: Insufficient documentation

## 2019-03-21 DIAGNOSIS — C569 Malignant neoplasm of unspecified ovary: Secondary | ICD-10-CM | POA: Diagnosis not present

## 2019-03-21 LAB — GLUCOSE, CAPILLARY: Glucose-Capillary: 92 mg/dL (ref 70–99)

## 2019-03-21 MED ORDER — FLUDEOXYGLUCOSE F - 18 (FDG) INJECTION
10.4000 | Freq: Once | INTRAVENOUS | Status: AC | PRN
  Administered 2019-03-21: 10.4 via INTRAVENOUS

## 2019-03-23 NOTE — Progress Notes (Signed)
Patient Care Team: Bethany Olp, MD as PCP - General (Family Medicine) Bethany Kaufmann, RN as Oncology Nurse Navigator Bethany Germany, RN as Oncology Nurse Navigator  DIAGNOSIS:    ICD-10-CM   1. Malignant neoplasm of upper-outer quadrant of left breast in female, estrogen receptor positive ([place])  C50.412    Z17.0     SUMMARY OF ONCOLOGIC HISTORY: Oncology History  Malignant neoplasm of upper-outer quadrant of left breast in female, estrogen receptor positive ([place])  05/13/2018 Initial Diagnosis   Evaluation of 5 months of thickening of the left breast upper outer quadrant.  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  Several lymph nodes were identified by ultrasound.  T2 N1 stage IIb clinical stage   05/16/2018 Cancer Staging   Staging form: Breast, AJCC 8th Edition - Clinical stage from 05/16/2018: Stage IIB (cT2, cN1, cM0, G3, ER+, PR+, HER2-) - Signed by Bethany Lose, MD on 05/16/2018   05/28/2018 -  Neo-Adjuvant Chemotherapy   Neoadjuvant chemotherapy with dose dense Adriamycin and Cytoxan followed by Taxol weekly x12   08/16/2018 Genetic Testing   Negative genetic testing on the multi-cancer panel.  The Multi-Gene Panel offered by [facility] includes sequencing and/or deletion duplication testing of the following 85 genes: AIP, ALK, APC, ATM, AXIN2,BAP1,  BARD1, BLM, BMPR1A, BRCA1, BRCA2, BRIP1, CASR, CDC73, CDH1, CDK4, CDKN1B, CDKN1C, CDKN2A (p14ARF), CDKN2A (p16INK4a), CEBPA, CHEK2, CTNNA1, DICER1, DIS3L2, EGFR (c.2369C>T, p.Thr790Met variant only), EPCAM (Deletion/duplication testing only), FH, FLCN, GATA2, GPC3, GREM1 (Promoter region deletion/duplication testing only), HOXB13 (c.251G>A, p.Gly84Glu), HRAS, KIT, MAX, MEN1, MET, MITF  (c.952G>A, p.Glu318Lys variant only), MLH1, MSH2, MSH3, MSH6, MUTYH, NBN, NF1, NF2, NTHL1, PALB2, PDGFRA, PHOX2B, PMS2, POLD1, POLE, POT1, PRKAR1A, PTCH1, PTEN, RAD50, RAD51C, RAD51D, RB1, RECQL4, RET, RNF43, RUNX1, SDHAF2, SDHA (sequence changes only), SDHB, SDHC, SDHD, SMAD4, SMARCA4, SMARCB1, SMARCE1, STK11, SUFU, TERC, TERT, TMEM127, TP53, TSC1, TSC2, VHL, WRN and WT1.  The report date is August 16, 2018.   10/07/2018 Breast MRI   Significant improvement of left breast malignancy.  Non-mass enhancement 3.7 cm.  Significant improvement in the left axillary lymph nodes previously there were 6 nodes and currently there was a single lymph node with mild cortical thickening.   11/05/2018 Surgery   Left lumpectomy Bethany Hanna): residual invasive carcinoma s/p neoadjuvant treatment, HER-2 - (0), ER+ 100%, PR+ 60%, Ki67 10%, involved inferior and medial margins, and 8/9 left axillary lymph nodes positive for carcinoma.   11/05/2018 Cancer Staging   Staging form: Breast, AJCC 8th Edition - Pathologic stage from 11/05/2018: No Stage Recommended (ypT2, pN2a, cM0, ER+, PR+, HER2-) - Signed by Bethany Phlegm, NP on 11/27/2018   11/18/2018 Surgery   Re-excision of left lumpectomy Bethany Hanna): residual invasive carcinoma, clear margins.   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 with above-mentioned history of metastatic breast cancer currently on treatment with Ibrance and letrozole. PET scan on 03/21/19 showed hypermetabolic right cervical nodes, possibly metastatic,  nonspecific right pulmonary nodules, and  left upper lobe hypermetabolic airspace disease. She presents to the clinic today for a toxicity check and to review her scan.  She has tolerated Ibrance extremely well.  She does not have any fatigue or nausea vomiting.  She is able to work full-time.  ALLERGIES:  has No Known Allergies.  MEDICATIONS:  Current Outpatient Medications  Medication Sig Dispense Refill  . acetaminophen (TYLENOL) 500 MG tablet Take 1,000 mg by mouth every 6 (six) hours as needed.    . ALPRAZolam (XANAX) 0.5 MG tablet Take 1 tablet (0.5 mg total) by mouth at bedtime as needed for anxiety. (Patient taking differently: Take 0.5 mg by mouth at bedtime as needed for anxiety. ) 60 tablet 3  . anastrozole (ARIMIDEX) 1 MG tablet Take 1 tablet (1 mg total) by mouth daily. (Patient not taking: Reported on 02/13/2019) 90 tablet 3  . calcium carbonate (TUMS - DOSED IN MG ELEMENTAL CALCIUM) 500 MG chewable tablet Chew 1 tablet by mouth as needed for indigestion or heartburn.     . gabapentin (NEURONTIN) 100 MG capsule Take 1 capsule (100 mg total) by mouth at bedtime. (Patient not taking: Reported on 02/13/2019) 30 capsule 1  . Goserelin Acetate (ZOLADEX [place]) Inject into the skin every 28 (twenty-eight) days.    Marland Kitchen ibuprofen (ADVIL) 200 MG tablet Take 600 mg by mouth every 6 (six) hours as needed.    Marland Kitchen LORazepam (ATIVAN) 0.5 MG tablet TAKE 1 TABLET(0.5 MG) BY MOUTH AT BEDTIME AS NEEDED FOR NAUSEA OR VOMITING 30 tablet 0  . ondansetron (ZOFRAN) 8 MG tablet Take 1 tablet (8 mg total) by mouth every 8 (eight) hours as needed for nausea or vomiting. 20 tablet 0  . oxyCODONE-acetaminophen (PERCOCET/ROXICET) 5-325 MG tablet Take 1-2 tablets by mouth every 4 (four) hours as needed for severe pain. 30 tablet 0  . palbociclib (IBRANCE) 125 MG tablet Take 1 tablet (125 mg total) by mouth daily. Take for 21 days on, 7 days off, repeat every 28 days. 21 tablet 2   No current facility-administered medications for this visit.    PHYSICAL  EXAMINATION: ECOG PERFORMANCE STATUS: 1 - Symptomatic but completely ambulatory  Vitals:   03/24/19 1428  BP: (!) 143/84  Pulse: (!) 108  Resp: 20  Temp: 98.5 F (36.9 C)  SpO2: 100%   Filed Weights   03/24/19 1428  Weight: 206 lb 4.8 oz (93.6 kg)    LABORATORY DATA:  I have reviewed the data as listed CMP Latest Ref Rng & Units 03/10/2019 02/18/2019 11/18/2018  Glucose 70 - 99 mg/dL 107(H) 94 105(H)  BUN 6 - 20 mg/dL 13 9 5(L)  Creatinine 0.44 - 1.00 mg/dL 0.72 0.62 0.71  Sodium 135 - 145 mmol/L 137 140 137  Potassium 3.5 - 5.1 mmol/L 4.1 3.5 3.7  Chloride 98 - 111 mmol/L 101 105 104  CO2 22 - 32 mmol/L 25 23 22   Calcium 8.9 - 10.3 mg/dL 9.4 9.8 9.9  Total Protein 6.5 - 8.1 g/dL 7.9 - -  Total Bilirubin 0.3 - 1.2 mg/dL 0.7 - -  Alkaline Phos 38 - 126 U/L 88 - -  AST 15 - 41 U/L 15 - -  ALT 0 - 44 U/L 19 - -    Lab Results  Component Value Date   WBC 2.3 (L) 03/24/2019   HGB 12.1 03/24/2019   HCT 34.8 (L) 03/24/2019   MCV 91.8 03/24/2019   PLT 244 03/24/2019   NEUTROABS 1.3 (L)  03/24/2019    ASSESSMENT & PLAN:  Malignant neoplasm of upper-outer quadrant of left breast in female, estrogen receptor positive ([place]) 05/13/2018:Evaluation of 5 months of thickening of the left breast upper outer quadrant. 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 Several lymph nodes were identified by ultrasound. T2 N1 stage IIb clinical stage  Treatment plan: 1.Neoadjuvant chemotherapy with dose dense Adriamycin and Cytoxan followed by Taxol weekly x12completed 10/08/2018 2.Breast conserving surgery with targeted node dissection9/03/2018 3.Adjuvant radiation therapy10/26/2020-02/10/2019 4.Followed by adjuvant antiestrogen  therapy(hysterectomy and bilateral salpingo-oophorectomy 02/18/2019) ----------------------------------------------------------------------------------------------------------------------------------------------------------------- 11/05/2018:Left lumpectomy Bethany Hanna): residual invasive carcinoma s/p neoadjuvant treatment, HER-2 - (0), ER+ 100%, PR+ 60%, Ki67 10%, involved inferior and medial margins, and 8/9 left axillary lymph nodes positive for carcinoma. Resection of margins 11/18/2018: Residual cancer was identified in the left inferior margin and the final margins are clear.  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  PET-CT 03/21/2019 Current Treatment: Ibrance along with letrozole started 02/24/19, today cycle 2-day 1 Ibrance Toxicities: Denies any nausea vomiting denies any fatigue.  PET CT scan 03/20/2018: Right sided cervical lymph nodes which could be reactive metastatic.  Otherwise no other findings of metastatic disease.  Left upper lobe hypermetabolic airspace disease is radiation related.  Nonspecific right-sided pulmonary nodules below PET resolution  Ibrance toxicities: Denies any adverse effects to [employer]. Leukopenia: ANC 1.3 so we will continue the dosage at 125 mg.  Return to clinic in 4 weeks for toxicity check and follow-up     No orders of the defined types were placed in this encounter.  The patient has a good understanding of the overall plan. she agrees with it. she will call with any problems that may develop before the next visit here.  Total time spent: 30 mins including face to face time and time spent for planning, charting and coordination of care  Bethany Lose, MD 03/24/2019  I, Bethany Hanna, am acting as scribe for Dr. Nicholas Hanna.  I have reviewed the above  documentation for accuracy and completeness, and I agree with the above.

## 2019-03-24 ENCOUNTER — Inpatient Hospital Stay: Payer: BC Managed Care – PPO

## 2019-03-24 ENCOUNTER — Inpatient Hospital Stay (HOSPITAL_BASED_OUTPATIENT_CLINIC_OR_DEPARTMENT_OTHER): Payer: BC Managed Care – PPO | Admitting: Hematology and Oncology

## 2019-03-24 ENCOUNTER — Other Ambulatory Visit: Payer: Self-pay

## 2019-03-24 DIAGNOSIS — Z170 Estrogen receptor positive status [ER+]: Secondary | ICD-10-CM | POA: Diagnosis not present

## 2019-03-24 DIAGNOSIS — C7960 Secondary malignant neoplasm of unspecified ovary: Secondary | ICD-10-CM | POA: Diagnosis not present

## 2019-03-24 DIAGNOSIS — C787 Secondary malignant neoplasm of liver and intrahepatic bile duct: Secondary | ICD-10-CM | POA: Diagnosis not present

## 2019-03-24 DIAGNOSIS — C50412 Malignant neoplasm of upper-outer quadrant of left female breast: Secondary | ICD-10-CM | POA: Diagnosis not present

## 2019-03-24 DIAGNOSIS — T451X5A Adverse effect of antineoplastic and immunosuppressive drugs, initial encounter: Secondary | ICD-10-CM | POA: Diagnosis not present

## 2019-03-24 DIAGNOSIS — D72819 Decreased white blood cell count, unspecified: Secondary | ICD-10-CM | POA: Diagnosis not present

## 2019-03-24 DIAGNOSIS — Z79899 Other long term (current) drug therapy: Secondary | ICD-10-CM | POA: Diagnosis not present

## 2019-03-24 DIAGNOSIS — Z923 Personal history of irradiation: Secondary | ICD-10-CM | POA: Diagnosis not present

## 2019-03-24 LAB — CMP (CANCER CENTER ONLY)
ALT: 38 U/L (ref 0–44)
AST: 35 U/L (ref 15–41)
Albumin: 4.2 g/dL (ref 3.5–5.0)
Alkaline Phosphatase: 95 U/L (ref 38–126)
Anion gap: 11 (ref 5–15)
BUN: 8 mg/dL (ref 6–20)
CO2: 24 mmol/L (ref 22–32)
Calcium: 8.8 mg/dL — ABNORMAL LOW (ref 8.9–10.3)
Chloride: 103 mmol/L (ref 98–111)
Creatinine: 0.78 mg/dL (ref 0.44–1.00)
GFR, Est AFR Am: >60 mL/min (ref >60)
GFR, Estimated: >60 mL/min (ref >60)
Glucose, Bld: 95 mg/dL (ref 70–99)
Potassium: 3.7 mmol/L (ref 3.5–5.1)
Sodium: 138 mmol/L (ref 135–145)
Total Bilirubin: 0.3 mg/dL (ref 0.3–1.2)
Total Protein: 7.6 g/dL (ref 6.5–8.1)

## 2019-03-24 LAB — CBC WITH DIFFERENTIAL (CANCER CENTER ONLY)
Abs Immature Granulocytes: 0.02 10*3/uL (ref 0.00–0.07)
Basophils Absolute: 0 10*3/uL (ref 0.0–0.1)
Basophils Relative: 1 %
Eosinophils Absolute: 0 10*3/uL (ref 0.0–0.5)
Eosinophils Relative: 1 %
HCT: 34.8 % — ABNORMAL LOW (ref 36.0–46.0)
Hemoglobin: 12.1 g/dL (ref 12.0–15.0)
Immature Granulocytes: 1 %
Lymphocytes Relative: 25 %
Lymphs Abs: 0.6 10*3/uL — ABNORMAL LOW (ref 0.7–4.0)
MCH: 31.9 pg (ref 26.0–34.0)
MCHC: 34.8 g/dL (ref 30.0–36.0)
MCV: 91.8 fL (ref 80.0–100.0)
Monocytes Absolute: 0.4 10*3/uL (ref 0.1–1.0)
Monocytes Relative: 15 %
Neutro Abs: 1.3 10*3/uL — ABNORMAL LOW (ref 1.7–7.7)
Neutrophils Relative %: 57 %
Platelet Count: 244 10*3/uL (ref 150–400)
RBC: 3.79 MIL/uL — ABNORMAL LOW (ref 3.87–5.11)
RDW: 15.7 % — ABNORMAL HIGH (ref 11.5–15.5)
WBC Count: 2.3 10*3/uL — ABNORMAL LOW (ref 4.0–10.5)
nRBC: 0 % (ref 0.0–0.2)

## 2019-03-24 NOTE — Assessment & Plan Note (Signed)
05/13/2018:Evaluation of 5 months of thickening of the left breast upper outer quadrant. 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 Several lymph nodes were identified by ultrasound. T2 N1 stage IIb clinical stage  Treatment plan: 1.Neoadjuvant chemotherapy with dose dense Adriamycin and Cytoxan followed by Taxol weekly x12completed 10/08/2018 2.Breast conserving surgery with targeted node dissection9/03/2018 3.Adjuvant radiation therapy10/26/2020-02/10/2019 4.Followed by adjuvant antiestrogen therapy(hysterectomy and bilateral salpingo-oophorectomy 02/18/2019) ----------------------------------------------------------------------------------------------------------------------------------------------------------------- 11/05/2018:Left lumpectomy Bethany Hanna): residual invasive carcinoma s/p neoadjuvant treatment, HER-2 - (0), ER+ 100%, PR+ 60%, Ki67 10%, involved inferior and medial margins, and 8/9 left axillary lymph nodes positive for carcinoma. Resection of margins 11/18/2018: Residual cancer was identified in the left inferior margin and the final margins are clear.  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  PET-CT 03/21/2019 Current Treatment: Ibrance along with letrozole started 02/24/19 Ibrance Toxicities: Denies any nausea vomiting denies any fatigue.  PET CT scan 03/20/2018: Right sided cervical lymph nodes which could be reactive metastatic.  Otherwise no other findings of metastatic disease.  Left upper lobe hypermetabolic airspace disease is radiation  related.  Nonspecific right-sided pulmonary nodules below PET resolution  Ibrance toxicities:  Return to clinic in 2 weeks for toxicity check and follow-up

## 2019-03-25 ENCOUNTER — Telehealth: Payer: Self-pay | Admitting: Hematology and Oncology

## 2019-03-25 NOTE — Telephone Encounter (Signed)
I left a message regarding schedule  

## 2019-03-30 DIAGNOSIS — Z1152 Encounter for screening for COVID-19: Secondary | ICD-10-CM | POA: Diagnosis not present

## 2019-04-08 ENCOUNTER — Other Ambulatory Visit: Payer: Self-pay | Admitting: *Deleted

## 2019-04-08 DIAGNOSIS — C50412 Malignant neoplasm of upper-outer quadrant of left female breast: Secondary | ICD-10-CM

## 2019-04-08 MED ORDER — LORAZEPAM 0.5 MG PO TABS: ORAL_TABLET | ORAL | 0 refills | Status: DC

## 2019-04-21 NOTE — Progress Notes (Signed)
Patient Care Team: [name], MD as PCP - General (Family Medicine)  DIAGNOSIS:    ICD-10-CM   1. Malignant neoplasm of upper-outer quadrant of left breast in female, estrogen receptor positive ([place])  C50.412    Z17.0     SUMMARY OF ONCOLOGIC HISTORY: Oncology History  Malignant neoplasm of upper-outer quadrant of left breast in female, estrogen receptor positive ([place])  05/13/2018 Initial Diagnosis   Evaluation of 5 months of thickening of the left breast upper outer quadrant.  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  Several lymph nodes were identified by ultrasound.  T2 N1 stage IIb clinical stage   05/16/2018 Cancer Staging   Staging form: Breast, AJCC 8th Edition - Clinical stage from 05/16/2018: Stage IIB (cT2, cN1, cM0, G3, ER+, PR+, HER2-) - Signed by [name], MD on 05/16/2018   05/28/2018 - 10/08/2018 Chemotherapy   DOXOrubicin (ADRIAMYCIN) chemo injection 128 mg, 60 mg/m2 = 128 mg, Intravenous,  Once, 4 of 4 cycles. Administration: 128 mg (05/28/2018), 128 mg (06/11/2018), 128 mg (06/25/2018), 128 mg (07/09/2018)  palonosetron (ALOXI) injection 0.25 mg, 0.25 mg, Intravenous,  Once, 9 of 9 cycles. Administration: 0.25 mg (05/28/2018), 0.25 mg (06/11/2018), 0.25 mg (06/25/2018), 0.25 mg (07/09/2018), 0.25 mg (09/10/2018), 0.25 mg (09/17/2018), 0.25 mg (09/24/2018), 0.25 mg (10/01/2018), 0.25 mg (10/08/2018)  pegfilgrastim (NEULASTA ONPRO KIT) injection 6 mg, 6 mg, Subcutaneous, Once, 4 of 4 cycles. Administration: 6 mg (05/28/2018), 6 mg (06/11/2018), 6 mg (06/25/2018), 6 mg (07/09/2018)  cyclophosphamide (CYTOXAN) 1,280 mg in sodium chloride 0.9 % 250 mL chemo infusion, 600 mg/m2 = 1,280 mg, Intravenous,  Once, 4 of 4 cycles. Administration: 1,280  mg (05/28/2018), 1,280 mg (06/11/2018), 1,280 mg (06/25/2018), 1,280 mg (07/09/2018)  PACLitaxel (TAXOL) 168 mg in sodium chloride 0.9 % 250 mL chemo infusion (</= 69m/m2), 80 mg/m2 = 168 mg, Intravenous,  Once, 12 of 12 cycles. Administration: 168 mg (07/23/2018), 168 mg (07/30/2018), 168 mg (08/06/2018), 168 mg (08/13/2018), 168 mg (08/20/2018), 168 mg (08/27/2018), 168 mg (09/03/2018), 168 mg (09/10/2018), 168 mg (09/17/2018), 168 mg (09/24/2018), 168 mg (10/01/2018), 168 mg (10/08/2018)  fosaprepitant (EMEND) 150 mg, dexamethasone (DECADRON) 12 mg in sodium chloride 0.9 % 145 mL IVPB, , Intravenous,  Once, 4 of 4 cycles. Administration:  (05/28/2018),  (06/11/2018),  (06/25/2018),  (07/09/2018)    08/16/2018 Genetic Testing   Negative genetic testing on the multi-cancer panel.  The Multi-Gene Panel offered by [facility] includes sequencing and/or deletion duplication testing of the following 85 genes: AIP, ALK, APC, ATM, AXIN2,BAP1,  BARD1, BLM, BMPR1A, BRCA1, BRCA2, BRIP1, CASR, CDC73, CDH1, CDK4, CDKN1B, CDKN1C, CDKN2A (p14ARF), CDKN2A (p16INK4a), CEBPA, CHEK2, CTNNA1, DICER1, DIS3L2, EGFR (c.2369C>T, p.Thr790Met variant only), EPCAM (Deletion/duplication testing only), FH, FLCN, GATA2, GPC3, GREM1 (Promoter region deletion/duplication testing only), HOXB13 (c.251G>A, p.Gly84Glu), HRAS, KIT, MAX, MEN1, MET, MITF (c.952G>A, p.Glu318Lys variant only), MLH1, MSH2, MSH3, MSH6, MUTYH, NBN, NF1, NF2, NTHL1, PALB2, PDGFRA, PHOX2B, PMS2, POLD1, POLE, POT1, PRKAR1A, PTCH1, PTEN, RAD50, RAD51C, RAD51D, RB1, RECQL4, RET, RNF43, RUNX1, SDHAF2, SDHA (sequence changes only), SDHB, SDHC, SDHD, SMAD4, SMARCA4, SMARCB1, SMARCE1, STK11, SUFU, TERC, TERT, TMEM127, TP53, TSC1, TSC2, VHL, WRN and WT1.  The report date is August 16, 2018.   10/07/2018 Breast MRI   Significant improvement of left breast malignancy.  Non-mass enhancement 3.7  cm.  Significant improvement in the left axillary lymph nodes previously there were 6 nodes and currently there was a  single lymph node with mild cortical thickening.   11/05/2018 Surgery   Left lumpectomy [name] [phone]): residual invasive carcinoma s/p neoadjuvant treatment, HER-2 - (0), ER+ 100%, PR+ 60%, Ki67 10%, involved inferior and medial margins, and 8/9 left axillary lymph nodes positive for carcinoma.   11/05/2018 Cancer Staging   Staging form: Breast, AJCC 8th Edition - Pathologic stage from 11/05/2018: No Stage Recommended (ypT2, pN2a, cM0, ER+, PR+, HER2-) - Signed by [name], NP on 11/27/2018   11/18/2018 Surgery   Re-excision of left lumpectomy [name] [phone]): residual invasive carcinoma, clear margins.   12/30/2018 - 02/14/2019 Radiation Therapy   The patient initially received a dose of 50.4 Gy in 28 fractions to the breast using whole-breast tangent fields. This was delivered using a 3-D conformal technique. The patient also received a dose of 50.4 Gy in 28 fraction to the left axilla. The pt received a boost delivering an additional 12 Gy in 6 fractions using a electron boost with 9mV electrons. The total dose was 112.8 Gy.    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 with above-mentioned history of metastatic breast cancer currently on treatment with Ibrance and letrozole.She presents to the clinic todayfor a toxicity check.  She has been working full-time and has had no problems tolerating Ibrance.  Today her liver enzymes are elevated.  She tells me that she had 2.5 glasses of wine yesterday.  Since [facility] is  concerning adverse effect.   ALLERGIES:  has No Known Allergies.  MEDICATIONS:  Current Outpatient Medications  Medication Sig Dispense Refill  . acetaminophen (TYLENOL) 500 MG tablet Take 1,000 mg by mouth every 6 (six) hours as needed.    . ALPRAZolam (XANAX) 0.5 MG tablet Take 1 tablet (0.5 mg total) by mouth at bedtime as needed for anxiety. (Patient taking differently: Take 0.5 mg by mouth at bedtime as needed for anxiety. ) 60 tablet 3  . anastrozole (ARIMIDEX) 1 MG tablet Take 1 tablet (1 mg total) by mouth daily. (Patient not taking: Reported on 02/13/2019) 90 tablet 3  . calcium carbonate (TUMS - DOSED IN MG ELEMENTAL CALCIUM) 500 MG chewable tablet Chew 1 tablet by mouth as needed for indigestion or heartburn.     . gabapentin (NEURONTIN) 100 MG capsule Take 1 capsule (100 mg total) by mouth at bedtime. (Patient not taking: Reported on 02/13/2019) 30 capsule 1  . Goserelin Acetate (ZOLADEX [place]) Inject into the skin every 28 (twenty-eight) days.    .Marland Kitchenibuprofen (ADVIL) 200 MG tablet Take 600 mg by mouth every 6 (six) hours as needed.    .Marland KitchenLORazepam (ATIVAN) 0.5 MG tablet TAKE 1 TABLET(0.5 MG) BY MOUTH AT BEDTIME AS NEEDED FOR NAUSEA OR VOMITING 30 tablet 0  . ondansetron (ZOFRAN) 8 MG tablet Take 1 tablet (8 mg total) by mouth every 8 (eight) hours as needed for nausea or vomiting. 20 tablet 0  . oxyCODONE-acetaminophen (PERCOCET/ROXICET) 5-325 MG tablet Take 1-2 tablets by mouth every 4 (four) hours  as needed for severe pain. 30 tablet 0  . palbociclib (IBRANCE) 125 MG tablet Take 1 tablet (125 mg total) by mouth daily. Take for 21 days on, 7 days off, repeat every 28 days. 21 tablet 2   No current facility-administered medications for this visit.    PHYSICAL EXAMINATION: ECOG PERFORMANCE STATUS: 1 - Symptomatic but completely ambulatory  Vitals:   04/22/19 1445  BP: (!) 142/115  Pulse: 98  Resp: 17  Temp: 98.5 F (36.9 C)  SpO2: 100%   Filed Weights   04/22/19 1445    Weight: 206 lb 14.4 oz (93.8 kg)    LABORATORY DATA:  I have reviewed the data as listed CMP Latest Ref Rng & Units 04/22/2019 03/24/2019 03/10/2019  Glucose 70 - 99 mg/dL 115(H) 95 107(H)  BUN 6 - 20 mg/dL _0 Creatinine 0.44 - 1.00 mg/dL 0.80 0.78 0.72  Sodium 135 - 145 mmol/L 141 138 137  Potassium 3.5 - 5.1 mmol/L 3.7 3.7 4.1  Chloride 98 - 111 mmol/L 106 103 101  CO2 22 - 32 mmol/L _1 Calcium 8.9 - 10.3 mg/dL 9.1 8.8(L) 9.4  Total Protein 6.5 - 8.1 g/dL 7.4 7.6 7.9  Total Bilirubin 0.3 - 1.2 mg/dL 0.4 0.3 0.7  Alkaline Phos 38 - 126 U/L 80 95 88  AST 15 - 41 U/L 67(H) 35 15  ALT 0 - 44 U/L 110(H) 38 19    Lab Results  Component Value Date   WBC 3.3 (L) 04/22/2019   HGB 11.6 (L) 04/22/2019   HCT 32.7 (L) 04/22/2019   MCV 94.5 04/22/2019   PLT 212 04/22/2019   NEUTROABS 2.2 04/22/2019    ASSESSMENT & PLAN:  Malignant neoplasm of upper-outer quadrant of left breast in female, estrogen receptor positive ([place]) 05/13/2018:Evaluation of 5 months of thickening of the left breast upper outer quadrant. 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 Several lymph nodes were identified by ultrasound. T2 N1 stage IIb clinical stage  Treatment plan: 1.Neoadjuvant chemotherapy with dose dense Adriamycin and Cytoxan followed by Taxol weekly x12completed 10/08/2018 2.Breast conserving surgery with targeted node dissection9/03/2018 3.Adjuvant radiation therapy10/26/2020-02/10/2019 4.Followed by adjuvant antiestrogen therapy(hysterectomy and bilateral salpingo-oophorectomy  02/18/2019) ----------------------------------------------------------------------------------------------------------------------------------------------------------------- 11/05/2018:Left lumpectomy [name]: residual invasive carcinoma s/p neoadjuvant treatment, HER-2 - (0), ER+ 100%, PR+ 60%, Ki67 10%, involved inferior and medial margins, and 8/9 left axillary lymph nodes positive for carcinoma. Resection of margins 11/18/2018: Residual cancer was identified in the left inferior margin and the final margins are clear.  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  PET-CT 03/21/2019 Current Treatment:Ibrance along with letrozolestarted 02/24/19, today cycle 2-day 1 Ibrance Toxicities:Denies any nausea vomiting denies any fatigue.  PET CT scan 03/20/2018: Right sided cervical lymph nodes which could be reactive metastatic.  Otherwise no other findings of metastatic disease.  Left upper lobe hypermetabolic airspace disease is radiation related.  Nonspecific right-sided pulmonary nodules below PET resolution  Ibrance toxicities: Denies any adverse effects to [place]. Leukopenia: ANC 2.2 so we will continue the dosage at 125 mg.  Elevated AST and ALT: Most likely related to [place].  I would like to recheck it in a week to  see if it continues to increase.  If it increases then we will have to stop Ibrance until the labs returned to normal and then resume it at a lower dosage. I will call her with the results of the lab work which will be done in 1 week.  Return to clinic in 4 weeks for toxicity check and follow-up    No orders of the defined types were placed in this encounter.  The patient has a good understanding of the overall plan. she agrees with it. she will call with any problems that may develop  before the next visit here.  Total time spent: 30 mins including face to face time and time spent for planning, charting and coordination of care  [name], MD 04/22/2019  I, Cloyde Reams Dorshimer, am acting as scribe for Dr. [name].  I have reviewed the above documentation for accuracy and completeness, and I agree with the above.

## 2019-04-22 ENCOUNTER — Other Ambulatory Visit: Payer: Self-pay

## 2019-04-22 ENCOUNTER — Inpatient Hospital Stay: Payer: BC Managed Care – PPO | Attending: Hematology and Oncology

## 2019-04-22 ENCOUNTER — Inpatient Hospital Stay (HOSPITAL_BASED_OUTPATIENT_CLINIC_OR_DEPARTMENT_OTHER): Payer: BC Managed Care – PPO | Admitting: Hematology and Oncology

## 2019-04-22 ENCOUNTER — Telehealth: Payer: Self-pay | Admitting: Emergency Medicine

## 2019-04-22 DIAGNOSIS — Z9221 Personal history of antineoplastic chemotherapy: Secondary | ICD-10-CM | POA: Insufficient documentation

## 2019-04-22 DIAGNOSIS — C787 Secondary malignant neoplasm of liver and intrahepatic bile duct: Secondary | ICD-10-CM | POA: Insufficient documentation

## 2019-04-22 DIAGNOSIS — C50412 Malignant neoplasm of upper-outer quadrant of left female breast: Secondary | ICD-10-CM

## 2019-04-22 DIAGNOSIS — Z79899 Other long term (current) drug therapy: Secondary | ICD-10-CM | POA: Diagnosis not present

## 2019-04-22 DIAGNOSIS — Z923 Personal history of irradiation: Secondary | ICD-10-CM | POA: Diagnosis not present

## 2019-04-22 DIAGNOSIS — Z170 Estrogen receptor positive status [ER+]: Secondary | ICD-10-CM

## 2019-04-22 DIAGNOSIS — D72819 Decreased white blood cell count, unspecified: Secondary | ICD-10-CM | POA: Insufficient documentation

## 2019-04-22 LAB — CMP (CANCER CENTER ONLY)
ALT: 110 U/L — ABNORMAL HIGH (ref 0–44)
AST: 67 U/L — ABNORMAL HIGH (ref 15–41)
Albumin: 4.1 g/dL (ref 3.5–5.0)
Alkaline Phosphatase: 80 U/L (ref 38–126)
Anion gap: 9 (ref 5–15)
BUN: 13 mg/dL (ref 6–20)
CO2: 26 mmol/L (ref 22–32)
Calcium: 9.1 mg/dL (ref 8.9–10.3)
Chloride: 106 mmol/L (ref 98–111)
Creatinine: 0.8 mg/dL (ref 0.44–1.00)
GFR, Est AFR Am: >60 mL/min (ref >60)
GFR, Estimated: >60 mL/min (ref >60)
Glucose, Bld: 115 mg/dL — ABNORMAL HIGH (ref 70–99)
Potassium: 3.7 mmol/L (ref 3.5–5.1)
Sodium: 141 mmol/L (ref 135–145)
Total Bilirubin: 0.4 mg/dL (ref 0.3–1.2)
Total Protein: 7.4 g/dL (ref 6.5–8.1)

## 2019-04-22 LAB — CBC WITH DIFFERENTIAL (CANCER CENTER ONLY)
Abs Immature Granulocytes: 0.02 10*3/uL (ref 0.00–0.07)
Basophils Absolute: 0 10*3/uL (ref 0.0–0.1)
Basophils Relative: 1 %
Eosinophils Absolute: 0 10*3/uL (ref 0.0–0.5)
Eosinophils Relative: 1 %
HCT: 32.7 % — ABNORMAL LOW (ref 36.0–46.0)
Hemoglobin: 11.6 g/dL — ABNORMAL LOW (ref 12.0–15.0)
Immature Granulocytes: 1 %
Lymphocytes Relative: 19 %
Lymphs Abs: 0.6 10*3/uL — ABNORMAL LOW (ref 0.7–4.0)
MCH: 33.5 pg (ref 26.0–34.0)
MCHC: 35.5 g/dL (ref 30.0–36.0)
MCV: 94.5 fL (ref 80.0–100.0)
Monocytes Absolute: 0.4 10*3/uL (ref 0.1–1.0)
Monocytes Relative: 13 %
Neutro Abs: 2.2 10*3/uL (ref 1.7–7.7)
Neutrophils Relative %: 65 %
Platelet Count: 212 10*3/uL (ref 150–400)
RBC: 3.46 MIL/uL — ABNORMAL LOW (ref 3.87–5.11)
RDW: 16.4 % — ABNORMAL HIGH (ref 11.5–15.5)
WBC Count: 3.3 10*3/uL — ABNORMAL LOW (ref 4.0–10.5)
nRBC: 0 % (ref 0.0–0.2)

## 2019-04-22 NOTE — Telephone Encounter (Signed)
Pt called triage line asking if it was ok to come to lab/f/u appt with Gudena today as scheduled after having her Covid-19 shot this am.  Pt instructed to come to appt at scheduled time but to call back if she starts having any side effects from the shot beforehand.  Pt verbalized understanding of instructions and of appt times today.

## 2019-04-22 NOTE — Assessment & Plan Note (Signed)
05/13/2018:Evaluation of 5 months of thickening of the left breast upper outer quadrant. 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 Several lymph nodes were identified by ultrasound. T2 N1 stage IIb clinical stage  Treatment plan: 1.Neoadjuvant chemotherapy with dose dense Adriamycin and Cytoxan followed by Taxol weekly x12completed 10/08/2018 2.Breast conserving surgery with targeted node dissection9/03/2018 3.Adjuvant radiation therapy10/26/2020-02/10/2019 4.Followed by adjuvant antiestrogen therapy(hysterectomy and bilateral salpingo-oophorectomy 02/18/2019) ----------------------------------------------------------------------------------------------------------------------------------------------------------------- 11/05/2018:Left lumpectomy [name]: residual invasive carcinoma s/p neoadjuvant treatment, HER-2 - (0), ER+ 100%, PR+ 60%, Ki67 10%, involved inferior and medial margins, and 8/9 left axillary lymph nodes positive for carcinoma. Resection of margins 11/18/2018: Residual cancer was identified in the left inferior margin and the final margins are clear.  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  PET-CT 03/21/2019 Current Treatment:Ibrance along with letrozolestarted 02/24/19, today cycle 2-day 1 Ibrance Toxicities:Denies any nausea vomiting denies any fatigue.  PET CT scan 03/20/2018: Right sided cervical lymph nodes which could be reactive metastatic.  Otherwise no other findings of metastatic disease.  Left upper lobe hypermetabolic airspace  disease is radiation related.  Nonspecific right-sided pulmonary nodules below PET resolution  Ibrance toxicities: Denies any adverse effects to [place]. Leukopenia: ANC 1.3 so we will continue the dosage at 125 mg.  Return to clinic in 4 weeks for toxicity check and follow-up

## 2019-04-23 ENCOUNTER — Telehealth: Payer: Self-pay | Admitting: Hematology and Oncology

## 2019-04-23 NOTE — Telephone Encounter (Signed)
I talk with patient regarding schedule  

## 2019-04-29 ENCOUNTER — Other Ambulatory Visit: Payer: BC Managed Care – PPO

## 2019-04-30 ENCOUNTER — Other Ambulatory Visit: Payer: Self-pay

## 2019-04-30 ENCOUNTER — Inpatient Hospital Stay: Payer: BC Managed Care – PPO

## 2019-04-30 DIAGNOSIS — Z923 Personal history of irradiation: Secondary | ICD-10-CM | POA: Diagnosis not present

## 2019-04-30 DIAGNOSIS — C787 Secondary malignant neoplasm of liver and intrahepatic bile duct: Secondary | ICD-10-CM | POA: Diagnosis not present

## 2019-04-30 DIAGNOSIS — Z170 Estrogen receptor positive status [ER+]: Secondary | ICD-10-CM | POA: Diagnosis not present

## 2019-04-30 DIAGNOSIS — D72819 Decreased white blood cell count, unspecified: Secondary | ICD-10-CM | POA: Diagnosis not present

## 2019-04-30 DIAGNOSIS — C50412 Malignant neoplasm of upper-outer quadrant of left female breast: Secondary | ICD-10-CM | POA: Diagnosis not present

## 2019-04-30 DIAGNOSIS — Z9221 Personal history of antineoplastic chemotherapy: Secondary | ICD-10-CM | POA: Diagnosis not present

## 2019-04-30 DIAGNOSIS — Z79899 Other long term (current) drug therapy: Secondary | ICD-10-CM | POA: Diagnosis not present

## 2019-04-30 LAB — CBC WITH DIFFERENTIAL (CANCER CENTER ONLY)
Abs Immature Granulocytes: 0.01 10*3/uL (ref 0.00–0.07)
Basophils Absolute: 0.1 10*3/uL (ref 0.0–0.1)
Basophils Relative: 2 %
Eosinophils Absolute: 0 10*3/uL (ref 0.0–0.5)
Eosinophils Relative: 1 %
HCT: 34.4 % — ABNORMAL LOW (ref 36.0–46.0)
Hemoglobin: 12.1 g/dL (ref 12.0–15.0)
Immature Granulocytes: 0 %
Lymphocytes Relative: 23 %
Lymphs Abs: 0.7 10*3/uL (ref 0.7–4.0)
MCH: 34.4 pg — ABNORMAL HIGH (ref 26.0–34.0)
MCHC: 35.2 g/dL (ref 30.0–36.0)
MCV: 97.7 fL (ref 80.0–100.0)
Monocytes Absolute: 0.2 10*3/uL (ref 0.1–1.0)
Monocytes Relative: 6 %
Neutro Abs: 2 10*3/uL (ref 1.7–7.7)
Neutrophils Relative %: 68 %
Platelet Count: 324 10*3/uL (ref 150–400)
RBC: 3.52 MIL/uL — ABNORMAL LOW (ref 3.87–5.11)
RDW: 15.8 % — ABNORMAL HIGH (ref 11.5–15.5)
WBC Count: 2.9 10*3/uL — ABNORMAL LOW (ref 4.0–10.5)
nRBC: 0 % (ref 0.0–0.2)

## 2019-04-30 LAB — CMP (CANCER CENTER ONLY)
ALT: 56 U/L — ABNORMAL HIGH (ref 0–44)
AST: 29 U/L (ref 15–41)
Albumin: 4.3 g/dL (ref 3.5–5.0)
Alkaline Phosphatase: 73 U/L (ref 38–126)
Anion gap: 10 (ref 5–15)
BUN: 10 mg/dL (ref 6–20)
CO2: 24 mmol/L (ref 22–32)
Calcium: 9.3 mg/dL (ref 8.9–10.3)
Chloride: 103 mmol/L (ref 98–111)
Creatinine: 0.75 mg/dL (ref 0.44–1.00)
GFR, Est AFR Am: >60 mL/min (ref >60)
GFR, Estimated: >60 mL/min (ref >60)
Glucose, Bld: 98 mg/dL (ref 70–99)
Potassium: 3.9 mmol/L (ref 3.5–5.1)
Sodium: 137 mmol/L (ref 135–145)
Total Bilirubin: 0.8 mg/dL (ref 0.3–1.2)
Total Protein: 7.8 g/dL (ref 6.5–8.1)

## 2019-05-13 ENCOUNTER — Inpatient Hospital Stay: Payer: BC Managed Care – PPO | Admitting: Adult Health

## 2019-05-20 ENCOUNTER — Inpatient Hospital Stay: Payer: BC Managed Care – PPO

## 2019-05-20 ENCOUNTER — Inpatient Hospital Stay: Payer: BC Managed Care – PPO | Admitting: Adult Health

## 2019-05-26 ENCOUNTER — Other Ambulatory Visit: Payer: Self-pay

## 2019-05-26 DIAGNOSIS — C50412 Malignant neoplasm of upper-outer quadrant of left female breast: Secondary | ICD-10-CM

## 2019-05-26 DIAGNOSIS — Z170 Estrogen receptor positive status [ER+]: Secondary | ICD-10-CM

## 2019-05-26 NOTE — Progress Notes (Signed)
Patient Care Team: [name], MD as PCP - General (Family Medicine) [name], MD as Consulting Physician (Hematology and Oncology) [name], MD as Consulting Physician (Radiation Oncology) [name], MD as Consulting Physician (General Surgery)  DIAGNOSIS:    ICD-10-CM   1. Malignant neoplasm of upper-outer quadrant of left breast in female, estrogen receptor positive [facility])  C50.412 CT Chest W Contrast   Z17.0 CBC with Differential ([place] Only)    [place] ([place] only)    SUMMARY OF ONCOLOGIC HISTORY: Oncology History  Malignant neoplasm of upper-outer quadrant of left breast in female, estrogen receptor positive ([place])  05/13/2018 Initial Diagnosis   Evaluation of 5 months of thickening of the left breast upper outer quadrant.  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  Several lymph nodes were identified by ultrasound.  T2 N1 stage IIb clinical stage   05/16/2018 Cancer Staging   Staging form: Breast, AJCC 8th Edition - Clinical stage from 05/16/2018: Stage IIB (cT2, cN1, cM0, G3, ER+, PR+, HER2-) - Signed by [name], MD on 05/16/2018   05/28/2018 - 10/08/2018 Chemotherapy   DOXOrubicin (ADRIAMYCIN) chemo injection 128 mg, 60 mg/m2 = 128 mg, Intravenous,  Once, 4 of 4 cycles. Administration: 128 mg (05/28/2018), 128 mg (06/11/2018), 128 mg (06/25/2018), 128 mg (07/09/2018)  palonosetron (ALOXI) injection 0.25 mg, 0.25 mg, Intravenous,  Once, 9 of 9 cycles. Administration: 0.25 mg (05/28/2018), 0.25 mg (06/11/2018), 0.25 mg (06/25/2018), 0.25 mg (07/09/2018), 0.25 mg (09/10/2018), 0.25 mg (09/17/2018), 0.25 mg (09/24/2018), 0.25 mg (10/01/2018), 0.25 mg (10/08/2018)  pegfilgrastim (NEULASTA ONPRO KIT) injection 6 mg, 6  mg, Subcutaneous, Once, 4 of 4 cycles. Administration: 6 mg (05/28/2018), 6 mg (06/11/2018), 6 mg (06/25/2018), 6 mg (07/09/2018)  cyclophosphamide (CYTOXAN) 1,280 mg in sodium chloride 0.9 % 250 mL chemo infusion, 600 mg/m2 = 1,280 mg, Intravenous,  Once, 4 of 4 cycles. Administration: 1,280 mg (05/28/2018), 1,280 mg (06/11/2018), 1,280 mg (06/25/2018), 1,280 mg (07/09/2018)  PACLitaxel (TAXOL) 168 mg in sodium chloride 0.9 % 250 mL chemo infusion (</= 48m/m2), 80 mg/m2 = 168 mg, Intravenous,  Once, 12 of 12 cycles. Administration: 168 mg (07/23/2018), 168 mg (07/30/2018), 168 mg (08/06/2018), 168 mg (08/13/2018), 168 mg (08/20/2018), 168 mg (08/27/2018), 168 mg (09/03/2018), 168 mg (09/10/2018), 168 mg (09/17/2018), 168 mg (09/24/2018), 168 mg (10/01/2018), 168 mg (10/08/2018)  fosaprepitant (EMEND) 150 mg, dexamethasone (DECADRON) 12 mg in sodium chloride 0.9 % 145 mL IVPB, , Intravenous,  Once, 4 of 4 cycles. Administration:  (05/28/2018),  (06/11/2018),  (06/25/2018),  (07/09/2018)    08/16/2018 Genetic Testing   Negative genetic testing on the multi-cancer panel.  The Multi-Gene Panel offered by [facility] includes sequencing and/or deletion duplication testing of the following 85 genes: AIP, ALK, APC, ATM, AXIN2,BAP1,  BARD1, BLM, BMPR1A, BRCA1, BRCA2, BRIP1, CASR, CDC73, CDH1, CDK4, CDKN1B, CDKN1C, CDKN2A (p14ARF), CDKN2A (p16INK4a), CEBPA, CHEK2, CTNNA1, DICER1, DIS3L2, EGFR (c.2369C>T, p.Thr790Met variant only), EPCAM (Deletion/duplication testing only), FH, FLCN, GATA2, GPC3, GREM1 (Promoter region deletion/duplication testing only), HOXB13 (c.251G>A, p.Gly84Glu), HRAS, KIT, MAX, MEN1, MET, MITF (c.952G>A, p.Glu318Lys variant only), MLH1, MSH2, MSH3, MSH6, MUTYH, NBN, NF1, NF2, NTHL1, PALB2, PDGFRA, PHOX2B, PMS2, POLD1, POLE, POT1, PRKAR1A, PTCH1, PTEN, RAD50, RAD51C, RAD51D, RB1, RECQL4, RET, RNF43, RUNX1, SDHAF2, SDHA (sequence changes only), SDHB, SDHC, SDHD, SMAD4,  SMARCA4, SMARCB1, SMARCE1, STK11, SUFU, TERC, TERT, TMEM127,  TP53, TSC1, TSC2, VHL, WRN and WT1.  The report date is August 16, 2018.   10/07/2018 Breast MRI   Significant improvement of left breast malignancy.  Non-mass enhancement 3.7 cm.  Significant improvement in the left axillary lymph nodes previously there were 6 nodes and currently there was a single lymph node with mild cortical thickening.   11/05/2018 Surgery   Left lumpectomy [name] [phone]): residual invasive carcinoma s/p neoadjuvant treatment, HER-2 - (0), ER+ 100%, PR+ 60%, Ki67 10%, involved inferior and medial margins, and 8/9 left axillary lymph nodes positive for carcinoma.   11/05/2018 Cancer Staging   Staging form: Breast, AJCC 8th Edition - Pathologic stage from 11/05/2018: No Stage Recommended (ypT2, pN2a, cM0, ER+, PR+, HER2-) - Signed by [name], NP on 11/27/2018   11/18/2018 Surgery   Re-excision of left lumpectomy [name] [id number]): residual invasive carcinoma, clear margins.   12/30/2018 - 02/14/2019 Radiation Therapy   The patient initially received a dose of 50.4 Gy in 28 fractions to the breast using whole-breast tangent fields. This was delivered using a 3-D conformal technique. The patient also received a dose of 50.4 Gy in 28 fraction to the left axilla. The pt received a boost delivering an additional 12 Gy in 6 fractions using a electron boost with 44mV electrons. The total dose was 112.8 Gy.    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 with above-mentioned history of metastatic breast cancer currently on treatment with Ibrance and letrozole.She  presents to the clinic todayfor a toxicity check.  She is doing quite well with exception of mild fatigue.  She is working full-time.  She denies any other side effects from [name]  ALLERGIES:  has No Known Allergies.  MEDICATIONS:  Current Outpatient Medications  Medication Sig Dispense Refill  . ALPRAZolam (XANAX) 0.5 MG tablet Take 1 tablet (0.5 mg total) by mouth at bedtime as needed for anxiety. (Patient taking differently: Take 0.5 mg by mouth at bedtime as needed for anxiety. ) 60 tablet 3  . anastrozole (ARIMIDEX) 1 MG tablet Take 1 tablet (1 mg total) by mouth daily. (Patient not taking: Reported on 02/13/2019) 90 tablet 3  . calcium carbonate (TUMS - DOSED IN MG ELEMENTAL CALCIUM) 500 MG chewable tablet Chew 1 tablet by mouth as needed for indigestion or heartburn.     .Marland KitchenLORazepam (ATIVAN) 0.5 MG tablet TAKE 1 TABLET(0.5 MG) BY MOUTH AT BEDTIME AS NEEDED FOR NAUSEA OR VOMITING 30 tablet 0  . palbociclib (IBRANCE) 125 MG tablet Take 1 tablet (125 mg total) by mouth daily. Take for 21 days on, 7 days off, repeat every 28 days. 21 tablet 2   No current facility-administered medications for this visit.    PHYSICAL EXAMINATION: ECOG PERFORMANCE STATUS: 1 - Symptomatic but completely ambulatory  Vitals:   05/27/19 1430  BP: (!) 131/102  Pulse: 92  Resp: 17  Temp: 97.9 F (36.6 C)  SpO2: 100%   Filed Weights   05/27/19 1430  Weight: 207 lb (93.9 kg)    LABORATORY DATA:  I have reviewed the data as listed CMP Latest Ref Rng &  Units 04/30/2019 04/22/2019 03/24/2019  Glucose 70 - 99 mg/dL 98 115(H) 95  BUN 6 - 20 mg/dL _0 Creatinine 0.44 - 1.00 mg/dL 0.75 0.80 0.78  Sodium 135 - 145 mmol/L 137 141 138  Potassium 3.5 - 5.1 mmol/L 3.9 3.7 3.7  Chloride 98 - 111 mmol/L 103 106 103  CO2 22 - 32 mmol/L _1 Calcium 8.9 - 10.3 mg/dL 9.3 9.1 8.8(L)  Total Protein 6.5 - 8.1 g/dL 7.8 7.4 7.6  Total Bilirubin 0.3 - 1.2 mg/dL 0.8 0.4 0.3  Alkaline Phos 38 - 126 U/L 73 80  95  AST 15 - 41 U/L 29 67(H) 35  ALT 0 - 44 U/L 56(H) 110(H) 38    Lab Results  Component Value Date   WBC 3.0 (L) 05/27/2019   HGB 12.1 05/27/2019   HCT 34.1 (L) 05/27/2019   MCV 101.2 (H) 05/27/2019   PLT 369 05/27/2019   NEUTROABS 2.1 05/27/2019    ASSESSMENT & PLAN:  Malignant neoplasm of upper-outer quadrant of left breast in female, estrogen receptor positive ([place]) 05/13/2018:Evaluation of 5 months of thickening of the left breast upper outer quadrant. 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 Several lymph nodes were identified by ultrasound. T2 N1 stage IIb clinical stage  Treatment plan: 1.Neoadjuvant chemotherapy with dose dense Adriamycin and Cytoxan followed by Taxol weekly x12completed 10/08/2018 2.Breast conserving surgery with targeted node dissection9/03/2018 3.Adjuvant radiation therapy10/26/2020-02/10/2019 4.Followed by adjuvant antiestrogen therapy(hysterectomy and bilateral salpingo-oophorectomy 02/18/2019) ----------------------------------------------------------------------------------------------------------------------------------------------------------------- 11/05/2018:Left lumpectomy [name]: residual invasive carcinoma s/p neoadjuvant treatment, HER-2 - (0), ER+ 100%, PR+ 60%, Ki67 10%, involved inferior and medial margins, and 8/9 left axillary lymph nodes positive for carcinoma. Resection of margins 11/18/2018: Residual cancer was identified in the left inferior margin and the final margins are clear.  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  PET-CT 03/21/2019 Current Treatment:Ibrance along with letrozolestarted 02/24/19, today cycle 4-day 1 Ibrance Toxicities:Denies any nausea vomiting denies any fatigue.  PET CT scan 03/20/2018: Right sided cervical lymph nodes which could be reactive metastatic. Otherwise no other findings of metastatic disease. Left upper lobe hypermetabolic airspace disease is radiation related. Nonspecific right-sided pulmonary nodules below PET resolution  Ibrance toxicities:Denies any adverse effects to [place]. Leukopenia: ANC 2.2 so we will continue the dosage at 125 mg.  Elevated AST and ALT: Most likely related to [place].    Monitoring closely Scans will be obtained for restaging on 06/19/2019.  We will only do CT chest because most of her disease activity is only in the chest.  Return to clinic after the scans for follow-up with labs.    Orders Placed This Encounter  Procedures  . CT Chest W Contrast    Standing Status:   Future    Standing Expiration Date:   05/26/2020    Order Specific Question:   ** REASON FOR EXAM (FREE TEXT)    Answer:   Metastatic breast cancedr restaging lung nodules    Order Specific Question:   If indicated for the ordered procedure, I authorize the administration of contrast media per Radiology protocol    Answer:   Yes    Order Specific Question:  Is patient pregnant?    Answer:   No    Order Specific Question:   Preferred imaging location?    Answer:   [facility]    Order Specific Question:   Release to patient    Answer:   Immediate    Order Specific Question:   Radiology Contrast Protocol - do NOT remove file path    Answer:   [email or web address]  . CBC with Differential ([place] Only)    Standing Status:   Future    Standing Expiration Date:   05/26/2020  . CMP ([place] only)    Standing Status:   Future    Standing Expiration Date:   05/26/2020   The patient has a good  understanding of the overall plan. she agrees with it. she will call with any problems that may develop before the next visit here.  Total time spent: 30 mins including face to face time and time spent for planning, charting and coordination of care  [name], MD 05/27/2019  I, Cloyde Reams Dorshimer, am acting as scribe for Dr. [name].  I have reviewed the above documentation for accuracy and completeness, and I agree with the above.

## 2019-05-27 ENCOUNTER — Inpatient Hospital Stay: Payer: BC Managed Care – PPO | Admitting: Hematology and Oncology

## 2019-05-27 ENCOUNTER — Inpatient Hospital Stay: Payer: BC Managed Care – PPO | Attending: Hematology and Oncology

## 2019-05-27 ENCOUNTER — Other Ambulatory Visit: Payer: Self-pay

## 2019-05-27 DIAGNOSIS — D72819 Decreased white blood cell count, unspecified: Secondary | ICD-10-CM | POA: Insufficient documentation

## 2019-05-27 DIAGNOSIS — Z170 Estrogen receptor positive status [ER+]: Secondary | ICD-10-CM

## 2019-05-27 DIAGNOSIS — R7401 Elevation of levels of liver transaminase levels: Secondary | ICD-10-CM | POA: Insufficient documentation

## 2019-05-27 DIAGNOSIS — C50412 Malignant neoplasm of upper-outer quadrant of left female breast: Secondary | ICD-10-CM

## 2019-05-27 DIAGNOSIS — Z79899 Other long term (current) drug therapy: Secondary | ICD-10-CM | POA: Insufficient documentation

## 2019-05-27 DIAGNOSIS — C787 Secondary malignant neoplasm of liver and intrahepatic bile duct: Secondary | ICD-10-CM | POA: Insufficient documentation

## 2019-05-27 DIAGNOSIS — R5383 Other fatigue: Secondary | ICD-10-CM | POA: Insufficient documentation

## 2019-05-27 LAB — CBC WITH DIFFERENTIAL (CANCER CENTER ONLY)
Abs Immature Granulocytes: 0.01 10*3/uL (ref 0.00–0.07)
Basophils Absolute: 0.1 10*3/uL (ref 0.0–0.1)
Basophils Relative: 2 %
Eosinophils Absolute: 0 10*3/uL (ref 0.0–0.5)
Eosinophils Relative: 1 %
HCT: 34.1 % — ABNORMAL LOW (ref 36.0–46.0)
Hemoglobin: 12.1 g/dL (ref 12.0–15.0)
Immature Granulocytes: 0 %
Lymphocytes Relative: 22 %
Lymphs Abs: 0.7 10*3/uL (ref 0.7–4.0)
MCH: 35.9 pg — ABNORMAL HIGH (ref 26.0–34.0)
MCHC: 35.5 g/dL (ref 30.0–36.0)
MCV: 101.2 fL — ABNORMAL HIGH (ref 80.0–100.0)
Monocytes Absolute: 0.2 10*3/uL (ref 0.1–1.0)
Monocytes Relative: 5 %
Neutro Abs: 2.1 10*3/uL (ref 1.7–7.7)
Neutrophils Relative %: 70 %
Platelet Count: 369 10*3/uL (ref 150–400)
RBC: 3.37 MIL/uL — ABNORMAL LOW (ref 3.87–5.11)
RDW: 14.5 % (ref 11.5–15.5)
WBC Count: 3 10*3/uL — ABNORMAL LOW (ref 4.0–10.5)
nRBC: 0 % (ref 0.0–0.2)

## 2019-05-27 LAB — CMP (CANCER CENTER ONLY)
ALT: 23 U/L (ref 0–44)
AST: 20 U/L (ref 15–41)
Albumin: 4 g/dL (ref 3.5–5.0)
Alkaline Phosphatase: 70 U/L (ref 38–126)
Anion gap: 11 (ref 5–15)
BUN: 9 mg/dL (ref 6–20)
CO2: 26 mmol/L (ref 22–32)
Calcium: 9.3 mg/dL (ref 8.9–10.3)
Chloride: 103 mmol/L (ref 98–111)
Creatinine: 0.81 mg/dL (ref 0.44–1.00)
GFR, Est AFR Am: >60 mL/min (ref >60)
GFR, Estimated: >60 mL/min (ref >60)
Glucose, Bld: 101 mg/dL — ABNORMAL HIGH (ref 70–99)
Potassium: 3.7 mmol/L (ref 3.5–5.1)
Sodium: 140 mmol/L (ref 135–145)
Total Bilirubin: 0.4 mg/dL (ref 0.3–1.2)
Total Protein: 7.3 g/dL (ref 6.5–8.1)

## 2019-05-27 NOTE — Assessment & Plan Note (Signed)
05/13/2018:Evaluation of 5 months of thickening of the left breast upper outer quadrant. 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 Several lymph nodes were identified by ultrasound. T2 N1 stage IIb clinical stage  Treatment plan: 1.Neoadjuvant chemotherapy with dose dense Adriamycin and Cytoxan followed by Taxol weekly x12completed 10/08/2018 2.Breast conserving surgery with targeted node dissection9/03/2018 3.Adjuvant radiation therapy10/26/2020-02/10/2019 4.Followed by adjuvant antiestrogen therapy(hysterectomy and bilateral salpingo-oophorectomy 02/18/2019) ----------------------------------------------------------------------------------------------------------------------------------------------------------------- 11/05/2018:Left lumpectomy [name]: residual invasive carcinoma s/p neoadjuvant treatment, HER-2 - (0), ER+ 100%, PR+ 60%, Ki67 10%, involved inferior and medial margins, and 8/9 left axillary lymph nodes positive for carcinoma. Resection of margins 11/18/2018: Residual cancer was identified in the left inferior margin and the final margins are clear.  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  PET-CT 03/21/2019 Current Treatment:Ibrance along with letrozolestarted 02/24/19, today cycle 4-day 1 Ibrance Toxicities:Denies any nausea vomiting denies any fatigue.  PET CT scan 03/20/2018: Right sided cervical lymph nodes which could be reactive metastatic. Otherwise no other findings of metastatic disease. Left upper lobe hypermetabolic airspace  disease is radiation related. Nonspecific right-sided pulmonary nodules below PET resolution  Ibrance toxicities:Denies any adverse effects to [place]. Leukopenia: ANC 2.2 so we will continue the dosage at 125 mg.  Elevated AST and ALT: Most likely related to [place].   Scans will be obtained for restaging.  Return to clinic in4weeks for toxicity check and follow-up

## 2019-05-28 ENCOUNTER — Other Ambulatory Visit: Payer: Self-pay | Admitting: Hematology and Oncology

## 2019-05-28 DIAGNOSIS — C50412 Malignant neoplasm of upper-outer quadrant of left female breast: Secondary | ICD-10-CM

## 2019-05-28 DIAGNOSIS — Z170 Estrogen receptor positive status [ER+]: Secondary | ICD-10-CM

## 2019-06-03 ENCOUNTER — Telehealth: Payer: Self-pay | Admitting: Hematology and Oncology

## 2019-06-03 DIAGNOSIS — J029 Acute pharyngitis, unspecified: Secondary | ICD-10-CM | POA: Diagnosis not present

## 2019-06-03 NOTE — Telephone Encounter (Signed)
Scheduled per los, called patient and left a voicemail. 

## 2019-06-18 ENCOUNTER — Telehealth: Payer: Self-pay | Admitting: Hematology and Oncology

## 2019-06-18 NOTE — Telephone Encounter (Signed)
R/s appt per 4/14 sch message - unable to reach pt . Left msg with appt date and time

## 2019-06-19 ENCOUNTER — Ambulatory Visit (HOSPITAL_COMMUNITY)
Admission: RE | Admit: 2019-06-19 | Discharge: 2019-06-19 | Disposition: A | Discharge status: Home / Self Care | Payer: BC Managed Care – PPO | Source: Ambulatory Visit | Attending: Hematology and Oncology | Admitting: Hematology and Oncology

## 2019-06-19 ENCOUNTER — Encounter (HOSPITAL_COMMUNITY): Payer: Self-pay

## 2019-06-19 ENCOUNTER — Other Ambulatory Visit: Payer: Self-pay

## 2019-06-19 DIAGNOSIS — C50412 Malignant neoplasm of upper-outer quadrant of left female breast: Secondary | ICD-10-CM | POA: Insufficient documentation

## 2019-06-19 DIAGNOSIS — C50919 Malignant neoplasm of unspecified site of unspecified female breast: Secondary | ICD-10-CM | POA: Diagnosis not present

## 2019-06-19 DIAGNOSIS — Z170 Estrogen receptor positive status [ER+]: Secondary | ICD-10-CM | POA: Insufficient documentation

## 2019-06-19 MED ORDER — IOHEXOL 300 MG/ML  SOLN
75.0000 mL | Freq: Once | INTRAMUSCULAR | Status: AC | PRN
  Administered 2019-06-19: 75 mL via INTRAVENOUS

## 2019-06-19 MED ORDER — SODIUM CHLORIDE (PF) 0.9 % IJ SOLN
INTRAMUSCULAR | Status: AC
  Filled 2019-06-19: qty 50

## 2019-06-20 ENCOUNTER — Telehealth: Payer: Self-pay | Admitting: Hematology and Oncology

## 2019-06-20 ENCOUNTER — Inpatient Hospital Stay: Payer: BC Managed Care – PPO | Admitting: Hematology and Oncology

## 2019-06-20 ENCOUNTER — Inpatient Hospital Stay: Payer: BC Managed Care – PPO

## 2019-06-20 NOTE — Telephone Encounter (Signed)
Rescheduled 04/19 appointment to 04/20 due to provider being on PAL. Called patient regarding scheduling change.

## 2019-06-23 ENCOUNTER — Ambulatory Visit: Payer: BC Managed Care – PPO | Admitting: Hematology and Oncology

## 2019-06-23 ENCOUNTER — Other Ambulatory Visit: Payer: BC Managed Care – PPO

## 2019-06-23 NOTE — Progress Notes (Signed)
Patient Care Team: [name], MD as PCP - General (Family Medicine) [name], MD as Consulting Physician (Hematology and Oncology) [name], MD as Consulting Physician (Radiation Oncology) [name], MD as Consulting Physician (General Surgery)  DIAGNOSIS:    ICD-10-CM   1. Malignant neoplasm of upper-outer quadrant of left breast in female, estrogen receptor positive ([place])  C50.412    Z17.0     SUMMARY OF ONCOLOGIC HISTORY: Oncology History  Malignant neoplasm of upper-outer quadrant of left breast in female, estrogen receptor positive ([place])  05/13/2018 Initial Diagnosis   Evaluation of 5 months of thickening of the left breast upper outer quadrant.  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  Several lymph nodes were identified by ultrasound.  T2 N1 stage IIb clinical stage   05/16/2018 Cancer Staging   Staging form: Breast, AJCC 8th Edition - Clinical stage from 05/16/2018: Stage IIB (cT2, cN1, cM0, G3, ER+, PR+, HER2-) - Signed by [name], MD on 05/16/2018   05/28/2018 - 10/08/2018 Chemotherapy   DOXOrubicin (ADRIAMYCIN) chemo injection 128 mg, 60 mg/m2 = 128 mg, Intravenous,  Once, 4 of 4 cycles. Administration: 128 mg (05/28/2018), 128 mg (06/11/2018), 128 mg (06/25/2018), 128 mg (07/09/2018)  palonosetron (ALOXI) injection 0.25 mg, 0.25 mg, Intravenous,  Once, 9 of 9 cycles. Administration: 0.25 mg (05/28/2018), 0.25 mg (06/11/2018), 0.25 mg (06/25/2018), 0.25 mg (07/09/2018), 0.25 mg (09/10/2018), 0.25 mg (09/17/2018), 0.25 mg (09/24/2018), 0.25 mg (10/01/2018), 0.25 mg (10/08/2018)  pegfilgrastim (NEULASTA ONPRO KIT) injection 6 mg, 6 mg, Subcutaneous, Once, 4 of 4 cycles. Administration: 6 mg (05/28/2018), 6 mg (06/11/2018), 6  mg (06/25/2018), 6 mg (07/09/2018)  cyclophosphamide (CYTOXAN) 1,280 mg in sodium chloride 0.9 % 250 mL chemo infusion, 600 mg/m2 = 1,280 mg, Intravenous,  Once, 4 of 4 cycles. Administration: 1,280 mg (05/28/2018), 1,280 mg (06/11/2018), 1,280 mg (06/25/2018), 1,280 mg (07/09/2018)  PACLitaxel (TAXOL) 168 mg in sodium chloride 0.9 % 250 mL chemo infusion (</= 54m/m2), 80 mg/m2 = 168 mg, Intravenous,  Once, 12 of 12 cycles. Administration: 168 mg (07/23/2018), 168 mg (07/30/2018), 168 mg (08/06/2018), 168 mg (08/13/2018), 168 mg (08/20/2018), 168 mg (08/27/2018), 168 mg (09/03/2018), 168 mg (09/10/2018), 168 mg (09/17/2018), 168 mg (09/24/2018), 168 mg (10/01/2018), 168 mg (10/08/2018)  fosaprepitant (EMEND) 150 mg, dexamethasone (DECADRON) 12 mg in sodium chloride 0.9 % 145 mL IVPB, , Intravenous,  Once, 4 of 4 cycles. Administration:  (05/28/2018),  (06/11/2018),  (06/25/2018),  (07/09/2018)    08/16/2018 Genetic Testing   Negative genetic testing on the multi-cancer panel.  The Multi-Gene Panel offered by [facility] includes sequencing and/or deletion duplication testing of the following 85 genes: AIP, ALK, APC, ATM, AXIN2,BAP1,  BARD1, BLM, BMPR1A, BRCA1, BRCA2, BRIP1, CASR, CDC73, CDH1, CDK4, CDKN1B, CDKN1C, CDKN2A (p14ARF), CDKN2A (p16INK4a), CEBPA, CHEK2, CTNNA1, DICER1, DIS3L2, EGFR (c.2369C>T, p.Thr790Met variant only), EPCAM (Deletion/duplication testing only), FH, FLCN, GATA2, GPC3, GREM1 (Promoter region deletion/duplication testing only), HOXB13 (c.251G>A, p.Gly84Glu), HRAS, KIT, MAX, MEN1, MET, MITF (c.952G>A, p.Glu318Lys variant only), MLH1, MSH2, MSH3, MSH6, MUTYH, NBN, NF1, NF2, NTHL1, PALB2, PDGFRA, PHOX2B, PMS2, POLD1, POLE, POT1, PRKAR1A, PTCH1, PTEN, RAD50, RAD51C, RAD51D, RB1, RECQL4, RET, RNF43, RUNX1, SDHAF2, SDHA (sequence changes only), SDHB, SDHC, SDHD, SMAD4, SMARCA4, SMARCB1, SMARCE1, STK11, SUFU, TERC, TERT, TMEM127, TP53, TSC1, TSC2, VHL, WRN and WT1.  The report date is August 16, 2018.   10/07/2018 Breast MRI    Significant improvement of left breast malignancy.  Non-mass enhancement 3.7 cm.  Significant improvement in the left axillary lymph nodes previously there were 6 nodes and currently there was a single lymph node with mild cortical thickening.   11/05/2018 Surgery   Left lumpectomy Bethany Hanna) [phone]): residual invasive carcinoma s/p neoadjuvant treatment, HER-2 - (0), ER+ 100%, PR+ 60%, Ki67 10%, involved inferior and medial margins, and 8/9 left axillary lymph nodes positive for carcinoma.   11/05/2018 Cancer Staging   Staging form: Breast, AJCC 8th Edition - Pathologic stage from 11/05/2018: No Stage Recommended (ypT2, pN2a, cM0, ER+, PR+, HER2-) - Signed by [name], NP on 11/27/2018   11/18/2018 Surgery   Re-excision of left lumpectomy Bethany Hanna) [phone]): residual invasive carcinoma, clear margins.   12/30/2018 - 02/14/2019 Radiation Therapy   The patient initially received a dose of 50.4 Gy in 28 fractions to the breast using whole-breast tangent fields. This was delivered using a 3-D conformal technique. The patient also received a dose of 50.4 Gy in 28 fraction to the left axilla. The pt received a boost delivering an additional 12 Gy in 6 fractions using a electron boost with 48mV electrons. The total dose was 112.8 Gy.    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 with above-mentioned history of metastatic breast cancer currently on treatment with Ibrance and letrozole.Chest CT on 06/19/19 showed no evidence of metastatic disease, with a stable right lower lobe pulmonary  nodule and liver enhancement favored to represent hepatic steatosis. She presents to the clinic todayfor a toxicity check and to review her scan.   ALLERGIES:  has No Known Allergies.  MEDICATIONS:  Current Outpatient Medications  Medication Sig Dispense Refill  . ALPRAZolam (XANAX) 0.5 MG tablet Take 1 tablet (0.5 mg total) by mouth at bedtime as needed for anxiety. (Patient taking differently: Take 0.5 mg by mouth at bedtime as needed for anxiety. ) 60 tablet 3  . anastrozole (ARIMIDEX) 1 MG tablet Take 1 tablet (1 mg total) by mouth daily. (Patient not taking: Reported on 02/13/2019) 90 tablet 3  . calcium carbonate (TUMS - DOSED IN MG ELEMENTAL CALCIUM) 500 MG chewable tablet Chew 1 tablet by mouth as needed for indigestion or heartburn.     .Leslee Home125 MG tablet TAKE 1 TABLET BY MOUTH ONCE DAILY WITH OR WITHOUT FOOD  FOR 21 DAYS, FOLLOWED BY 7  DAYS OFF, REPEATED EVERY 28 DAYS 21 tablet 2  . LORazepam (ATIVAN) 0.5 MG tablet TAKE 1 TABLET(0.5 MG) BY MOUTH AT BEDTIME AS NEEDED FOR NAUSEA OR VOMITING 30 tablet 0   No current facility-administered medications for this visit.    PHYSICAL EXAMINATION: ECOG PERFORMANCE STATUS: 1 - Symptomatic but completely ambulatory  There were no vitals filed for this visit. There were no vitals filed for this visit.  LABORATORY DATA:  I have reviewed the data as listed CMP Latest Ref Rng & Units 05/27/2019 04/30/2019 04/22/2019  Glucose 70 - 99 mg/dL 101(H) 98 115(H)  BUN 6 - 20 mg/dL '9 10 13  '$ Creatinine 0.44 - 1.00 mg/dL 0.81 0.75  0.80  Sodium 135 - 145 mmol/L 140 137 141  Potassium 3.5 - 5.1 mmol/L 3.7 3.9 3.7  Chloride 98 - 111 mmol/L 103 103 106  CO2 22 - 32 mmol/L '26 24 26  '$ Calcium 8.9 - 10.3 mg/dL 9.3 9.3 9.1  Total Protein 6.5 - 8.1 g/dL 7.3 7.8 7.4  Total Bilirubin 0.3 - 1.2 mg/dL 0.4 0.8 0.4  Alkaline Phos 38 - 126 U/L 70 73 80  AST 15 - 41 U/L 20 29 67(H)  ALT 0 - 44 U/L 23 56(H) 110(H)    Lab Results  Component Value Date   WBC  3.0 (L) 05/27/2019   HGB 12.1 05/27/2019   HCT 34.1 (L) 05/27/2019   MCV 101.2 (H) 05/27/2019   PLT 369 05/27/2019   NEUTROABS 2.1 05/27/2019    ASSESSMENT & PLAN:  Malignant neoplasm of upper-outer quadrant of left breast in female, estrogen receptor positive ([place]) 05/13/2018:Evaluation of 5 months of thickening of the left breast upper outer quadrant. 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 Several lymph nodes were identified by ultrasound. T2 N1 stage IIb clinical stage  Treatment plan: 1.Neoadjuvant chemotherapy with dose dense Adriamycin and Cytoxan followed by Taxol weekly x12completed 10/08/2018 2.Breast conserving surgery with targeted node dissection9/03/2018 3.Adjuvant radiation therapy10/26/2020-02/10/2019 4.Followed by adjuvant antiestrogen therapy(hysterectomy and bilateral salpingo-oophorectomy 02/18/2019) ----------------------------------------------------------------------------------------------------------------------------------------------------------------- 11/05/2018:Left lumpectomy Bethany Hanna): residual invasive carcinoma s/p neoadjuvant treatment, HER-2 - (0), ER+ 100%, PR+ 60%, Ki67 10%, involved inferior and medial margins, and 8/9 left axillary lymph nodes positive for carcinoma. Resection of margins 11/18/2018: Residual cancer was identified in the left inferior margin and the final margins are clear.  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  PET-CT 03/21/2019 Current Treatment:Ibrance along with letrozolestarted 02/24/19,  today cycle 4-day 1 Ibrance Toxicities:Denies any nausea vomiting denies any fatigue.  PET CT scan 03/20/2018: Right sided cervical lymph nodes which could be reactive metastatic. Otherwise no other findings of metastatic disease. Left upper lobe hypermetabolic airspace disease is radiation related. Nonspecific right-sided pulmonary nodules below PET resolution  Ibrance toxicities:Denies any adverse effects to [place]. Leukopenia: ANC2.2so we will continue the dosage at 125 mg.  Elevated AST and ALT: Most likely related to [place].   These levels have normalized. CT chest 06/19/2019: No acute process of metastatic disease in the chest.  Left upper lobe radiation change.  Right lower lobe 3 mm lung nodule.  Fatty liver. I recommended that she start exercising and eat less carbs.  Continue the current treatment plan with Ibrance and letrozole. Return to clinic in 3 months for labs and follow-up.  Scans will be in 6 months.    No orders of the defined types were placed in this encounter.  The patient has a good understanding of the overall plan. she agrees with it. she will call with any problems that may develop before the next visit here.  Total time spent: 30 mins including face to face time and time spent for planning, charting and coordination of care  [name], MD 06/24/2019  I, Cloyde Reams Dorshimer, am acting as scribe for Dr. [name].  I have reviewed the above documentation for accuracy and completeness, and I agree with the  above.

## 2019-06-24 ENCOUNTER — Inpatient Hospital Stay: Payer: BC Managed Care – PPO | Admitting: Hematology and Oncology

## 2019-06-24 ENCOUNTER — Other Ambulatory Visit: Payer: Self-pay

## 2019-06-24 ENCOUNTER — Inpatient Hospital Stay: Payer: BC Managed Care – PPO | Attending: Hematology and Oncology

## 2019-06-24 ENCOUNTER — Encounter: Payer: Self-pay | Admitting: Hematology and Oncology

## 2019-06-24 DIAGNOSIS — C773 Secondary and unspecified malignant neoplasm of axilla and upper limb lymph nodes: Secondary | ICD-10-CM | POA: Diagnosis not present

## 2019-06-24 DIAGNOSIS — C787 Secondary malignant neoplasm of liver and intrahepatic bile duct: Secondary | ICD-10-CM | POA: Insufficient documentation

## 2019-06-24 DIAGNOSIS — Z79899 Other long term (current) drug therapy: Secondary | ICD-10-CM | POA: Diagnosis not present

## 2019-06-24 DIAGNOSIS — Z170 Estrogen receptor positive status [ER+]: Secondary | ICD-10-CM | POA: Diagnosis not present

## 2019-06-24 DIAGNOSIS — D72819 Decreased white blood cell count, unspecified: Secondary | ICD-10-CM | POA: Insufficient documentation

## 2019-06-24 DIAGNOSIS — C50412 Malignant neoplasm of upper-outer quadrant of left female breast: Secondary | ICD-10-CM | POA: Insufficient documentation

## 2019-06-24 DIAGNOSIS — K760 Fatty (change of) liver, not elsewhere classified: Secondary | ICD-10-CM | POA: Diagnosis not present

## 2019-06-24 LAB — CBC WITH DIFFERENTIAL (CANCER CENTER ONLY)
Abs Immature Granulocytes: 0.01 10*3/uL (ref 0.00–0.07)
Basophils Absolute: 0.1 10*3/uL (ref 0.0–0.1)
Basophils Relative: 2 %
Eosinophils Absolute: 0.1 10*3/uL (ref 0.0–0.5)
Eosinophils Relative: 1 %
HCT: 34.2 % — ABNORMAL LOW (ref 36.0–46.0)
Hemoglobin: 12.1 g/dL (ref 12.0–15.0)
Immature Granulocytes: 0 %
Lymphocytes Relative: 17 %
Lymphs Abs: 0.6 10*3/uL — ABNORMAL LOW (ref 0.7–4.0)
MCH: 37.6 pg — ABNORMAL HIGH (ref 26.0–34.0)
MCHC: 35.4 g/dL (ref 30.0–36.0)
MCV: 106.2 fL — ABNORMAL HIGH (ref 80.0–100.0)
Monocytes Absolute: 0.2 10*3/uL (ref 0.1–1.0)
Monocytes Relative: 6 %
Neutro Abs: 2.8 10*3/uL (ref 1.7–7.7)
Neutrophils Relative %: 74 %
Platelet Count: 349 10*3/uL (ref 150–400)
RBC: 3.22 MIL/uL — ABNORMAL LOW (ref 3.87–5.11)
RDW: 12.6 % (ref 11.5–15.5)
WBC Count: 3.7 10*3/uL — ABNORMAL LOW (ref 4.0–10.5)
nRBC: 0 % (ref 0.0–0.2)

## 2019-06-24 LAB — CMP (CANCER CENTER ONLY)
ALT: 24 U/L (ref 0–44)
AST: 18 U/L (ref 15–41)
Albumin: 4 g/dL (ref 3.5–5.0)
Alkaline Phosphatase: 76 U/L (ref 38–126)
Anion gap: 11 (ref 5–15)
BUN: 10 mg/dL (ref 6–20)
CO2: 25 mmol/L (ref 22–32)
Calcium: 9.1 mg/dL (ref 8.9–10.3)
Chloride: 104 mmol/L (ref 98–111)
Creatinine: 0.85 mg/dL (ref 0.44–1.00)
GFR, Est AFR Am: >60 mL/min (ref >60)
GFR, Estimated: >60 mL/min (ref >60)
Glucose, Bld: 105 mg/dL — ABNORMAL HIGH (ref 70–99)
Potassium: 4.1 mmol/L (ref 3.5–5.1)
Sodium: 140 mmol/L (ref 135–145)
Total Bilirubin: 0.5 mg/dL (ref 0.3–1.2)
Total Protein: 7.4 g/dL (ref 6.5–8.1)

## 2019-06-24 NOTE — Assessment & Plan Note (Signed)
05/13/2018:Evaluation of 5 months of thickening of the left breast upper outer quadrant. 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 Several lymph nodes were identified by ultrasound. T2 N1 stage IIb clinical stage  Treatment plan: 1.Neoadjuvant chemotherapy with dose dense Adriamycin and Cytoxan followed by Taxol weekly x12completed 10/08/2018 2.Breast conserving surgery with targeted node dissection9/03/2018 3.Adjuvant radiation therapy10/26/2020-02/10/2019 4.Followed by adjuvant antiestrogen therapy(hysterectomy and bilateral salpingo-oophorectomy 02/18/2019) ----------------------------------------------------------------------------------------------------------------------------------------------------------------- 11/05/2018:Left lumpectomy [name]: residual invasive carcinoma s/p neoadjuvant treatment, HER-2 - (0), ER+ 100%, PR+ 60%, Ki67 10%, involved inferior and medial margins, and 8/9 left axillary lymph nodes positive for carcinoma. Resection of margins 11/18/2018: Residual cancer was identified in the left inferior margin and the final margins are clear.  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  PET-CT 03/21/2019 Current Treatment:Ibrance along with letrozolestarted 02/24/19, today cycle 4-day 1 Ibrance Toxicities:Denies any nausea vomiting denies any fatigue.  PET CT scan 03/20/2018: Right sided cervical lymph nodes which could be reactive metastatic. Otherwise no other findings of metastatic disease. Left upper lobe hypermetabolic airspace  disease is radiation related. Nonspecific right-sided pulmonary nodules below PET resolution  Ibrance toxicities:Denies any adverse effects to [place]. Leukopenia: ANC2.2so we will continue the dosage at 125 mg.  Elevated AST and ALT: Most likely related to [place].   Monitoring closely CT chest 06/19/2019: No acute process of metastatic disease in the chest.  Left upper lobe radiation change.  Right lower lobe 3 mm lung nodule.  Fatty liver.  Continue the current treatment plan with Ibrance and letrozole. Return to clinic in 3 months for labs and follow-up.

## 2019-07-09 ENCOUNTER — Other Ambulatory Visit: Payer: Self-pay | Admitting: *Deleted

## 2019-07-09 DIAGNOSIS — C50412 Malignant neoplasm of upper-outer quadrant of left female breast: Secondary | ICD-10-CM

## 2019-07-09 DIAGNOSIS — Z170 Estrogen receptor positive status [ER+]: Secondary | ICD-10-CM

## 2019-07-09 MED ORDER — ALPRAZOLAM 0.5 MG PO TABS: 0.5000 mg | ORAL_TABLET | Freq: Every evening | ORAL | 3 refills | Status: DC | PRN

## 2019-07-09 MED ORDER — LORAZEPAM 0.5 MG PO TABS: ORAL_TABLET | ORAL | 3 refills | Status: DC

## 2019-07-16 ENCOUNTER — Encounter: Payer: Self-pay | Admitting: Physician Assistant

## 2019-07-16 ENCOUNTER — Telehealth (INDEPENDENT_AMBULATORY_CARE_PROVIDER_SITE_OTHER): Payer: BC Managed Care – PPO | Admitting: Physician Assistant

## 2019-07-16 ENCOUNTER — Telehealth: Payer: Self-pay

## 2019-07-16 DIAGNOSIS — J029 Acute pharyngitis, unspecified: Secondary | ICD-10-CM | POA: Diagnosis not present

## 2019-07-16 MED ORDER — AMOXICILLIN 875 MG PO TABS: 875.0000 mg | ORAL_TABLET | Freq: Two times a day (BID) | ORAL | 0 refills | Status: DC

## 2019-07-16 MED ORDER — FLUTICASONE PROPIONATE 50 MCG/ACT NA SUSP: 2.0000 | Freq: Every day | NASAL | 2 refills | Status: DC

## 2019-07-16 NOTE — Progress Notes (Signed)
Virtual Visit via Video   I connected with Bethany Hanna on 07/16/19 at 11:30 AM EDT by a video enabled telemedicine application and verified that I am speaking with the correct person using two identifiers. Location patient: Home Location provider: [facility], Office Persons participating in the virtual visit: Brizeyda Indyah, [place] PA-C  I discussed the limitations of evaluation and management by telemedicine and the availability of in person appointments. The patient expressed understanding and agreed to proceed.  Subjective:   HPI:  Sore throat States that last week she was around her 86 yo nephew who had a cold. Other family members also got sick after seeing him. She has been having clear runny nose and sore throat. Coughed up a tonsil stone. L side of throat more red and swollen then the other side. Max temp 99.5. Slight dry cough.  Fully COVID vaccinated since Feb.  Hx of strep, this feels like this. She is now developing L ear pain.  Denies: chills, neck stiffness, weakness, inability to swallow, unable to maintain hydration.  ROS: See pertinent positives and negatives per HPI.  Patient Active Problem List   Diagnosis Date Noted  . Breast cancer, left breast ([place]) 11/05/2018  . Genetic testing 08/21/2018  . Family history of colon cancer   . Family history of melanoma   . Port-A-Cath in place 06/11/2018  . White coat syndrome without hypertension 05/22/2018  . Malignant neoplasm of upper-outer quadrant of left breast in female, estrogen receptor positive ([place]) 05/16/2018    Social History   Tobacco Use  . Smoking status: Never Smoker  . Smokeless tobacco: Never Used  Substance Use Topics  . Alcohol use: Yes    Alcohol/week: 2.0 - 3.0 standard drinks    Types: 2 - 3 Glasses of wine per week    Current Outpatient Medications:  .  ALPRAZolam (XANAX) 0.5 MG tablet, Take 1 tablet (0.5 mg total) by mouth at bedtime as needed for anxiety., Disp: 60  tablet, Rfl: 3 .  anastrozole (ARIMIDEX) 1 MG tablet, Take 1 tablet (1 mg total) by mouth daily., Disp: 90 tablet, Rfl: 3 .  Biotin 10 MG CAPS, Take by mouth., Disp: , Rfl:  .  calcium carbonate (TUMS - DOSED IN MG ELEMENTAL CALCIUM) 500 MG chewable tablet, Chew 1 tablet by mouth as needed for indigestion or heartburn. , Disp: , Rfl:  .  IBRANCE 125 MG tablet, TAKE 1 TABLET BY MOUTH ONCE DAILY WITH OR WITHOUT FOOD  FOR 21 DAYS, FOLLOWED BY 7  DAYS OFF, REPEATED EVERY 28 DAYS, Disp: 21 tablet, Rfl: 2 .  LORazepam (ATIVAN) 0.5 MG tablet, TAKE 1 TABLET(0.5 MG) BY MOUTH AT BEDTIME AS NEEDED FOR NAUSEA OR VOMITING, Disp: 30 tablet, Rfl: 3 .  Multiple Vitamins-Minerals (WOMENS MULTI VITAMIN & MINERAL) TABS, Take by mouth., Disp: , Rfl:  .  amoxicillin (AMOXIL) 875 MG tablet, Take 1 tablet (875 mg total) by mouth 2 (two) times daily., Disp: 20 tablet, Rfl: 0 .  fluticasone (FLONASE) 50 MCG/ACT nasal spray, Place 2 sprays into both nostrils daily., Disp: 16 g, Rfl: 2  No Known Allergies  Objective:   VITALS: Per patient if applicable, see vitals. GENERAL: Alert, appears well and in no acute distress. HEENT: Atraumatic, conjunctiva clear, no obvious abnormalities on inspection of external nose and ears. NECK: Normal movements of the head and neck. CARDIOPULMONARY: No increased WOB. Speaking in clear sentences. I:E ratio WNL.  MS: Moves all visible extremities without noticeable abnormality.  PSYCH: Pleasant and cooperative, well-groomed. Speech normal rate and rhythm. Affect is appropriate. Insight and judgement are appropriate. Attention is focused, linear, and appropriate.  NEURO: CN grossly intact. Oriented as arrived to appointment on time with no prompting. Moves both UE equally.  SKIN: No obvious lesions, wounds, erythema, or cyanosis noted on face or hands.  Assessment and Plan:   Bethany Hanna was seen today for sore throat.  Diagnoses and all orders for this visit:  Pharyngitis, unspecified  etiology  Other orders -     amoxicillin (AMOXIL) 875 MG tablet; Take 1 tablet (875 mg total) by mouth 2 (two) times daily. -     fluticasone (FLONASE) 50 MCG/ACT nasal spray; Place 2 sprays into both nostrils daily.   No red flags on discussion. Will empirically treat with oral amoxicillin and flonase. Worsening precautions advised. Follow-up if no improvement of symptoms.  . Reviewed expectations re: course of current medical issues. . Discussed self-management of symptoms. . Outlined signs and symptoms indicating need for more acute intervention. . Patient verbalized understanding and all questions were answered. Marland Kitchen Health Maintenance issues including appropriate healthy diet, exercise, and smoking avoidance were discussed with patient. . See orders for this visit as documented in the electronic medical record.  I discussed the assessment and treatment plan with the patient. The patient was provided an opportunity to ask questions and all were answered. The patient agreed with the plan and demonstrated an understanding of the instructions.   The patient was advised to call back or seek an in-person evaluation if the symptoms worsen or if the condition fails to improve as anticipated.   [place], [place] 07/16/2019

## 2019-07-16 NOTE — Telephone Encounter (Signed)
[facility] RECORD AccessNurse Patient Name: Bethany Hanna Gender: Female DOB: [birth year] Age: 39 Y 24 D Return Phone Number: [phone] (Primary), [phone] (Secondary) Address: [place] [address] Client [facility] [place] [facility] [place] Day Physician Garret Reddish- MD Contact Type Call Who Is Calling Patient / Member / Family / Caregiver Call Type Triage / Clinical Caller Name Shawndel Senesac Relationship To Patient Self Return Phone Number [phone] (Primary) Chief Complaint Sore Throat Reason for Call Symptomatic / Request for [place] states she has a sore throat , fever, ear ache, cold like symptoms. Translation No Nurse Assessment Nurse: Rock Nephew, RN, [name] Date/Time (Eastern Time): 07/16/2019 9:18:55 AM Confirm and document reason for call. If symptomatic, describe symptoms. ---Caller states she has a sore throat , fever, ear ache, and cold like symptoms. Has the patient had close contact with a person known or suspected to have the novel coronavirus illness OR traveled / lives in area with major community spread (including international travel) in the last 14 days from the onset of symptoms? * If Asymptomatic, screen for exposure and travel within the last 14 days. ---No Does the patient have any new or worsening symptoms? ---Yes Will a triage be completed? ---Yes Related visit to physician within the last 2 weeks? ---No Does the PT have any chronic conditions? (i.e. diabetes, asthma, this includes High risk factors for pregnancy, etc.) ---Yes List chronic conditions. ---breast cancer survivor Is the patient pregnant or possibly pregnant? (Ask all females between the ages of 81-55) ---No Is this a behavioral health or substance abuse call? ---No Guidelines Guideline Title Affirmed  Question Affirmed Notes Nurse Date/Time Eilene Ghazi Time) Sore Throat Earache also present Rock Nephew, RN, [name] 07/16/2019 9:22:13 AM Disp. Time (Eastern Time) Disposition Final UserPLEASE NOTE: All timestamps contained within this report are represented as [place] Standard Time. CONFIDENTIALTY NOTICE: This fax transmission is intended only for the addressee. It contains information that is legally privileged, confidential or otherwise protected from use or disclosure. If you are not the intended recipient, you are strictly prohibited from reviewing, disclosing, copying using or disseminating any of this information or taking any action in reliance on or regarding this information. If you have received this fax in error, please notify us immediately by telephone so that we can arrange for its return to [place]. Phone: [phone], Toll-Free: [phone], Fax: [phone] Page: 2 of 2 Call Id: [id number] 07/16/2019 9:26:53 AM See PCP within 24 Hours Yes Rock Nephew, RN, Gara Kroner Disagree/Comply Comply Caller Understands Yes PreDisposition Call Doctor Care Advice Given Per Guideline SEE PCP WITHIN 24 HOURS: * IF OFFICE WILL BE OPEN: You need to be seen within the next 24 hours. Call your doctor (or NP/PA) when the office opens and make an appointment. SORE THROAT: * Here are some simple things you can do to treat and reduce sore throat pain. * Sip warm chicken broth or apple juice. * Suck on hard candy or an over-the-counter throat lozenge. * Gargle with warm salt water four times a day. To make salt water, put 1/2 teaspoon of salt in 8 oz (240 ml) of warm water. SOFT DIET: * Eat a soft diet. * Cold drinks, popsicles, and milk shakes are especially good. Avoid citrus fruits. CALL BACK IF

## 2019-07-16 NOTE — Telephone Encounter (Signed)
Noted  

## 2019-07-22 ENCOUNTER — Encounter: Payer: Self-pay | Admitting: Hematology and Oncology

## 2019-08-22 ENCOUNTER — Other Ambulatory Visit: Payer: Self-pay | Admitting: Hematology and Oncology

## 2019-08-22 ENCOUNTER — Telehealth: Payer: Self-pay | Admitting: Hematology and Oncology

## 2019-08-22 DIAGNOSIS — C50412 Malignant neoplasm of upper-outer quadrant of left female breast: Secondary | ICD-10-CM

## 2019-08-22 NOTE — Telephone Encounter (Signed)
R/s appt per 6/18 sch message - unable to reach pt - left message with appt date and time

## 2019-08-28 ENCOUNTER — Encounter: Payer: Self-pay | Admitting: Hematology and Oncology

## 2019-08-28 DIAGNOSIS — Z853 Personal history of malignant neoplasm of breast: Secondary | ICD-10-CM | POA: Diagnosis not present

## 2019-08-28 DIAGNOSIS — R928 Other abnormal and inconclusive findings on diagnostic imaging of breast: Secondary | ICD-10-CM | POA: Diagnosis not present

## 2019-09-02 DIAGNOSIS — C50412 Malignant neoplasm of upper-outer quadrant of left female breast: Secondary | ICD-10-CM | POA: Diagnosis not present

## 2019-09-05 ENCOUNTER — Other Ambulatory Visit: Payer: Self-pay | Admitting: Adult Health

## 2019-09-09 ENCOUNTER — Telehealth: Payer: Self-pay

## 2019-09-09 ENCOUNTER — Telehealth: Payer: Self-pay | Admitting: Hematology and Oncology

## 2019-09-09 NOTE — Telephone Encounter (Signed)
Oral Oncology Patient Advocate Encounter  Received notification from [place] that prior authorization for Leslee Home is required.  PA submitted on CoverMyMeds Key [id number] Status is pending  Oral Oncology Clinic will continue to follow.  [place] Patient [place] Phone [phone] Fax [phone] 09/09/2019 8:23 AM

## 2019-09-09 NOTE — Telephone Encounter (Signed)
Called pt per 7/6 sch msg - no answer. Left message for patient to call back if reschedule still needed

## 2019-09-23 ENCOUNTER — Ambulatory Visit: Payer: BC Managed Care – PPO | Admitting: Hematology and Oncology

## 2019-09-23 ENCOUNTER — Other Ambulatory Visit: Payer: BC Managed Care – PPO

## 2019-09-29 NOTE — Progress Notes (Signed)
Patient Care Team: [name], MD as PCP - General (Family Medicine) [name], MD as Consulting Physician (Hematology and Oncology) [name], MD as Consulting Physician (Radiation Oncology) [name], MD as Consulting Physician (General Surgery)  DIAGNOSIS:    ICD-10-CM   1. Metastatic breast cancer ([place])  C50.919 NM PET Image Restag (PS) Skull Base To Thigh  2. Malignant neoplasm of upper-outer quadrant of left breast in female, estrogen receptor positive ([facility])  C50.412 NM PET Image Restag (PS) Skull Base To Thigh   Z17.0     SUMMARY OF ONCOLOGIC HISTORY: Oncology History  Malignant neoplasm of upper-outer quadrant of left breast in female, estrogen receptor positive ([place])  05/13/2018 Initial Diagnosis   Evaluation of 5 months of thickening of the left breast upper outer quadrant.  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  Several lymph nodes were identified by ultrasound.  T2 N1 stage IIb clinical stage   05/16/2018 Cancer Staging   Staging form: Breast, AJCC 8th Edition - Clinical stage from 05/16/2018: Stage IIB (cT2, cN1, cM0, G3, ER+, PR+, HER2-) - Signed by [name], MD on 05/16/2018   05/28/2018 - 10/08/2018 Chemotherapy   DOXOrubicin (ADRIAMYCIN) chemo injection 128 mg, 60 mg/m2 = 128 mg, Intravenous,  Once, 4 of 4 cycles. Administration: 128 mg (05/28/2018), 128 mg (06/11/2018), 128 mg (06/25/2018), 128 mg (07/09/2018)  palonosetron (ALOXI) injection 0.25 mg, 0.25 mg, Intravenous,  Once, 9 of 9 cycles. Administration: 0.25 mg (05/28/2018), 0.25 mg (06/11/2018), 0.25 mg (06/25/2018), 0.25 mg (07/09/2018), 0.25 mg (09/10/2018), 0.25 mg (09/17/2018), 0.25 mg (09/24/2018), 0.25 mg (10/01/2018), 0.25 mg  (10/08/2018)  pegfilgrastim (NEULASTA ONPRO KIT) injection 6 mg, 6 mg, Subcutaneous, Once, 4 of 4 cycles. Administration: 6 mg (05/28/2018), 6 mg (06/11/2018), 6 mg (06/25/2018), 6 mg (07/09/2018)  cyclophosphamide (CYTOXAN) 1,280 mg in sodium chloride 0.9 % 250 mL chemo infusion, 600 mg/m2 = 1,280 mg, Intravenous,  Once, 4 of 4 cycles. Administration: 1,280 mg (05/28/2018), 1,280 mg (06/11/2018), 1,280 mg (06/25/2018), 1,280 mg (07/09/2018)  PACLitaxel (TAXOL) 168 mg in sodium chloride 0.9 % 250 mL chemo infusion (</= 86m/m2), 80 mg/m2 = 168 mg, Intravenous,  Once, 12 of 12 cycles. Administration: 168 mg (07/23/2018), 168 mg (07/30/2018), 168 mg (08/06/2018), 168 mg (08/13/2018), 168 mg (08/20/2018), 168 mg (08/27/2018), 168 mg (09/03/2018), 168 mg (09/10/2018), 168 mg (09/17/2018), 168 mg (09/24/2018), 168 mg (10/01/2018), 168 mg (10/08/2018)  fosaprepitant (EMEND) 150 mg, dexamethasone (DECADRON) 12 mg in sodium chloride 0.9 % 145 mL IVPB, , Intravenous,  Once, 4 of 4 cycles. Administration:  (05/28/2018),  (06/11/2018),  (06/25/2018),  (07/09/2018)    08/16/2018 Genetic Testing   Negative genetic testing on the multi-cancer panel.  The Multi-Gene Panel offered by [facility] includes sequencing and/or deletion duplication testing of the following 85 genes: AIP, ALK, APC, ATM, AXIN2,BAP1,  BARD1, BLM, BMPR1A, BRCA1, BRCA2, BRIP1, CASR, CDC73, CDH1, CDK4, CDKN1B, CDKN1C, CDKN2A (p14ARF), CDKN2A (p16INK4a), CEBPA, CHEK2, CTNNA1, DICER1, DIS3L2, EGFR (c.2369C>T, p.Thr790Met variant only), EPCAM (Deletion/duplication testing only), FH, FLCN, GATA2, GPC3, GREM1 (Promoter region deletion/duplication testing only), HOXB13 (c.251G>A, p.Gly84Glu), HRAS, KIT, MAX, MEN1, MET, MITF (c.952G>A, p.Glu318Lys variant only), MLH1, MSH2, MSH3, MSH6, MUTYH, NBN, NF1, NF2, NTHL1, PALB2, PDGFRA, PHOX2B, PMS2, POLD1, POLE, POT1, PRKAR1A, PTCH1, PTEN, RAD50, RAD51C, RAD51D, RB1, RECQL4, RET, RNF43,  RUNX1, SDHAF2, SDHA (sequence changes only), SDHB, SDHC, SDHD,  SMAD4, SMARCA4, SMARCB1, SMARCE1, STK11, SUFU, TERC, TERT, TMEM127, TP53, TSC1, TSC2, VHL, WRN and WT1.  The report date is August 16, 2018.   10/07/2018 Breast MRI   Significant improvement of left breast malignancy.  Non-mass enhancement 3.7 cm.  Significant improvement in the left axillary lymph nodes previously there were 6 nodes and currently there was a single lymph node with mild cortical thickening.   11/05/2018 Surgery   Left lumpectomy [name] [phone]): residual invasive carcinoma s/p neoadjuvant treatment, HER-2 - (0), ER+ 100%, PR+ 60%, Ki67 10%, involved inferior and medial margins, and 8/9 left axillary lymph nodes positive for carcinoma.   11/05/2018 Cancer Staging   Staging form: Breast, AJCC 8th Edition - Pathologic stage from 11/05/2018: No Stage Recommended (ypT2, pN2a, cM0, ER+, PR+, HER2-) - Signed by [name], NP on 11/27/2018   11/18/2018 Surgery   Re-excision of left lumpectomy [name] [phone]): residual invasive carcinoma, clear margins.   12/30/2018 - 02/14/2019 Radiation Therapy   The patient initially received a dose of 50.4 Gy in 28 fractions to the breast using whole-breast tangent fields. This was delivered using a 3-D conformal technique. The patient also received a dose of 50.4 Gy in 28 fraction to the left axilla. The pt received a boost delivering an additional 12 Gy in 6 fractions using a electron boost with 53mV electrons. The total dose was 112.8 Gy.    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 with above-mentioned history of metastatic  breast cancer currently on treatment with Ibrance and letrozole.She presents to the clinic todayfor a toxicity check.    She is tolerating the treatment extremely well without any problems or concerns.  She spent last couple weeks beach vacation and had a good time.  She does not report any fatigue or nausea or vomiting from [place]  ALLERGIES:  has No Known Allergies.  MEDICATIONS:  Current Outpatient Medications  Medication Sig Dispense Refill  . ALPRAZolam (XANAX) 0.5 MG tablet Take 1 tablet (0.5 mg total) by mouth at bedtime as needed for anxiety. 60 tablet 3  . anastrozole (ARIMIDEX) 1 MG tablet Take 1 tablet (1 mg total) by mouth daily. 90 tablet 3  . Biotin 10 MG CAPS Take by mouth.    . calcium carbonate (TUMS - DOSED IN MG ELEMENTAL CALCIUM) 500 MG chewable tablet Chew 1 tablet by mouth as needed for indigestion or heartburn.     .Leslee Home125 MG tablet TAKE 1 TABLET BY MOUTH ONCE DAILY WITH OR WITHOUT FOOD  FOR 21 DAYS, FOLLOWED BY 7  DAYS OFF, REPEATED EVERY 28 DAYS 21 tablet 2  . LORazepam (ATIVAN) 0.5 MG tablet TAKE 1 TABLET(0.5 MG) BY MOUTH AT BEDTIME AS NEEDED FOR NAUSEA OR VOMITING 30 tablet 3  . Multiple Vitamins-Minerals (WOMENS MULTI VITAMIN & MINERAL) TABS Take by mouth.     No current facility-administered medications for this visit.    PHYSICAL EXAMINATION: ECOG PERFORMANCE STATUS: 0 - Asymptomatic  Vitals:   09/30/19 1004  BP: (!) 148/109  Pulse: 104  Resp: 18  Temp: 99.1 F (37.3 C)  SpO2:  100%   Filed Weights   09/30/19 1004  Weight: (!) 203 lb (92.1 kg)    LABORATORY DATA:  I have reviewed the data as listed CMP Latest Ref Rng & Units 09/30/2019 06/24/2019 05/27/2019  Glucose 70 - 99 mg/dL 96 105(H) 101(H)  BUN 6 - 20 mg/dL _0 Creatinine 0.44 - 1.00 mg/dL 0.78 0.85 0.81  Sodium 135 - 145 mmol/L 136 140 140  Potassium 3.5 - 5.1 mmol/L 3.7 4.1 3.7  Chloride 98 - 111 mmol/L 103 104 103  CO2 22 - 32 mmol/L _1 Calcium 8.9 - 10.3 mg/dL  10.1 9.1 9.3  Total Protein 6.5 - 8.1 g/dL 8.0 7.4 7.3  Total Bilirubin 0.3 - 1.2 mg/dL 0.8 0.5 0.4  Alkaline Phos 38 - 126 U/L 56 76 70  AST 15 - 41 U/L _2 ALT 0 - 44 U/L 32 24 23    Lab Results  Component Value Date   WBC 2.1 (L) 09/30/2019   HGB 12.2 09/30/2019   HCT 34.8 (L) 09/30/2019   MCV 105.8 (H) 09/30/2019   PLT 208 09/30/2019   NEUTROABS PENDING 09/30/2019    ASSESSMENT & PLAN:  Malignant neoplasm of upper-outer quadrant of left breast in female, estrogen receptor positive ([place]) 05/13/2018:Evaluation of 5 months of thickening of the left breast upper outer quadrant. 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 Several lymph nodes were identified by ultrasound. T2 N1 stage IIb clinical stage  Treatment plan: 1.Neoadjuvant chemotherapy with dose dense Adriamycin and Cytoxan followed by Taxol weekly x12completed 10/08/2018 2.11/05/2018:Left lumpectomy [name]: residual invasive carcinoma s/p neoadjuvant treatment, HER-2 - (0), ER+ 100%, PR+ 60%, Ki67 10%, involved inferior and medial margins, and 8/9 left axillary lymph nodes positive for carcinoma. Resection of margins 11/18/2018: Residual cancer was identified in the left inferior margin and the final margins are clear. 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 ------------------------------------------------------------------------------------------------------------------------------------------------------  Current Treatment:Ibrance  along with letrozolestarted 02/24/19, today cycle7-day 1 Ibrance Toxicities:Denies any nausea vomiting denies any fatigue.  PET CT scan 03/20/2018: Right sided cervical lymph nodes which could be reactive versus metastatic. Otherwise no other findings of metastatic disease. Left upper lobe hypermetabolic airspace disease is radiation related. Nonspecific right-sided pulmonary nodules below PET resolution  Ibrance toxicities:Denies any adverse effects to [place]. 1. Leukopenia: ANC2.2so we will continue the dosage at 125 mg. 2. Elevated AST and ALT: Most likely related to [place]. These results are normal today. CT chest 06/19/2019: No acute process of metastatic disease in the chest.  Left upper lobe radiation change.  Right lower lobe 3 mm lung nodule.  Fatty liver.    Continue the current treatment plan with Ibrance and letrozole. Return to clinic in 3 months for labs, restaging PET/CT scans and follow-up    Orders Placed This Encounter  Procedures  . NM PET Image Restag (PS) Skull Base To Thigh    Standing Status:   Future    Standing Expiration Date:   09/29/2020    Order Specific Question:   If indicated for the ordered procedure, I authorize the administration of a radiopharmaceutical per Radiology protocol  Answer:   Yes    Order Specific Question:   Is the patient pregnant?    Answer:   No    Order Specific Question:   Preferred imaging location?    Answer:   [facility]    Order Specific Question:   Release to patient    Answer:   Immediate    Order Specific Question:   Radiology Contrast Protocol - do NOT remove file path    Answer:   [email or web address]   The patient has a good understanding of the overall plan. she agrees with it. she will call with any problems that may develop before the next visit here.  Total time spent: 30 mins including face to face time and time spent for planning, charting and coordination of care  [name], MD 09/30/2019  I, Cloyde Reams Dorshimer, am acting as scribe for Dr. [name].  I have reviewed the above documentation for accuracy and completeness, and I agree with the above.

## 2019-09-30 ENCOUNTER — Inpatient Hospital Stay: Payer: BC Managed Care – PPO | Attending: Hematology and Oncology

## 2019-09-30 ENCOUNTER — Inpatient Hospital Stay (HOSPITAL_BASED_OUTPATIENT_CLINIC_OR_DEPARTMENT_OTHER): Payer: BC Managed Care – PPO | Admitting: Hematology and Oncology

## 2019-09-30 ENCOUNTER — Other Ambulatory Visit: Payer: Self-pay

## 2019-09-30 VITALS — BP 148/109 | HR 104 | Temp 99.1°F | Resp 18 | Ht 68.0 in | Wt 203.0 lb

## 2019-09-30 DIAGNOSIS — Z170 Estrogen receptor positive status [ER+]: Secondary | ICD-10-CM

## 2019-09-30 DIAGNOSIS — D72819 Decreased white blood cell count, unspecified: Secondary | ICD-10-CM | POA: Insufficient documentation

## 2019-09-30 DIAGNOSIS — R7401 Elevation of levels of liver transaminase levels: Secondary | ICD-10-CM | POA: Insufficient documentation

## 2019-09-30 DIAGNOSIS — C50412 Malignant neoplasm of upper-outer quadrant of left female breast: Secondary | ICD-10-CM | POA: Insufficient documentation

## 2019-09-30 DIAGNOSIS — Z923 Personal history of irradiation: Secondary | ICD-10-CM | POA: Insufficient documentation

## 2019-09-30 DIAGNOSIS — K760 Fatty (change of) liver, not elsewhere classified: Secondary | ICD-10-CM | POA: Diagnosis not present

## 2019-09-30 DIAGNOSIS — C50919 Malignant neoplasm of unspecified site of unspecified female breast: Secondary | ICD-10-CM

## 2019-09-30 DIAGNOSIS — Z9221 Personal history of antineoplastic chemotherapy: Secondary | ICD-10-CM | POA: Diagnosis not present

## 2019-09-30 DIAGNOSIS — R911 Solitary pulmonary nodule: Secondary | ICD-10-CM | POA: Insufficient documentation

## 2019-09-30 DIAGNOSIS — C7951 Secondary malignant neoplasm of bone: Secondary | ICD-10-CM | POA: Diagnosis not present

## 2019-09-30 DIAGNOSIS — Z79899 Other long term (current) drug therapy: Secondary | ICD-10-CM | POA: Insufficient documentation

## 2019-09-30 LAB — CMP (CANCER CENTER ONLY)
ALT: 32 U/L (ref 0–44)
AST: 25 U/L (ref 15–41)
Albumin: 4.3 g/dL (ref 3.5–5.0)
Alkaline Phosphatase: 56 U/L (ref 38–126)
Anion gap: 10 (ref 5–15)
BUN: 9 mg/dL (ref 6–20)
CO2: 23 mmol/L (ref 22–32)
Calcium: 10.1 mg/dL (ref 8.9–10.3)
Chloride: 103 mmol/L (ref 98–111)
Creatinine: 0.78 mg/dL (ref 0.44–1.00)
GFR, Est AFR Am: >60 mL/min (ref >60)
GFR, Estimated: >60 mL/min (ref >60)
Glucose, Bld: 96 mg/dL (ref 70–99)
Potassium: 3.7 mmol/L (ref 3.5–5.1)
Sodium: 136 mmol/L (ref 135–145)
Total Bilirubin: 0.8 mg/dL (ref 0.3–1.2)
Total Protein: 8 g/dL (ref 6.5–8.1)

## 2019-09-30 LAB — CBC WITH DIFFERENTIAL (CANCER CENTER ONLY)
Abs Immature Granulocytes: 0.01 10*3/uL (ref 0.00–0.07)
Basophils Absolute: 0.1 10*3/uL (ref 0.0–0.1)
Basophils Relative: 2 %
Eosinophils Absolute: 0 10*3/uL (ref 0.0–0.5)
Eosinophils Relative: 1 %
HCT: 34.8 % — ABNORMAL LOW (ref 36.0–46.0)
Hemoglobin: 12.2 g/dL (ref 12.0–15.0)
Immature Granulocytes: 1 %
Lymphocytes Relative: 32 %
Lymphs Abs: 0.7 10*3/uL (ref 0.7–4.0)
MCH: 37.1 pg — ABNORMAL HIGH (ref 26.0–34.0)
MCHC: 35.1 g/dL (ref 30.0–36.0)
MCV: 105.8 fL — ABNORMAL HIGH (ref 80.0–100.0)
Monocytes Absolute: 0.2 10*3/uL (ref 0.1–1.0)
Monocytes Relative: 7 %
Neutro Abs: 1.2 10*3/uL — ABNORMAL LOW (ref 1.7–7.7)
Neutrophils Relative %: 57 %
Platelet Count: 208 10*3/uL (ref 150–400)
RBC: 3.29 MIL/uL — ABNORMAL LOW (ref 3.87–5.11)
RDW: 12.1 % (ref 11.5–15.5)
WBC Count: 2.1 10*3/uL — ABNORMAL LOW (ref 4.0–10.5)
nRBC: 0 % (ref 0.0–0.2)

## 2019-09-30 NOTE — Assessment & Plan Note (Signed)
05/13/2018:Evaluation of 5 months of thickening of the left breast upper outer quadrant. 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 Several lymph nodes were identified by ultrasound. T2 N1 stage IIb clinical stage  Treatment plan: 1.Neoadjuvant chemotherapy with dose dense Adriamycin and Cytoxan followed by Taxol weekly x12completed 10/08/2018 2.11/05/2018:Left lumpectomy [name]: residual invasive carcinoma s/p neoadjuvant treatment, HER-2 - (0), ER+ 100%, PR+ 60%, Ki67 10%, involved inferior and medial margins, and 8/9 left axillary lymph nodes positive for carcinoma. Resection of margins 11/18/2018: Residual cancer was identified in the left inferior margin and the final margins are clear. 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 ------------------------------------------------------------------------------------------------------------------------------------------------------  Current Treatment:Ibrance along with letrozolestarted 02/24/19, today cycle7-day 1 Ibrance Toxicities:Denies any nausea vomiting denies any fatigue.  PET CT scan 03/20/2018: Right sided cervical lymph nodes which could be reactive metastatic. Otherwise no other findings of metastatic disease. Left upper lobe hypermetabolic airspace disease is radiation related. Nonspecific right-sided pulmonary nodules below PET resolution  Ibrance toxicities:Denies any adverse effects to [place]. 1. Leukopenia: ANC2.2so we will continue the  dosage at 125 mg. 2. Elevated AST and ALT: Most likely related to [place].  These levels have normalized. CT chest 06/19/2019: No acute process of metastatic disease in the chest.  Left upper lobe radiation change.  Right lower lobe 3 mm lung nodule.  Fatty liver.    Continue the current treatment plan with Ibrance and letrozole. Return to clinic in 3 months for labs scans and follow-up

## 2019-10-01 ENCOUNTER — Telehealth: Payer: Self-pay | Admitting: Hematology and Oncology

## 2019-10-01 NOTE — Telephone Encounter (Signed)
Scheduled per 7/27 los. Called and left a msg, mailing appt letter and calendar printout

## 2019-10-14 DIAGNOSIS — Z1151 Encounter for screening for human papillomavirus (HPV): Secondary | ICD-10-CM | POA: Diagnosis not present

## 2019-10-14 DIAGNOSIS — Z01419 Encounter for gynecological examination (general) (routine) without abnormal findings: Secondary | ICD-10-CM | POA: Diagnosis not present

## 2019-11-07 ENCOUNTER — Encounter: Payer: Self-pay | Admitting: Hematology and Oncology

## 2019-11-27 ENCOUNTER — Other Ambulatory Visit: Payer: Self-pay | Admitting: Hematology and Oncology

## 2019-11-27 DIAGNOSIS — Z170 Estrogen receptor positive status [ER+]: Secondary | ICD-10-CM

## 2019-11-27 DIAGNOSIS — C50412 Malignant neoplasm of upper-outer quadrant of left female breast: Secondary | ICD-10-CM

## 2020-01-02 ENCOUNTER — Ambulatory Visit (HOSPITAL_COMMUNITY): Admission: RE | Admit: 2020-01-02 | Payer: BC Managed Care – PPO | Source: Ambulatory Visit

## 2020-01-02 ENCOUNTER — Encounter: Payer: Self-pay | Admitting: Hematology and Oncology

## 2020-01-05 ENCOUNTER — Inpatient Hospital Stay: Payer: BC Managed Care – PPO

## 2020-01-05 ENCOUNTER — Inpatient Hospital Stay: Payer: BC Managed Care – PPO | Admitting: Hematology and Oncology

## 2020-01-14 IMAGING — MR MR BILATERAL BREAST WITHOUT AND WITH CONTRAST
8 of 12 series · 31 of 48 positions shown · IV contrast (10 ml gadavist)
Comparison: 05/22/2018 MRI, mammogram 05/13/2018 and earlier

CLINICAL DATA: LEFT breast cancer diagnosed in Saturday May, 2018.
Patient completes neoadjuvant chemotherapy on 10/08/2018. Patient is
scheduled for lumpectomy 11/05/2018.

LABS:  None obtained at the time of imaging.
EXAM:
BILATERAL BREAST MRI WITH AND WITHOUT CONTRAST
TECHNIQUE: Multiplanar, multisequence MR images of both breasts were obtained
prior to and following the intravenous administration of 10 ml of
Gadavist

[Series 2: t2_tirm_tra ipat (a-p) · axial · 3.0mm · 0.74mm/px · 1 of 70 slices shown]
[im 1/70]
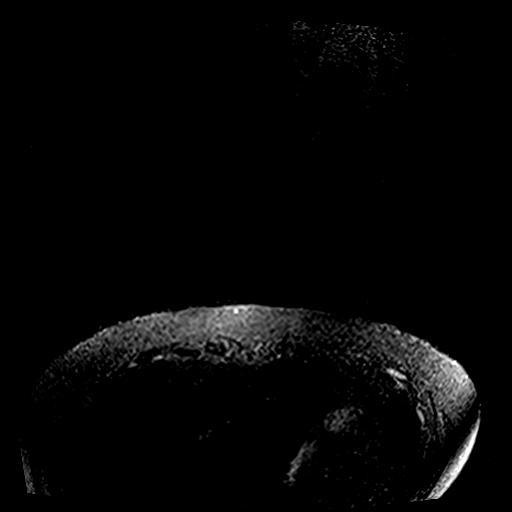

[Series 3: fl3d pre-cm no · axial · non-contrast · 1.2mm · 1.02mm/px · z∈[-49,+161]mm · 5 of 176 slices shown]
[im 1/176]
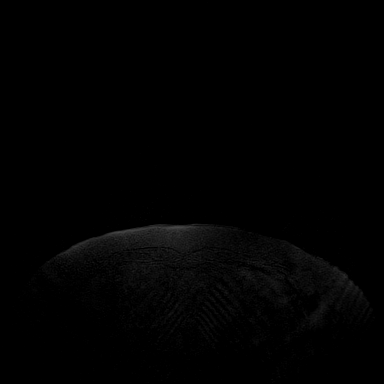
[im 44/176]
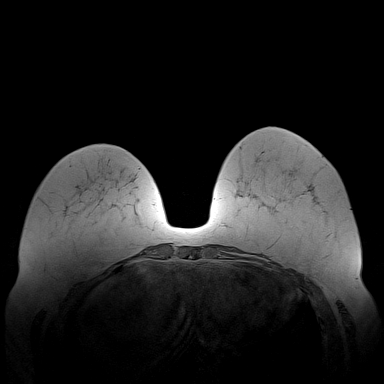
[im 88/176]
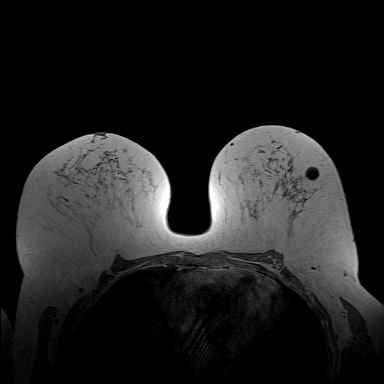
[im 132/176]
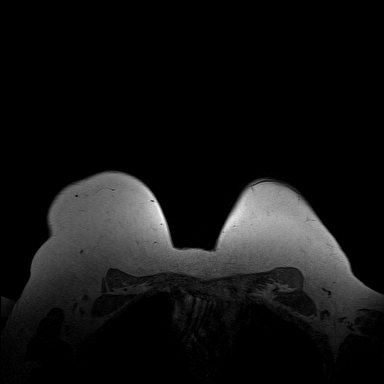
[im 176/176]
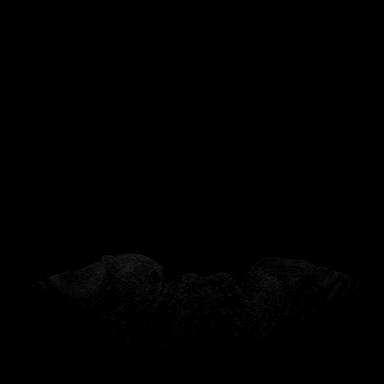

[Series 4: fl3d pre-cm · axial · non-contrast · 1.2mm · 1.02mm/px · z∈[-49,+161]mm · 5 of 176 slices shown]
[im 1/176]
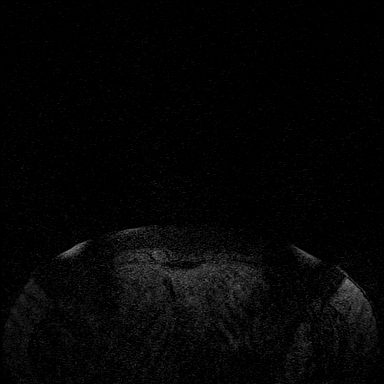
[im 44/176]
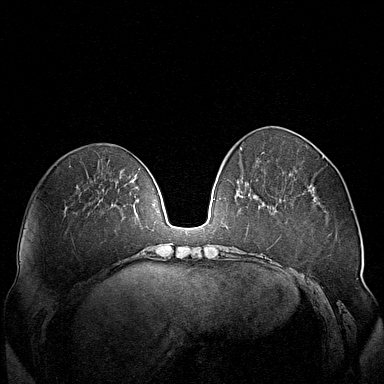
[im 88/176]
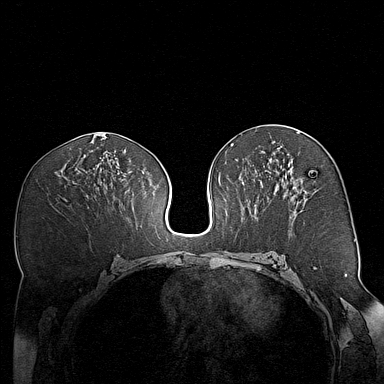
[im 132/176]
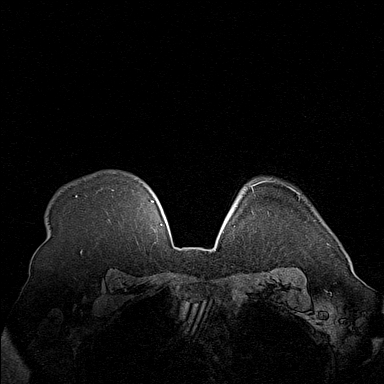
[im 176/176]
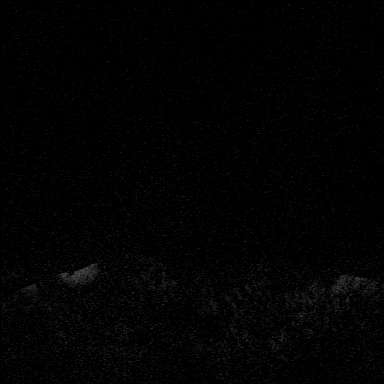

[Series 5: fl3d post-cm 20 · axial · 1.2mm · 1.02mm/px · z∈[-49,+161]mm · 5 of 176 slices shown (1 of 3)]
[im 1/176]
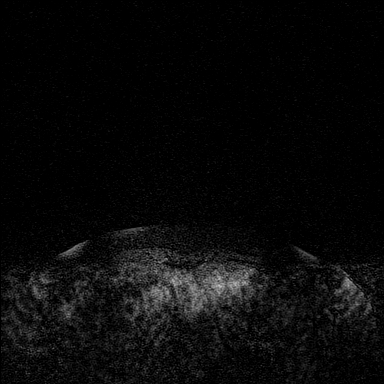
[im 44/176]
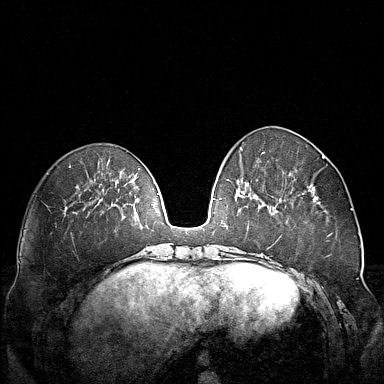
[im 88/176]
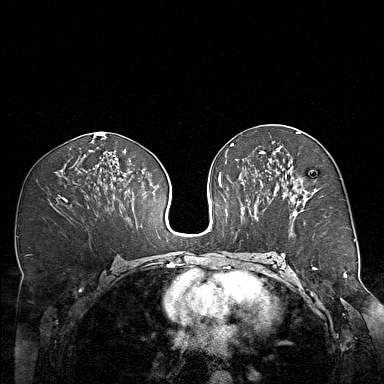
[im 132/176]
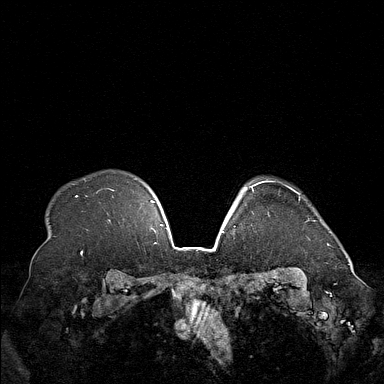
[im 176/176]
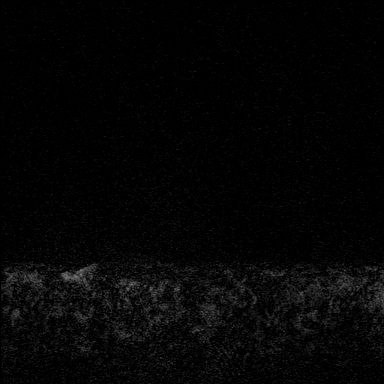

[Series 6: fl3d post-cm 20 · axial · 1.2mm · 1.02mm/px · z∈[-49,+161]mm · 5 of 176 slices shown (2 of 3)]
[im 1/176]
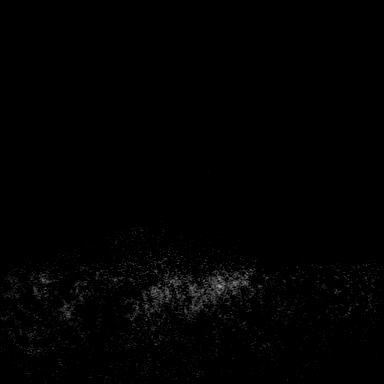
[im 44/176]
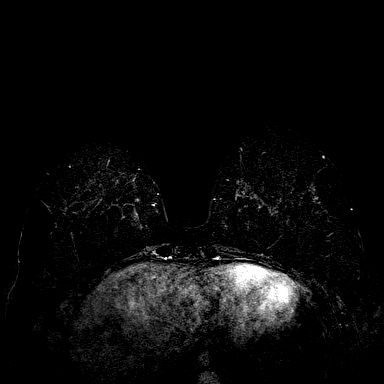
[im 88/176]
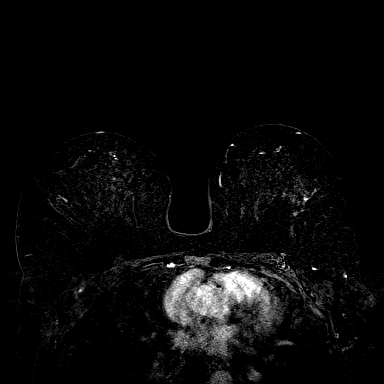
[im 132/176]
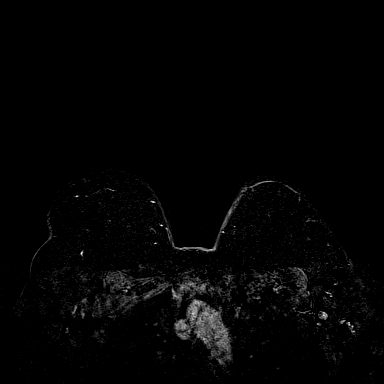
[im 176/176]
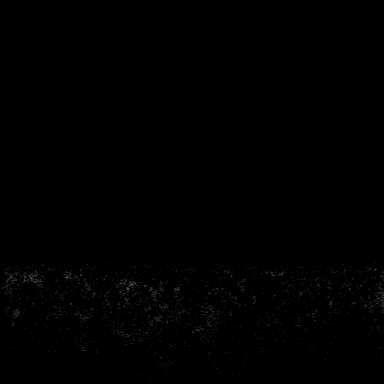

[Series 7: fl3d post-cm 20 · axial · 211.2mm · 1.02mm/px · 1 of 1 slices shown (3 of 3)]
[im 1/1]
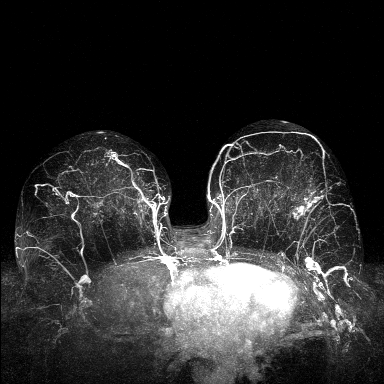

[Series 8: fl3d post-cm 3min · axial · 1.2mm · 1.02mm/px · z∈[-49,+161]mm · 6 of 176 slices shown]
[im 1/176]
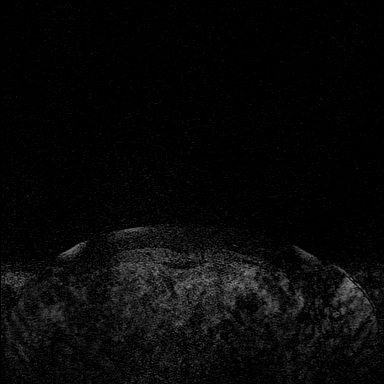
[im 36/176]
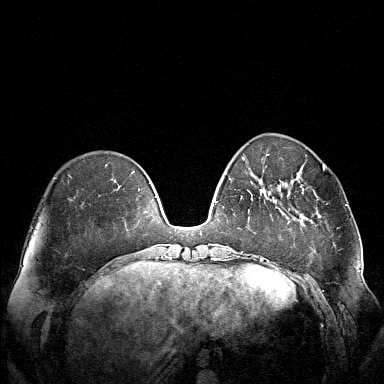
[im 71/176]
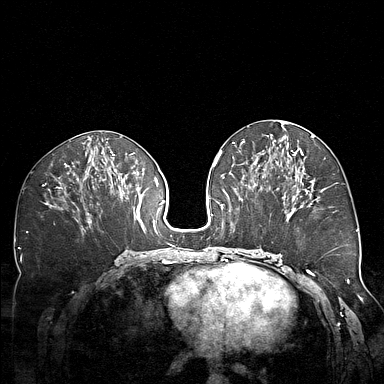
[im 106/176]
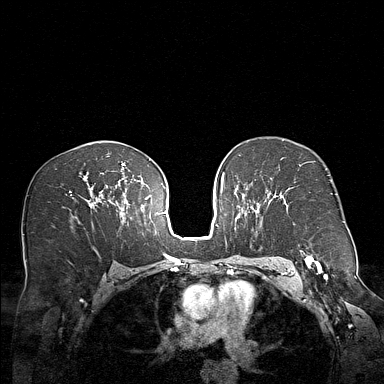
[im 141/176]
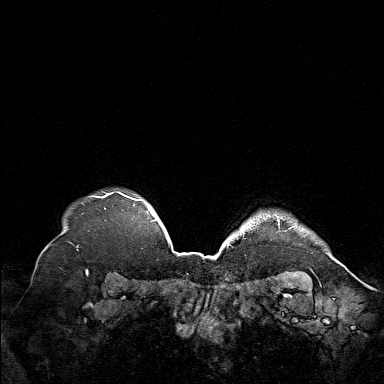
[im 176/176]
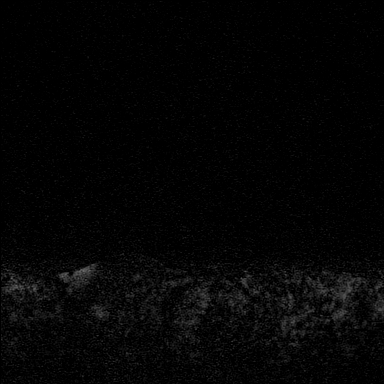

[Series 9: fl3d post-cm 3min_sub · axial · 1.2mm · 1.02mm/px · z∈[-49,+35]mm · 3 of 176 slices shown]
[im 1/176]
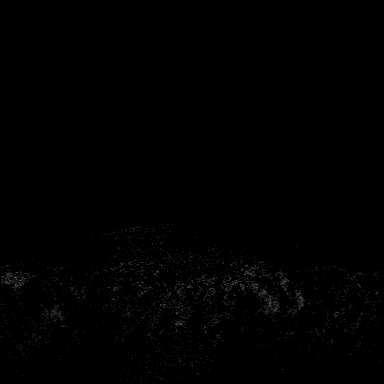
[im 36/176]
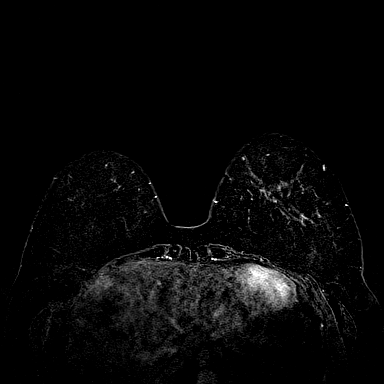
[im 71/176]
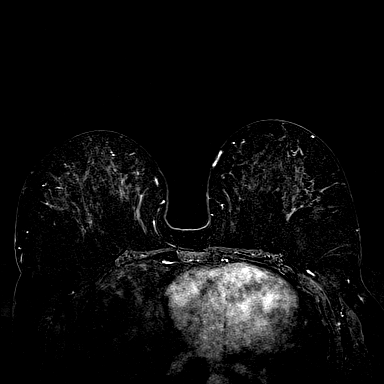

[31 of 48 positions shown; findings below may reference images not displayed]

Three-dimensional MR images were rendered by post-processing of the
original MR data on an independent workstation. The
three-dimensional MR images were interpreted, and findings are
reported in the following complete MRI report for this study. Three
dimensional images were evaluated at the independent [employer]
workstation
FINDINGS: Breast composition: b. Scattered fibroglandular tissue.

Background parenchymal enhancement: Mild.

Right breast: No mass or abnormal enhancement.

Left breast: Within the LATERAL aspect of the LEFT breast there is
residual non mass enhancement with heterogeneous internal
enhancement characteristics and persistent type enhancement
kinetics. This measures 3.7 x 1.4 x 3.3 centimeters. Enhancing mass
in this region previously measured 5.1 x 3.1 x 3.6 centimeters. Of
note, the tissue marker clip artifact is 1.8 centimeters anterior to
the anterior edge of the residual enhancement in the LEFT breast.

Lymph nodes: LOWER LEFT axillary lymph node adjacent to a tissue
marker clip now has normal morphology. Within the UPPER level I
station, a single small lymph node has a cortical thickness of 5
millimeters. Otherwise, lymph nodes in the LEFT axilla have normal
morphology. Previously, 6 enlarged LEFT axillary lymph nodes were
identified.

Ancillary findings:  None.
IMPRESSION: 1. Significantly improved appearance of known LEFT breast
malignancy. Non mass enhancement persists, measuring
centimeters. Of note, the tissue marker clip is 1.8 centimeters
anterior to the anterior edge of the residual enhancement. Consider
MR guided placement of a clip along the posterior aspect of the non
mass enhancement so that a bracketed seed localization can be
performed prior to lumpectomy.
2. Significant improvement in LEFT axillary lymph nodes. A single
lymph node has mild cortical thickening. Other lymph nodes appear
normal. Previously there were 6 abnormal lymph nodes in the LEFT
axilla.

RECOMMENDATION:
Recommend MR guided clip placement along the posterior aspect of non
mass enhancement in the LEFT breast so that bracketed seed
localization can be performed prior to lumpectomy.

Treatment plan for known malignancy in the LEFT breast.

BI-RADS CATEGORY  6: Known biopsy-proven malignancy.

## 2020-01-16 ENCOUNTER — Other Ambulatory Visit: Payer: Self-pay | Admitting: *Deleted

## 2020-01-16 ENCOUNTER — Encounter (HOSPITAL_COMMUNITY): Payer: BC Managed Care – PPO

## 2020-01-16 DIAGNOSIS — Z170 Estrogen receptor positive status [ER+]: Secondary | ICD-10-CM

## 2020-01-16 DIAGNOSIS — C50412 Malignant neoplasm of upper-outer quadrant of left female breast: Secondary | ICD-10-CM

## 2020-01-19 ENCOUNTER — Inpatient Hospital Stay: Payer: BC Managed Care – PPO | Admitting: Hematology and Oncology

## 2020-01-19 ENCOUNTER — Other Ambulatory Visit: Payer: Self-pay

## 2020-01-19 ENCOUNTER — Ambulatory Visit (HOSPITAL_COMMUNITY)
Admission: RE | Admit: 2020-01-19 | Discharge: 2020-01-19 | Disposition: A | Discharge status: Home / Self Care | Payer: BC Managed Care – PPO | Source: Ambulatory Visit | Attending: Hematology and Oncology | Admitting: Hematology and Oncology

## 2020-01-19 ENCOUNTER — Inpatient Hospital Stay: Payer: BC Managed Care – PPO

## 2020-01-19 DIAGNOSIS — C50919 Malignant neoplasm of unspecified site of unspecified female breast: Secondary | ICD-10-CM | POA: Diagnosis present

## 2020-01-19 DIAGNOSIS — C50412 Malignant neoplasm of upper-outer quadrant of left female breast: Secondary | ICD-10-CM | POA: Diagnosis not present

## 2020-01-19 DIAGNOSIS — Z170 Estrogen receptor positive status [ER+]: Secondary | ICD-10-CM | POA: Diagnosis present

## 2020-01-19 LAB — GLUCOSE, CAPILLARY: Glucose-Capillary: 97 mg/dL (ref 70–99)

## 2020-01-19 MED ORDER — FLUDEOXYGLUCOSE F - 18 (FDG) INJECTION
10.7000 | Freq: Once | INTRAVENOUS | Status: AC
  Administered 2020-01-19: 10.7 via INTRAVENOUS

## 2020-01-19 NOTE — Progress Notes (Signed)
Patient Care Team: [name], MD as PCP - General (Family Medicine) [name], MD as Consulting Physician (Hematology and Oncology) [name], MD as Consulting Physician (Radiation Oncology) [name], MD as Consulting Physician (General Surgery)  DIAGNOSIS:    ICD-10-CM   1. Metastatic breast cancer ([place])  C50.919 Guardant 360  2. Malignant neoplasm of upper-outer quadrant of left breast in female, estrogen receptor positive ([place])  C50.412 Guardant 360   Z17.0     SUMMARY OF ONCOLOGIC HISTORY: Oncology History  Malignant neoplasm of upper-outer quadrant of left breast in female, estrogen receptor positive ([place])  05/13/2018 Initial Diagnosis   Evaluation of 5 months of thickening of the left breast upper outer quadrant.  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  Several lymph nodes were identified by ultrasound.  T2 N1 stage IIb clinical stage   05/16/2018 Cancer Staging   Staging form: Breast, AJCC 8th Edition - Clinical stage from 05/16/2018: Stage IIB (cT2, cN1, cM0, G3, ER+, PR+, HER2-) - Signed by [name], MD on 05/16/2018   05/28/2018 - 10/08/2018 Chemotherapy   DOXOrubicin (ADRIAMYCIN) chemo injection 128 mg, 60 mg/m2 = 128 mg, Intravenous,  Once, 4 of 4 cycles. Administration: 128 mg (05/28/2018), 128 mg (06/11/2018), 128 mg (06/25/2018), 128 mg (07/09/2018)  palonosetron (ALOXI) injection 0.25 mg, 0.25 mg, Intravenous,  Once, 9 of 9 cycles. Administration: 0.25 mg (05/28/2018), 0.25 mg (06/11/2018), 0.25 mg (06/25/2018), 0.25 mg (07/09/2018), 0.25 mg (09/10/2018), 0.25 mg (09/17/2018), 0.25 mg (09/24/2018), 0.25 mg (10/01/2018), 0.25 mg (10/08/2018)  pegfilgrastim (NEULASTA ONPRO KIT) injection 6 mg, 6 mg, Subcutaneous,  Once, 4 of 4 cycles. Administration: 6 mg (05/28/2018), 6 mg (06/11/2018), 6 mg (06/25/2018), 6 mg (07/09/2018)  cyclophosphamide (CYTOXAN) 1,280 mg in sodium chloride 0.9 % 250 mL chemo infusion, 600 mg/m2 = 1,280 mg, Intravenous,  Once, 4 of 4 cycles. Administration: 1,280 mg (05/28/2018), 1,280 mg (06/11/2018), 1,280 mg (06/25/2018), 1,280 mg (07/09/2018)  PACLitaxel (TAXOL) 168 mg in sodium chloride 0.9 % 250 mL chemo infusion (</= 37m/m2), 80 mg/m2 = 168 mg, Intravenous,  Once, 12 of 12 cycles. Administration: 168 mg (07/23/2018), 168 mg (07/30/2018), 168 mg (08/06/2018), 168 mg (08/13/2018), 168 mg (08/20/2018), 168 mg (08/27/2018), 168 mg (09/03/2018), 168 mg (09/10/2018), 168 mg (09/17/2018), 168 mg (09/24/2018), 168 mg (10/01/2018), 168 mg (10/08/2018)  fosaprepitant (EMEND) 150 mg, dexamethasone (DECADRON) 12 mg in sodium chloride 0.9 % 145 mL IVPB, , Intravenous,  Once, 4 of 4 cycles. Administration:  (05/28/2018),  (06/11/2018),  (06/25/2018),  (07/09/2018)    08/16/2018 Genetic Testing   Negative genetic testing on the multi-cancer panel.  The Multi-Gene Panel offered by [facility] includes sequencing and/or deletion duplication testing of the following 85 genes: AIP, ALK, APC, ATM, AXIN2,BAP1,  BARD1, BLM, BMPR1A, BRCA1, BRCA2, BRIP1, CASR, CDC73, CDH1, CDK4, CDKN1B, CDKN1C, CDKN2A (p14ARF), CDKN2A (p16INK4a), CEBPA, CHEK2, CTNNA1, DICER1, DIS3L2, EGFR (c.2369C>T, p.Thr790Met variant only), EPCAM (Deletion/duplication testing only), FH, FLCN, GATA2, GPC3, GREM1 (Promoter region deletion/duplication testing only), HOXB13 (c.251G>A, p.Gly84Glu), HRAS, KIT, MAX, MEN1, MET, MITF (c.952G>A, p.Glu318Lys variant only), MLH1, MSH2, MSH3, MSH6, MUTYH, NBN, NF1, NF2, NTHL1, PALB2, PDGFRA, PHOX2B, PMS2, POLD1, POLE, POT1, PRKAR1A, PTCH1, PTEN, RAD50, RAD51C, RAD51D, RB1, RECQL4, RET, RNF43, RUNX1, SDHAF2, SDHA (sequence changes only), SDHB, SDHC, SDHD, SMAD4, SMARCA4, SMARCB1, SMARCE1, STK11,  SUFU, TERC, TERT, TMEM127, TP53, TSC1, TSC2,  VHL, WRN and WT1.  The report date is August 16, 2018.   10/07/2018 Breast MRI   Significant improvement of left breast malignancy.  Non-mass enhancement 3.7 cm.  Significant improvement in the left axillary lymph nodes previously there were 6 nodes and currently there was a single lymph node with mild cortical thickening.   11/05/2018 Surgery   Left lumpectomy [name] [phone]): residual invasive carcinoma s/p neoadjuvant treatment, HER-2 - (0), ER+ 100%, PR+ 60%, Ki67 10%, involved inferior and medial margins, and 8/9 left axillary lymph nodes positive for carcinoma.   11/05/2018 Cancer Staging   Staging form: Breast, AJCC 8th Edition - Pathologic stage from 11/05/2018: No Stage Recommended (ypT2, pN2a, cM0, ER+, PR+, HER2-) - Signed by [name], NP on 11/27/2018   11/18/2018 Surgery   Re-excision of left lumpectomy [name] [id number]): residual invasive carcinoma, clear margins.   12/30/2018 - 02/14/2019 Radiation Therapy   The patient initially received a dose of 50.4 Gy in 28 fractions to the breast using whole-breast tangent fields. This was delivered using a 3-D conformal technique. The patient also received a dose of 50.4 Gy in 28 fraction to the left axilla. The pt received a boost delivering an additional 12 Gy in 6 fractions using a electron boost with 75mV electrons. The total dose was 112.8 Gy.    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 with above-mentioned history of metastatic breast cancer currently on treatment with Ibrance and letrozole.PET scan on 01/19/20  showed progressive malignancy with new hepatic and osseous metastases, and resolution of the airspace opacities in the left lung and the faintly hypermetabolic lymph nodes in the right neck. She presents to the clinic todayfor a toxicity check and to review her scan.  She does not have any side effects or pain or discomfort related to the treatment or her disease.  ALLERGIES:  has No Known Allergies.  MEDICATIONS:  Current Outpatient Medications  Medication Sig Dispense Refill  . ALPRAZolam (XANAX) 0.5 MG tablet Take 1 tablet (0.5 mg total) by mouth at bedtime as needed for anxiety. 60 tablet 3  . Biotin 10 MG CAPS Take by mouth.    . calcium carbonate (TUMS - DOSED IN MG ELEMENTAL CALCIUM) 500 MG chewable tablet Chew 1 tablet by mouth as needed for indigestion or heartburn.     .Leslee Home125 MG tablet TAKE 1 TABLET BY MOUTH ONCE DAILY WITH OR WITHOUT FOOD  FOR 21 DAYS, FOLLOWED BY 7  DAYS OFF, REPEATED EVERY 28 DAYS 21 tablet 2  . letrozole (FEMARA) 2.5 MG tablet Take 1 tablet (2.5 mg total) by mouth daily. 90 tablet 3  . Multiple Vitamins-Minerals (WOMENS MULTI VITAMIN & MINERAL) TABS Take by mouth.     No current facility-administered medications for this visit.    PHYSICAL EXAMINATION: ECOG PERFORMANCE STATUS: 1 - Symptomatic but completely ambulatory  Vitals:   01/20/20 1320  BP: (!) 142/107  Pulse: 96  Resp: 18  Temp: 98.8 F (37.1 C)  SpO2: 100%   Filed Weights   01/20/20 1320  Weight: 202 lb 6.4 oz (91.8 kg)  LABORATORY DATA:  I have reviewed the data as listed CMP Latest Ref Rng & Units 01/20/2020 09/30/2019 06/24/2019  Glucose 70 - 99 mg/dL 103(H) 96 105(H)  BUN 6 - 20 mg/dL 10 9 10  Creatinine 0.44 - 1.00 mg/dL 0.78 0.78 0.85  Sodium 135 - 145 mmol/L 137 136 140  Potassium 3.5 - 5.1 mmol/L 3.8 3.7 4.1  Chloride 98 - 111 mmol/L 102 103 104  CO2 22 - 32 mmol/L 25 23 25  Calcium 8.9 - 10.3 mg/dL 9.7 10.1 9.1  Total Protein 6.5 - 8.1 g/dL 8.0 8.0 7.4  Total  Bilirubin 0.3 - 1.2 mg/dL 0.7 0.8 0.5  Alkaline Phos 38 - 126 U/L 77 56 76  AST 15 - 41 U/L 19 25 18  ALT 0 - 44 U/L 24 32 24    Lab Results  Component Value Date   WBC 4.4 01/20/2020   HGB 12.6 01/20/2020   HCT 35.3 (L) 01/20/2020   MCV 103.8 (H) 01/20/2020   PLT 266 01/20/2020   NEUTROABS 3.2 01/20/2020    ASSESSMENT & PLAN:  Malignant neoplasm of upper-outer quadrant of left breast in female, estrogen receptor positive ([facility]) 05/13/2018:Evaluation of 5 months of thickening of the left breast upper outer quadrant. 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 Several lymph nodes were identified by ultrasound. T2 N1 stage IIb clinical stage  Treatment plan: 1.Neoadjuvant chemotherapy with dose dense Adriamycin and Cytoxan followed by Taxol weekly x12completed 10/08/2018 2.11/05/2018:Left lumpectomy ([place]): residual invasive carcinoma s/p neoadjuvant treatment, HER-2 - (0), ER+ 100%, PR+ 60%, Ki67 10%, involved inferior and medial margins, and 8/9 left axillary lymph nodes positive for carcinoma. Resection of margins 11/18/2018: Residual cancer was identified in the left inferior margin and the final margins are clear. 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 ------------------------------------------------------------------------------------------------------------------------------------------------------  Current Treatment:Ibrance along with letrozolestarted 02/24/19  Ibrance Toxicities:Denies any  nausea vomiting denies any fatigue.  PET CT scan 03/20/2018: Right sided cervical lymph nodes which could be reactive versus metastatic. Otherwise no other findings of metastatic disease. Left upper lobe hypermetabolic airspace disease is radiation related. Nonspecific right-sided pulmonary nodules below PET resolution  Ibrance toxicities:Denies any adverse effects to Ibrance. 1. Leukopenia: ANC2.2so we will continue the dosage at 125 mg. 2. Elevated AST and ALT: Most likely related to Ibrance. These results are normal today. CT chest 06/19/2019: No acute process of metastatic disease in the chest. Left upper lobe radiation change. Right lower lobe 3 mm lung nodule. Fatty liver.  PET/CT 01/19/2020: Progressive findings with new metastatic lesions in the liver and skeleton. Dominant lesion SUV 8.3 measuring 1.3 cm. Porta hepatis lymph node measuring 0.9 cm SUV 7.2, celiac lymph node 0.7 cm SUV 5.6, multiple new small bone lesions sacrum, right iliac bone, L2, L1, T3 vertebral body. Right pubic bone SUV 8.5   Treatment plan: 1.  Guardant 360 2. add Faslodex to the treatment plan as we wait for molecular test results. I discussed with her about biopsying the liver but she is not very keen on it.  If the Faslodex does not control her cancer then we will biopsy.  Our plan is to obtain a   CT of the chest abdomen and pelvis in 2 months.  I also recommended Xgeva.  This will be given every 3 months.  Orders Placed This Encounter  Procedures  . Guardant 360    Standing Status:   Future    Number of Occurrences:   1    Standing Expiration Date:   01/19/2021   The patient has a good understanding of the overall plan. she agrees with it. she will call with any problems that may develop before the next visit here.  Total time spent: 30 mins including face to face time and time spent for planning, charting and coordination of care  [name], MD 01/20/2020  I, Cloyde Reams Dorshimer, am  acting as scribe for Dr. [name].  I have reviewed the above documentation for accuracy and completeness, and I agree with the above.

## 2020-01-20 ENCOUNTER — Inpatient Hospital Stay: Payer: BC Managed Care – PPO | Attending: Hematology and Oncology

## 2020-01-20 ENCOUNTER — Inpatient Hospital Stay: Payer: BC Managed Care – PPO

## 2020-01-20 ENCOUNTER — Inpatient Hospital Stay (HOSPITAL_BASED_OUTPATIENT_CLINIC_OR_DEPARTMENT_OTHER): Payer: BC Managed Care – PPO | Admitting: Hematology and Oncology

## 2020-01-20 ENCOUNTER — Encounter: Payer: Self-pay | Admitting: Hematology and Oncology

## 2020-01-20 ENCOUNTER — Other Ambulatory Visit: Payer: Self-pay

## 2020-01-20 VITALS — BP 142/107 | HR 96 | Temp 98.8°F | Resp 18 | Ht 68.0 in | Wt 202.4 lb

## 2020-01-20 DIAGNOSIS — Z170 Estrogen receptor positive status [ER+]: Secondary | ICD-10-CM

## 2020-01-20 DIAGNOSIS — C50412 Malignant neoplasm of upper-outer quadrant of left female breast: Secondary | ICD-10-CM

## 2020-01-20 DIAGNOSIS — C787 Secondary malignant neoplasm of liver and intrahepatic bile duct: Secondary | ICD-10-CM | POA: Insufficient documentation

## 2020-01-20 DIAGNOSIS — C7951 Secondary malignant neoplasm of bone: Secondary | ICD-10-CM | POA: Insufficient documentation

## 2020-01-20 DIAGNOSIS — Z79899 Other long term (current) drug therapy: Secondary | ICD-10-CM | POA: Insufficient documentation

## 2020-01-20 DIAGNOSIS — T451X5A Adverse effect of antineoplastic and immunosuppressive drugs, initial encounter: Secondary | ICD-10-CM | POA: Insufficient documentation

## 2020-01-20 DIAGNOSIS — R590 Localized enlarged lymph nodes: Secondary | ICD-10-CM | POA: Insufficient documentation

## 2020-01-20 DIAGNOSIS — K760 Fatty (change of) liver, not elsewhere classified: Secondary | ICD-10-CM | POA: Insufficient documentation

## 2020-01-20 DIAGNOSIS — C50919 Malignant neoplasm of unspecified site of unspecified female breast: Secondary | ICD-10-CM | POA: Diagnosis not present

## 2020-01-20 DIAGNOSIS — D702 Other drug-induced agranulocytosis: Secondary | ICD-10-CM | POA: Diagnosis not present

## 2020-01-20 LAB — CBC WITH DIFFERENTIAL (CANCER CENTER ONLY)
Abs Immature Granulocytes: 0.02 10*3/uL (ref 0.00–0.07)
Basophils Absolute: 0.1 10*3/uL (ref 0.0–0.1)
Basophils Relative: 1 %
Eosinophils Absolute: 0 10*3/uL (ref 0.0–0.5)
Eosinophils Relative: 1 %
HCT: 35.3 % — ABNORMAL LOW (ref 36.0–46.0)
Hemoglobin: 12.6 g/dL (ref 12.0–15.0)
Immature Granulocytes: 1 %
Lymphocytes Relative: 19 %
Lymphs Abs: 0.9 10*3/uL (ref 0.7–4.0)
MCH: 37.1 pg — ABNORMAL HIGH (ref 26.0–34.0)
MCHC: 35.7 g/dL (ref 30.0–36.0)
MCV: 103.8 fL — ABNORMAL HIGH (ref 80.0–100.0)
Monocytes Absolute: 0.3 10*3/uL (ref 0.1–1.0)
Monocytes Relative: 6 %
Neutro Abs: 3.2 10*3/uL (ref 1.7–7.7)
Neutrophils Relative %: 72 %
Platelet Count: 266 10*3/uL (ref 150–400)
RBC: 3.4 MIL/uL — ABNORMAL LOW (ref 3.87–5.11)
RDW: 12.3 % (ref 11.5–15.5)
WBC Count: 4.4 10*3/uL (ref 4.0–10.5)
nRBC: 0 % (ref 0.0–0.2)

## 2020-01-20 LAB — CMP (CANCER CENTER ONLY)
ALT: 24 U/L (ref 0–44)
AST: 19 U/L (ref 15–41)
Albumin: 4.2 g/dL (ref 3.5–5.0)
Alkaline Phosphatase: 77 U/L (ref 38–126)
Anion gap: 10 (ref 5–15)
BUN: 10 mg/dL (ref 6–20)
CO2: 25 mmol/L (ref 22–32)
Calcium: 9.7 mg/dL (ref 8.9–10.3)
Chloride: 102 mmol/L (ref 98–111)
Creatinine: 0.78 mg/dL (ref 0.44–1.00)
GFR, Estimated: >60 mL/min (ref >60)
Glucose, Bld: 103 mg/dL — ABNORMAL HIGH (ref 70–99)
Potassium: 3.8 mmol/L (ref 3.5–5.1)
Sodium: 137 mmol/L (ref 135–145)
Total Bilirubin: 0.7 mg/dL (ref 0.3–1.2)
Total Protein: 8 g/dL (ref 6.5–8.1)

## 2020-01-20 MED ORDER — LETROZOLE 2.5 MG PO TABS: 2.5000 mg | ORAL_TABLET | Freq: Every day | ORAL | 3 refills | Status: DC

## 2020-01-20 MED ORDER — ALPRAZOLAM 0.5 MG PO TABS: 0.5000 mg | ORAL_TABLET | Freq: Every evening | ORAL | 3 refills | Status: DC | PRN

## 2020-01-20 NOTE — Assessment & Plan Note (Signed)
05/13/2018:Evaluation of 5 months of thickening of the left breast upper outer quadrant. 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 Several lymph nodes were identified by ultrasound. T2 N1 stage IIb clinical stage  Treatment plan: 1.Neoadjuvant chemotherapy with dose dense Adriamycin and Cytoxan followed by Taxol weekly x12completed 10/08/2018 2.11/05/2018:Left lumpectomy [name]: residual invasive carcinoma s/p neoadjuvant treatment, HER-2 - (0), ER+ 100%, PR+ 60%, Ki67 10%, involved inferior and medial margins, and 8/9 left axillary lymph nodes positive for carcinoma. Resection of margins 11/18/2018: Residual cancer was identified in the left inferior margin and the final margins are clear. 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 ------------------------------------------------------------------------------------------------------------------------------------------------------  Current Treatment:Ibrance along with letrozolestarted 02/24/19  Ibrance Toxicities:Denies any nausea vomiting denies any fatigue.  PET CT scan 03/20/2018: Right sided cervical lymph nodes which could be reactive versus metastatic. Otherwise no other findings of metastatic disease. Left upper lobe hypermetabolic airspace disease is radiation related. Nonspecific right-sided pulmonary nodules below PET resolution  Ibrance toxicities:Denies any adverse effects to [place]. 1. Leukopenia: ANC2.2so we will continue the dosage at 125  mg. 2. Elevated AST and ALT: Most likely related to [place]. These results are normal today. CT chest 06/19/2019: No acute process of metastatic disease in the chest. Left upper lobe radiation change. Right lower lobe 3 mm lung nodule. Fatty liver.  PET/CT 01/19/2020: Progressive findings with new metastatic lesions in the liver and skeleton. Dominant lesion SUV 8.3 measuring 1.3 cm. Porta hepatis lymph node measuring 0.9 cm SUV 7.2, celiac lymph node 0.7 cm SUV 5.6, multiple new small bone lesions sacrum, right iliac bone, L2, L1, T3 vertebral body. Right pubic bone SUV 8.5   Treatment plan: 1. Biopsy of the liver lesion and sent for molecular testing and prognostic panel 2. add Faslodex to the treatment plan as we wait for molecular test results.

## 2020-01-22 ENCOUNTER — Encounter: Payer: Self-pay | Admitting: Hematology and Oncology

## 2020-01-26 ENCOUNTER — Inpatient Hospital Stay: Payer: BC Managed Care – PPO

## 2020-01-26 ENCOUNTER — Other Ambulatory Visit: Payer: Self-pay

## 2020-01-26 VITALS — BP 148/124 | HR 108 | Resp 18

## 2020-01-26 DIAGNOSIS — C50412 Malignant neoplasm of upper-outer quadrant of left female breast: Secondary | ICD-10-CM | POA: Diagnosis not present

## 2020-01-26 DIAGNOSIS — Z95828 Presence of other vascular implants and grafts: Secondary | ICD-10-CM

## 2020-01-26 DIAGNOSIS — Z170 Estrogen receptor positive status [ER+]: Secondary | ICD-10-CM

## 2020-01-26 MED ORDER — FULVESTRANT 250 MG/5ML IM SOLN
INTRAMUSCULAR | Status: AC
  Filled 2020-01-26: qty 10

## 2020-01-26 MED ORDER — FULVESTRANT 250 MG/5ML IM SOLN
500.0000 mg | Freq: Once | INTRAMUSCULAR | Status: AC
  Administered 2020-01-26: 500 mg via INTRAMUSCULAR

## 2020-01-26 MED ORDER — DENOSUMAB 120 MG/1.7ML ~~LOC~~ SOLN
SUBCUTANEOUS | Status: AC
  Filled 2020-01-26: qty 1.7

## 2020-01-26 MED ORDER — DENOSUMAB 120 MG/1.7ML ~~LOC~~ SOLN
120.0000 mg | Freq: Once | SUBCUTANEOUS | Status: AC
  Administered 2020-01-26: 120 mg via SUBCUTANEOUS

## 2020-01-26 NOTE — Patient Instructions (Signed)
Denosumab injection What is this medicine? DENOSUMAB (den oh sue mab) slows bone breakdown. Prolia is used to treat osteoporosis in women after menopause and in men, and in people who are taking corticosteroids for 6 months or more. Xgeva is used to treat a high calcium level due to cancer and to prevent bone fractures and other bone problems caused by multiple myeloma or cancer bone metastases. Xgeva is also used to treat giant cell tumor of the bone. This medicine may be used for other purposes; ask your health care provider or pharmacist if you have questions. COMMON BRAND NAME(S): Prolia, XGEVA What should I tell my health care provider before I take this medicine? They need to know if you have any of these conditions:  dental disease  having surgery or tooth extraction  infection  kidney disease  low levels of calcium or Vitamin D in the blood  malnutrition  on hemodialysis  skin conditions or sensitivity  thyroid or parathyroid disease  an unusual reaction to denosumab, other medicines, foods, dyes, or preservatives  pregnant or trying to get pregnant  breast-feeding How should I use this medicine? This medicine is for injection under the skin. It is given by a health care professional in a hospital or clinic setting. A special MedGuide will be given to you before each treatment. Be sure to read this information carefully each time. For Prolia, talk to your pediatrician regarding the use of this medicine in children. Special care may be needed. For Xgeva, talk to your pediatrician regarding the use of this medicine in children. While this drug may be prescribed for children as young as 13 years for selected conditions, precautions do apply. Overdosage: If you think you have taken too much of this medicine contact a poison control center or emergency room at once. NOTE: This medicine is only for you. Do not share this medicine with others. What if I miss a dose? It is  important not to miss your dose. Call your doctor or health care professional if you are unable to keep an appointment. What may interact with this medicine? Do not take this medicine with any of the following medications:  other medicines containing denosumab This medicine may also interact with the following medications:  medicines that lower your chance of fighting infection  steroid medicines like prednisone or cortisone This list may not describe all possible interactions. Give your health care provider a list of all the medicines, herbs, non-prescription drugs, or dietary supplements you use. Also tell them if you smoke, drink alcohol, or use illegal drugs. Some items may interact with your medicine. What should I watch for while using this medicine? Visit your doctor or health care professional for regular checks on your progress. Your doctor or health care professional may order blood tests and other tests to see how you are doing. Call your doctor or health care professional for advice if you get a fever, chills or sore throat, or other symptoms of a cold or flu. Do not treat yourself. This drug may decrease your body's ability to fight infection. Try to avoid being around people who are sick. You should make sure you get enough calcium and vitamin D while you are taking this medicine, unless your doctor tells you not to. Discuss the foods you eat and the vitamins you take with your health care professional. See your dentist regularly. Brush and floss your teeth as directed. Before you have any dental work done, tell your dentist you are   receiving this medicine. Do not become pregnant while taking this medicine or for 5 months after stopping it. Talk with your doctor or health care professional about your birth control options while taking this medicine. Women should inform their doctor if they wish to become pregnant or think they might be pregnant. There is a potential for serious side  effects to an unborn child. Talk to your health care professional or pharmacist for more information. What side effects may I notice from receiving this medicine? Side effects that you should report to your doctor or health care professional as soon as possible:  allergic reactions like skin rash, itching or hives, swelling of the face, lips, or tongue  bone pain  breathing problems  dizziness  jaw pain, especially after dental work  redness, blistering, peeling of the skin  signs and symptoms of infection like fever or chills; cough; sore throat; pain or trouble passing urine  signs of low calcium like fast heartbeat, muscle cramps or muscle pain; pain, tingling, numbness in the hands or feet; seizures  unusual bleeding or bruising  unusually weak or tired Side effects that usually do not require medical attention (report to your doctor or health care professional if they continue or are bothersome):  constipation  diarrhea  headache  joint pain  loss of appetite  muscle pain  runny nose  tiredness  upset stomach This list may not describe all possible side effects. Call your doctor for medical advice about side effects. You may report side effects to FDA at 1-800-FDA-1088. Where should I keep my medicine? This medicine is only given in a clinic, doctor's office, or other health care setting and will not be stored at home. NOTE: This sheet is a summary. It may not cover all possible information. If you have questions about this medicine, talk to your doctor, pharmacist, or health care provider.  2020 Elsevier/Gold Standard (2017-06-29 16:10:44) Fulvestrant injection What is this medicine? FULVESTRANT (ful VES trant) blocks the effects of estrogen. It is used to treat breast cancer. This medicine may be used for other purposes; ask your health care provider or pharmacist if you have questions. COMMON BRAND NAME(S): FASLODEX What should I tell my health care  provider before I take this medicine? They need to know if you have any of these conditions:  bleeding disorders  liver disease  low blood counts, like low white cell, platelet, or red cell counts  an unusual or allergic reaction to fulvestrant, other medicines, foods, dyes, or preservatives  pregnant or trying to get pregnant  breast-feeding How should I use this medicine? This medicine is for injection into a muscle. It is usually given by a health care professional in a hospital or clinic setting. Talk to your pediatrician regarding the use of this medicine in children. Special care may be needed. Overdosage: If you think you have taken too much of this medicine contact a poison control center or emergency room at once. NOTE: This medicine is only for you. Do not share this medicine with others. What if I miss a dose? It is important not to miss your dose. Call your doctor or health care professional if you are unable to keep an appointment. What may interact with this medicine?  medicines that treat or prevent blood clots like warfarin, enoxaparin, dalteparin, apixaban, dabigatran, and rivaroxaban This list may not describe all possible interactions. Give your health care provider a list of all the medicines, herbs, non-prescription drugs, or dietary supplements you use.   Also tell them if you smoke, drink alcohol, or use illegal drugs. Some items may interact with your medicine. What should I watch for while using this medicine? Your condition will be monitored carefully while you are receiving this medicine. You will need important blood work done while you are taking this medicine. Do not become pregnant while taking this medicine or for at least 1 year after stopping it. Women of child-bearing potential will need to have a negative pregnancy test before starting this medicine. Women should inform their doctor if they wish to become pregnant or think they might be pregnant. There is  a potential for serious side effects to an unborn child. Men should inform their doctors if they wish to father a child. This medicine may lower sperm counts. Talk to your health care professional or pharmacist for more information. Do not breast-feed an infant while taking this medicine or for 1 year after the last dose. What side effects may I notice from receiving this medicine? Side effects that you should report to your doctor or health care professional as soon as possible:  allergic reactions like skin rash, itching or hives, swelling of the face, lips, or tongue  feeling faint or lightheaded, falls  pain, tingling, numbness, or weakness in the legs  signs and symptoms of infection like fever or chills; cough; flu-like symptoms; sore throat  vaginal bleeding Side effects that usually do not require medical attention (report to your doctor or health care professional if they continue or are bothersome):  aches, pains  constipation  diarrhea  headache  hot flashes  nausea, vomiting  pain at site where injected  stomach pain This list may not describe all possible side effects. Call your doctor for medical advice about side effects. You may report side effects to FDA at 1-800-FDA-1088. Where should I keep my medicine? This drug is given in a hospital or clinic and will not be stored at home. NOTE: This sheet is a summary. It may not cover all possible information. If you have questions about this medicine, talk to your doctor, pharmacist, or health care provider.  2020 Elsevier/Gold Standard (2017-05-31 11:34:41)  

## 2020-01-26 NOTE — Progress Notes (Signed)
Per MD proceed with current BP for injections

## 2020-02-02 ENCOUNTER — Other Ambulatory Visit: Payer: Self-pay | Admitting: Hematology and Oncology

## 2020-02-02 DIAGNOSIS — C50412 Malignant neoplasm of upper-outer quadrant of left female breast: Secondary | ICD-10-CM

## 2020-02-02 DIAGNOSIS — Z170 Estrogen receptor positive status [ER+]: Secondary | ICD-10-CM

## 2020-02-09 ENCOUNTER — Other Ambulatory Visit: Payer: Self-pay

## 2020-02-09 ENCOUNTER — Inpatient Hospital Stay: Payer: BC Managed Care – PPO | Attending: Hematology and Oncology

## 2020-02-09 DIAGNOSIS — Z5111 Encounter for antineoplastic chemotherapy: Secondary | ICD-10-CM | POA: Diagnosis present

## 2020-02-09 DIAGNOSIS — C50412 Malignant neoplasm of upper-outer quadrant of left female breast: Secondary | ICD-10-CM | POA: Insufficient documentation

## 2020-02-09 DIAGNOSIS — Z9221 Personal history of antineoplastic chemotherapy: Secondary | ICD-10-CM | POA: Insufficient documentation

## 2020-02-09 DIAGNOSIS — C7951 Secondary malignant neoplasm of bone: Secondary | ICD-10-CM | POA: Diagnosis not present

## 2020-02-09 DIAGNOSIS — Z79899 Other long term (current) drug therapy: Secondary | ICD-10-CM | POA: Insufficient documentation

## 2020-02-09 DIAGNOSIS — C7802 Secondary malignant neoplasm of left lung: Secondary | ICD-10-CM | POA: Insufficient documentation

## 2020-02-09 DIAGNOSIS — Z170 Estrogen receptor positive status [ER+]: Secondary | ICD-10-CM | POA: Diagnosis not present

## 2020-02-09 DIAGNOSIS — C787 Secondary malignant neoplasm of liver and intrahepatic bile duct: Secondary | ICD-10-CM | POA: Diagnosis not present

## 2020-02-09 DIAGNOSIS — Z95828 Presence of other vascular implants and grafts: Secondary | ICD-10-CM

## 2020-02-09 MED ORDER — FULVESTRANT 250 MG/5ML IM SOLN
INTRAMUSCULAR | Status: AC
  Filled 2020-02-09: qty 10

## 2020-02-09 MED ORDER — FULVESTRANT 250 MG/5ML IM SOLN
500.0000 mg | Freq: Once | INTRAMUSCULAR | Status: AC
  Administered 2020-02-09: 500 mg via INTRAMUSCULAR

## 2020-02-09 NOTE — Patient Instructions (Signed)
Fulvestrant injection What is this medicine? FULVESTRANT (ful VES trant) blocks the effects of estrogen. It is used to treat breast cancer. This medicine may be used for other purposes; ask your health care provider or pharmacist if you have questions. COMMON BRAND NAME(S): FASLODEX What should I tell my health care provider before I take this medicine? They need to know if you have any of these conditions:  bleeding disorders  liver disease  low blood counts, like low white cell, platelet, or red cell counts  an unusual or allergic reaction to fulvestrant, other medicines, foods, dyes, or preservatives  pregnant or trying to get pregnant  breast-feeding How should I use this medicine? This medicine is for injection into a muscle. It is usually given by a health care professional in a hospital or clinic setting. Talk to your pediatrician regarding the use of this medicine in children. Special care may be needed. Overdosage: If you think you have taken too much of this medicine contact a poison control center or emergency room at once. NOTE: This medicine is only for you. Do not share this medicine with others. What if I miss a dose? It is important not to miss your dose. Call your doctor or health care professional if you are unable to keep an appointment. What may interact with this medicine?  medicines that treat or prevent blood clots like warfarin, enoxaparin, dalteparin, apixaban, dabigatran, and rivaroxaban This list may not describe all possible interactions. Give your health care provider a list of all the medicines, herbs, non-prescription drugs, or dietary supplements you use. Also tell them if you smoke, drink alcohol, or use illegal drugs. Some items may interact with your medicine. What should I watch for while using this medicine? Your condition will be monitored carefully while you are receiving this medicine. You will need important blood work done while you are taking  this medicine. Do not become pregnant while taking this medicine or for at least 1 year after stopping it. Women of child-bearing potential will need to have a negative pregnancy test before starting this medicine. Women should inform their doctor if they wish to become pregnant or think they might be pregnant. There is a potential for serious side effects to an unborn child. Men should inform their doctors if they wish to father a child. This medicine may lower sperm counts. Talk to your health care professional or pharmacist for more information. Do not breast-feed an infant while taking this medicine or for 1 year after the last dose. What side effects may I notice from receiving this medicine? Side effects that you should report to your doctor or health care professional as soon as possible:  allergic reactions like skin rash, itching or hives, swelling of the face, lips, or tongue  feeling faint or lightheaded, falls  pain, tingling, numbness, or weakness in the legs  signs and symptoms of infection like fever or chills; cough; flu-like symptoms; sore throat  vaginal bleeding Side effects that usually do not require medical attention (report to your doctor or health care professional if they continue or are bothersome):  aches, pains  constipation  diarrhea  headache  hot flashes  nausea, vomiting  pain at site where injected  stomach pain This list may not describe all possible side effects. Call your doctor for medical advice about side effects. You may report side effects to FDA at 1-800-FDA-1088. Where should I keep my medicine? This drug is given in a hospital or clinic and will   not be stored at home. NOTE: This sheet is a summary. It may not cover all possible information. If you have questions about this medicine, talk to your doctor, pharmacist, or health care provider.  2020 Elsevier/Gold Standard (2017-05-31 11:34:41)  

## 2020-02-16 LAB — GUARDANT 360

## 2020-02-23 ENCOUNTER — Encounter: Payer: Self-pay | Admitting: Hematology and Oncology

## 2020-02-24 ENCOUNTER — Other Ambulatory Visit: Payer: BC Managed Care – PPO

## 2020-02-24 ENCOUNTER — Ambulatory Visit: Payer: BC Managed Care – PPO

## 2020-02-24 ENCOUNTER — Ambulatory Visit: Payer: BC Managed Care – PPO | Admitting: Hematology and Oncology

## 2020-03-08 ENCOUNTER — Ambulatory Visit: Payer: BC Managed Care – PPO

## 2020-03-09 ENCOUNTER — Encounter: Payer: Self-pay | Admitting: Hematology and Oncology

## 2020-03-10 ENCOUNTER — Inpatient Hospital Stay: Payer: BC Managed Care – PPO

## 2020-03-10 ENCOUNTER — Inpatient Hospital Stay: Payer: BC Managed Care – PPO | Admitting: Hematology and Oncology

## 2020-03-12 ENCOUNTER — Encounter: Payer: Self-pay | Admitting: Hematology and Oncology

## 2020-03-12 ENCOUNTER — Other Ambulatory Visit: Payer: Self-pay | Admitting: Hematology and Oncology

## 2020-03-17 NOTE — Progress Notes (Signed)
Patient Care Team: [name], MD as PCP - General (Family Medicine) [name], MD as Consulting Physician (Hematology and Oncology) [name], MD as Consulting Physician (Radiation Oncology) [name], MD as Consulting Physician (General Surgery)  DIAGNOSIS:    ICD-10-CM   1. Malignant neoplasm of upper-outer quadrant of left breast in female, estrogen receptor positive ([place])  C50.412    Z17.0     SUMMARY OF ONCOLOGIC HISTORY: Oncology History  Malignant neoplasm of upper-outer quadrant of left breast in female, estrogen receptor positive ([place])  05/13/2018 Initial Diagnosis   Evaluation of 5 months of thickening of the left breast upper outer quadrant.  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  Several lymph nodes were identified by ultrasound.  T2 N1 stage IIb clinical stage   05/16/2018 Cancer Staging   Staging form: Breast, AJCC 8th Edition - Clinical stage from 05/16/2018: Stage IIB (cT2, cN1, cM0, G3, ER+, PR+, HER2-) - Signed by [name], MD on 05/16/2018   05/28/2018 - 10/08/2018 Chemotherapy   DOXOrubicin (ADRIAMYCIN) chemo injection 128 mg, 60 mg/m2 = 128 mg, Intravenous,  Once, 4 of 4 cycles. Administration: 128 mg (05/28/2018), 128 mg (06/11/2018), 128 mg (06/25/2018), 128 mg (07/09/2018)  palonosetron (ALOXI) injection 0.25 mg, 0.25 mg, Intravenous,  Once, 9 of 9 cycles. Administration: 0.25 mg (05/28/2018), 0.25 mg (06/11/2018), 0.25 mg (06/25/2018), 0.25 mg (07/09/2018), 0.25 mg (09/10/2018), 0.25 mg (09/17/2018), 0.25 mg (09/24/2018), 0.25 mg (10/01/2018), 0.25 mg (10/08/2018)  pegfilgrastim (NEULASTA ONPRO KIT) injection 6 mg, 6 mg, Subcutaneous, Once, 4 of 4 cycles. Administration: 6 mg (05/28/2018), 6 mg (06/11/2018), 6  mg (06/25/2018), 6 mg (07/09/2018)  cyclophosphamide (CYTOXAN) 1,280 mg in sodium chloride 0.9 % 250 mL chemo infusion, 600 mg/m2 = 1,280 mg, Intravenous,  Once, 4 of 4 cycles. Administration: 1,280 mg (05/28/2018), 1,280 mg (06/11/2018), 1,280 mg (06/25/2018), 1,280 mg (07/09/2018)  PACLitaxel (TAXOL) 168 mg in sodium chloride 0.9 % 250 mL chemo infusion (</= 54m/m2), 80 mg/m2 = 168 mg, Intravenous,  Once, 12 of 12 cycles. Administration: 168 mg (07/23/2018), 168 mg (07/30/2018), 168 mg (08/06/2018), 168 mg (08/13/2018), 168 mg (08/20/2018), 168 mg (08/27/2018), 168 mg (09/03/2018), 168 mg (09/10/2018), 168 mg (09/17/2018), 168 mg (09/24/2018), 168 mg (10/01/2018), 168 mg (10/08/2018)  fosaprepitant (EMEND) 150 mg, dexamethasone (DECADRON) 12 mg in sodium chloride 0.9 % 145 mL IVPB, , Intravenous,  Once, 4 of 4 cycles. Administration:  (05/28/2018),  (06/11/2018),  (06/25/2018),  (07/09/2018)    08/16/2018 Genetic Testing   Negative genetic testing on the multi-cancer panel.  The Multi-Gene Panel offered by [facility] includes sequencing and/or deletion duplication testing of the following 85 genes: AIP, ALK, APC, ATM, AXIN2,BAP1,  BARD1, BLM, BMPR1A, BRCA1, BRCA2, BRIP1, CASR, CDC73, CDH1, CDK4, CDKN1B, CDKN1C, CDKN2A (p14ARF), CDKN2A (p16INK4a), CEBPA, CHEK2, CTNNA1, DICER1, DIS3L2, EGFR (c.2369C>T, p.Thr790Met variant only), EPCAM (Deletion/duplication testing only), FH, FLCN, GATA2, GPC3, GREM1 (Promoter region deletion/duplication testing only), HOXB13 (c.251G>A, p.Gly84Glu), HRAS, KIT, MAX, MEN1, MET, MITF (c.952G>A, p.Glu318Lys variant only), MLH1, MSH2, MSH3, MSH6, MUTYH, NBN, NF1, NF2, NTHL1, PALB2, PDGFRA, PHOX2B, PMS2, POLD1, POLE, POT1, PRKAR1A, PTCH1, PTEN, RAD50, RAD51C, RAD51D, RB1, RECQL4, RET, RNF43, RUNX1, SDHAF2, SDHA (sequence changes only), SDHB, SDHC, SDHD, SMAD4, SMARCA4, SMARCB1, SMARCE1, STK11, SUFU, TERC, TERT, TMEM127, TP53, TSC1, TSC2, VHL, WRN and WT1.  The report date is August 16, 2018.   10/07/2018 Breast MRI    Significant improvement of left breast malignancy.  Non-mass enhancement 3.7 cm.  Significant improvement in the left axillary lymph nodes previously there were 6 nodes and currently there was a single lymph node with mild cortical thickening.   11/05/2018 Surgery   Left lumpectomy [name] [phone]): residual invasive carcinoma s/p neoadjuvant treatment, HER-2 - (0), ER+ 100%, PR+ 60%, Ki67 10%, involved inferior and medial margins, and 8/9 left axillary lymph nodes positive for carcinoma.   11/05/2018 Cancer Staging   Staging form: Breast, AJCC 8th Edition - Pathologic stage from 11/05/2018: No Stage Recommended (ypT2, pN2a, cM0, ER+, PR+, HER2-) - Signed by [name], NP on 11/27/2018   11/18/2018 Surgery   Re-excision of left lumpectomy [name] [phone]): residual invasive carcinoma, clear margins.   12/30/2018 - 02/14/2019 Radiation Therapy   The patient initially received a dose of 50.4 Gy in 28 fractions to the breast using whole-breast tangent fields. This was delivered using a 3-D conformal technique. The patient also received a dose of 50.4 Gy in 28 fraction to the left axilla. The pt received a boost delivering an additional 12 Gy in 6 fractions using a electron boost with 43mV electrons. The total dose was 112.8 Gy.    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 with above-mentioned history of metastatic breast cancer currently on treatment with Ibrance, Faslodex, and letrozole. She presents to the clinic todayfor a toxicity check.  She is tolerating Faslodex and  Xgeva extremely well.  She has no new side effects to [place]  Denies any fevers or chills.  Her husband was diagnosed with COVID recently.  She has managed to avoid getting infection.  ALLERGIES:  has No Known Allergies.  MEDICATIONS:  Current Outpatient Medications  Medication Sig Dispense Refill  . ALPRAZolam (XANAX) 0.5 MG tablet Take 1 tablet (0.5 mg total) by mouth at bedtime as needed for anxiety. 60 tablet 3  . Biotin 10 MG CAPS Take by mouth.    . calcium carbonate (TUMS - DOSED IN MG ELEMENTAL CALCIUM) 500 MG chewable tablet Chew 1 tablet by mouth as needed for indigestion or heartburn.     .Leslee Home125 MG tablet TAKE 1 TABLET BY MOUTH ONCE DAILY WITH OR WITHOUT FOOD  FOR 21 DAYS, FOLLOWED BY 7  DAYS OFF, REPEATED EVERY 28 DAYS 21 tablet 2  . letrozole (FEMARA) 2.5 MG tablet Take 1 tablet (2.5 mg total) by mouth daily. 90 tablet 3  . Multiple Vitamins-Minerals (WOMENS MULTI VITAMIN & MINERAL) TABS Take by mouth.     No current facility-administered medications for this visit.    PHYSICAL EXAMINATION: ECOG PERFORMANCE STATUS: 1 - Symptomatic but completely ambulatory  Vitals:   03/18/20 0926  BP: (!) 157/111  Pulse: (!) 116  Resp: 17  Temp: (!) 97.5 F (36.4 C)  SpO2: 100%   Filed Weights   03/18/20 0926  Weight: 207 lb 11.2 oz (94.2 kg)    LABORATORY DATA:  I have reviewed the data as listed CMP Latest Ref Rng & Units 01/20/2020 09/30/2019 06/24/2019  Glucose 70 - 99  mg/dL 103(H) 96 105(H)  BUN 6 - 20 mg/dL _0 Creatinine 0.44 - 1.00 mg/dL 0.78 0.78 0.85  Sodium 135 - 145 mmol/L 137 136 140  Potassium 3.5 - 5.1 mmol/L 3.8 3.7 4.1  Chloride 98 - 111 mmol/L 102 103 104  CO2 22 - 32 mmol/L _1 Calcium 8.9 - 10.3 mg/dL 9.7 10.1 9.1  Total Protein 6.5 - 8.1 g/dL 8.0 8.0 7.4  Total Bilirubin 0.3 - 1.2 mg/dL 0.7 0.8 0.5  Alkaline Phos 38 - 126 U/L 77 56 76  AST 15 - 41 U/L _2 ALT 0 - 44 U/L 24 32 24    Lab Results  Component Value Date   WBC  2.8 (L) 03/18/2020   HGB 12.1 03/18/2020   HCT 34.1 (L) 03/18/2020   MCV 107.2 (H) 03/18/2020   PLT 310 03/18/2020   NEUTROABS PENDING 03/18/2020    ASSESSMENT & PLAN:  Malignant neoplasm of upper-outer quadrant of left breast in female, estrogen receptor positive ([place]) 05/13/2018:Evaluation of 5 months of thickening of the left breast upper outer quadrant. 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 Several lymph nodes were identified by ultrasound. T2 N1 stage IIb clinical stage  Treatment plan: 1.Neoadjuvant chemotherapy with dose dense Adriamycin and Cytoxan followed by Taxol weekly x12completed 10/08/2018 2.11/05/2018:Left lumpectomy [name]: residual invasive carcinoma s/p neoadjuvant treatment, HER-2 - (0), ER+ 100%, PR+ 60%, Ki67 10%, involved inferior and medial margins, and 8/9 left axillary lymph nodes positive for carcinoma. Resection of margins 11/18/2018: Residual cancer was identified in the left inferior margin and the final margins are clear. 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 ------------------------------------------------------------------------------------------------------------------------------------------------------  Current Treatment:Ibrance along with letrozolestarted 02/24/19   Ibrance toxicities:Denies any adverse effects to [place]. 1.Leukopenia: ANC2.2so we will continue the dosage at 125 mg. 2.Elevated AST and ALT: Most likely related to [place].These results  are normal today.  PET/CT 01/19/2020: Progressive findings with new metastatic lesions in the liver and skeleton. Dominant lesion SUV 8.3 measuring 1.3 cm. Porta hepatis lymph node measuring 0.9 cm SUV 7.2, celiac lymph node 0.7 cm SUV 5.6, multiple new small bone lesions sacrum, right iliac bone, L2, L1, T3 vertebral body. Right pubic bone SUV 8.5  Treatment plan: 1.  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 added Faslodex 01/26/2020  I discussed with her about biopsying the liver but she is not very keen on it.  If the Faslodex does not control her cancer then we will biopsy.  Our plan is to obtain a CT of the chest abdomen and pelvis in 1 month and follow-up after that.  Bone metastasis: Xgeva every 3 months    No orders of the defined types were placed in this encounter.  The patient has a good understanding of the overall plan. she agrees with it. she will call with any problems that may develop before the next visit here.  Total time spent: 30 mins including face to face time and time spent for planning, charting and coordination of care  [name], MD 03/18/2020  I, Cloyde Reams Dorshimer, am acting as scribe for Dr. [name].  I  have reviewed the above documentation for accuracy and completeness, and I agree with the above.

## 2020-03-18 ENCOUNTER — Inpatient Hospital Stay: Payer: BC Managed Care – PPO | Admitting: Hematology and Oncology

## 2020-03-18 ENCOUNTER — Other Ambulatory Visit: Payer: Self-pay

## 2020-03-18 ENCOUNTER — Inpatient Hospital Stay: Payer: BC Managed Care – PPO | Attending: Hematology and Oncology

## 2020-03-18 ENCOUNTER — Inpatient Hospital Stay: Payer: BC Managed Care – PPO

## 2020-03-18 VITALS — BP 157/111 | HR 116 | Temp 97.5°F | Resp 17 | Ht 68.0 in | Wt 207.7 lb

## 2020-03-18 DIAGNOSIS — C7951 Secondary malignant neoplasm of bone: Secondary | ICD-10-CM | POA: Insufficient documentation

## 2020-03-18 DIAGNOSIS — D72819 Decreased white blood cell count, unspecified: Secondary | ICD-10-CM | POA: Diagnosis not present

## 2020-03-18 DIAGNOSIS — Z79899 Other long term (current) drug therapy: Secondary | ICD-10-CM | POA: Diagnosis not present

## 2020-03-18 DIAGNOSIS — R7401 Elevation of levels of liver transaminase levels: Secondary | ICD-10-CM | POA: Insufficient documentation

## 2020-03-18 DIAGNOSIS — C50919 Malignant neoplasm of unspecified site of unspecified female breast: Secondary | ICD-10-CM | POA: Diagnosis not present

## 2020-03-18 DIAGNOSIS — C50412 Malignant neoplasm of upper-outer quadrant of left female breast: Secondary | ICD-10-CM | POA: Insufficient documentation

## 2020-03-18 DIAGNOSIS — Z5111 Encounter for antineoplastic chemotherapy: Secondary | ICD-10-CM | POA: Diagnosis present

## 2020-03-18 DIAGNOSIS — C787 Secondary malignant neoplasm of liver and intrahepatic bile duct: Secondary | ICD-10-CM | POA: Diagnosis not present

## 2020-03-18 DIAGNOSIS — C778 Secondary and unspecified malignant neoplasm of lymph nodes of multiple regions: Secondary | ICD-10-CM | POA: Insufficient documentation

## 2020-03-18 DIAGNOSIS — Z170 Estrogen receptor positive status [ER+]: Secondary | ICD-10-CM

## 2020-03-18 DIAGNOSIS — Z95828 Presence of other vascular implants and grafts: Secondary | ICD-10-CM

## 2020-03-18 LAB — CBC WITH DIFFERENTIAL (CANCER CENTER ONLY)
Abs Immature Granulocytes: 0.01 10*3/uL (ref 0.00–0.07)
Basophils Absolute: 0.1 10*3/uL (ref 0.0–0.1)
Basophils Relative: 3 %
Eosinophils Absolute: 0 10*3/uL (ref 0.0–0.5)
Eosinophils Relative: 1 %
HCT: 34.1 % — ABNORMAL LOW (ref 36.0–46.0)
Hemoglobin: 12.1 g/dL (ref 12.0–15.0)
Immature Granulocytes: 0 %
Lymphocytes Relative: 23 %
Lymphs Abs: 0.6 10*3/uL — ABNORMAL LOW (ref 0.7–4.0)
MCH: 38.1 pg — ABNORMAL HIGH (ref 26.0–34.0)
MCHC: 35.5 g/dL (ref 30.0–36.0)
MCV: 107.2 fL — ABNORMAL HIGH (ref 80.0–100.0)
Monocytes Absolute: 0.2 10*3/uL (ref 0.1–1.0)
Monocytes Relative: 6 %
Neutro Abs: 1.9 10*3/uL (ref 1.7–7.7)
Neutrophils Relative %: 67 %
Platelet Count: 310 10*3/uL (ref 150–400)
RBC: 3.18 MIL/uL — ABNORMAL LOW (ref 3.87–5.11)
RDW: 12.1 % (ref 11.5–15.5)
WBC Count: 2.8 10*3/uL — ABNORMAL LOW (ref 4.0–10.5)
nRBC: 0 % (ref 0.0–0.2)

## 2020-03-18 LAB — CMP (CANCER CENTER ONLY)
ALT: 32 U/L (ref 0–44)
AST: 27 U/L (ref 15–41)
Albumin: 4 g/dL (ref 3.5–5.0)
Alkaline Phosphatase: 62 U/L (ref 38–126)
Anion gap: 8 (ref 5–15)
BUN: 11 mg/dL (ref 6–20)
CO2: 25 mmol/L (ref 22–32)
Calcium: 9 mg/dL (ref 8.9–10.3)
Chloride: 105 mmol/L (ref 98–111)
Creatinine: 0.77 mg/dL (ref 0.44–1.00)
GFR, Estimated: >60 mL/min (ref >60)
Glucose, Bld: 103 mg/dL — ABNORMAL HIGH (ref 70–99)
Potassium: 3.6 mmol/L (ref 3.5–5.1)
Sodium: 138 mmol/L (ref 135–145)
Total Bilirubin: 0.7 mg/dL (ref 0.3–1.2)
Total Protein: 7.8 g/dL (ref 6.5–8.1)

## 2020-03-18 MED ORDER — FULVESTRANT 250 MG/5ML IM SOLN
INTRAMUSCULAR | Status: AC
  Filled 2020-03-18: qty 10

## 2020-03-18 MED ORDER — FULVESTRANT 250 MG/5ML IM SOLN
500.0000 mg | Freq: Once | INTRAMUSCULAR | Status: AC
  Administered 2020-03-18: 500 mg via INTRAMUSCULAR

## 2020-03-18 MED ORDER — DENOSUMAB 120 MG/1.7ML ~~LOC~~ SOLN: 120.0000 mg | Freq: Once | SUBCUTANEOUS | Status: DC

## 2020-03-18 NOTE — Patient Instructions (Signed)
Fulvestrant injection What is this medicine? FULVESTRANT (ful VES trant) blocks the effects of estrogen. It is used to treat breast cancer. This medicine may be used for other purposes; ask your health care provider or pharmacist if you have questions. COMMON BRAND NAME(S): FASLODEX What should I tell my health care provider before I take this medicine? They need to know if you have any of these conditions:  bleeding disorders  liver disease  low blood counts, like low white cell, platelet, or red cell counts  an unusual or allergic reaction to fulvestrant, other medicines, foods, dyes, or preservatives  pregnant or trying to get pregnant  breast-feeding How should I use this medicine? This medicine is for injection into a muscle. It is usually given by a health care professional in a hospital or clinic setting. Talk to your pediatrician regarding the use of this medicine in children. Special care may be needed. Overdosage: If you think you have taken too much of this medicine contact a poison control center or emergency room at once. NOTE: This medicine is only for you. Do not share this medicine with others. What if I miss a dose? It is important not to miss your dose. Call your doctor or health care professional if you are unable to keep an appointment. What may interact with this medicine?  medicines that treat or prevent blood clots like warfarin, enoxaparin, dalteparin, apixaban, dabigatran, and rivaroxaban This list may not describe all possible interactions. Give your health care provider a list of all the medicines, herbs, non-prescription drugs, or dietary supplements you use. Also tell them if you smoke, drink alcohol, or use illegal drugs. Some items may interact with your medicine. What should I watch for while using this medicine? Your condition will be monitored carefully while you are receiving this medicine. You will need important blood work done while you are taking  this medicine. Do not become pregnant while taking this medicine or for at least 1 year after stopping it. Women of child-bearing potential will need to have a negative pregnancy test before starting this medicine. Women should inform their doctor if they wish to become pregnant or think they might be pregnant. There is a potential for serious side effects to an unborn child. Men should inform their doctors if they wish to father a child. This medicine may lower sperm counts. Talk to your health care professional or pharmacist for more information. Do not breast-feed an infant while taking this medicine or for 1 year after the last dose. What side effects may I notice from receiving this medicine? Side effects that you should report to your doctor or health care professional as soon as possible:  allergic reactions like skin rash, itching or hives, swelling of the face, lips, or tongue  feeling faint or lightheaded, falls  pain, tingling, numbness, or weakness in the legs  signs and symptoms of infection like fever or chills; cough; flu-like symptoms; sore throat  vaginal bleeding Side effects that usually do not require medical attention (report to your doctor or health care professional if they continue or are bothersome):  aches, pains  constipation  diarrhea  headache  hot flashes  nausea, vomiting  pain at site where injected  stomach pain This list may not describe all possible side effects. Call your doctor for medical advice about side effects. You may report side effects to FDA at 1-800-FDA-1088. Where should I keep my medicine? This drug is given in a hospital or clinic and will   not be stored at home. NOTE: This sheet is a summary. It may not cover all possible information. If you have questions about this medicine, talk to your doctor, pharmacist, or health care provider.  2021 Elsevier/Gold Standard (2017-05-31 11:34:41)  

## 2020-03-18 NOTE — Assessment & Plan Note (Signed)
05/13/2018:Evaluation of 5 months of thickening of the left breast upper outer quadrant. 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 Several lymph nodes were identified by ultrasound. T2 N1 stage IIb clinical stage  Treatment plan: 1.Neoadjuvant chemotherapy with dose dense Adriamycin and Cytoxan followed by Taxol weekly x12completed 10/08/2018 2.11/05/2018:Left lumpectomy [name]: residual invasive carcinoma s/p neoadjuvant treatment, HER-2 - (0), ER+ 100%, PR+ 60%, Ki67 10%, involved inferior and medial margins, and 8/9 left axillary lymph nodes positive for carcinoma. Resection of margins 11/18/2018: Residual cancer was identified in the left inferior margin and the final margins are clear. 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 ------------------------------------------------------------------------------------------------------------------------------------------------------  Current Treatment:Ibrance along with letrozolestarted 02/24/19   Ibrance toxicities:Denies any adverse effects to [name]. 1.Leukopenia: ANC2.2so we will continue the dosage at 125 mg. 2.Elevated AST and ALT: Most likely related to [name].These results are normal today.  PET/CT 01/19/2020: Progressive findings with new metastatic lesions in the liver and skeleton. Dominant lesion SUV 8.3 measuring 1.3 cm. Porta hepatis lymph node measuring 0.9 cm SUV 7.2, celiac lymph node 0.7 cm SUV 5.6, multiple new small bone lesions  sacrum, right iliac bone, L2, L1, T3 vertebral body. Right pubic bone SUV 8.5  Treatment plan: 1.  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 add Faslodex to the treatment plan started 01/26/2020  I discussed with her about biopsying the liver but she is not very keen on it.  If the Faslodex does not control her cancer then we will biopsy.  Our plan is to obtain a CT of the chest abdomen and pelvis in 1 month and follow-up after that.  I also recommended Xgeva.  This will be given every 3 months.

## 2020-03-23 ENCOUNTER — Ambulatory Visit: Payer: BC Managed Care – PPO

## 2020-04-16 ENCOUNTER — Other Ambulatory Visit: Payer: Self-pay | Admitting: *Deleted

## 2020-04-16 DIAGNOSIS — C50412 Malignant neoplasm of upper-outer quadrant of left female breast: Secondary | ICD-10-CM

## 2020-04-16 DIAGNOSIS — Z170 Estrogen receptor positive status [ER+]: Secondary | ICD-10-CM

## 2020-04-19 ENCOUNTER — Encounter: Payer: Self-pay | Admitting: Hematology and Oncology

## 2020-04-20 ENCOUNTER — Other Ambulatory Visit: Payer: Self-pay

## 2020-04-20 ENCOUNTER — Inpatient Hospital Stay: Payer: BC Managed Care – PPO | Attending: Hematology and Oncology

## 2020-04-20 ENCOUNTER — Inpatient Hospital Stay: Payer: BC Managed Care – PPO

## 2020-04-20 VITALS — BP 145/111 | HR 133 | Temp 98.4°F | Resp 20

## 2020-04-20 DIAGNOSIS — C50412 Malignant neoplasm of upper-outer quadrant of left female breast: Secondary | ICD-10-CM | POA: Diagnosis not present

## 2020-04-20 DIAGNOSIS — R7401 Elevation of levels of liver transaminase levels: Secondary | ICD-10-CM | POA: Diagnosis not present

## 2020-04-20 DIAGNOSIS — Z170 Estrogen receptor positive status [ER+]: Secondary | ICD-10-CM | POA: Diagnosis not present

## 2020-04-20 DIAGNOSIS — Z5111 Encounter for antineoplastic chemotherapy: Secondary | ICD-10-CM | POA: Insufficient documentation

## 2020-04-20 DIAGNOSIS — Z79899 Other long term (current) drug therapy: Secondary | ICD-10-CM | POA: Insufficient documentation

## 2020-04-20 DIAGNOSIS — Z95828 Presence of other vascular implants and grafts: Secondary | ICD-10-CM

## 2020-04-20 DIAGNOSIS — D72819 Decreased white blood cell count, unspecified: Secondary | ICD-10-CM | POA: Insufficient documentation

## 2020-04-20 LAB — CBC WITH DIFFERENTIAL (CANCER CENTER ONLY)
Abs Immature Granulocytes: 0 10*3/uL (ref 0.00–0.07)
Basophils Absolute: 0.1 10*3/uL (ref 0.0–0.1)
Basophils Relative: 2 %
Eosinophils Absolute: 0.1 10*3/uL (ref 0.0–0.5)
Eosinophils Relative: 2 %
HCT: 36.3 % (ref 36.0–46.0)
Hemoglobin: 12.8 g/dL (ref 12.0–15.0)
Immature Granulocytes: 0 %
Lymphocytes Relative: 20 %
Lymphs Abs: 0.6 10*3/uL — ABNORMAL LOW (ref 0.7–4.0)
MCH: 38 pg — ABNORMAL HIGH (ref 26.0–34.0)
MCHC: 35.3 g/dL (ref 30.0–36.0)
MCV: 107.7 fL — ABNORMAL HIGH (ref 80.0–100.0)
Monocytes Absolute: 0.2 10*3/uL (ref 0.1–1.0)
Monocytes Relative: 8 %
Neutro Abs: 1.9 10*3/uL (ref 1.7–7.7)
Neutrophils Relative %: 68 %
Platelet Count: 226 10*3/uL (ref 150–400)
RBC: 3.37 MIL/uL — ABNORMAL LOW (ref 3.87–5.11)
RDW: 11.9 % (ref 11.5–15.5)
WBC Count: 2.7 10*3/uL — ABNORMAL LOW (ref 4.0–10.5)
nRBC: 0 % (ref 0.0–0.2)

## 2020-04-20 LAB — CMP (CANCER CENTER ONLY)
ALT: 49 U/L — ABNORMAL HIGH (ref 0–44)
AST: 37 U/L (ref 15–41)
Albumin: 4.3 g/dL (ref 3.5–5.0)
Alkaline Phosphatase: 61 U/L (ref 38–126)
Anion gap: 12 (ref 5–15)
BUN: 12 mg/dL (ref 6–20)
CO2: 23 mmol/L (ref 22–32)
Calcium: 9.8 mg/dL (ref 8.9–10.3)
Chloride: 105 mmol/L (ref 98–111)
Creatinine: 0.79 mg/dL (ref 0.44–1.00)
GFR, Estimated: >60 mL/min (ref >60)
Glucose, Bld: 94 mg/dL (ref 70–99)
Potassium: 4.3 mmol/L (ref 3.5–5.1)
Sodium: 140 mmol/L (ref 135–145)
Total Bilirubin: 0.7 mg/dL (ref 0.3–1.2)
Total Protein: 7.8 g/dL (ref 6.5–8.1)

## 2020-04-20 MED ORDER — DENOSUMAB 120 MG/1.7ML ~~LOC~~ SOLN
120.0000 mg | Freq: Once | SUBCUTANEOUS | Status: AC
  Administered 2020-04-20: 120 mg via SUBCUTANEOUS

## 2020-04-20 MED ORDER — FULVESTRANT 250 MG/5ML IM SOLN
INTRAMUSCULAR | Status: AC
  Filled 2020-04-20: qty 10

## 2020-04-20 MED ORDER — FULVESTRANT 250 MG/5ML IM SOLN
500.0000 mg | Freq: Once | INTRAMUSCULAR | Status: AC
  Administered 2020-04-20: 500 mg via INTRAMUSCULAR

## 2020-04-20 MED ORDER — DENOSUMAB 120 MG/1.7ML ~~LOC~~ SOLN
SUBCUTANEOUS | Status: AC
  Filled 2020-04-20: qty 1.7

## 2020-04-20 NOTE — Patient Instructions (Signed)
Denosumab injection What is this medicine? DENOSUMAB (den oh sue mab) slows bone breakdown. Prolia is used to treat osteoporosis in women after menopause and in men, and in people who are taking corticosteroids for 6 months or more. Xgeva is used to treat a high calcium level due to cancer and to prevent bone fractures and other bone problems caused by multiple myeloma or cancer bone metastases. Xgeva is also used to treat giant cell tumor of the bone. This medicine may be used for other purposes; ask your health care provider or pharmacist if you have questions. COMMON BRAND NAME(S): Prolia, XGEVA What should I tell my health care provider before I take this medicine? They need to know if you have any of these conditions:  dental disease  having surgery or tooth extraction  infection  kidney disease  low levels of calcium or Vitamin D in the blood  malnutrition  on hemodialysis  skin conditions or sensitivity  thyroid or parathyroid disease  an unusual reaction to denosumab, other medicines, foods, dyes, or preservatives  pregnant or trying to get pregnant  breast-feeding How should I use this medicine? This medicine is for injection under the skin. It is given by a health care professional in a hospital or clinic setting. A special MedGuide will be given to you before each treatment. Be sure to read this information carefully each time. For Prolia, talk to your pediatrician regarding the use of this medicine in children. Special care may be needed. For Xgeva, talk to your pediatrician regarding the use of this medicine in children. While this drug may be prescribed for children as young as 13 years for selected conditions, precautions do apply. Overdosage: If you think you have taken too much of this medicine contact a poison control center or emergency room at once. NOTE: This medicine is only for you. Do not share this medicine with others. What if I miss a dose? It is  important not to miss your dose. Call your doctor or health care professional if you are unable to keep an appointment. What may interact with this medicine? Do not take this medicine with any of the following medications:  other medicines containing denosumab This medicine may also interact with the following medications:  medicines that lower your chance of fighting infection  steroid medicines like prednisone or cortisone This list may not describe all possible interactions. Give your health care provider a list of all the medicines, herbs, non-prescription drugs, or dietary supplements you use. Also tell them if you smoke, drink alcohol, or use illegal drugs. Some items may interact with your medicine. What should I watch for while using this medicine? Visit your doctor or health care professional for regular checks on your progress. Your doctor or health care professional may order blood tests and other tests to see how you are doing. Call your doctor or health care professional for advice if you get a fever, chills or sore throat, or other symptoms of a cold or flu. Do not treat yourself. This drug may decrease your body's ability to fight infection. Try to avoid being around people who are sick. You should make sure you get enough calcium and vitamin D while you are taking this medicine, unless your doctor tells you not to. Discuss the foods you eat and the vitamins you take with your health care professional. See your dentist regularly. Brush and floss your teeth as directed. Before you have any dental work done, tell your dentist you are   receiving this medicine. Do not become pregnant while taking this medicine or for 5 months after stopping it. Talk with your doctor or health care professional about your birth control options while taking this medicine. Women should inform their doctor if they wish to become pregnant or think they might be pregnant. There is a potential for serious side  effects to an unborn child. Talk to your health care professional or pharmacist for more information. What side effects may I notice from receiving this medicine? Side effects that you should report to your doctor or health care professional as soon as possible:  allergic reactions like skin rash, itching or hives, swelling of the face, lips, or tongue  bone pain  breathing problems  dizziness  jaw pain, especially after dental work  redness, blistering, peeling of the skin  signs and symptoms of infection like fever or chills; cough; sore throat; pain or trouble passing urine  signs of low calcium like fast heartbeat, muscle cramps or muscle pain; pain, tingling, numbness in the hands or feet; seizures  unusual bleeding or bruising  unusually weak or tired Side effects that usually do not require medical attention (report to your doctor or health care professional if they continue or are bothersome):  constipation  diarrhea  headache  joint pain  loss of appetite  muscle pain  runny nose  tiredness  upset stomach This list may not describe all possible side effects. Call your doctor for medical advice about side effects. You may report side effects to FDA at 1-800-FDA-1088. Where should I keep my medicine? This medicine is only given in a clinic, doctor's office, or other health care setting and will not be stored at home. NOTE: This sheet is a summary. It may not cover all possible information. If you have questions about this medicine, talk to your doctor, pharmacist, or health care provider.  2021 Elsevier/Gold Standard (2017-06-29 16:10:44) Fulvestrant injection What is this medicine? FULVESTRANT (ful VES trant) blocks the effects of estrogen. It is used to treat breast cancer. This medicine may be used for other purposes; ask your health care provider or pharmacist if you have questions. COMMON BRAND NAME(S): FASLODEX What should I tell my health care  provider before I take this medicine? They need to know if you have any of these conditions:  bleeding disorders  liver disease  low blood counts, like low white cell, platelet, or red cell counts  an unusual or allergic reaction to fulvestrant, other medicines, foods, dyes, or preservatives  pregnant or trying to get pregnant  breast-feeding How should I use this medicine? This medicine is for injection into a muscle. It is usually given by a health care professional in a hospital or clinic setting. Talk to your pediatrician regarding the use of this medicine in children. Special care may be needed. Overdosage: If you think you have taken too much of this medicine contact a poison control center or emergency room at once. NOTE: This medicine is only for you. Do not share this medicine with others. What if I miss a dose? It is important not to miss your dose. Call your doctor or health care professional if you are unable to keep an appointment. What may interact with this medicine?  medicines that treat or prevent blood clots like warfarin, enoxaparin, dalteparin, apixaban, dabigatran, and rivaroxaban This list may not describe all possible interactions. Give your health care provider a list of all the medicines, herbs, non-prescription drugs, or dietary supplements you use.   Also tell them if you smoke, drink alcohol, or use illegal drugs. Some items may interact with your medicine. What should I watch for while using this medicine? Your condition will be monitored carefully while you are receiving this medicine. You will need important blood work done while you are taking this medicine. Do not become pregnant while taking this medicine or for at least 1 year after stopping it. Women of child-bearing potential will need to have a negative pregnancy test before starting this medicine. Women should inform their doctor if they wish to become pregnant or think they might be pregnant. There is  a potential for serious side effects to an unborn child. Men should inform their doctors if they wish to father a child. This medicine may lower sperm counts. Talk to your health care professional or pharmacist for more information. Do not breast-feed an infant while taking this medicine or for 1 year after the last dose. What side effects may I notice from receiving this medicine? Side effects that you should report to your doctor or health care professional as soon as possible:  allergic reactions like skin rash, itching or hives, swelling of the face, lips, or tongue  feeling faint or lightheaded, falls  pain, tingling, numbness, or weakness in the legs  signs and symptoms of infection like fever or chills; cough; flu-like symptoms; sore throat  vaginal bleeding Side effects that usually do not require medical attention (report to your doctor or health care professional if they continue or are bothersome):  aches, pains  constipation  diarrhea  headache  hot flashes  nausea, vomiting  pain at site where injected  stomach pain This list may not describe all possible side effects. Call your doctor for medical advice about side effects. You may report side effects to FDA at 1-800-FDA-1088. Where should I keep my medicine? This drug is given in a hospital or clinic and will not be stored at home. NOTE: This sheet is a summary. It may not cover all possible information. If you have questions about this medicine, talk to your doctor, pharmacist, or health care provider.  2021 Elsevier/Gold Standard (2017-05-31 11:34:41)

## 2020-04-21 ENCOUNTER — Telehealth: Payer: Self-pay | Admitting: Hematology and Oncology

## 2020-04-21 NOTE — Telephone Encounter (Signed)
Scheduled appt per 2/15 sch msg - pt is aware of apt date and time

## 2020-04-27 ENCOUNTER — Ambulatory Visit: Payer: BC Managed Care – PPO

## 2020-05-13 ENCOUNTER — Other Ambulatory Visit: Payer: Self-pay | Admitting: Hematology and Oncology

## 2020-05-13 DIAGNOSIS — C50412 Malignant neoplasm of upper-outer quadrant of left female breast: Secondary | ICD-10-CM

## 2020-05-13 DIAGNOSIS — Z170 Estrogen receptor positive status [ER+]: Secondary | ICD-10-CM

## 2020-05-19 ENCOUNTER — Encounter: Payer: Self-pay | Admitting: Hematology and Oncology

## 2020-05-21 ENCOUNTER — Encounter (HOSPITAL_COMMUNITY): Payer: Self-pay

## 2020-05-21 ENCOUNTER — Other Ambulatory Visit: Payer: Self-pay

## 2020-05-21 ENCOUNTER — Ambulatory Visit (HOSPITAL_COMMUNITY)
Admission: RE | Admit: 2020-05-21 | Discharge: 2020-05-21 | Disposition: A | Discharge status: Home / Self Care | Payer: BC Managed Care – PPO | Source: Ambulatory Visit | Attending: Hematology and Oncology | Admitting: Hematology and Oncology

## 2020-05-21 DIAGNOSIS — C50412 Malignant neoplasm of upper-outer quadrant of left female breast: Secondary | ICD-10-CM | POA: Diagnosis present

## 2020-05-21 DIAGNOSIS — Z170 Estrogen receptor positive status [ER+]: Secondary | ICD-10-CM

## 2020-05-21 DIAGNOSIS — C50919 Malignant neoplasm of unspecified site of unspecified female breast: Secondary | ICD-10-CM | POA: Diagnosis present

## 2020-05-21 MED ORDER — IOHEXOL 300 MG/ML  SOLN
100.0000 mL | Freq: Once | INTRAMUSCULAR | Status: AC | PRN
  Administered 2020-05-21: 100 mL via INTRAVENOUS

## 2020-05-23 NOTE — Progress Notes (Incomplete)
 Patient Care Team: Hunter, Stephen O, MD as PCP - General (Family Medicine) Gudena, Vinay, MD as Consulting Physician (Hematology and Oncology) Kinard, James, MD as Consulting Physician (Radiation Oncology) Wakefield, Matthew, MD as Consulting Physician (General Surgery)  DIAGNOSIS: No diagnosis found.  SUMMARY OF ONCOLOGIC HISTORY: Oncology History  Malignant neoplasm of upper-outer quadrant of left breast in female, estrogen receptor positive (HCC)  05/13/2018 Initial Diagnosis   Evaluation of 5 months of thickening of the left breast upper outer quadrant.  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  Several lymph nodes were identified by ultrasound.  T2 N1 stage IIb clinical stage   05/16/2018 Cancer Staging   Staging form: Breast, AJCC 8th Edition - Clinical stage from 05/16/2018: Stage IIB (cT2, cN1, cM0, G3, ER+, PR+, HER2-) - Signed by Gudena, Vinay, MD on 05/16/2018   05/28/2018 - 10/08/2018 Chemotherapy   DOXOrubicin (ADRIAMYCIN) chemo injection 128 mg, 60 mg/m2 = 128 mg, Intravenous,  Once, 4 of 4 cycles. Administration: 128 mg (05/28/2018), 128 mg (06/11/2018), 128 mg (06/25/2018), 128 mg (07/09/2018)  palonosetron (ALOXI) injection 0.25 mg, 0.25 mg, Intravenous,  Once, 9 of 9 cycles. Administration: 0.25 mg (05/28/2018), 0.25 mg (06/11/2018), 0.25 mg (06/25/2018), 0.25 mg (07/09/2018), 0.25 mg (09/10/2018), 0.25 mg (09/17/2018), 0.25 mg (09/24/2018), 0.25 mg (10/01/2018), 0.25 mg (10/08/2018)  pegfilgrastim (NEULASTA ONPRO KIT) injection 6 mg, 6 mg, Subcutaneous, Once, 4 of 4 cycles. Administration: 6 mg (05/28/2018), 6 mg (06/11/2018), 6 mg (06/25/2018), 6 mg (07/09/2018)  cyclophosphamide (CYTOXAN) 1,280 mg in sodium chloride 0.9 % 250 mL chemo infusion, 600  mg/m2 = 1,280 mg, Intravenous,  Once, 4 of 4 cycles. Administration: 1,280 mg (05/28/2018), 1,280 mg (06/11/2018), 1,280 mg (06/25/2018), 1,280 mg (07/09/2018)  PACLitaxel (TAXOL) 168 mg in sodium chloride 0.9 % 250 mL chemo infusion (</= 80mg/m2), 80 mg/m2 = 168 mg, Intravenous,  Once, 12 of 12 cycles. Administration: 168 mg (07/23/2018), 168 mg (07/30/2018), 168 mg (08/06/2018), 168 mg (08/13/2018), 168 mg (08/20/2018), 168 mg (08/27/2018), 168 mg (09/03/2018), 168 mg (09/10/2018), 168 mg (09/17/2018), 168 mg (09/24/2018), 168 mg (10/01/2018), 168 mg (10/08/2018)  fosaprepitant (EMEND) 150 mg, dexamethasone (DECADRON) 12 mg in sodium chloride 0.9 % 145 mL IVPB, , Intravenous,  Once, 4 of 4 cycles. Administration:  (05/28/2018),  (06/11/2018),  (06/25/2018),  (07/09/2018)    08/16/2018 Genetic Testing   Negative genetic testing on the multi-cancer panel.  The Multi-Gene Panel offered by [facility] includes sequencing and/or deletion duplication testing of the following 85 genes: AIP, ALK, APC, ATM, AXIN2,BAP1,  BARD1, BLM, BMPR1A, BRCA1, BRCA2, BRIP1, CASR, CDC73, CDH1, CDK4, CDKN1B, CDKN1C, CDKN2A (p14ARF), CDKN2A (p16INK4a), CEBPA, CHEK2, CTNNA1, DICER1, DIS3L2, EGFR (c.2369C>T, p.Thr790Met variant only), EPCAM (Deletion/duplication testing only), FH, FLCN, GATA2, GPC3, GREM1 (Promoter region deletion/duplication testing only), HOXB13 (c.251G>A, p.Gly84Glu), HRAS, KIT, MAX, MEN1, MET, MITF (c.952G>A, p.Glu318Lys variant only), MLH1, MSH2, MSH3, MSH6, MUTYH, NBN, NF1, NF2, NTHL1, PALB2, PDGFRA, PHOX2B, PMS2, POLD1, POLE, POT1, PRKAR1A, PTCH1, PTEN, RAD50, RAD51C, RAD51D, RB1, RECQL4, RET, RNF43, RUNX1, SDHAF2, SDHA (sequence changes only), SDHB, SDHC, SDHD, SMAD4, SMARCA4, SMARCB1, SMARCE1, STK11, SUFU, TERC, TERT, TMEM127, TP53, TSC1, TSC2, VHL, WRN and WT1.  The report date is August 16, 2018.   10/07/2018 Breast MRI   Significant improvement of left breast malignancy.  Non-mass enhancement 3.7 cm.    Significant improvement in the left  axillary lymph nodes previously there were 6 nodes and currently there was a single lymph node with mild cortical thickening.   11/05/2018 Surgery   Left lumpectomy [name] [phone]): residual invasive carcinoma s/p neoadjuvant treatment, HER-2 - (0), ER+ 100%, PR+ 60%, Ki67 10%, involved inferior and medial margins, and 8/9 left axillary lymph nodes positive for carcinoma.   11/05/2018 Cancer Staging   Staging form: Breast, AJCC 8th Edition - Pathologic stage from 11/05/2018: No Stage Recommended (ypT2, pN2a, cM0, ER+, PR+, HER2-) - Signed by [name], NP on 11/27/2018   11/18/2018 Surgery   Re-excision of left lumpectomy [name] [phone]): residual invasive carcinoma, clear margins.   12/30/2018 - 02/14/2019 Radiation Therapy   The patient initially received a dose of 50.4 Gy in 28 fractions to the breast using whole-breast tangent fields. This was delivered using a 3-D conformal technique. The patient also received a dose of 50.4 Gy in 28 fraction to the left axilla. The pt received a boost delivering an additional 12 Gy in 6 fractions using a electron boost with 26mV electrons. The total dose was 112.8 Gy.    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 with above-mentioned history of metastatic breast cancer currently on treatment with [name] Home Faslodex, Xgeva, and letrozole.CT CAP on 05/21/20 showed stable osseous metastases, decreased number of hepatic lesions, and no other evidence of metastases. She presents to the clinic todayfor a toxicity check and to review her  scan.  ALLERGIES:  has No Known Allergies.  MEDICATIONS:  Current Outpatient Medications  Medication Sig Dispense Refill  . ALPRAZolam (XANAX) 0.5 MG tablet Take 1 tablet (0.5 mg total) by mouth at bedtime as needed for anxiety. 60 tablet 3  . Biotin 10 MG CAPS Take by mouth.    . calcium carbonate (TUMS - DOSED IN MG ELEMENTAL CALCIUM) 500 MG chewable tablet Chew 1 tablet by mouth as needed for indigestion or heartburn.     .Leslee Home125 MG tablet TAKE 1 TABLET BY MOUTH ONCE DAILY WITH OR WITHOUT FOOD  FOR 21 DAYS, FOLLOWED BY 7  DAYS OFF, REPEATED EVERY 28 DAYS 21 tablet 2  . letrozole (FEMARA) 2.5 MG tablet Take 1 tablet (2.5 mg total) by mouth daily. 90 tablet 3  . Multiple Vitamins-Minerals (WOMENS MULTI VITAMIN & MINERAL) TABS Take by mouth.     No current facility-administered medications for this visit.    PHYSICAL EXAMINATION: ECOG PERFORMANCE STATUS: {CHL ONC ECOG PS:[phone]}  There were no vitals filed for this visit. There were no vitals filed for this visit.   LABORATORY DATA:  I have reviewed the data as listed CMP Latest Ref Rng & Units 04/20/2020 03/18/2020 01/20/2020  Glucose 70 - 99 mg/dL 94 103(H) 103(H)  BUN 6 - 20 mg/dL _0 Creatinine 0.44 - 1.00 mg/dL 0.79 0.77 0.78  Sodium 135 - 145 mmol/L 140 138 137  Potassium 3.5 - 5.1 mmol/L 4.3 3.6 3.8  Chloride 98 - 111 mmol/L 105 105 102  CO2 22 - 32 mmol/L _1 Calcium 8.9 - 10.3 mg/dL 9.8 9.0 9.7  Total Protein 6.5 -  8.1 g/dL 7.8 7.8 8.0  Total Bilirubin 0.3 - 1.2 mg/dL 0.7 0.7 0.7  Alkaline Phos 38 - 126 U/L 61 62 77  AST 15 - 41 U/L 37 27 19  ALT 0 - 44 U/L 49(H) 32 24    Lab Results  Component Value Date   WBC 2.7 (L) 04/20/2020   HGB 12.8 04/20/2020   HCT 36.3 04/20/2020   MCV 107.7 (H) 04/20/2020   PLT 226 04/20/2020   NEUTROABS 1.9 04/20/2020    ASSESSMENT & PLAN:  No problem-specific Assessment & Plan notes found for this encounter.    No orders of the defined types were  placed in this encounter.  The patient has a good understanding of the overall plan. she agrees with it. she will call with any problems that may develop before the next visit here.  Total time spent: *** mins including face to face time and time spent for planning, charting and coordination of care  Vinay K Gudena, MD, MPH 05/23/2020  I, Molly Dorshimer, am acting as scribe for Dr. Vinay Gudena.  {insert scribe attestation}       

## 2020-05-24 ENCOUNTER — Inpatient Hospital Stay: Payer: BC Managed Care – PPO

## 2020-05-24 ENCOUNTER — Inpatient Hospital Stay: Payer: BC Managed Care – PPO | Admitting: Hematology and Oncology

## 2020-05-24 ENCOUNTER — Encounter: Payer: Self-pay | Admitting: Hematology and Oncology

## 2020-05-24 ENCOUNTER — Telehealth: Payer: Self-pay | Admitting: Hematology and Oncology

## 2020-05-24 NOTE — Telephone Encounter (Signed)
Called and spoke with pt. Can not make it to her appt today due to sick children. R/s per her request. Confirmed new date and time

## 2020-05-24 NOTE — Assessment & Plan Note (Deleted)
05/13/2018:Evaluation of 5 months of thickening of the left breast upper outer quadrant. 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 Several lymph nodes were identified by ultrasound. T2 N1 stage IIb clinical stage  Treatment plan: 1.Neoadjuvant chemotherapy with dose dense Adriamycin and Cytoxan followed by Taxol weekly x12completed 10/08/2018 2.11/05/2018:Left lumpectomy [name]: residual invasive carcinoma s/p neoadjuvant treatment, HER-2 - (0), ER+ 100%, PR+ 60%, Ki67 10%, involved inferior and medial margins, and 8/9 left axillary lymph nodes positive for carcinoma. Resection of margins 11/18/2018: Residual cancer was identified in the left inferior margin and the final margins are clear. 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 ------------------------------------------------------------------------------------------------------------------------------------------------------  Current Treatment:Ibrance along with letrozolestarted 02/24/19   Ibrance toxicities:Denies any adverse effects to [place]. 1.Leukopenia: ANC2.2so we will continue the dosage at 125 mg. 2.Elevated AST and ALT: Most likely related to [place].These results are normal today.  PET/CT 01/19/2020: Progressive findings with new metastatic lesions in the liver and skeleton. Dominant lesion SUV 8.3 measuring 1.3 cm. Porta hepatis lymph node measuring 0.9 cm SUV 7.2, celiac lymph node 0.7 cm SUV 5.6, multiple new small bone lesions  sacrum, right iliac bone, L2, L1, T3 vertebral body. Right pubic bone SUV 8.5  PET CT 05/21/20: Widespread bone mets. 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 If the Faslodex does not control her cancer then we will biopsy.   Bone metastasis: Xgeva every 3 months  RTC in 3 months

## 2020-05-28 ENCOUNTER — Other Ambulatory Visit: Payer: Self-pay | Admitting: *Deleted

## 2020-05-28 DIAGNOSIS — Z170 Estrogen receptor positive status [ER+]: Secondary | ICD-10-CM

## 2020-05-28 DIAGNOSIS — C50412 Malignant neoplasm of upper-outer quadrant of left female breast: Secondary | ICD-10-CM

## 2020-05-31 NOTE — Assessment & Plan Note (Signed)
/  11/2018:Evaluation of 5 months of thickening of the left breast upper outer quadrant. 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 Several lymph nodes were identified by ultrasound. T2 N1 stage IIb clinical stage  Treatment plan: 1.Neoadjuvant chemotherapy with dose dense Adriamycin and Cytoxan followed by Taxol weekly x12completed 10/08/2018 2.11/05/2018:Left lumpectomy Bethany Hanna): residual invasive carcinoma s/p neoadjuvant treatment, HER-2 - (0), ER+ 100%, PR+ 60%, Ki67 10%, involved inferior and medial margins, and 8/9 left axillary lymph nodes positive for carcinoma. Resection of margins 11/18/2018: Residual cancer was identified in the left inferior margin and the final margins are clear. 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 ------------------------------------------------------------------------------------------------------------------------------------------------------  Current Treatment:Ibrance along with letrozolestarted 02/24/19   Ibrance toxicities:Denies any adverse effects to [place]. 1.Leukopenia: ANC2.2so we will continue the dosage at 125 mg. 2.Elevated AST and ALT: Most likely related to [place].These results are normal today.  PET/CT 01/19/2020: Progressive findings with new metastatic lesions in the liver and skeleton. Dominant lesion SUV 8.3 measuring 1.3 cm. Porta hepatis lymph node measuring 0.9 cm SUV 7.2, celiac lymph node 0.7 cm SUV 5.6, multiple new small bone lesions  sacrum, right iliac bone, L2, L1, T3 vertebral body. 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 If the Faslodex does not control her cancer then we will biopsy. Our plan is to obtain a CT of the chest abdomen and pelvis in 1 month and follow-up after that.  Bone metastasis: Xgeva every 3 months

## 2020-05-31 NOTE — Progress Notes (Signed)
Patient Care Team: [name], MD as PCP - General (Family Medicine) [name], MD as Consulting Physician (Hematology and Oncology) [name], MD as Consulting Physician (Radiation Oncology) [name], MD as Consulting Physician (General Surgery)  DIAGNOSIS:    ICD-10-CM   1. Malignant neoplasm of upper-outer quadrant of left breast in female, estrogen receptor positive ([place])  C50.412    Z17.0     SUMMARY OF ONCOLOGIC HISTORY: Oncology History  Malignant neoplasm of upper-outer quadrant of left breast in female, estrogen receptor positive ([place])  05/13/2018 Initial Diagnosis   Evaluation of 5 months of thickening of the left breast upper outer quadrant.  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  Several lymph nodes were identified by ultrasound.  T2 N1 stage IIb clinical stage   05/16/2018 Cancer Staging   Staging form: Breast, AJCC 8th Edition - Clinical stage from 05/16/2018: Stage IIB (cT2, cN1, cM0, G3, ER+, PR+, HER2-) - Signed by [name], MD on 05/16/2018   05/28/2018 - 10/08/2018 Chemotherapy   DOXOrubicin (ADRIAMYCIN) chemo injection 128 mg, 60 mg/m2 = 128 mg, Intravenous,  Once, 4 of 4 cycles. Administration: 128 mg (05/28/2018), 128 mg (06/11/2018), 128 mg (06/25/2018), 128 mg (07/09/2018)  palonosetron (ALOXI) injection 0.25 mg, 0.25 mg, Intravenous,  Once, 9 of 9 cycles. Administration: 0.25 mg (05/28/2018), 0.25 mg (06/11/2018), 0.25 mg (06/25/2018), 0.25 mg (07/09/2018), 0.25 mg (09/10/2018), 0.25 mg (09/17/2018), 0.25 mg (09/24/2018), 0.25 mg (10/01/2018), 0.25 mg (10/08/2018)  pegfilgrastim (NEULASTA ONPRO KIT) injection 6 mg, 6 mg, Subcutaneous, Once, 4 of 4 cycles. Administration: 6 mg (05/28/2018), 6 mg (06/11/2018), 6  mg (06/25/2018), 6 mg (07/09/2018)  cyclophosphamide (CYTOXAN) 1,280 mg in sodium chloride 0.9 % 250 mL chemo infusion, 600 mg/m2 = 1,280 mg, Intravenous,  Once, 4 of 4 cycles. Administration: 1,280 mg (05/28/2018), 1,280 mg (06/11/2018), 1,280 mg (06/25/2018), 1,280 mg (07/09/2018)  PACLitaxel (TAXOL) 168 mg in sodium chloride 0.9 % 250 mL chemo infusion (</= 54m/m2), 80 mg/m2 = 168 mg, Intravenous,  Once, 12 of 12 cycles. Administration: 168 mg (07/23/2018), 168 mg (07/30/2018), 168 mg (08/06/2018), 168 mg (08/13/2018), 168 mg (08/20/2018), 168 mg (08/27/2018), 168 mg (09/03/2018), 168 mg (09/10/2018), 168 mg (09/17/2018), 168 mg (09/24/2018), 168 mg (10/01/2018), 168 mg (10/08/2018)  fosaprepitant (EMEND) 150 mg, dexamethasone (DECADRON) 12 mg in sodium chloride 0.9 % 145 mL IVPB, , Intravenous,  Once, 4 of 4 cycles. Administration:  (05/28/2018),  (06/11/2018),  (06/25/2018),  (07/09/2018)    08/16/2018 Genetic Testing   Negative genetic testing on the multi-cancer panel.  The Multi-Gene Panel offered by [facility] includes sequencing and/or deletion duplication testing of the following 85 genes: AIP, ALK, APC, ATM, AXIN2,BAP1,  BARD1, BLM, BMPR1A, BRCA1, BRCA2, BRIP1, CASR, CDC73, CDH1, CDK4, CDKN1B, CDKN1C, CDKN2A (p14ARF), CDKN2A (p16INK4a), CEBPA, CHEK2, CTNNA1, DICER1, DIS3L2, EGFR (c.2369C>T, p.Thr790Met variant only), EPCAM (Deletion/duplication testing only), FH, FLCN, GATA2, GPC3, GREM1 (Promoter region deletion/duplication testing only), HOXB13 (c.251G>A, p.Gly84Glu), HRAS, KIT, MAX, MEN1, MET, MITF (c.952G>A, p.Glu318Lys variant only), MLH1, MSH2, MSH3, MSH6, MUTYH, NBN, NF1, NF2, NTHL1, PALB2, PDGFRA, PHOX2B, PMS2, POLD1, POLE, POT1, PRKAR1A, PTCH1, PTEN, RAD50, RAD51C, RAD51D, RB1, RECQL4, RET, RNF43, RUNX1, SDHAF2, SDHA (sequence changes only), SDHB, SDHC, SDHD, SMAD4, SMARCA4, SMARCB1, SMARCE1, STK11, SUFU, TERC, TERT, TMEM127, TP53, TSC1, TSC2, VHL, WRN and WT1.  The report date is August 16, 2018.   10/07/2018 Breast MRI    Significant improvement of left breast malignancy.  Non-mass enhancement 3.7 cm.  Significant improvement in the left axillary lymph nodes previously there were 6 nodes and currently there was a single lymph node with mild cortical thickening.   11/05/2018 Surgery   Left lumpectomy [name] [phone]): residual invasive carcinoma s/p neoadjuvant treatment, HER-2 - (0), ER+ 100%, PR+ 60%, Ki67 10%, involved inferior and medial margins, and 8/9 left axillary lymph nodes positive for carcinoma.   11/05/2018 Cancer Staging   Staging form: Breast, AJCC 8th Edition - Pathologic stage from 11/05/2018: No Stage Recommended (ypT2, pN2a, cM0, ER+, PR+, HER2-) - Signed by [name], NP on 11/27/2018   11/18/2018 Surgery   Re-excision of left lumpectomy [name] [phone]): residual invasive carcinoma, clear margins.   12/30/2018 - 02/14/2019 Radiation Therapy   The patient initially received a dose of 50.4 Gy in 28 fractions to the breast using whole-breast tangent fields. This was delivered using a 3-D conformal technique. The patient also received a dose of 50.4 Gy in 28 fraction to the left axilla. The pt received a boost delivering an additional 12 Gy in 6 fractions using a electron boost with 18meV electrons. The total dose was 112.8 Gy.    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 with above-mentioned history of metastatic breast cancer currently on treatment with Ibrance, Faslodex, and letrozole.CT CAP on 05/21/20 showed stable osseous metastases, decreased liver lesions, and new  new metastases. She presents to the clinic todayfor a toxicity check and to review her scan.   ALLERGIES:  has No Known Allergies.  MEDICATIONS:  Current Outpatient Medications  Medication Sig Dispense Refill  . ALPRAZolam (XANAX) 0.5 MG tablet Take 1 tablet (0.5 mg total) by mouth at bedtime as needed for anxiety. 60 tablet 3  . Biotin 10 MG CAPS Take by mouth.    . calcium carbonate (TUMS - DOSED IN MG ELEMENTAL CALCIUM) 500 MG chewable tablet Chew 1 tablet by mouth as needed for indigestion or heartburn.     Leslee Home 125 MG tablet TAKE 1 TABLET BY MOUTH ONCE DAILY WITH OR WITHOUT FOOD  FOR 21 DAYS, FOLLOWED BY 7  DAYS OFF, REPEATED EVERY 28 DAYS 21 tablet 2  . letrozole (FEMARA) 2.5 MG tablet Take 1 tablet (2.5 mg total) by mouth daily. 90 tablet 3  . Multiple Vitamins-Minerals (WOMENS MULTI VITAMIN & MINERAL) TABS Take by mouth.     No current facility-administered medications for this visit.    PHYSICAL EXAMINATION: ECOG PERFORMANCE STATUS: 1 - Symptomatic but completely ambulatory  Vitals:   06/01/20 1501  BP: (!) 143/103  Pulse: (!) 116  Resp: 17  Temp: (!) 97.5 F (36.4 C)  SpO2: 100%   Filed Weights   06/01/20 1501  Weight: 205 lb 9.6 oz (93.3 kg)     LABORATORY DATA:  I have reviewed the data as listed CMP Latest Ref Rng & Units 04/20/2020 03/18/2020 01/20/2020  Glucose 70 - 99 mg/dL 94 103(H) 103(H)  BUN 6 - 20 mg/dL $Remove'12 11 10  'YIXkCsM$ Creatinine 0.44 - 1.00 mg/dL  0.79 0.77 0.78  Sodium 135 - 145 mmol/L 140 138 137  Potassium 3.5 - 5.1 mmol/L 4.3 3.6 3.8  Chloride 98 - 111 mmol/L 105 105 102  CO2 22 - 32 mmol/L $RemoveB'23 25 25  'SObbYoVI$ Calcium 8.9 - 10.3 mg/dL 9.8 9.0 9.7  Total Protein 6.5 - 8.1 g/dL 7.8 7.8 8.0  Total Bilirubin 0.3 - 1.2 mg/dL 0.7 0.7 0.7  Alkaline Phos 38 - 126 U/L 61 62 77  AST 15 - 41 U/L 37 27 19  ALT 0 - 44 U/L 49(H) 32 24    Lab Results  Component Value Date   WBC 2.7 (L) 06/01/2020   HGB 12.6 06/01/2020   HCT 35.3 (L) 06/01/2020   MCV 106.3 (H)  06/01/2020   PLT 260 06/01/2020   NEUTROABS PENDING 06/01/2020    ASSESSMENT & PLAN:  Malignant neoplasm of upper-outer quadrant of left breast in female, estrogen receptor positive ([place]) /11/2018:Evaluation of 5 months of thickening of the left breast upper outer quadrant. 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 Several lymph nodes were identified by ultrasound. T2 N1 stage IIb clinical stage  Treatment plan: 1.Neoadjuvant chemotherapy with dose dense Adriamycin and Cytoxan followed by Taxol weekly x12completed 10/08/2018 2.11/05/2018:Left lumpectomy [name]: residual invasive carcinoma s/p neoadjuvant treatment, HER-2 - (0), ER+ 100%, PR+ 60%, Ki67 10%, involved inferior and medial margins, and 8/9 left axillary lymph nodes positive for carcinoma. Resection of margins 11/18/2018: Residual cancer was identified in the left inferior margin and the final margins are clear. 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 ------------------------------------------------------------------------------------------------------------------------------------------------------  Current Treatment:Ibrance along with letrozolestarted 02/24/19   Ibrance toxicities:Denies any adverse effects to [place]. 1.Leukopenia: ANC2.2so we will continue the dosage at 125 mg. 2.Elevated AST and ALT: Most likely related to Octa.These results are normal today.  PET/CT 01/19/2020: Progressive findings with new metastatic  lesions in the liver and skeleton. Dominant lesion SUV 8.3 measuring 1.3 cm. Porta hepatis lymph node measuring 0.9 cm SUV 7.2, celiac lymph node 0.7 cm SUV 5.6, multiple new small bone lesions sacrum, right iliac bone, L2, L1, T3 vertebral body. 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  One lesion in segment 7 of the liver may be new. I discussed scan results and I believe that she is responding very well and that we need to continue with the same treatment plan.  Bone metastasis: Xgeva every 3 months Return to clinic in 3 months with labs and injection and follow-up.  Our plan is to obtain scans in 6 months.  Weight issues: She is aggressively trying to lose weight.   No orders of the defined types were placed in this encounter.  The patient has a good understanding of the overall plan. she agrees with it. she will call with any problems that may develop before the next visit here.  Total time spent: 30 mins including face to face time and time spent for planning, charting and coordination of care  Rulon Eisenmenger, MD, MPH 06/01/2020  I,  Molly Dorshimer, am acting as scribe for Dr. Wiatt Mahabir.  I have reviewed the above documentation for accuracy and completeness, and I agree with the above.       

## 2020-06-01 ENCOUNTER — Inpatient Hospital Stay: Payer: BC Managed Care – PPO

## 2020-06-01 ENCOUNTER — Inpatient Hospital Stay: Payer: BC Managed Care – PPO | Attending: Hematology and Oncology | Admitting: Hematology and Oncology

## 2020-06-01 ENCOUNTER — Other Ambulatory Visit: Payer: Self-pay

## 2020-06-01 DIAGNOSIS — C787 Secondary malignant neoplasm of liver and intrahepatic bile duct: Secondary | ICD-10-CM | POA: Diagnosis not present

## 2020-06-01 DIAGNOSIS — C50412 Malignant neoplasm of upper-outer quadrant of left female breast: Secondary | ICD-10-CM | POA: Diagnosis not present

## 2020-06-01 DIAGNOSIS — Z79899 Other long term (current) drug therapy: Secondary | ICD-10-CM | POA: Insufficient documentation

## 2020-06-01 DIAGNOSIS — Z5111 Encounter for antineoplastic chemotherapy: Secondary | ICD-10-CM | POA: Diagnosis present

## 2020-06-01 DIAGNOSIS — Z170 Estrogen receptor positive status [ER+]: Secondary | ICD-10-CM

## 2020-06-01 DIAGNOSIS — D72819 Decreased white blood cell count, unspecified: Secondary | ICD-10-CM | POA: Insufficient documentation

## 2020-06-01 DIAGNOSIS — R7401 Elevation of levels of liver transaminase levels: Secondary | ICD-10-CM | POA: Diagnosis not present

## 2020-06-01 DIAGNOSIS — Z923 Personal history of irradiation: Secondary | ICD-10-CM | POA: Diagnosis not present

## 2020-06-01 DIAGNOSIS — Z95828 Presence of other vascular implants and grafts: Secondary | ICD-10-CM

## 2020-06-01 DIAGNOSIS — C7951 Secondary malignant neoplasm of bone: Secondary | ICD-10-CM | POA: Insufficient documentation

## 2020-06-01 LAB — CBC WITH DIFFERENTIAL (CANCER CENTER ONLY)
Abs Immature Granulocytes: 0.01 10*3/uL (ref 0.00–0.07)
Basophils Absolute: 0.1 10*3/uL (ref 0.0–0.1)
Basophils Relative: 2 %
Eosinophils Absolute: 0 10*3/uL (ref 0.0–0.5)
Eosinophils Relative: 1 %
HCT: 35.3 % — ABNORMAL LOW (ref 36.0–46.0)
Hemoglobin: 12.6 g/dL (ref 12.0–15.0)
Immature Granulocytes: 0 %
Lymphocytes Relative: 25 %
Lymphs Abs: 0.7 10*3/uL (ref 0.7–4.0)
MCH: 38 pg — ABNORMAL HIGH (ref 26.0–34.0)
MCHC: 35.7 g/dL (ref 30.0–36.0)
MCV: 106.3 fL — ABNORMAL HIGH (ref 80.0–100.0)
Monocytes Absolute: 0.3 10*3/uL (ref 0.1–1.0)
Monocytes Relative: 13 %
Neutro Abs: 1.6 10*3/uL — ABNORMAL LOW (ref 1.7–7.7)
Neutrophils Relative %: 59 %
Platelet Count: 260 10*3/uL (ref 150–400)
RBC: 3.32 MIL/uL — ABNORMAL LOW (ref 3.87–5.11)
RDW: 11.6 % (ref 11.5–15.5)
WBC Count: 2.7 10*3/uL — ABNORMAL LOW (ref 4.0–10.5)
nRBC: 0 % (ref 0.0–0.2)

## 2020-06-01 LAB — CMP (CANCER CENTER ONLY)
ALT: 123 U/L — ABNORMAL HIGH (ref 0–44)
AST: 65 U/L — ABNORMAL HIGH (ref 15–41)
Albumin: 4.4 g/dL (ref 3.5–5.0)
Alkaline Phosphatase: 76 U/L (ref 38–126)
Anion gap: 12 (ref 5–15)
BUN: 9 mg/dL (ref 6–20)
CO2: 25 mmol/L (ref 22–32)
Calcium: 9.6 mg/dL (ref 8.9–10.3)
Chloride: 102 mmol/L (ref 98–111)
Creatinine: 0.79 mg/dL (ref 0.44–1.00)
GFR, Estimated: >60 mL/min (ref >60)
Glucose, Bld: 99 mg/dL (ref 70–99)
Potassium: 3.7 mmol/L (ref 3.5–5.1)
Sodium: 139 mmol/L (ref 135–145)
Total Bilirubin: 0.6 mg/dL (ref 0.3–1.2)
Total Protein: 7.7 g/dL (ref 6.5–8.1)

## 2020-06-01 MED ORDER — FULVESTRANT 250 MG/5ML IM SOLN
500.0000 mg | Freq: Once | INTRAMUSCULAR | Status: AC
  Administered 2020-06-01: 500 mg via INTRAMUSCULAR

## 2020-06-01 MED ORDER — FULVESTRANT 250 MG/5ML IM SOLN
INTRAMUSCULAR | Status: AC
  Filled 2020-06-01: qty 10

## 2020-06-03 ENCOUNTER — Telehealth: Payer: Self-pay | Admitting: Hematology and Oncology

## 2020-06-03 NOTE — Telephone Encounter (Signed)
Scheduled per 3/29 los. Pt will receive an updated appt calendar per next visit appt notes  

## 2020-06-24 ENCOUNTER — Ambulatory Visit: Payer: BC Managed Care – PPO

## 2020-06-28 ENCOUNTER — Inpatient Hospital Stay: Payer: BC Managed Care – PPO | Attending: Hematology and Oncology

## 2020-06-28 ENCOUNTER — Telehealth: Payer: Self-pay | Admitting: Hematology and Oncology

## 2020-06-28 DIAGNOSIS — Z79899 Other long term (current) drug therapy: Secondary | ICD-10-CM | POA: Insufficient documentation

## 2020-06-28 DIAGNOSIS — Z170 Estrogen receptor positive status [ER+]: Secondary | ICD-10-CM | POA: Insufficient documentation

## 2020-06-28 DIAGNOSIS — Z923 Personal history of irradiation: Secondary | ICD-10-CM | POA: Insufficient documentation

## 2020-06-28 DIAGNOSIS — C50412 Malignant neoplasm of upper-outer quadrant of left female breast: Secondary | ICD-10-CM | POA: Insufficient documentation

## 2020-06-28 DIAGNOSIS — Z5111 Encounter for antineoplastic chemotherapy: Secondary | ICD-10-CM | POA: Insufficient documentation

## 2020-06-28 NOTE — Telephone Encounter (Signed)
R/s appt per 4/25 sch msg. Pt aware.  

## 2020-07-02 ENCOUNTER — Inpatient Hospital Stay: Payer: BC Managed Care – PPO

## 2020-07-02 ENCOUNTER — Other Ambulatory Visit: Payer: Self-pay

## 2020-07-02 VITALS — BP 136/107 | HR 104 | Resp 20

## 2020-07-02 DIAGNOSIS — C50412 Malignant neoplasm of upper-outer quadrant of left female breast: Secondary | ICD-10-CM

## 2020-07-02 DIAGNOSIS — Z95828 Presence of other vascular implants and grafts: Secondary | ICD-10-CM

## 2020-07-02 DIAGNOSIS — Z170 Estrogen receptor positive status [ER+]: Secondary | ICD-10-CM

## 2020-07-02 DIAGNOSIS — Z79899 Other long term (current) drug therapy: Secondary | ICD-10-CM | POA: Diagnosis not present

## 2020-07-02 DIAGNOSIS — Z5111 Encounter for antineoplastic chemotherapy: Secondary | ICD-10-CM | POA: Diagnosis present

## 2020-07-02 DIAGNOSIS — Z923 Personal history of irradiation: Secondary | ICD-10-CM | POA: Diagnosis not present

## 2020-07-02 MED ORDER — FULVESTRANT 250 MG/5ML IM SOLN
500.0000 mg | Freq: Once | INTRAMUSCULAR | Status: AC
  Administered 2020-07-02: 500 mg via INTRAMUSCULAR

## 2020-07-02 MED ORDER — FULVESTRANT 250 MG/5ML IM SOLN
INTRAMUSCULAR | Status: AC
  Filled 2020-07-02: qty 10

## 2020-07-02 NOTE — Patient Instructions (Signed)
Fulvestrant injection What is this medicine? FULVESTRANT (ful VES trant) blocks the effects of estrogen. It is used to treat breast cancer. This medicine may be used for other purposes; ask your health care provider or pharmacist if you have questions. COMMON BRAND NAME(S): FASLODEX What should I tell my health care provider before I take this medicine? They need to know if you have any of these conditions:  bleeding disorders  liver disease  low blood counts, like low white cell, platelet, or red cell counts  an unusual or allergic reaction to fulvestrant, other medicines, foods, dyes, or preservatives  pregnant or trying to get pregnant  breast-feeding How should I use this medicine? This medicine is for injection into a muscle. It is usually given by a health care professional in a hospital or clinic setting. Talk to your pediatrician regarding the use of this medicine in children. Special care may be needed. Overdosage: If you think you have taken too much of this medicine contact a poison control center or emergency room at once. NOTE: This medicine is only for you. Do not share this medicine with others. What if I miss a dose? It is important not to miss your dose. Call your doctor or health care professional if you are unable to keep an appointment. What may interact with this medicine?  medicines that treat or prevent blood clots like warfarin, enoxaparin, dalteparin, apixaban, dabigatran, and rivaroxaban This list may not describe all possible interactions. Give your health care provider a list of all the medicines, herbs, non-prescription drugs, or dietary supplements you use. Also tell them if you smoke, drink alcohol, or use illegal drugs. Some items may interact with your medicine. What should I watch for while using this medicine? Your condition will be monitored carefully while you are receiving this medicine. You will need important blood work done while you are taking  this medicine. Do not become pregnant while taking this medicine or for at least 1 year after stopping it. Women of child-bearing potential will need to have a negative pregnancy test before starting this medicine. Women should inform their doctor if they wish to become pregnant or think they might be pregnant. There is a potential for serious side effects to an unborn child. Men should inform their doctors if they wish to father a child. This medicine may lower sperm counts. Talk to your health care professional or pharmacist for more information. Do not breast-feed an infant while taking this medicine or for 1 year after the last dose. What side effects may I notice from receiving this medicine? Side effects that you should report to your doctor or health care professional as soon as possible:  allergic reactions like skin rash, itching or hives, swelling of the face, lips, or tongue  feeling faint or lightheaded, falls  pain, tingling, numbness, or weakness in the legs  signs and symptoms of infection like fever or chills; cough; flu-like symptoms; sore throat  vaginal bleeding Side effects that usually do not require medical attention (report to your doctor or health care professional if they continue or are bothersome):  aches, pains  constipation  diarrhea  headache  hot flashes  nausea, vomiting  pain at site where injected  stomach pain This list may not describe all possible side effects. Call your doctor for medical advice about side effects. You may report side effects to FDA at 1-800-FDA-1088. Where should I keep my medicine? This drug is given in a hospital or clinic and will   not be stored at home. NOTE: This sheet is a summary. It may not cover all possible information. If you have questions about this medicine, talk to your doctor, pharmacist, or health care provider.  2021 Elsevier/Gold Standard (2017-05-31 11:34:41)  

## 2020-07-05 ENCOUNTER — Other Ambulatory Visit: Payer: Self-pay | Admitting: *Deleted

## 2020-07-05 ENCOUNTER — Encounter: Payer: Self-pay | Admitting: Hematology and Oncology

## 2020-07-05 DIAGNOSIS — C50412 Malignant neoplasm of upper-outer quadrant of left female breast: Secondary | ICD-10-CM

## 2020-07-05 DIAGNOSIS — U071 COVID-19: Secondary | ICD-10-CM

## 2020-07-05 NOTE — Progress Notes (Signed)
Pt contacted our office stating she has tested positive for Covid on 07/04/2020.  Pt states symptoms onset began 07/03/2020 and has received all Covid vaccines.  Per MD pt needing to receive monoclonial antibody Covid infusion. Referral placed and RN reached out to Wilber Bihari, NP to schedule pt.

## 2020-07-06 ENCOUNTER — Telehealth: Payer: Self-pay

## 2020-07-06 ENCOUNTER — Telehealth: Payer: Self-pay | Admitting: Nurse Practitioner

## 2020-07-06 NOTE — Telephone Encounter (Signed)
Called to discuss with patient about COVID-19 symptoms and the use of one of the available treatments for those with mild to moderate Covid symptoms and at a high risk of hospitalization.  Pt appears to qualify for outpatient treatment due to co-morbid conditions and/or a member of an at-risk group in accordance with the FDA Emergency Use Authorization.    Symptom onset: Cough,fever 07/03/20 Vaccinated: Yes Booster? Yes Immunocompromised? No Qualifiers: Breast cancer 2020 NIH Criteria:   Pt. Would to speak with APP.  Bethany Hanna

## 2020-07-06 NOTE — Telephone Encounter (Signed)
Called to discuss with patient about COVID-19 symptoms and the use of one of the available treatments for those with mild to moderate Covid symptoms and at a high risk of hospitalization.  Pt appears to qualify for outpatient treatment due to co-morbid conditions and/or a member of an at-risk group in accordance with the FDA Emergency Use Authorization.    Symptom onset: 07/03/2020 Vaccinated: Yes Booster? Yes Immunocompromised? Yes Qualifiers: breast cancer (active treatment) NIH Criteria:   Unable to reach pt - Voicemail and Mychart message sent.   Alda Lea, NP [facility] [phone]

## 2020-07-07 ENCOUNTER — Other Ambulatory Visit: Payer: Self-pay | Admitting: Adult Health

## 2020-07-07 DIAGNOSIS — U071 COVID-19: Secondary | ICD-10-CM

## 2020-07-07 NOTE — Progress Notes (Signed)
I connected by phone with Bethany Hanna on 07/07/2020 at 10:19 AM to discuss the potential use of a new treatment for mild to moderate COVID-19 viral infection in non-hospitalized patients.  This patient is a 40 y.o. female that meets the FDA criteria for Emergency Use Authorization of COVID monoclonal antibody bebtelovimab.  Has a (+) direct SARS-CoV-2 viral test result  Has mild or moderate COVID-19   Is NOT hospitalized due to COVID-19  Is within 10 days of symptom onset  Has at least one of the high risk factor(s) for progression to severe COVID-19 and/or hospitalization as defined in EUA.  Specific high risk criteria : Immunosuppressive Disease or Treatment   I have spoken and communicated the following to the patient or parent/caregiver regarding COVID monoclonal antibody treatment:  1. FDA has authorized the emergency use for the treatment of mild to moderate COVID-19 in adults and pediatric patients with positive results of direct SARS-CoV-2 viral testing who are 29 years of age and older weighing at least 40 kg, and who are at high risk for progressing to severe COVID-19 and/or hospitalization.  2. The significant known and potential risks and benefits of COVID monoclonal antibody, and the extent to which such potential risks and benefits are unknown.  3. Information on available alternative treatments and the risks and benefits of those alternatives, including clinical trials.  4. Patients treated with COVID monoclonal antibody should continue to self-isolate and use infection control measures (e.g., wear mask, isolate, social distance, avoid sharing personal items, clean and disinfect "high touch" surfaces, and frequent handwashing) according to CDC guidelines.   5. The patient or parent/caregiver has the option to accept or refuse COVID monoclonal antibody treatment.  6. Discussion about the monoclonal antibody infusion does not ensure treatment. The patient will be placed  on a list and scheduled according to risk, symptom onset and availability. A scheduler will reach to the patient to let them know if we can accommodate their infusion or not.  After reviewing this information with the patient, the patient has agreed to receive one of the available covid 19 monoclonal antibodies and will be provided an appropriate fact sheet prior to infusion. Scot Dock, NP 07/07/2020 10:19 AM

## 2020-07-08 ENCOUNTER — Other Ambulatory Visit: Payer: Self-pay

## 2020-07-08 ENCOUNTER — Ambulatory Visit (INDEPENDENT_AMBULATORY_CARE_PROVIDER_SITE_OTHER): Payer: BC Managed Care – PPO

## 2020-07-08 DIAGNOSIS — U071 COVID-19: Secondary | ICD-10-CM

## 2020-07-08 MED ORDER — ALBUTEROL SULFATE HFA 108 (90 BASE) MCG/ACT IN AERS: 2.0000 | INHALATION_SPRAY | Freq: Once | RESPIRATORY_TRACT | Status: AC | PRN

## 2020-07-08 MED ORDER — BEBTELOVIMAB 175 MG/2 ML IV (EUA)
175.0000 mg | Freq: Once | INTRAMUSCULAR | Status: AC
  Administered 2020-07-08: 175 mg via INTRAVENOUS

## 2020-07-08 MED ORDER — EPINEPHRINE 0.3 MG/0.3ML IJ SOAJ: 0.3000 mg | Freq: Once | INTRAMUSCULAR | Status: AC | PRN

## 2020-07-08 MED ORDER — FAMOTIDINE IN NACL 20-0.9 MG/50ML-% IV SOLN: 20.0000 mg | Freq: Once | INTRAVENOUS | Status: AC | PRN

## 2020-07-08 MED ORDER — METHYLPREDNISOLONE SODIUM SUCC 125 MG IJ SOLR
125.0000 mg | Freq: Once | INTRAMUSCULAR | Status: AC | PRN
Start: 2020-07-08 — End: 2020-07-08

## 2020-07-08 MED ORDER — DIPHENHYDRAMINE HCL 50 MG/ML IJ SOLN: 50.0000 mg | Freq: Once | INTRAMUSCULAR | Status: AC | PRN

## 2020-07-08 MED ORDER — SODIUM CHLORIDE 0.9 % IV SOLN: INTRAVENOUS | Status: DC | PRN

## 2020-07-08 NOTE — Progress Notes (Signed)
Diagnosis: COVID-19  Provider:  Marshell Garfinkel, MD  Procedure: Infusion  IV Type: Peripheral, IV Location: R Antecubital  Bebtelovimab, Dose: 175mg   Infusion Start Time: 1507  Infusion Stop Time: 1508  Post Infusion IV Care: Observation period completed  Discharge: Condition: Good, Destination: Home . AVS provided to patient.   Performed by:  Paul Dykes, RN

## 2020-07-08 NOTE — Patient Instructions (Signed)
10 Things You Can Do to Manage Your COVID-19 Symptoms at Home If you have possible or confirmed COVID-19: 1. Stay home except to get medical care. 2. Monitor your symptoms carefully. If your symptoms get worse, call your healthcare provider immediately. 3. Get rest and stay hydrated. 4. If you have a medical appointment, call the healthcare provider ahead of time and tell them that you have or may have COVID-19. 5. For medical emergencies, call 911 and notify the dispatch personnel that you have or may have COVID-19. 6. Cover your cough and sneezes with a tissue or use the inside of your elbow. 7. Wash your hands often with soap and water for at least 20 seconds or clean your hands with an alcohol-based hand sanitizer that contains at least 60% alcohol. 8. As much as possible, stay in a specific room and away from other people in your home. Also, you should use a separate bathroom, if available. If you need to be around other people in or outside of the home, wear a mask. 9. Avoid sharing personal items with other people in your household, like dishes, towels, and bedding. 10. Clean all surfaces that are touched often, like counters, tabletops, and doorknobs. Use household cleaning sprays or wipes according to the label instructions. cdc.gov/coronavirus 09/19/2019 This information is not intended to replace advice given to you by your health care provider. Make sure you discuss any questions you have with your health care provider. Document Revised: 01/05/2020 Document Reviewed: 01/05/2020 Elsevier Patient Education  2021 Elsevier Inc.  What types of side effects do monoclonal antibody drugs cause?  Common side effects  In general, the more common side effects caused by monoclonal antibody drugs include: . Allergic reactions, such as hives or itching . Flu-like signs and symptoms, including chills, fatigue, fever, and muscle aches and pains . Nausea, vomiting . Diarrhea . Skin  rashes . Low blood pressure   The CDC is recommending patients who receive monoclonal antibody treatments wait at least 90 days before being vaccinated.  Currently, there are no data on the safety and efficacy of mRNA COVID-19 vaccines in persons who received monoclonal antibodies or convalescent plasma as part of COVID-19 treatment. Based on the estimated half-life of such therapies as well as evidence suggesting that reinfection is uncommon in the 90 days after initial infection, vaccination should be deferred for at least 90 days, as a precautionary measure until additional information becomes available, to avoid interference of the antibody treatment with vaccine-induced immune responses.  

## 2020-07-26 ENCOUNTER — Other Ambulatory Visit: Payer: Self-pay

## 2020-07-26 ENCOUNTER — Inpatient Hospital Stay: Payer: BC Managed Care – PPO | Attending: Hematology and Oncology

## 2020-07-26 VITALS — BP 140/110 | HR 107 | Temp 98.8°F | Resp 18

## 2020-07-26 DIAGNOSIS — Z5111 Encounter for antineoplastic chemotherapy: Secondary | ICD-10-CM | POA: Diagnosis present

## 2020-07-26 DIAGNOSIS — Z95828 Presence of other vascular implants and grafts: Secondary | ICD-10-CM

## 2020-07-26 DIAGNOSIS — Z170 Estrogen receptor positive status [ER+]: Secondary | ICD-10-CM | POA: Diagnosis not present

## 2020-07-26 DIAGNOSIS — C50412 Malignant neoplasm of upper-outer quadrant of left female breast: Secondary | ICD-10-CM

## 2020-07-26 MED ORDER — FULVESTRANT 250 MG/5ML IM SOLN
INTRAMUSCULAR | Status: AC
  Filled 2020-07-26: qty 5

## 2020-07-26 MED ORDER — FULVESTRANT 250 MG/5ML IM SOLN
500.0000 mg | Freq: Once | INTRAMUSCULAR | Status: AC
  Administered 2020-07-26: 500 mg via INTRAMUSCULAR

## 2020-07-26 NOTE — Patient Instructions (Signed)
Fulvestrant injection What is this medicine? FULVESTRANT (ful VES trant) blocks the effects of estrogen. It is used to treat breast cancer. This medicine may be used for other purposes; ask your health care provider or pharmacist if you have questions. COMMON BRAND NAME(S): FASLODEX What should I tell my health care provider before I take this medicine? They need to know if you have any of these conditions:  bleeding disorders  liver disease  low blood counts, like low white cell, platelet, or red cell counts  an unusual or allergic reaction to fulvestrant, other medicines, foods, dyes, or preservatives  pregnant or trying to get pregnant  breast-feeding How should I use this medicine? This medicine is for injection into a muscle. It is usually given by a health care professional in a hospital or clinic setting. Talk to your pediatrician regarding the use of this medicine in children. Special care may be needed. Overdosage: If you think you have taken too much of this medicine contact a poison control center or emergency room at once. NOTE: This medicine is only for you. Do not share this medicine with others. What if I miss a dose? It is important not to miss your dose. Call your doctor or health care professional if you are unable to keep an appointment. What may interact with this medicine?  medicines that treat or prevent blood clots like warfarin, enoxaparin, dalteparin, apixaban, dabigatran, and rivaroxaban This list may not describe all possible interactions. Give your health care provider a list of all the medicines, herbs, non-prescription drugs, or dietary supplements you use. Also tell them if you smoke, drink alcohol, or use illegal drugs. Some items may interact with your medicine. What should I watch for while using this medicine? Your condition will be monitored carefully while you are receiving this medicine. You will need important blood work done while you are taking  this medicine. Do not become pregnant while taking this medicine or for at least 1 year after stopping it. Women of child-bearing potential will need to have a negative pregnancy test before starting this medicine. Women should inform their doctor if they wish to become pregnant or think they might be pregnant. There is a potential for serious side effects to an unborn child. Men should inform their doctors if they wish to father a child. This medicine may lower sperm counts. Talk to your health care professional or pharmacist for more information. Do not breast-feed an infant while taking this medicine or for 1 year after the last dose. What side effects may I notice from receiving this medicine? Side effects that you should report to your doctor or health care professional as soon as possible:  allergic reactions like skin rash, itching or hives, swelling of the face, lips, or tongue  feeling faint or lightheaded, falls  pain, tingling, numbness, or weakness in the legs  signs and symptoms of infection like fever or chills; cough; flu-like symptoms; sore throat  vaginal bleeding Side effects that usually do not require medical attention (report to your doctor or health care professional if they continue or are bothersome):  aches, pains  constipation  diarrhea  headache  hot flashes  nausea, vomiting  pain at site where injected  stomach pain This list may not describe all possible side effects. Call your doctor for medical advice about side effects. You may report side effects to FDA at 1-800-FDA-1088. Where should I keep my medicine? This drug is given in a hospital or clinic and will   not be stored at home. NOTE: This sheet is a summary. It may not cover all possible information. If you have questions about this medicine, talk to your doctor, pharmacist, or health care provider.  2021 Elsevier/Gold Standard (2017-05-31 11:34:41)  

## 2020-08-04 ENCOUNTER — Encounter: Payer: Self-pay | Admitting: Registered Nurse

## 2020-08-04 ENCOUNTER — Other Ambulatory Visit: Payer: Self-pay

## 2020-08-04 ENCOUNTER — Encounter: Payer: Self-pay | Admitting: Family Medicine

## 2020-08-04 ENCOUNTER — Encounter: Payer: Self-pay | Admitting: Hematology and Oncology

## 2020-08-04 ENCOUNTER — Other Ambulatory Visit: Payer: Self-pay | Admitting: Hematology and Oncology

## 2020-08-04 ENCOUNTER — Ambulatory Visit: Payer: BC Managed Care – PPO | Admitting: Registered Nurse

## 2020-08-04 VITALS — BP 149/102 | HR 120 | Temp 98.0°F | Resp 18 | Ht 68.0 in | Wt 213.2 lb

## 2020-08-04 DIAGNOSIS — I10 Essential (primary) hypertension: Secondary | ICD-10-CM

## 2020-08-04 DIAGNOSIS — G479 Sleep disorder, unspecified: Secondary | ICD-10-CM

## 2020-08-04 DIAGNOSIS — C50412 Malignant neoplasm of upper-outer quadrant of left female breast: Secondary | ICD-10-CM

## 2020-08-04 MED ORDER — TRAZODONE HCL 50 MG PO TABS: 25.0000 mg | ORAL_TABLET | Freq: Every evening | ORAL | 3 refills | Status: AC | PRN

## 2020-08-04 MED ORDER — AMLODIPINE BESYLATE 5 MG PO TABS: 5.0000 mg | ORAL_TABLET | Freq: Every day | ORAL | 0 refills | Status: DC

## 2020-08-04 NOTE — Telephone Encounter (Signed)
Patient was seen by Maximiano Coss today/

## 2020-08-04 NOTE — Telephone Encounter (Signed)
Please call pt and schedule an appointment as soon as possible can see another provider for blood pressure issue.

## 2020-08-04 NOTE — Patient Instructions (Signed)
° ° ° °  If you have lab work done today you will be contacted with your lab results within the next 2 weeks.  If you have not heard from us then please contact us. The fastest way to get your results is to register for My Chart. ° ° °IF you received an x-ray today, you will receive an invoice from [facility]. Please contact  Radiology at [phone] with questions or concerns regarding your invoice.  ° °IF you received labwork today, you will receive an invoice from [facility]. Please contact [facility] at [phone] with questions or concerns regarding your invoice.  ° °Our billing staff will not be able to assist you with questions regarding bills from these companies. ° °You will be contacted with the lab results as soon as they are available. The fastest way to get your results is to activate your My Chart account. Instructions are located on the last page of this paperwork. If you have not heard from us regarding the results in 2 weeks, please contact this office. °  ° ° ° °

## 2020-08-05 ENCOUNTER — Encounter: Payer: Self-pay | Admitting: Registered Nurse

## 2020-08-09 ENCOUNTER — Encounter: Payer: Self-pay | Admitting: Hematology and Oncology

## 2020-08-09 NOTE — Progress Notes (Signed)
Patient Care Team: [name], MD as PCP - General (Family Medicine) [name], MD as Consulting Physician (Hematology and Oncology) [name], MD as Consulting Physician (Radiation Oncology) [name], MD as Consulting Physician (General Surgery)  DIAGNOSIS:    ICD-10-CM   1. Jaundice  R17 Bilirubin, direct  2. Malignant neoplasm of upper-outer quadrant of left breast in female, estrogen receptor positive ([name])  C50.412    Z17.0     SUMMARY OF ONCOLOGIC HISTORY: Oncology History  Malignant neoplasm of upper-outer quadrant of left breast in female, estrogen receptor positive ([place])  05/13/2018 Initial Diagnosis   Evaluation of 5 months of thickening of the left breast upper outer quadrant.  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  Several lymph nodes were identified by ultrasound.  T2 N1 stage IIb clinical stage   05/16/2018 Cancer Staging   Staging form: Breast, AJCC 8th Edition - Clinical stage from 05/16/2018: Stage IIB (cT2, cN1, cM0, G3, ER+, PR+, HER2-) - Signed by [name], MD on 05/16/2018   05/28/2018 - 10/08/2018 Chemotherapy   DOXOrubicin (ADRIAMYCIN) chemo injection 128 mg, 60 mg/m2 = 128 mg, Intravenous,  Once, 4 of 4 cycles. Administration: 128 mg (05/28/2018), 128 mg (06/11/2018), 128 mg (06/25/2018), 128 mg (07/09/2018)  palonosetron (ALOXI) injection 0.25 mg, 0.25 mg, Intravenous,  Once, 9 of 9 cycles. Administration: 0.25 mg (05/28/2018), 0.25 mg (06/11/2018), 0.25 mg (06/25/2018), 0.25 mg (07/09/2018), 0.25 mg (09/10/2018), 0.25 mg (09/17/2018), 0.25 mg (09/24/2018), 0.25 mg (10/01/2018), 0.25 mg (10/08/2018)  pegfilgrastim (NEULASTA ONPRO KIT) injection 6 mg, 6 mg, Subcutaneous, Once, 4 of 4 cycles. Administration:  6 mg (05/28/2018), 6 mg (06/11/2018), 6 mg (06/25/2018), 6 mg (07/09/2018)  cyclophosphamide (CYTOXAN) 1,280 mg in sodium chloride 0.9 % 250 mL chemo infusion, 600 mg/m2 = 1,280 mg, Intravenous,  Once, 4 of 4 cycles. Administration: 1,280 mg (05/28/2018), 1,280 mg (06/11/2018), 1,280 mg (06/25/2018), 1,280 mg (07/09/2018)  PACLitaxel (TAXOL) 168 mg in sodium chloride 0.9 % 250 mL chemo infusion (</= 16m/m2), 80 mg/m2 = 168 mg, Intravenous,  Once, 12 of 12 cycles. Administration: 168 mg (07/23/2018), 168 mg (07/30/2018), 168 mg (08/06/2018), 168 mg (08/13/2018), 168 mg (08/20/2018), 168 mg (08/27/2018), 168 mg (09/03/2018), 168 mg (09/10/2018), 168 mg (09/17/2018), 168 mg (09/24/2018), 168 mg (10/01/2018), 168 mg (10/08/2018)  fosaprepitant (EMEND) 150 mg, dexamethasone (DECADRON) 12 mg in sodium chloride 0.9 % 145 mL IVPB, , Intravenous,  Once, 4 of 4 cycles. Administration:  (05/28/2018),  (06/11/2018),  (06/25/2018),  (07/09/2018)    08/16/2018 Genetic Testing   Negative genetic testing on the multi-cancer panel.  The Multi-Gene Panel offered by [facility] includes sequencing and/or deletion duplication testing of the following 85 genes: AIP, ALK, APC, ATM, AXIN2,BAP1,  BARD1, BLM, BMPR1A, BRCA1, BRCA2, BRIP1, CASR, CDC73, CDH1, CDK4, CDKN1B, CDKN1C, CDKN2A (p14ARF), CDKN2A (p16INK4a), CEBPA, CHEK2, CTNNA1, DICER1, DIS3L2, EGFR (c.2369C>T, p.Thr790Met variant only), EPCAM (Deletion/duplication testing only), FH, FLCN, GATA2, GPC3, GREM1 (Promoter region deletion/duplication testing only), HOXB13 (c.251G>A, p.Gly84Glu), HRAS, KIT, MAX, MEN1, MET, MITF (c.952G>A, p.Glu318Lys variant only), MLH1, MSH2, MSH3, MSH6, MUTYH, NBN, NF1, NF2, NTHL1, PALB2, PDGFRA, PHOX2B, PMS2, POLD1, POLE, POT1, PRKAR1A, PTCH1, PTEN, RAD50, RAD51C, RAD51D, RB1, RECQL4, RET, RNF43, RUNX1, SDHAF2, SDHA (sequence changes only), SDHB, SDHC, SDHD, SMAD4, SMARCA4, SMARCB1, SMARCE1, STK11, SUFU, TERC, TERT, TMEM127,  TP53, TSC1, TSC2, VHL, WRN and WT1.  The report date is  August 16, 2018.   10/07/2018 Breast MRI   Significant improvement of left breast malignancy.  Non-mass enhancement 3.7 cm.  Significant improvement in the left axillary lymph nodes previously there were 6 nodes and currently there was a single lymph node with mild cortical thickening.   11/05/2018 Surgery   Left lumpectomy [name] [phone]): residual invasive carcinoma s/p neoadjuvant treatment, HER-2 - (0), ER+ 100%, PR+ 60%, Ki67 10%, involved inferior and medial margins, and 8/9 left axillary lymph nodes positive for carcinoma.   11/05/2018 Cancer Staging   Staging form: Breast, AJCC 8th Edition - Pathologic stage from 11/05/2018: No Stage Recommended (ypT2, pN2a, cM0, ER+, PR+, HER2-) - Signed by [name], NP on 11/27/2018   11/18/2018 Surgery   Re-excision of left lumpectomy [name] [phone]): residual invasive carcinoma, clear margins.   12/30/2018 - 02/14/2019 Radiation Therapy   The patient initially received a dose of 50.4 Gy in 28 fractions to the breast using whole-breast tangent fields. This was delivered using a 3-D conformal technique. The patient also received a dose of 50.4 Gy in 28 fraction to the left axilla. The pt received a boost delivering an additional 12 Gy in 6 fractions using a electron boost with 25mV electrons. The total dose was 112.8 Gy.    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 with above-mentioned history of metastatic breast cancer currently on treatment with [name],and letrozole.She presents to the clinic todayfor a toxicity  check.  She comes in with complaint of sudden onset of yellowing of her sclera.  All of her trouble started about May when she was diagnosed with COVID-19 and subsequently had to receive monoclonal antibody therapy.  She has since not felt well.  She has extreme fatigue and shortness of breath to minimal exertion.  She has also noticed yellowing of her eyeballs and therefore came in for an urgent visit today.  She does not have any abdominal pain or nausea or vomiting.  She has fatigue.  She was diagnosed with hypertension and was started on Norvasc about a month ago.  She thought all of the side effects were related to Norvasc.  She had stopped both Norvasc and Ibrance a couple of days ago.   ALLERGIES:  has No Known Allergies.  MEDICATIONS:  Current Outpatient Medications  Medication Sig Dispense Refill  . ALPRAZolam (XANAX) 0.5 MG tablet Take 1 tablet (0.5 mg total) by mouth at bedtime as needed for anxiety. 60 tablet 3  . Biotin 10 MG CAPS Take by mouth.    . calcium carbonate (TUMS - DOSED IN MG ELEMENTAL CALCIUM) 500 MG chewable tablet Chew 1 tablet by mouth as needed for indigestion or heartburn.     . Multiple Vitamins-Minerals (WOMENS MULTI VITAMIN & MINERAL) TABS Take by mouth.    . traZODone (DESYREL) 50 MG tablet Take 0.5-1 tablets (25-50 mg total) by mouth at bedtime as needed for sleep. 30 tablet 3   Current Facility-Administered Medications  Medication Dose Route Frequency Provider Last Rate Last Admin  . 0.9 %  sodium chloride infusion   Intravenous  PRN [name], NP        PHYSICAL EXAMINATION: ECOG PERFORMANCE STATUS: 1 - Symptomatic but completely ambulatory  Vitals:   08/10/20 0959  BP: (!) 131/93  Pulse: (!) 133  Resp: 16  Temp: 97.7 F (36.5 C)  SpO2: 100%   Filed Weights   08/10/20 0959  Weight: 217 lb 14.4 oz (98.8 kg)    LABORATORY DATA:  I have reviewed the data as listed CMP Latest Ref Rng & Units 08/10/2020 06/01/2020 04/20/2020  Glucose  70 - 99 mg/dL 88 99 94  BUN 6 - 20 mg/dL 11 9 12   Creatinine 0.44 - 1.00 mg/dL 0.77 0.79 0.79  Sodium 135 - 145 mmol/L 133(L) 139 140  Potassium 3.5 - 5.1 mmol/L 3.9 3.7 4.3  Chloride 98 - 111 mmol/L 99 102 105  CO2 22 - 32 mmol/L 22 25 23   Calcium 8.9 - 10.3 mg/dL 8.7(L) 9.6 9.8  Total Protein 6.5 - 8.1 g/dL 6.6 7.7 7.8  Total Bilirubin 0.3 - 1.2 mg/dL 14.5(HH) 0.6 0.7  Alkaline Phos 38 - 126 U/L 370(H) 76 61  AST 15 - 41 U/L 507(HH) 65(H) 37  ALT 0 - 44 U/L 194(H) 123(H) 49(H)    Lab Results  Component Value Date   WBC 3.9 (L) 08/10/2020   HGB 12.8 08/10/2020   HCT 35.5 (L) 08/10/2020   MCV 108.9 (H) 08/10/2020   PLT 136 (L) 08/10/2020   NEUTROABS 3.2 08/10/2020    ASSESSMENT & PLAN:  Malignant neoplasm of upper-outer quadrant of left breast in female, estrogen receptor positive ([place]) /11/2018:Evaluation of 5 months of thickening of the left breast upper outer quadrant. 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 Several lymph nodes were identified by ultrasound. T2 N1 stage IIb clinical stage  Treatment plan: 1.Neoadjuvant chemotherapy with dose dense Adriamycin and Cytoxan followed by Taxol weekly x12completed 10/08/2018 2.11/05/2018:Left lumpectomy [name]: residual invasive carcinoma s/p neoadjuvant treatment, HER-2 - (0), ER+ 100%, PR+ 60%, Ki67 10%, involved inferior and medial margins, and 8/9 left axillary lymph nodes positive for carcinoma. Resection of margins 11/18/2018: Residual cancer was identified in the left inferior margin and the final margins are clear. 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 ------------------------------------------------------------------------------------------------------------------------------------------------------  Current Treatment:Faslodex Ibrance along with letrozolestarted 02/24/19   Ibrance toxicities:Denies any adverse effects to [place]. 1.Leukopenia: ANC2.2so we will continue the dosage at 125 mg. 2.severe jaundice with elevated AST and ALT: Bilirubin is 14.5.  Direct bilirubin is 8.9. Marked increase in AST and ALT and alkaline phosphatase.  (Patient does not have a gallbladder) The above results suggest obstructive jaundice and potentially from obstruction of the biliary system.  Based on the sudden onset of severe jaundice, I recommended that we admit her to the hospital for further evaluation including obtaining a CT scan and consulting gastroenterology. We will hold off on giving her Ibrance or letrozole until LFTs improved.  Orders Placed This Encounter  Procedures  . Bilirubin, direct    Standing Status:   Future    Number of Occurrences:   1    Standing Expiration Date:   08/10/2021   The patient has a good understanding  of the overall plan. she agrees with it. she will call with any problems that may develop before the next visit here.  Total time spent: 45 mins including face to face time and time spent for planning, charting and coordination of care  Rulon Eisenmenger, MD, MPH 08/10/2020  I, Cloyde Reams Dorshimer, am acting as scribe for Dr. [name].  I have reviewed the above documentation for accuracy and completeness, and I agree with the above.

## 2020-08-09 NOTE — Assessment & Plan Note (Signed)
/  11/2018:Evaluation of 5 months of thickening of the left breast upper outer quadrant. 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 Several lymph nodes were identified by ultrasound. T2 N1 stage IIb clinical stage  Treatment plan: 1.Neoadjuvant chemotherapy with dose dense Adriamycin and Cytoxan followed by Taxol weekly x12completed 10/08/2018 2.11/05/2018:Left lumpectomy Bethany Hanna): residual invasive carcinoma s/p neoadjuvant treatment, HER-2 - (0), ER+ 100%, PR+ 60%, Ki67 10%, involved inferior and medial margins, and 8/9 left axillary lymph nodes positive for carcinoma. Resection of margins 11/18/2018: Residual cancer was identified in the left inferior margin and the final margins are clear. 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 ------------------------------------------------------------------------------------------------------------------------------------------------------  Current Treatment:Ibrance along with letrozolestarted 02/24/19   Ibrance toxicities:Denies any adverse effects to Bethany Hanna. 1.Leukopenia: ANC2.2so we will continue the dosage at 125 mg. 2.Elevated AST and ALT: Most likely related to Bethany Hanna.These results are normal today.  PET/CT 01/19/2020: Progressive findings with new metastatic lesions in the liver and skeleton. Dominant lesion SUV 8.3 measuring 1.3 cm. Porta hepatis lymph node measuring 0.9 cm SUV 7.2, celiac lymph node 0.7 cm SUV 5.6, multiple new small bone lesions  sacrum, right iliac bone, L2, L1, T3 vertebral body. 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  One lesion in segment 7 of the liver may be new. I discussed scan results and I believe that she is responding very well and that we need to continue with the same treatment plan.  Bone metastasis: Xgeva every 3 months Return to clinic in 3 months with labs and injection and scans

## 2020-08-10 ENCOUNTER — Encounter: Payer: Self-pay | Admitting: *Deleted

## 2020-08-10 ENCOUNTER — Encounter: Payer: Self-pay | Admitting: Hematology and Oncology

## 2020-08-10 ENCOUNTER — Other Ambulatory Visit: Payer: Self-pay | Admitting: *Deleted

## 2020-08-10 ENCOUNTER — Ambulatory Visit (HOSPITAL_BASED_OUTPATIENT_CLINIC_OR_DEPARTMENT_OTHER)
Admission: RE | Admit: 2020-08-10 | Discharge: 2020-08-10 | Disposition: A | Discharge status: Home / Self Care | Payer: BC Managed Care – PPO | Source: Ambulatory Visit | Attending: Hematology and Oncology | Admitting: Hematology and Oncology

## 2020-08-10 ENCOUNTER — Inpatient Hospital Stay (HOSPITAL_BASED_OUTPATIENT_CLINIC_OR_DEPARTMENT_OTHER): Payer: BC Managed Care – PPO | Admitting: Hematology and Oncology

## 2020-08-10 ENCOUNTER — Other Ambulatory Visit: Payer: Self-pay

## 2020-08-10 ENCOUNTER — Inpatient Hospital Stay (HOSPITAL_COMMUNITY)
Admission: AD | Admit: 2020-08-10 | Discharge: 2020-08-12 | DRG: 439 | Disposition: A | Discharge status: Home / Self Care | Payer: BC Managed Care – PPO | Source: Ambulatory Visit | Attending: Internal Medicine | Admitting: Internal Medicine

## 2020-08-10 ENCOUNTER — Encounter (HOSPITAL_COMMUNITY): Payer: Self-pay | Admitting: Family Medicine

## 2020-08-10 ENCOUNTER — Inpatient Hospital Stay: Payer: BC Managed Care – PPO | Attending: Hematology and Oncology

## 2020-08-10 VITALS — BP 131/93 | HR 133 | Temp 97.7°F | Resp 16 | Ht 68.0 in | Wt 217.9 lb

## 2020-08-10 DIAGNOSIS — Z79899 Other long term (current) drug therapy: Secondary | ICD-10-CM | POA: Insufficient documentation

## 2020-08-10 DIAGNOSIS — C50412 Malignant neoplasm of upper-outer quadrant of left female breast: Secondary | ICD-10-CM

## 2020-08-10 DIAGNOSIS — D701 Agranulocytosis secondary to cancer chemotherapy: Secondary | ICD-10-CM

## 2020-08-10 DIAGNOSIS — K760 Fatty (change of) liver, not elsewhere classified: Secondary | ICD-10-CM | POA: Diagnosis present

## 2020-08-10 DIAGNOSIS — Z90722 Acquired absence of ovaries, bilateral: Secondary | ICD-10-CM

## 2020-08-10 DIAGNOSIS — Z170 Estrogen receptor positive status [ER+]: Secondary | ICD-10-CM

## 2020-08-10 DIAGNOSIS — R17 Unspecified jaundice: Secondary | ICD-10-CM

## 2020-08-10 DIAGNOSIS — Z923 Personal history of irradiation: Secondary | ICD-10-CM

## 2020-08-10 DIAGNOSIS — R7989 Other specified abnormal findings of blood chemistry: Secondary | ICD-10-CM | POA: Insufficient documentation

## 2020-08-10 DIAGNOSIS — Z9012 Acquired absence of left breast and nipple: Secondary | ICD-10-CM

## 2020-08-10 DIAGNOSIS — C7951 Secondary malignant neoplasm of bone: Secondary | ICD-10-CM | POA: Diagnosis present

## 2020-08-10 DIAGNOSIS — Z8616 Personal history of COVID-19: Secondary | ICD-10-CM

## 2020-08-10 DIAGNOSIS — R000 Tachycardia, unspecified: Secondary | ICD-10-CM | POA: Diagnosis not present

## 2020-08-10 DIAGNOSIS — C787 Secondary malignant neoplasm of liver and intrahepatic bile duct: Secondary | ICD-10-CM | POA: Insufficient documentation

## 2020-08-10 DIAGNOSIS — K429 Umbilical hernia without obstruction or gangrene: Secondary | ICD-10-CM | POA: Diagnosis present

## 2020-08-10 DIAGNOSIS — D696 Thrombocytopenia, unspecified: Secondary | ICD-10-CM

## 2020-08-10 DIAGNOSIS — R52 Pain, unspecified: Secondary | ICD-10-CM

## 2020-08-10 DIAGNOSIS — Z5111 Encounter for antineoplastic chemotherapy: Secondary | ICD-10-CM | POA: Insufficient documentation

## 2020-08-10 DIAGNOSIS — Z9221 Personal history of antineoplastic chemotherapy: Secondary | ICD-10-CM

## 2020-08-10 DIAGNOSIS — D72819 Decreased white blood cell count, unspecified: Secondary | ICD-10-CM

## 2020-08-10 DIAGNOSIS — T451X5A Adverse effect of antineoplastic and immunosuppressive drugs, initial encounter: Secondary | ICD-10-CM

## 2020-08-10 DIAGNOSIS — R030 Elevated blood-pressure reading, without diagnosis of hypertension: Secondary | ICD-10-CM | POA: Diagnosis not present

## 2020-08-10 DIAGNOSIS — Z8543 Personal history of malignant neoplasm of ovary: Secondary | ICD-10-CM

## 2020-08-10 DIAGNOSIS — F419 Anxiety disorder, unspecified: Secondary | ICD-10-CM | POA: Diagnosis present

## 2020-08-10 DIAGNOSIS — Z20822 Contact with and (suspected) exposure to covid-19: Secondary | ICD-10-CM | POA: Diagnosis present

## 2020-08-10 DIAGNOSIS — K219 Gastro-esophageal reflux disease without esophagitis: Secondary | ICD-10-CM | POA: Diagnosis present

## 2020-08-10 DIAGNOSIS — Z808 Family history of malignant neoplasm of other organs or systems: Secondary | ICD-10-CM

## 2020-08-10 DIAGNOSIS — R7401 Elevation of levels of liver transaminase levels: Secondary | ICD-10-CM

## 2020-08-10 DIAGNOSIS — I10 Essential (primary) hypertension: Secondary | ICD-10-CM | POA: Diagnosis present

## 2020-08-10 DIAGNOSIS — K858 Other acute pancreatitis without necrosis or infection: Principal | ICD-10-CM | POA: Diagnosis present

## 2020-08-10 DIAGNOSIS — Z853 Personal history of malignant neoplasm of breast: Secondary | ICD-10-CM

## 2020-08-10 DIAGNOSIS — Z8249 Family history of ischemic heart disease and other diseases of the circulatory system: Secondary | ICD-10-CM

## 2020-08-10 LAB — CMP (CANCER CENTER ONLY)
ALT: 194 U/L — ABNORMAL HIGH (ref 0–44)
AST: 507 U/L (ref 15–41)
Albumin: 2.6 g/dL — ABNORMAL LOW (ref 3.5–5.0)
Alkaline Phosphatase: 370 U/L — ABNORMAL HIGH (ref 38–126)
Anion gap: 12 (ref 5–15)
BUN: 11 mg/dL (ref 6–20)
CO2: 22 mmol/L (ref 22–32)
Calcium: 8.7 mg/dL — ABNORMAL LOW (ref 8.9–10.3)
Chloride: 99 mmol/L (ref 98–111)
Creatinine: 0.77 mg/dL (ref 0.44–1.00)
GFR, Estimated: >60 mL/min (ref >60)
Glucose, Bld: 88 mg/dL (ref 70–99)
Potassium: 3.9 mmol/L (ref 3.5–5.1)
Sodium: 133 mmol/L — ABNORMAL LOW (ref 135–145)
Total Bilirubin: 14.5 mg/dL (ref 0.3–1.2)
Total Protein: 6.6 g/dL (ref 6.5–8.1)

## 2020-08-10 LAB — CBC WITH DIFFERENTIAL (CANCER CENTER ONLY)
Abs Immature Granulocytes: 0.03 10*3/uL (ref 0.00–0.07)
Basophils Absolute: 0 10*3/uL (ref 0.0–0.1)
Basophils Relative: 1 %
Eosinophils Absolute: 0 10*3/uL (ref 0.0–0.5)
Eosinophils Relative: 0 %
HCT: 35.5 % — ABNORMAL LOW (ref 36.0–46.0)
Hemoglobin: 12.8 g/dL (ref 12.0–15.0)
Immature Granulocytes: 1 %
Lymphocytes Relative: 10 %
Lymphs Abs: 0.4 10*3/uL — ABNORMAL LOW (ref 0.7–4.0)
MCH: 39.3 pg — ABNORMAL HIGH (ref 26.0–34.0)
MCHC: 36.1 g/dL — ABNORMAL HIGH (ref 30.0–36.0)
MCV: 108.9 fL — ABNORMAL HIGH (ref 80.0–100.0)
Monocytes Absolute: 0.2 10*3/uL (ref 0.1–1.0)
Monocytes Relative: 5 %
Neutro Abs: 3.2 10*3/uL (ref 1.7–7.7)
Neutrophils Relative %: 83 %
Platelet Count: 136 10*3/uL — ABNORMAL LOW (ref 150–400)
RBC: 3.26 MIL/uL — ABNORMAL LOW (ref 3.87–5.11)
RDW: 16.7 % — ABNORMAL HIGH (ref 11.5–15.5)
WBC Count: 3.9 10*3/uL — ABNORMAL LOW (ref 4.0–10.5)
nRBC: 0 % (ref 0.0–0.2)

## 2020-08-10 LAB — BILIRUBIN, DIRECT: Bilirubin, Direct: 8.9 mg/dL — ABNORMAL HIGH (ref 0.0–0.2)

## 2020-08-10 MED ORDER — ENOXAPARIN SODIUM 40 MG/0.4ML IJ SOSY
40.0000 mg | PREFILLED_SYRINGE | INTRAMUSCULAR | Status: DC
  Administered 2020-08-10 – 2020-08-11 (×2): 40 mg via SUBCUTANEOUS
  Filled 2020-08-10 (×2): qty 0.4

## 2020-08-10 MED ORDER — SODIUM CHLORIDE 0.9 % IV SOLN: INTRAVENOUS | Status: DC

## 2020-08-10 MED ORDER — KETOROLAC TROMETHAMINE 15 MG/ML IJ SOLN
15.0000 mg | Freq: Once | INTRAMUSCULAR | Status: AC
  Administered 2020-08-10: 15 mg via INTRAVENOUS
  Filled 2020-08-10: qty 1

## 2020-08-10 MED ORDER — CALCIUM CARBONATE ANTACID 500 MG PO CHEW: 1.0000 | CHEWABLE_TABLET | ORAL | Status: DC | PRN

## 2020-08-10 MED ORDER — MORPHINE SULFATE (PF) 2 MG/ML IV SOLN
0.5000 mg | INTRAVENOUS | Status: DC | PRN
Start: 2020-08-10 — End: 2020-08-12
  Administered 2020-08-10 – 2020-08-12 (×3): 0.5 mg via INTRAVENOUS
  Filled 2020-08-10 (×3): qty 1

## 2020-08-10 MED ORDER — LORAZEPAM 0.5 MG PO TABS
0.5000 mg | ORAL_TABLET | Freq: Once | ORAL | Status: AC
  Administered 2020-08-10: 0.5 mg via ORAL
  Filled 2020-08-10: qty 1

## 2020-08-10 NOTE — Progress Notes (Addendum)
Per MD request orders placed for STAT CT AP w/o contrast.  Also per MD request RN placed call to bed control for pt to be directly admitted to [facility].

## 2020-08-10 NOTE — Progress Notes (Signed)
   08/10/20 1849  Assess: MEWS Score  Temp 99 F (37.2 C)  BP (!) 149/96  Pulse Rate (!) 118  Resp 19  Level of Consciousness Alert  SpO2 100 %  Assess: MEWS Score  MEWS Temp 0  MEWS Systolic 0  MEWS Pulse 2  MEWS RR 0  MEWS LOC 0  MEWS Score 2  MEWS Score Color Yellow  Assess: if the MEWS score is Yellow or Red  Were vital signs taken at a resting state? Yes  Focused Assessment No change from prior assessment  Does the patient meet 2 or more of the SIRS criteria? No  MEWS guidelines implemented *See Row Information* Yes  Treat  MEWS Interventions Other (Comment) (Notified Charge RN and MD)  Pain Scale 0-10  Pain Score 0  Take Vital Signs  Increase Vital Sign Frequency  Yellow: Q 2hr X 2 then Q 4hr X 2, if remains yellow, continue Q 4hrs  Escalate  MEWS: Escalate Yellow: discuss with charge nurse/RN and consider discussing with provider and RRT  Notify: Charge Nurse/RN  Name of Charge Nurse/RN Notified Melissa, RN  Date Charge Nurse/RN Notified 08/10/20  Time Charge Nurse/RN Notified 1850  Notify: Provider  Provider Name/Title Dr. Flossie Buffy  Date Provider Notified 08/10/20  Time Provider Notified 1900  Notification Type Face-to-face  Provider response At bedside (at bedside to admit patient)  Date of Provider Response 08/10/20  Time of Provider Response 1900  Document  Patient Outcome Other (Comment) (MD aware. Will order anxiety meds to help with HR.)  Assess: SIRS CRITERIA  SIRS Temperature  0  SIRS Pulse 1  SIRS Respirations  0  SIRS WBC 0  SIRS Score Sum  1

## 2020-08-10 NOTE — Progress Notes (Signed)
CRITICAL VALUE STICKER  CRITICAL VALUE: Total BIli 14.5 AST 507  RECEIVER (on-site recipient of call): Merleen Nicely, [place] NOTIFIED: 08/10/20 at 59  MD NOTIFIED: [name], MD  RESPONSE: MD notified and verbalized understanding.  No orders received at this time.

## 2020-08-10 NOTE — H&P (Addendum)
History and Physical    Bethany Hanna QBV:[id number] DOB: [birth year] DOA: 08/10/2020  PCP: [name], MD  Patient coming from: Cancer center  I have personally briefly reviewed patient's old medical records in [place]  Chief Complaint: jaundice  HPI: Bethany Hanna is a 40 y.o. 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  Patient reports that about a week ago she felt dizzy at her office and noted her blood pressure to be elevated up to 280 systolic.  She then was seen by an NP in her primary office and started on Norvasc.  However she "did not feel right" after taking it. Then about 4 days ago she begin to note yellowing of her eyes. No worsening abdominal pain but she has a reducible umbilical hernia that hurts at times especially after lifting something heavy. No nausea, vomiting or diarrhea. No fever. No changes in appetite. Still able to eat a steak dinner the other night. Has drank more alcohol recently up to 2-3 glasses of wine due to [employer].  She saw her primary oncology Dr. Lindi Adie today and had lab work showing elevated AST of 146, ALT of 91, alkaline phosphatase of 370.  Total bilirubin of 14.5, Direct bilrubin of 8.9.  CT abdomen pelvis without contrast shows hepatic steatosis and hepatomegaly.  Exam is limited without IV contrast but shows likely progression of hepatic metastasis compared to prior scan in March.  Cholecystectomy without biliary ductal dilatation.  CBC shows Leukopenia of 3.9, thrombocytopenia 136.  Electrolytes otherwise normal.  Review of Systems: Constitutional: No Weight Change, No Fever ENT/Mouth: No sore throat, No Rhinorrhea Eyes: No Eye Pain, No Vision  Changes Cardiovascular: No Chest Pain, no SOB Respiratory: No Cough, No Sputum, No Wheezing, no Dyspnea  Gastrointestinal: No Nausea, No Vomiting, No Diarrhea, No Constipation, + Pain Genitourinary: no Urinary Incontinence Musculoskeletal: No Arthralgias, No Myalgias Skin: No Skin Lesions, No Pruritus, Neuro: no Weakness, No Numbness Psych: No Anxiety/Panic, No Depression, no decrease appetite Heme/Lymph: No Bruising, No Bleeding  Past Medical History:  Diagnosis Date  . Anxiety    SITUational -uses xanax during the day, reports the ativan is for bedtime  . Elevated blood pressure reading in office with white coat syndrome, without diagnosis of hypertension   . Family history of colon cancer   . Family history of melanoma   . GERD (gastroesophageal reflux disease)   . History of cancer chemotherapy    left breast cancer--- 05-28-2018 to 10-08-2018  . History of migraine   . Hypertension   . Malignant neoplasm of upper-outer quadrant of left breast in female, estrogen receptor positive [facility]) oncologist--- dr Lindi Adie   dx 05-13-2018, clinical stage IIB, grade 3, invasive mammary carcinoma (ER/PR +, HER2 negative);  completed chemotherapy 10-08-2018;  11-05-2018 s/p left breast lumpectomy w/ node dissection's and Re-excision 11-18-2018;  started radiation therapy 12-30-2018 to complete 02-14-2019  . PONV (postoperative nausea and vomiting)   . Radiation burn    02-13-2019  per pt left breast , current radiation treatment    Past Surgical History:  Procedure Laterality Date  . BREAST LUMPECTOMY WITH RADIOACTIVE SEED AND AXILLARY LYMPH NODE DISSECTION Left 11/05/2018   Procedure: LEFT BREAST LUMPECTOMY WITH BRACKETED RADIOACTIVE SEED AND LEFT AXILLARY LYMPH NODE DISSECTION;  Surgeon: [name],  MD;  Location: [place];  Service: General;  Laterality: Left;  . CHOLECYSTECTOMY N/A 04/09/2015   Procedure: LAPAROSCOPIC CHOLECYSTECTOMY WITH INTRAOPERATIVE CHOLANGIOGRAM;  Surgeon: Autumn Messing  III, MD;  Location: [facility];  Service: General;  Laterality: N/A;  . DILATATION & CURRETTAGE/HYSTEROSCOPY WITH RESECTOCOPE N/A 07/31/2013   Procedure: [place];  Surgeon: Princess Bruins, MD;  Location: [place] [facility];  Service: Gynecology;  Laterality: N/A;  1 hr.  Marland Kitchen DILATION AND EVACUATION N/A 12/12/2017   Procedure: DILATATION AND EVACUATION (D&E) 2ND TRIMESTER;  Surgeon: Brien Few, MD;  Location: [facility];  Service: Gynecology;  Laterality: N/A;  . OPERATIVE ULTRASOUND N/A 12/12/2017   Procedure: OPERATIVE ULTRASOUND;  Surgeon: Brien Few, MD;  Location: [facility];  Service: Gynecology;  Laterality: N/A;  . PORT-A-CATH REMOVAL Right 11/05/2018   Procedure: REMOVAL PORT-A-CATH;  Surgeon: [name], MD;  Location: [place];  Service: General;  Laterality: Right;  . PORTACATH PLACEMENT Right 05/27/2018   Procedure: INSERTION PORT-A-CATH WITH ULTRASOUND;  Surgeon: [name], MD;  Location: [place];  Service: General;  Laterality: Right;  GENERAL AND LMA  . RE-EXCISION OF BREAST CANCER,SUPERIOR MARGINS Left 11/18/2018   Procedure: RE-EXCISION OF LEFT BREAST MARGIN;  Surgeon: [name], MD;  Location: [place];  Service: General;  Laterality: Left;  . ROBOTIC ASSISTED SALPINGO OOPHERECTOMY Bilateral 02/18/2019   Procedure: XI ROBOTIC ASSISTED SALPINGO OOPHORECTOMY;  Surgeon: Brien Few, MD;  Location: [place];  Service: Gynecology;  Laterality: Bilateral;  Requests 1 hr. RNFA Tracie confirmed on 02/13/19 CS     reports that she has never smoked. She has never used smokeless tobacco. She reports current alcohol use of about 2.0 - 3.0 standard drinks of alcohol per week. She reports that she does not use drugs. Social History  No Known Allergies  Family History  Problem Relation Age of Onset  . Arthritis Mother   . Migraines Mother   . ADD / ADHD Mother   . Varicose Veins Mother   .  Hypertension Father   . Alcohol abuse Brother   . Varicose Veins Brother   . Stroke Maternal Grandmother   . Melanoma Maternal Grandmother   . Pulmonary fibrosis Maternal Grandfather   . Mitral valve prolapse Paternal Grandmother   . Migraines Paternal Grandmother   . Lymphoma Paternal Grandfather        was treated for this and did ok  . Heart failure Paternal Grandfather   . Eczema Son   . Colon cancer Maternal Aunt 53     Prior to Admission medications   Medication Sig Start Date End Date Taking? Authorizing Provider  ALPRAZolam Duanne Moron) 0.5 MG tablet Take 1 tablet (0.5 mg total) by mouth at bedtime as needed for anxiety. 01/20/20  Yes [name], MD  calcium carbonate (TUMS - DOSED IN MG ELEMENTAL CALCIUM) 500 MG chewable tablet Chew 1 tablet by mouth as needed for indigestion or heartburn.    Yes [provider]  Multiple Vitamins-Minerals (WOMENS MULTI VITAMIN & MINERAL) TABS Take by mouth.   Yes [provider]  traZODone (DESYREL) 50 MG tablet Take 0.5-1 tablets (25-50 mg total) by mouth at bedtime as needed for sleep. 08/04/20  Yes Maximiano Coss, NP  Biotin 10 MG CAPS Take by mouth.    [provider]    Physical Exam: Vitals:   08/10/20 1849  BP: (!) 149/96  Pulse: (!) 118  Resp: 19  Temp: 99 F (37.2 C)  TempSrc: Oral  SpO2: 100%    Constitutional: NAD, calm, comfortable, young female laying upright at 40 degree incline in bed Vitals:   08/10/20 1849  BP: (!) 149/96  Pulse: (!) 118  Resp: 19  Temp: 99 F (37.2 C)  TempSrc: Oral  SpO2: 100%   Eyes: PERRL, sclera icterus bilaterally ENMT: Mucous membranes are moist. Neck: normal, supple Respiratory: clear to auscultation bilaterally, no wheezing, no crackles. Normal respiratory effort.   Cardiovascular: Regular rate and rhythm, no murmurs / rubs / gallops. No extremity edema.. Abdomen: no tenderness, easily reducible small umbilical hernia. bowel sounds positive.   Musculoskeletal: no clubbing / cyanosis. No joint deformity upper and lower extremities. Good ROM, no contractures. Normal muscle tone.  Skin: no rashes, lesions, ulcers. No induration Neurologic: CN 2-12 grossly intact. Sensation intact,  Strength 5/5 in all 4.  Psychiatric: Normal judgment and insight. Alert and oriented x 3. Anxious and talkative.    Labs on Admission: I have personally reviewed following labs and imaging studies  CBC: Recent Labs  Lab 08/10/20 0936  WBC 3.9*  NEUTROABS 3.2  HGB 12.8  HCT 35.5*  MCV 108.9*  PLT 762*   Basic Metabolic Panel: Recent Labs  Lab 08/10/20 0936  NA 133*  K 3.9  CL 99  CO2 22  GLUCOSE 88  BUN 11  CREATININE 0.77  CALCIUM 8.7*   GFR: Estimated Creatinine Clearance: 116.1 mL/min (by C-G formula based on SCr of 0.77 mg/dL). Liver Function Tests: Recent Labs  Lab 08/10/20 0936  AST 507*  ALT 194*  ALKPHOS 370*  BILITOT 14.5*  PROT 6.6  ALBUMIN 2.6*   No results for input(s): LIPASE, AMYLASE in the last 168 hours. No results for input(s): AMMONIA in the last 168 hours. Coagulation Profile: No results for input(s): INR, PROTIME in the last 168 hours. Cardiac Enzymes: No results for input(s): CKTOTAL, CKMB, CKMBINDEX, TROPONINI in the last 168 hours. BNP (last 3 results) No results for input(s): PROBNP in the last 8760 hours. HbA1C: No results for input(s): HGBA1C in the last 72 hours. CBG: No results for input(s): GLUCAP in the last 168 hours. Lipid Profile: No results for input(s): CHOL, HDL, LDLCALC, TRIG, CHOLHDL, LDLDIRECT in the last 72 hours. Thyroid Function Tests: No results for input(s): TSH, T4TOTAL, FREET4, T3FREE, THYROIDAB in the last 72 hours. Anemia Panel: No results for input(s): VITAMINB12, FOLATE, FERRITIN, TIBC, IRON, RETICCTPCT in the last 72 hours. Urine analysis:    Component Value Date/Time   COLORURINE YELLOW 04/09/2015 0004   APPEARANCEUR CLEAR 04/09/2015 0004   LABSPEC 1.009  04/09/2015 0004   PHURINE 7.5 04/09/2015 0004   GLUCOSEU NEGATIVE 04/09/2015 0004   HGBUR NEGATIVE 04/09/2015 0004   BILIRUBINUR NEGATIVE 04/09/2015 0004   BILIRUBINUR neg 01/19/2012 1834   KETONESUR NEGATIVE 04/09/2015 0004   PROTEINUR NEGATIVE 04/09/2015 0004   UROBILINOGEN 0.2 01/19/2012 1834   UROBILINOGEN 0.2 09/18/2011 1757   NITRITE NEGATIVE 04/09/2015 0004   LEUKOCYTESUR NEGATIVE 04/09/2015 0004    Radiological Exams on Admission: CT Abdomen Pelvis Wo Contrast  Result Date: 08/10/2020 CLINICAL DATA:  Breast cancer. Staging. Jaundice for 2 days. Breast cancer 2 years ago. EXAM: CT ABDOMEN AND PELVIS WITHOUT CONTRAST TECHNIQUE: Multidetector CT imaging of the abdomen and pelvis was performed following the standard protocol without IV contrast. COMPARISON:  05/21/2020 FINDINGS: Lower chest: Lingular scarring. Normal heart size without pericardial or pleural effusion. Hepatobiliary: Marked hepatic steatosis and moderate hepatomegaly at 19.2 cm craniocaudal. Given lack of IV contrast and the extent of  steatosis, evaluation for liver metastasis is significantly limited. Heterogeneity throughout the liver suggests progressive metastatic disease compared to 05/21/2020. Example area of hypoattenuation within the posterior right hepatic lobe at 12 mm on 29/2. Cholecystectomy, without biliary ductal dilatation. Pancreas: Normal, without mass or ductal dilatation. Spleen: Normal in size, without focal abnormality. Adrenals/Urinary Tract: Normal adrenal glands. No renal calculi or hydronephrosis. No hydroureter or ureteric calculi. No bladder calculi. Stomach/Bowel: Portions of the stomach are underdistended. Submucosal fat prominence within the ascending colon is nonspecific. Normal terminal ileum and appendix. Normal small bowel. Vascular/Lymphatic: Normal caliber of the aorta and branch vessels. No abdominopelvic adenopathy. Reproductive: Normal uterus and adnexa. Other: Development of small volume  abdominopelvic ascites. No evidence of omental or peritoneal disease. No free intraperitoneal air. Musculoskeletal: Widespread sclerotic osseous metastasis again identified. Example in the left pubic bone at 1.9 cm, similar. Sclerosis involving the inferior portion of the L1 vertebral body is felt to be increased, including at 2.3 cm on sagittal image 80. IMPRESSION: 1. Hepatic steatosis and hepatomegaly. Although evaluation for liver lesions is limited secondary to lack of IV contrast and underlying steatosis, the extent of heterogeneity suggests progressive hepatic metastasis compared to 05/21/2020. Pre and post contrast abdominal MRI or PET could confirm. 2. No extrahepatic metastatic disease in the abdomen or pelvis. 3. New small volume abdominopelvic ascites, also suggesting progressive liver disease. 4. Cholecystectomy without biliary duct dilatation. 5. Osseous metastasis, including a more well-defined sclerotic lesion within the L1 vertebral body. This could represent progression or healing of disease. Electronically Signed   By: Abigail Miyamoto M.D.   On: 08/10/2020 14:43      Assessment/Plan  Hyperbilirubinemia/transaminitis -Patient presented as a direct admission from oncology center with jaundice, elevated LFTs and conjugated hyperbilirubinemia suggestive of obstructive jaundice.  However, CT abdomen pelvis without contrast showed no biliary ductal dilatation but likely progression of her liver metastasis compared to previous scan in March -MRI or PET scan was recommended -appreciate oncology recommendation as to utility of further imaging especially in light of IV contrast shortage -test for viral hepatitis althought unlikely cause -Continue to monitor CMP in the morning  Leukopenia -Chronic and stable  Thrombocytopenia - Most likely secondary to worsening hepatic disease  History of breast cancer with mets to ovary, liver and bone -Hold Ibrance and letrozole per oncology -Primary  oncologist Dr. Lindi Adie  Elevated BP Mildly elevated. Likely due to anxiety. Pt has white coat hypertension.  pt normally takes Xanax at home but thinks it does not help Ativan 0.76m PO once   Tachycardia-likely secondary to anxiety. Ativan once. Keep on telemetry for 24 hrs to monitor rate   DVT prophylaxis:.Lovenox Code Status: Full Family Communication: Plan discussed with patient and husband at bedside  disposition Plan: Home with observation Consults called:  Admission status: Observation  Level of care: Med-Surg  Status is: Observation  The patient remains OBS appropriate and will d/c before 2 midnights.  Dispo: The patient is from: Home              Anticipated d/c is to: Home              Patient currently is not medically stable to d/c.   Difficult to place patient No         COrene DesanctisDO [facility]   If 7PM-7AM, please contact night-coverage [email or web address]   08/10/2020, 7:27 PM

## 2020-08-11 ENCOUNTER — Inpatient Hospital Stay (HOSPITAL_COMMUNITY): Payer: BC Managed Care – PPO

## 2020-08-11 DIAGNOSIS — Z8616 Personal history of COVID-19: Secondary | ICD-10-CM | POA: Diagnosis not present

## 2020-08-11 DIAGNOSIS — Z9221 Personal history of antineoplastic chemotherapy: Secondary | ICD-10-CM | POA: Diagnosis not present

## 2020-08-11 DIAGNOSIS — D696 Thrombocytopenia, unspecified: Secondary | ICD-10-CM | POA: Diagnosis present

## 2020-08-11 DIAGNOSIS — K858 Other acute pancreatitis without necrosis or infection: Secondary | ICD-10-CM | POA: Diagnosis present

## 2020-08-11 DIAGNOSIS — K429 Umbilical hernia without obstruction or gangrene: Secondary | ICD-10-CM | POA: Diagnosis present

## 2020-08-11 DIAGNOSIS — Z8543 Personal history of malignant neoplasm of ovary: Secondary | ICD-10-CM | POA: Diagnosis not present

## 2020-08-11 DIAGNOSIS — K219 Gastro-esophageal reflux disease without esophagitis: Secondary | ICD-10-CM | POA: Diagnosis present

## 2020-08-11 DIAGNOSIS — Z9012 Acquired absence of left breast and nipple: Secondary | ICD-10-CM | POA: Diagnosis not present

## 2020-08-11 DIAGNOSIS — I10 Essential (primary) hypertension: Secondary | ICD-10-CM | POA: Diagnosis present

## 2020-08-11 DIAGNOSIS — R17 Unspecified jaundice: Secondary | ICD-10-CM | POA: Diagnosis present

## 2020-08-11 DIAGNOSIS — Z79899 Other long term (current) drug therapy: Secondary | ICD-10-CM | POA: Diagnosis not present

## 2020-08-11 DIAGNOSIS — Z853 Personal history of malignant neoplasm of breast: Secondary | ICD-10-CM | POA: Diagnosis not present

## 2020-08-11 DIAGNOSIS — C787 Secondary malignant neoplasm of liver and intrahepatic bile duct: Secondary | ICD-10-CM | POA: Diagnosis present

## 2020-08-11 DIAGNOSIS — Z8249 Family history of ischemic heart disease and other diseases of the circulatory system: Secondary | ICD-10-CM | POA: Diagnosis not present

## 2020-08-11 DIAGNOSIS — Z808 Family history of malignant neoplasm of other organs or systems: Secondary | ICD-10-CM | POA: Diagnosis not present

## 2020-08-11 DIAGNOSIS — Z923 Personal history of irradiation: Secondary | ICD-10-CM | POA: Diagnosis not present

## 2020-08-11 DIAGNOSIS — K760 Fatty (change of) liver, not elsewhere classified: Secondary | ICD-10-CM | POA: Diagnosis present

## 2020-08-11 DIAGNOSIS — Z90722 Acquired absence of ovaries, bilateral: Secondary | ICD-10-CM | POA: Diagnosis not present

## 2020-08-11 DIAGNOSIS — Z20822 Contact with and (suspected) exposure to covid-19: Secondary | ICD-10-CM | POA: Diagnosis present

## 2020-08-11 DIAGNOSIS — C7951 Secondary malignant neoplasm of bone: Secondary | ICD-10-CM | POA: Diagnosis present

## 2020-08-11 DIAGNOSIS — F419 Anxiety disorder, unspecified: Secondary | ICD-10-CM | POA: Diagnosis present

## 2020-08-11 LAB — HIV ANTIBODY (ROUTINE TESTING W REFLEX): HIV Screen 4th Generation wRfx: NONREACTIVE

## 2020-08-11 LAB — CBC
HCT: 33.2 % — ABNORMAL LOW (ref 36.0–46.0)
Hemoglobin: 11.3 g/dL — ABNORMAL LOW (ref 12.0–15.0)
MCH: 39.4 pg — ABNORMAL HIGH (ref 26.0–34.0)
MCHC: 34 g/dL (ref 30.0–36.0)
MCV: 115.7 fL — ABNORMAL HIGH (ref 80.0–100.0)
Platelets: 126 10*3/uL — ABNORMAL LOW (ref 150–400)
RBC: 2.87 MIL/uL — ABNORMAL LOW (ref 3.87–5.11)
RDW: 17.3 % — ABNORMAL HIGH (ref 11.5–15.5)
WBC: 3.2 10*3/uL — ABNORMAL LOW (ref 4.0–10.5)
nRBC: 0 % (ref 0.0–0.2)

## 2020-08-11 LAB — COMPREHENSIVE METABOLIC PANEL
ALT: 162 U/L — ABNORMAL HIGH (ref 0–44)
AST: 453 U/L — ABNORMAL HIGH (ref 15–41)
Albumin: 2.6 g/dL — ABNORMAL LOW (ref 3.5–5.0)
Alkaline Phosphatase: 312 U/L — ABNORMAL HIGH (ref 38–126)
Anion gap: 7 (ref 5–15)
BUN: 12 mg/dL (ref 6–20)
CO2: 26 mmol/L (ref 22–32)
Calcium: 8.2 mg/dL — ABNORMAL LOW (ref 8.9–10.3)
Chloride: 102 mmol/L (ref 98–111)
Creatinine, Ser: 0.78 mg/dL (ref 0.44–1.00)
GFR, Estimated: >60 mL/min (ref >60)
Glucose, Bld: 99 mg/dL (ref 70–99)
Potassium: 3.4 mmol/L — ABNORMAL LOW (ref 3.5–5.1)
Sodium: 135 mmol/L (ref 135–145)
Total Bilirubin: 13.6 mg/dL — ABNORMAL HIGH (ref 0.3–1.2)
Total Protein: 6 g/dL — ABNORMAL LOW (ref 6.5–8.1)

## 2020-08-11 LAB — SARS CORONAVIRUS 2 (TAT 6-24 HRS): SARS Coronavirus 2: NEGATIVE

## 2020-08-11 LAB — HEPATITIS PANEL, ACUTE
HCV Ab: NONREACTIVE
Hep A IgM: NONREACTIVE
Hep B C IgM: NONREACTIVE
Hepatitis B Surface Ag: NONREACTIVE

## 2020-08-11 MED ORDER — GADOBUTROL 1 MMOL/ML IV SOLN
10.0000 mL | Freq: Once | INTRAVENOUS | Status: AC | PRN
  Administered 2020-08-11: 10 mL via INTRAVENOUS

## 2020-08-11 MED ORDER — LORAZEPAM 2 MG/ML IJ SOLN
0.5000 mg | Freq: Four times a day (QID) | INTRAMUSCULAR | Status: DC | PRN
  Administered 2020-08-11 – 2020-08-12 (×5): 0.5 mg via INTRAVENOUS
  Filled 2020-08-11 (×6): qty 1

## 2020-08-11 NOTE — Progress Notes (Signed)
PROGRESS NOTE    Bethany Hanna  JGG:[id number] DOB: 09-Dec-1980 DOA: 08/10/2020 PCP: [name], MD   Brief Narrative:  40 y.o. 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  Patient reports that about a week ago she felt dizzy at her office and noted her blood pressure to be elevated up to 546 systolic.  She then was seen by an NP in her primary office and started on Norvasc.  However she "did not feel right" after taking it. Then about 4 days ago she begin to note yellowing of her eyes. No worsening abdominal pain but she has a reducible umbilical hernia that hurts at times especially after lifting something heavy. No nausea, vomiting or diarrhea. No fever. No changes in appetite. Still able to eat a steak dinner the other night. Has drank more alcohol recently up to 2-3 glasses of wine due to [employer].  She saw her primary oncology Dr. Lindi Adie today and had lab work showing elevated AST of 146, ALT of 91, alkaline phosphatase of 370.  Total bilirubin of 14.5, Direct bilrubin of 8.9.  CT abdomen pelvis without contrast shows hepatic steatosis and hepatomegaly.  Exam is limited without IV contrast but shows likely progression of hepatic metastasis compared to prior scan in March.  Cholecystectomy without biliary ductal dilatation.  CBC shows Leukopenia of 3.9, thrombocytopenia 136.  Electrolytes otherwise normal. Assessment & Plan:   Principal Problem:   Hyperbilirubinemia Active Problems:   Malignant neoplasm of upper-outer quadrant of left breast in female, estrogen receptor positive (HCC)   Elevated blood pressure reading   Tachycardia   Leukopenia   Thrombocytopenia (HCC)    Transaminitis   Hyperbilirubinemia/transaminitis -Patient presented as a direct admission from oncology center with jaundice, elevated LFTs and conjugated hyperbilirubinemia suggestive of obstructive jaundice.  However, CT abdomen pelvis without contrast showed no biliary ductal dilatation but likely progression of her liver metastasis compared to previous scan in March She has metastatic breast cancer with mets to the liver and bone. Hepatitis panel negative. Appreciate GI consult. Recommending MRI MRCP. Follow-up labs in AM.  Leukopenia -Chronic and stable  Thrombocytopenia - Most likely secondary to worsening hepatic disease  History of breast cancer with mets to ovary, liver and bone -Hold Ibrance and letrozole per oncology -Primary oncologist Dr. Lindi Adie  Elevated BP Mildly elevated. Likely due to anxiety. Pt has white coat hypertension.  pt normally takes Xanax at home but thinks it does not help Ativan 0.5mg  PO every 6 as needed    Estimated body mass index is 33.13 kg/m as calculated from the following:   Height as of an earlier encounter on 08/10/20: 5\' 8"  (1.727 m).   Weight as of an earlier encounter on 08/10/20: 98.8 kg.  DVT prophylaxis: Lovenox Code Status: Full code Family Communication: None at bedside  disposition Plan:  Status is: Inpatient  Dispo: The patient is from: Home              Anticipated d/c is to: Home              Patient currently is not medically stable to d/c.   Difficult to place patient No   Consultants: Oncology and GI    Procedures: None Antimicrobials: None  Subjective: She is resting in bed very anxious  got 1 dose of Ativan seems to have helped no nausea vomiting diarrhea abdominal pain  Objective: Vitals:   08/10/20 2300 08/11/20 0306 08/11/20 0632 08/11/20 1510  BP: 115/72 115/80 122/83 (!) 132/96  Pulse: 99 93 91 (!) 110  Resp: 17 17 17 15   Temp: 97.6 F (36.4 C) 98.2 F (36.8 C) 98.1 F (36.7 C) 98.2 F (36.8 C)   TempSrc: Oral Oral Oral Oral  SpO2: 100% 100% 100% 97%    Intake/Output Summary (Last 24 hours) at 08/11/2020 1609 Last data filed at 08/11/2020 0850 Gross per 24 hour  Intake 828.72 ml  Output --  Net 828.72 ml   There were no vitals filed for this visit.  Examination:  General exam: Appears calm and comfortable  Respiratory system: Clear to auscultation. Respiratory effort normal. Cardiovascular system: S1 & S2 heard, RRR. No JVD, murmurs, rubs, gallops or clicks. No pedal edema. Gastrointestinal system: Abdomen is nondistended, soft and nontender. No organomegaly or masses felt. Normal bowel sounds heard. Central nervous system: Alert and oriented. No focal neurological deficits. Extremities: Symmetric 5 x 5 power. Skin: No rashes, lesions or ulcers Psychiatry: Judgement and insight appear normal. Mood & affect appropriate.     Data Reviewed: I have personally reviewed following labs and imaging studies  CBC: Recent Labs  Lab 08/10/20 0936 08/11/20 0555  WBC 3.9* 3.2*  NEUTROABS 3.2  --   HGB 12.8 11.3*  HCT 35.5* 33.2*  MCV 108.9* 115.7*  PLT 136* 970*   Basic Metabolic Panel: Recent Labs  Lab 08/10/20 0936 08/11/20 0555  NA 133* 135  K 3.9 3.4*  CL 99 102  CO2 22 26  GLUCOSE 88 99  BUN 11 12  CREATININE 0.77 0.78  CALCIUM 8.7* 8.2*   GFR: Estimated Creatinine Clearance: 116.1 mL/min (by C-G formula based on SCr of 0.78 mg/dL). Liver Function Tests: Recent Labs  Lab 08/10/20 0936 08/11/20 0555  AST 507* 453*  ALT 194* 162*  ALKPHOS 370* 312*  BILITOT 14.5* 13.6*  PROT 6.6 6.0*  ALBUMIN 2.6* 2.6*   No results for input(s): LIPASE, AMYLASE in the last 168 hours. No results for input(s): AMMONIA in the last 168 hours. Coagulation Profile: No results for input(s): INR, PROTIME in the last 168 hours. Cardiac Enzymes: No results for input(s): CKTOTAL, CKMB, CKMBINDEX, TROPONINI in the last 168 hours. BNP (last 3 results) No results for input(s):  PROBNP in the last 8760 hours. HbA1C: No results for input(s): HGBA1C in the last 72 hours. CBG: No results for input(s): GLUCAP in the last 168 hours. Lipid Profile: No results for input(s): CHOL, HDL, LDLCALC, TRIG, CHOLHDL, LDLDIRECT in the last 72 hours. Thyroid Function Tests: No results for input(s): TSH, T4TOTAL, FREET4, T3FREE, THYROIDAB in the last 72 hours. Anemia Panel: No results for input(s): VITAMINB12, FOLATE, FERRITIN, TIBC, IRON, RETICCTPCT in the last 72 hours. Sepsis Labs: No results for input(s): PROCALCITON, LATICACIDVEN in the last 168 hours.  Recent Results (from the past 240 hour(s))  SARS CORONAVIRUS 2 (TAT 6-24 HRS) Nasopharyngeal Nasopharyngeal Swab     Status: None   Collection Time: 08/10/20  8:16 PM   Specimen: Nasopharyngeal Swab  Result Value Ref Range Status   SARS Coronavirus 2 NEGATIVE NEGATIVE Final    Comment: (NOTE) SARS-CoV-2 target nucleic acids are NOT DETECTED.  The SARS-CoV-2 RNA is generally detectable in upper and lower respiratory specimens during the acute phase of infection. Negative results do not preclude SARS-CoV-2 infection, do not rule out co-infections with  other pathogens, and should not be used as the sole basis for treatment or other patient management decisions. Negative results must be combined with clinical observations, patient history, and epidemiological information. The expected result is Negative.  Fact Sheet for Patients: SugarRoll.be  Fact Sheet for Healthcare Providers: https://www.[employer] [employer].com/  This test is not yet approved or cleared by the [place] FDA and  has been authorized for detection and/or diagnosis of SARS-CoV-2 by FDA under an Emergency Use Authorization (EUA). This EUA will remain  in effect (meaning this test can be used) for the duration of the COVID-19 declaration under Se ction 564(b)(1) of the Act, 21 U.S.C. section 360bbb-3(b)(1), unless  the authorization is terminated or revoked sooner.  Performed at [facility], [place] [address] [place], [place] [address]          Radiology Studies: CT Abdomen Pelvis Wo Contrast  Result Date: 08/10/2020 CLINICAL DATA:  Breast cancer. Staging. Jaundice for 2 days. Breast cancer 2 years ago. EXAM: CT ABDOMEN AND PELVIS WITHOUT CONTRAST TECHNIQUE: Multidetector CT imaging of the abdomen and pelvis was performed following the standard protocol without IV contrast. COMPARISON:  05/21/2020 FINDINGS: Lower chest: Lingular scarring. Normal heart size without pericardial or pleural effusion. Hepatobiliary: Marked hepatic steatosis and moderate hepatomegaly at 19.2 cm craniocaudal. Given lack of IV contrast and the extent of steatosis, evaluation for liver metastasis is significantly limited. Heterogeneity throughout the liver suggests progressive metastatic disease compared to 05/21/2020. Example area of hypoattenuation within the posterior right hepatic lobe at 12 mm on 29/2. Cholecystectomy, without biliary ductal dilatation. Pancreas: Normal, without mass or ductal dilatation. Spleen: Normal in size, without focal abnormality. Adrenals/Urinary Tract: Normal adrenal glands. No renal calculi or hydronephrosis. No hydroureter or ureteric calculi. No bladder calculi. Stomach/Bowel: Portions of the stomach are underdistended. Submucosal fat prominence within the ascending colon is nonspecific. Normal terminal ileum and appendix. Normal small bowel. Vascular/Lymphatic: Normal caliber of the aorta and branch vessels. No abdominopelvic adenopathy. Reproductive: Normal uterus and adnexa. Other: Development of small volume abdominopelvic ascites. No evidence of omental or peritoneal disease. No free intraperitoneal air. Musculoskeletal: Widespread sclerotic osseous metastasis again identified. Example in the left pubic bone at 1.9 cm, similar. Sclerosis involving the inferior portion of the L1 vertebral body  is felt to be increased, including at 2.3 cm on sagittal image 80. IMPRESSION: 1. Hepatic steatosis and hepatomegaly. Although evaluation for liver lesions is limited secondary to lack of IV contrast and underlying steatosis, the extent of heterogeneity suggests progressive hepatic metastasis compared to 05/21/2020. Pre and post contrast abdominal MRI or PET could confirm. 2. No extrahepatic metastatic disease in the abdomen or pelvis. 3. New small volume abdominopelvic ascites, also suggesting progressive liver disease. 4. Cholecystectomy without biliary duct dilatation. 5. Osseous metastasis, including a more well-defined sclerotic lesion within the L1 vertebral body. This could represent progression or healing of disease. Electronically Signed   By: Abigail Miyamoto M.D.   On: 08/10/2020 14:43        Scheduled Meds: . enoxaparin (LOVENOX) injection  40 mg Subcutaneous Q24H   Continuous Infusions: . sodium chloride 75 mL/hr at 08/11/20 0945     LOS: 1 day   Georgette Shell, MD 08/11/2020, 4:09 PM

## 2020-08-11 NOTE — Consult Note (Signed)
Referring Provider: Dr. Ileene Musa Primary Care Physician:  [name], MD Primary Gastroenterologist:  none  Reason for Consultation:  Painless jaundince  HPI: Bethany Hanna is a 40 y.o. female breast cancer dx on 05/2018 s/p lumpectomy (11/2018), chemotherapy, radiation later with metastasis to ovary, liver and bone s/p bilateral salpingo-oophorectomy who presents as a direct admission from oncology office for jaundice, elevated LFTs and hyperbilirubinemia.  Patient currently following Dr. Payton Mccallum for her metastatic breast cancer.   Patient currently on letrozole and Ibrance 2020 which, has been held once yesterday.    Patient had COVID May 6, is fully vaccinated and boosted.  Got monoclonal antibodies and states was taking a lot of Tylenol and ibuprofen at that time taking 2-4 Tylenol per day for a week.  Which is unusual for her.  Patient then had dizziness and felt badly about a week ago with elevated blood pressure.  Took 2 days of amlodipine but felt worse.  And started noticing yellowing of her eyes.  Patient has noticed darker urine.  And 1 day of diarrhea took Pepto-Bismol.  Otherwise stools have been normal.  No blood in stool.  Denies abdominal pain, other than occasional periumbilical hernia discomfort. Denies nausea, vomiting  No fever.  Has no changes in appetite. Patient will normally have a glass of wine with dinner up to 3 nights a week, Memorial Day holiday had closer to 2 to 3 glasses released over the weekend  lab work showing elevated AST of 146, ALT of 91, alkaline phosphatase of 370.  Total bilirubin of 14.5, Direct bilrubin of 8.9. Today AST 453, ALT 162, alk phos 321, bilirubin 13.6, slightly trending down.  Negative acute hepatitis panel.   CT AB and Pelvis 08/11/20 IMPRESSION: 1. Hepatic steatosis and hepatomegaly. Although evaluation for liver lesions is limited secondary to lack of IV contrast and underlying steatosis, the extent of heterogeneity suggests  progressive hepatic metastasis compared to 05/21/2020. Pre and post contrast abdominal MRI or PET could confirm. 2. No extrahepatic metastatic disease in the abdomen or pelvis. 3. New small volume abdominopelvic ascites, also suggesting progressive liver disease. 4. Cholecystectomy without biliary duct dilatation. 5. Osseous metastasis, including a more well-defined sclerotic lesion within the L1 vertebral body. This could represent progression or healing of disease.  PET 01/2020 IMPRESSION: 1. Overall progressive malignancy with new metastatic lesions in the liver and skeleton. 2. However, faintly hypermetabolic lymph nodes in the right neck have essentially resolved, and the previously metabolic airspace opacities in the left lung have essentially resolved. 3. Distal esophageal metabolic activity, probably physiologic or from esophagitis. 4. Radiation therapy related findings in the left hemithorax.  Past Medical History:  Diagnosis Date  . Anxiety    SITUational -uses xanax during the day, reports the ativan is for bedtime  . Elevated blood pressure reading in office with white coat syndrome, without diagnosis of hypertension   . Family history of colon cancer   . Family history of melanoma   . GERD (gastroesophageal reflux disease)   . History of cancer chemotherapy    left breast cancer--- 05-28-2018 to 10-08-2018  . History of migraine   . Hypertension   . Malignant neoplasm of upper-outer quadrant of left breast in female, estrogen receptor positive [facility]) oncologist--- dr Lindi Adie   dx 05-13-2018, clinical stage IIB, grade 3, invasive mammary carcinoma (ER/PR +, HER2 negative);  completed chemotherapy 10-08-2018;  11-05-2018 s/p left breast lumpectomy w/ node dissection's and Re-excision 11-18-2018;  started radiation therapy 12-30-2018 to  complete 02-14-2019  . PONV (postoperative nausea and vomiting)   . Radiation burn    02-13-2019  per pt left breast , current radiation  treatment    Past Surgical History:  Procedure Laterality Date  . BREAST LUMPECTOMY WITH RADIOACTIVE SEED AND AXILLARY LYMPH NODE DISSECTION Left 11/05/2018   Procedure: LEFT BREAST LUMPECTOMY WITH BRACKETED RADIOACTIVE SEED AND LEFT AXILLARY LYMPH NODE DISSECTION;  Surgeon: [name], MD;  Location: [place];  Service: General;  Laterality: Left;  . CHOLECYSTECTOMY N/A 04/09/2015   Procedure: LAPAROSCOPIC CHOLECYSTECTOMY WITH INTRAOPERATIVE CHOLANGIOGRAM;  Surgeon: Autumn Messing III, MD;  Location: [facility];  Service: General;  Laterality: N/A;  . DILATATION & CURRETTAGE/HYSTEROSCOPY WITH RESECTOCOPE N/A 07/31/2013   Procedure: [place];  Surgeon: Princess Bruins, MD;  Location: [place] [facility];  Service: Gynecology;  Laterality: N/A;  1 hr.  Marland Kitchen DILATION AND EVACUATION N/A 12/12/2017   Procedure: DILATATION AND EVACUATION (D&E) 2ND TRIMESTER;  Surgeon: Brien Few, MD;  Location: [place] [facility];  Service: Gynecology;  Laterality: N/A;  . OPERATIVE ULTRASOUND N/A 12/12/2017   Procedure: OPERATIVE ULTRASOUND;  Surgeon: Brien Few, MD;  Location: [place] [facility];  Service: Gynecology;  Laterality: N/A;  . PORT-A-CATH REMOVAL Right 11/05/2018   Procedure: REMOVAL PORT-A-CATH;  Surgeon: [name], MD;  Location: [place];  Service: General;  Laterality: Right;  . PORTACATH PLACEMENT Right 05/27/2018   Procedure: INSERTION PORT-A-CATH WITH ULTRASOUND;  Surgeon: [name], MD;  Location: [place];  Service: General;  Laterality: Right;  GENERAL AND LMA  . RE-EXCISION OF BREAST CANCER,SUPERIOR MARGINS Left 11/18/2018   Procedure: RE-EXCISION OF LEFT BREAST MARGIN;  Surgeon: [name], MD;  Location: [place];  Service: General;  Laterality: Left;  . ROBOTIC ASSISTED SALPINGO OOPHERECTOMY Bilateral 02/18/2019   Procedure: XI ROBOTIC ASSISTED SALPINGO OOPHORECTOMY;  Surgeon: Brien Few, MD;  Location: [place];  Service:  Gynecology;  Laterality: Bilateral;  Requests 1 hr. RNFA Tracie confirmed on 02/13/19 CS    Prior to Admission medications   Medication Sig Start Date End Date Taking? Authorizing Provider  ALPRAZolam Duanne Moron) 0.5 MG tablet Take 1 tablet (0.5 mg total) by mouth at bedtime as needed for anxiety. Patient taking differently: Take 0.5 mg by mouth daily as needed for anxiety. 01/20/20  Yes [name], MD  calcium carbonate (TUMS - DOSED IN MG ELEMENTAL CALCIUM) 500 MG chewable tablet Chew 1 tablet by mouth as needed for indigestion or heartburn.    Yes [provider]  ibuprofen (ADVIL) 200 MG tablet Take 400-600 mg by mouth every 6 (six) hours as needed for fever, headache or mild pain.   Yes [provider]  Multiple Vitamins-Minerals (WOMENS MULTI VITAMIN & MINERAL) TABS Take 1 tablet by mouth daily.   Yes [provider]  polyvinyl alcohol (LIQUIFILM TEARS) 1.4 % ophthalmic solution Place 1 drop into both eyes as needed for dry eyes.   Yes [provider]  Biotin 10 MG CAPS Take by mouth.    [provider]  traZODone (DESYREL) 50 MG tablet Take 0.5-1 tablets (25-50 mg total) by mouth at bedtime as needed for sleep. Patient not taking: Reported on 08/10/2020 08/04/20   Maximiano Coss, NP    Scheduled Meds: . enoxaparin (LOVENOX) injection  40 mg Subcutaneous Q24H   Continuous Infusions: . sodium chloride 75 mL/hr at 08/11/20 0945   PRN Meds:.calcium carbonate, LORazepam, morphine injection  Allergies as of 08/10/2020  . (No Known Allergies)    Family History  Problem Relation Age of Onset  . Arthritis Mother   . Migraines Mother   . ADD / ADHD Mother   . Varicose Veins Mother   . Hypertension Father   . Alcohol abuse Brother   . Varicose Veins Brother   . Stroke Maternal Grandmother   . Melanoma Maternal Grandmother   . Pulmonary fibrosis Maternal Grandfather   . Mitral valve prolapse Paternal Grandmother   . Migraines Paternal  Grandmother   . Lymphoma Paternal Grandfather        was treated for this and did ok  . Heart failure Paternal Grandfather   . Eczema Son   . Colon cancer Maternal Aunt 27    Social History   Socioeconomic History  . Marital status: Married    Spouse name: Not on file  . Number of children: Not on file  . Years of education: Not on file  . Highest education level: Not on file  Occupational History  . Not on file  Tobacco Use  . Smoking status: Never Smoker  . Smokeless tobacco: Never Used  Vaping Use  . Vaping Use: Never used  Substance and Sexual Activity  . Alcohol use: Yes    Alcohol/week: 2.0 - 3.0 standard drinks    Types: 2 - 3 Glasses of wine per week  . Drug use: No  . Sexual activity: Yes  Other Topics Concern  . Not on file  Social History Narrative   Married- see husband Catalina [place]- have 75 and 59 year old in 2020.       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  Eyes: Negative for redness.  Respiratory: Negative for shortness of breath.   Cardiovascular: Negative for chest pain and leg swelling.  Gastrointestinal: Negative for abdominal pain, blood in stool, constipation, diarrhea, melena, nausea and vomiting.  Genitourinary: Negative for dysuria.  Musculoskeletal: Negative for falls.  Skin: Negative for itching.  Neurological: Negative for loss of consciousness.  Psychiatric/Behavioral: Negative for memory loss.     Physical Exam: Vital signs: Vitals:   08/11/20 0632 08/11/20 1510  BP:  122/83 (!) 132/96  Pulse: 91 (!) 110  Resp: 17 15  Temp: 98.1 F (36.7 C) 98.2 F (36.8 C)  SpO2: 100% 97%   Last BM Date: 08/11/20 Physical Exam Constitutional:      General: She is not in acute distress.    Appearance: Normal appearance.  Eyes:     General: Scleral icterus present.  Cardiovascular:     Rate and Rhythm: Normal rate and regular rhythm.     Pulses: Normal pulses.     Heart sounds: Normal heart sounds.  Pulmonary:     Effort: Pulmonary effort is normal.     Breath sounds: Normal breath sounds.  Abdominal:     General: Bowel sounds are normal.     Palpations: Abdomen is soft.     Tenderness: There is no abdominal tenderness.  Musculoskeletal:        General: Normal range  of motion.  Skin:    General: Skin is warm and dry.     Coloration: Skin is jaundiced.  Neurological:     General: No focal deficit present.     Mental Status: She is alert and oriented to person, place, and time.  Psychiatric:        Mood and Affect: Mood normal.        Behavior: Behavior normal.      GI:  Lab Results: Recent Labs    08/10/20 0936 08/11/20 0555  WBC 3.9* 3.2*  HGB 12.8 11.3*  HCT 35.5* 33.2*  PLT 136* 126*   BMET Recent Labs    08/10/20 0936 08/11/20 0555  NA 133* 135  K 3.9 3.4*  CL 99 102  CO2 22 26  GLUCOSE 88 99  BUN 11 12  CREATININE 0.77 0.78  CALCIUM 8.7* 8.2*   LFT Recent Labs    08/10/20 1014 08/11/20 0555  PROT  --  6.0*  ALBUMIN  --  2.6*  AST  --  453*  ALT  --  162*  ALKPHOS  --  312*  BILITOT  --  13.6*  BILIDIR 8.9*  --    PT/INR No results for input(s): LABPROT, INR in the last 72 hours.  Studies/Results: CT Abdomen Pelvis Wo Contrast  Result Date: 08/10/2020 CLINICAL DATA:  Breast cancer. Staging. Jaundice for 2 days. Breast cancer 2 years ago. EXAM: CT ABDOMEN AND PELVIS WITHOUT CONTRAST TECHNIQUE: Multidetector CT imaging of the abdomen and pelvis was performed following the standard protocol without IV contrast.  COMPARISON:  05/21/2020 FINDINGS: Lower chest: Lingular scarring. Normal heart size without pericardial or pleural effusion. Hepatobiliary: Marked hepatic steatosis and moderate hepatomegaly at 19.2 cm craniocaudal. Given lack of IV contrast and the extent of steatosis, evaluation for liver metastasis is significantly limited. Heterogeneity throughout the liver suggests progressive metastatic disease compared to 05/21/2020. Example area of hypoattenuation within the posterior right hepatic lobe at 12 mm on 29/2. Cholecystectomy, without biliary ductal dilatation. Pancreas: Normal, without mass or ductal dilatation. Spleen: Normal in size, without focal abnormality. Adrenals/Urinary Tract: Normal adrenal glands. No renal calculi or hydronephrosis. No hydroureter or ureteric calculi. No bladder calculi. Stomach/Bowel: Portions of the stomach are underdistended. Submucosal fat prominence within the ascending colon is nonspecific. Normal terminal ileum and appendix. Normal small bowel. Vascular/Lymphatic: Normal caliber of the aorta and branch vessels. No abdominopelvic adenopathy. Reproductive: Normal uterus and adnexa. Other: Development of small volume abdominopelvic ascites. No evidence of omental or peritoneal disease. No free intraperitoneal air. Musculoskeletal: Widespread sclerotic osseous metastasis again identified. Example in the left pubic bone at 1.9 cm, similar. Sclerosis involving the inferior portion of the L1 vertebral body is felt to be increased, including at 2.3 cm on sagittal image 80. IMPRESSION: 1. Hepatic steatosis and hepatomegaly. Although evaluation for liver lesions is limited secondary to lack of IV contrast and underlying steatosis, the extent of heterogeneity suggests progressive hepatic metastasis compared to 05/21/2020. Pre and post contrast abdominal MRI or PET could confirm. 2. No extrahepatic metastatic disease in the abdomen or pelvis. 3. New small volume abdominopelvic ascites, also  suggesting progressive liver disease. 4. Cholecystectomy without biliary duct dilatation. 5. Osseous metastasis, including a more well-defined sclerotic lesion within the L1 vertebral body. This could represent progression or healing of disease. Electronically Signed   By: Abigail Miyamoto M.D.   On: 08/10/2020 14:43    Impression and Plan Painless jaundice with history of metastatic breast cancer to liver, bone  AST 453, ALT 162, alk phos 321, bilirubin 13.6 trending down Negative acute hepatitis panel CT without evidence of biliary dilatation, very acute onset without abdominal pain, nausea vomiting, fever. Will get MRI MRCP to evaluate for possible stricture, obstruction, possible worsening parenchymal disease, metastases, possible medically induced hepatocelluar disease Continue to trend liver function and supportive care.    LOS: 1 day   Vladimir Crofts  PA-C 08/11/2020, 4:30 PM  Contact #  [phone]

## 2020-08-11 NOTE — Progress Notes (Signed)
Oncology Subjective: Patient continues to have jaundice Objective: HEENT: Jaundice Lungs clear Heart S1 S2 normal Abdomen soft nontender nondistended Extremities no edema Neurological exam grossly intact  CBC    Component Value Date/Time   WBC 3.2 (L) 08/11/2020 0555   RBC 2.87 (L) 08/11/2020 0555   HGB 11.3 (L) 08/11/2020 0555   HGB 12.8 08/10/2020 0936   HCT 33.2 (L) 08/11/2020 0555   PLT 126 (L) 08/11/2020 0555   PLT 136 (L) 08/10/2020 0936   MCV 115.7 (H) 08/11/2020 0555   MCV 91.6 01/19/2012 1716   MCH 39.4 (H) 08/11/2020 0555   MCHC 34.0 08/11/2020 0555   RDW 17.3 (H) 08/11/2020 0555   LYMPHSABS 0.4 (L) 08/10/2020 0936   MONOABS 0.2 08/10/2020 0936   EOSABS 0.0 08/10/2020 0936   BASOSABS 0.0 08/10/2020 0936   CMP Latest Ref Rng & Units 08/11/2020 08/10/2020 06/01/2020  Glucose 70 - 99 mg/dL 99 88 99  BUN 6 - 20 mg/dL 12 11 9   Creatinine 0.44 - 1.00 mg/dL 0.78 0.77 0.79  Sodium 135 - 145 mmol/L 135 133(L) 139  Potassium 3.5 - 5.1 mmol/L 3.4(L) 3.9 3.7  Chloride 98 - 111 mmol/L 102 99 102  CO2 22 - 32 mmol/L 26 22 25   Calcium 8.9 - 10.3 mg/dL 8.2(L) 8.7(L) 9.6  Total Protein 6.5 - 8.1 g/dL 6.0(L) 6.6 7.7  Total Bilirubin 0.3 - 1.2 mg/dL 13.6(H) 14.5(HH) 0.6  Alkaline Phos 38 - 126 U/L 312(H) 370(H) 76  AST 15 - 41 U/L 453(H) 507(HH) 65(H)  ALT 0 - 44 U/L 162(H) 194(H) 123(H)   Plan 1.  Obstructive jaundice: Awaiting gastroenterology consultation. 2. metastatic breast cancer with liver and bone metastasis We stopped Ibrance and letrozole. Once her liver function normalizes we can talk about resuming treatment at a lower dosage.  Will follow

## 2020-08-12 ENCOUNTER — Other Ambulatory Visit: Payer: Self-pay | Admitting: Pharmacist

## 2020-08-12 ENCOUNTER — Encounter: Payer: Self-pay | Admitting: Hematology and Oncology

## 2020-08-12 ENCOUNTER — Other Ambulatory Visit (HOSPITAL_COMMUNITY): Payer: Self-pay

## 2020-08-12 ENCOUNTER — Other Ambulatory Visit: Payer: Self-pay | Admitting: Hematology and Oncology

## 2020-08-12 ENCOUNTER — Other Ambulatory Visit: Payer: Self-pay | Admitting: *Deleted

## 2020-08-12 ENCOUNTER — Telehealth: Payer: Self-pay | Admitting: Pharmacist

## 2020-08-12 DIAGNOSIS — Z170 Estrogen receptor positive status [ER+]: Secondary | ICD-10-CM

## 2020-08-12 DIAGNOSIS — C50412 Malignant neoplasm of upper-outer quadrant of left female breast: Secondary | ICD-10-CM

## 2020-08-12 LAB — COMPREHENSIVE METABOLIC PANEL
ALT: 154 U/L — ABNORMAL HIGH (ref 0–44)
AST: 426 U/L — ABNORMAL HIGH (ref 15–41)
Albumin: 2.4 g/dL — ABNORMAL LOW (ref 3.5–5.0)
Alkaline Phosphatase: 309 U/L — ABNORMAL HIGH (ref 38–126)
Anion gap: 8 (ref 5–15)
BUN: 8 mg/dL (ref 6–20)
CO2: 24 mmol/L (ref 22–32)
Calcium: 7.8 mg/dL — ABNORMAL LOW (ref 8.9–10.3)
Chloride: 105 mmol/L (ref 98–111)
Creatinine, Ser: 0.53 mg/dL (ref 0.44–1.00)
GFR, Estimated: >60 mL/min (ref >60)
Glucose, Bld: 87 mg/dL (ref 70–99)
Potassium: 3.3 mmol/L — ABNORMAL LOW (ref 3.5–5.1)
Sodium: 137 mmol/L (ref 135–145)
Total Bilirubin: 14.4 mg/dL — ABNORMAL HIGH (ref 0.3–1.2)
Total Protein: 6.1 g/dL — ABNORMAL LOW (ref 6.5–8.1)

## 2020-08-12 LAB — CBC
HCT: 34.3 % — ABNORMAL LOW (ref 36.0–46.0)
Hemoglobin: 11.8 g/dL — ABNORMAL LOW (ref 12.0–15.0)
MCH: 39.3 pg — ABNORMAL HIGH (ref 26.0–34.0)
MCHC: 34.4 g/dL (ref 30.0–36.0)
MCV: 114.3 fL — ABNORMAL HIGH (ref 80.0–100.0)
Platelets: 130 10*3/uL — ABNORMAL LOW (ref 150–400)
RBC: 3 MIL/uL — ABNORMAL LOW (ref 3.87–5.11)
RDW: 17.7 % — ABNORMAL HIGH (ref 11.5–15.5)
WBC: 3.1 10*3/uL — ABNORMAL LOW (ref 4.0–10.5)
nRBC: 0 % (ref 0.0–0.2)

## 2020-08-12 MED ORDER — OXYCODONE HCL 5 MG PO TABS: 5.0000 mg | ORAL_TABLET | ORAL | 0 refills | Status: AC | PRN

## 2020-08-12 MED ORDER — LORAZEPAM 1 MG PO TABS: 1.0000 mg | ORAL_TABLET | Freq: Three times a day (TID) | ORAL | 0 refills | Status: DC | PRN

## 2020-08-12 MED ORDER — LORAZEPAM 1 MG PO TABS
1.0000 mg | ORAL_TABLET | Freq: Three times a day (TID) | ORAL | 0 refills | Status: AC | PRN
Start: 2020-08-12 — End: ?

## 2020-08-12 MED ORDER — ENTRECTINIB 200 MG PO CAPS
600.0000 mg | ORAL_CAPSULE | Freq: Every day | ORAL | 6 refills | Status: DC
  Filled 2020-08-12: qty 90, 30d supply, fill #0

## 2020-08-12 NOTE — Telephone Encounter (Signed)
Oral Oncology Pharmacist Encounter  Received notification from [facility] that prior authorization for Rozlytrek (entrectinib) is required.   PA submitted on CoverMyMeds Key [id number] Status is pending   Oral Oncology Clinic will continue to follow.  Leron Croak, PharmD, BCPS Hematology/Oncology Clinical Pharmacist [facility] [phone] 08/12/2020 4:51 PM

## 2020-08-12 NOTE — Progress Notes (Signed)
Radiology called results of MRCP. Dr Zigmund Daniel made aware.

## 2020-08-12 NOTE — Progress Notes (Signed)
[facility] Gastroenterology Progress Note  Bethany Hanna 40 y.o. 02-06-1981  CC:  Painless Jaundice   Subjective: Patient is eating and drinking without nausea and vomiting.  Denies abdominal pain.  No fevers or chills. She denies any colitis symptoms.  Had well formed brown stool this morning. Patient has been on turmeric supplement outpatient.  MRI/MRCP 08/12/2020 IMPRESSION: 1. Signs of acute interstitial pancreatitis without focal fluid collection but with ascites and edema as described. 2. Moderate to marked hepatic steatosis with a background of innumerable metastatic foci of variable size. No biliary duct dilation. Extensive tumor in the liver may explain elevated bilirubin. Consider MR follow-up for tracking of liver metastases given that many of these are quite small and PET scan and CT, particularly in the setting of steatosis with respect to CT assessment, may underestimate the degree of metastatic involvement. 3. Signs of bony metastatic disease. 4. Above findings appear to have worsened since more remote imaging from November. 5. Signs of potential mild colitis, correlate with any symptoms that would suggest portal colopathy in the setting of diffuse hepatic metastases. 6. Enlarged portacaval lymph node.   Labs are pending.    ROS : Review of Systems  Constitutional:  Negative for chills and fever.  HENT:  Negative for hearing loss.   Respiratory:  Negative for shortness of breath.   Cardiovascular:  Negative for chest pain and leg swelling.  Gastrointestinal:  Negative for abdominal pain, constipation, diarrhea, heartburn, nausea and vomiting.  Skin:  Negative for itching.     Objective: Vital signs in last 24 hours: Vitals:   08/11/20 2042 08/12/20 0528  BP: 132/87 119/76  Pulse: (!) 107 97  Resp: 16 16  Temp: 98.5 F (36.9 C) 98.4 F (36.9 C)  SpO2: 97% 97%    Physical Exam: Physical Exam Constitutional:      Appearance: Normal appearance.   HENT:     Head: Normocephalic and atraumatic.  Eyes:     General: Scleral icterus present.  Abdominal:     Palpations: Abdomen is soft.     Tenderness: There is no abdominal tenderness. There is no guarding or rebound.  Musculoskeletal:        General: Normal range of motion.  Skin:    General: Skin is warm.     Coloration: Skin is jaundiced.  Neurological:     General: No focal deficit present.     Mental Status: She is alert and oriented to person, place, and time.     Lab Results: Recent Labs    08/10/20 0936 08/11/20 0555  NA 133* 135  K 3.9 3.4*  CL 99 102  CO2 22 26  GLUCOSE 88 99  BUN 11 12  CREATININE 0.77 0.78  CALCIUM 8.7* 8.2*   Recent Labs    08/10/20 0936 08/11/20 0555  AST 507* 453*  ALT 194* 162*  ALKPHOS 370* 312*  BILITOT 14.5* 13.6*  PROT 6.6 6.0*  ALBUMIN 2.6* 2.6*   Recent Labs    08/10/20 0936 08/11/20 0555  WBC 3.9* 3.2*  NEUTROABS 3.2  --   HGB 12.8 11.3*  HCT 35.5* 33.2*  MCV 108.9* 115.7*  PLT 136* 126*   No results for input(s): LABPROT, INR in the last 72 hours.  Lab Results: Results for orders placed or performed during the hospital encounter of 08/10/20 (from the past 48 hour(s))  Hepatitis panel, acute     Status: None   Collection Time: 08/10/20  7:46 PM  Result Value Ref Range  Hepatitis B Surface Ag NON REACTIVE NON REACTIVE   HCV Ab NON REACTIVE NON REACTIVE    Comment: (NOTE) Nonreactive HCV antibody screen is consistent with no HCV infections,  unless recent infection is suspected or other evidence exists to indicate HCV infection.     Hep A IgM NON REACTIVE NON REACTIVE   Hep B C IgM NON REACTIVE NON REACTIVE    Comment: Performed at [facility], [place] [address], [place], [place] [address]  SARS CORONAVIRUS 2 (TAT 6-24 HRS) Nasopharyngeal Nasopharyngeal Swab     Status: None   Collection Time: 08/10/20  8:16 PM   Specimen: Nasopharyngeal Swab  Result Value Ref Range   SARS Coronavirus 2 NEGATIVE  NEGATIVE    Comment: (NOTE) SARS-CoV-2 target nucleic acids are NOT DETECTED.  The SARS-CoV-2 RNA is generally detectable in upper and lower respiratory specimens during the acute phase of infection. Negative results do not preclude SARS-CoV-2 infection, do not rule out co-infections with other pathogens, and should not be used as the sole basis for treatment or other patient management decisions. Negative results must be combined with clinical observations, patient history, and epidemiological information. The expected result is Negative.  Fact Sheet for Patients: SugarRoll.be  Fact Sheet for Healthcare Providers: https://www.woods-mathews.com/  This test is not yet approved or cleared by the [place] FDA and  has been authorized for detection and/or diagnosis of SARS-CoV-2 by FDA under an Emergency Use Authorization (EUA). This EUA will remain  in effect (meaning this test can be used) for the duration of the COVID-19 declaration under Se ction 564(b)(1) of the Act, 21 U.S.C. section 360bbb-3(b)(1), unless the authorization is terminated or revoked sooner.  Performed at [facility], [place] [address], [place], [place] [address]   HIV Antibody (routine testing w rflx)     Status: None   Collection Time: 08/11/20  5:55 AM  Result Value Ref Range   HIV Screen 4th Generation wRfx Non Reactive Non Reactive    Comment: Performed at [facility], [place] [address], [place], [place] [address]  Comprehensive metabolic panel     Status: Abnormal   Collection Time: 08/11/20  5:55 AM  Result Value Ref Range   Sodium 135 135 - 145 mmol/L   Potassium 3.4 (L) 3.5 - 5.1 mmol/L   Chloride 102 98 - 111 mmol/L   CO2 26 22 - 32 mmol/L   Glucose, Bld 99 70 - 99 mg/dL    Comment: Glucose reference range applies only to samples taken after fasting for at least 8 hours.   BUN 12 6 - 20 mg/dL   Creatinine, Ser 0.78 0.44 - 1.00 mg/dL   Calcium  8.2 (L) 8.9 - 10.3 mg/dL   Total Protein 6.0 (L) 6.5 - 8.1 g/dL   Albumin 2.6 (L) 3.5 - 5.0 g/dL   AST 453 (H) 15 - 41 U/L   ALT 162 (H) 0 - 44 U/L   Alkaline Phosphatase 312 (H) 38 - 126 U/L   Total Bilirubin 13.6 (H) 0.3 - 1.2 mg/dL   GFR, Estimated >60 >60 mL/min    Comment: (NOTE) Calculated using the CKD-EPI Creatinine Equation (2021)    Anion gap 7 5 - 15    Comment: Performed at [facility], [place] [address] [place], [place] [address]  CBC     Status: Abnormal   Collection Time: 08/11/20  5:55 AM  Result Value Ref Range   WBC 3.2 (L) 4.0 -  10.5 K/uL   RBC 2.87 (L) 3.87 - 5.11 MIL/uL   Hemoglobin 11.3 (L) 12.0 - 15.0 g/dL   HCT 33.2 (L) 36.0 - 46.0 %   MCV 115.7 (H) 80.0 - 100.0 fL   MCH 39.4 (H) 26.0 - 34.0 pg   MCHC 34.0 30.0 - 36.0 g/dL   RDW 17.3 (H) 11.5 - 15.5 %   Platelets 126 (L) 150 - 400 K/uL   nRBC 0.0 0.0 - 0.2 %    Comment: Performed at [facility], [place] [address], [place],  [address]    Studies/Results: CT Abdomen Pelvis Wo Contrast  Result Date: 08/10/2020 CLINICAL DATA:  Breast cancer. Staging. Jaundice for 2 days. Breast cancer 2 years ago. EXAM: CT ABDOMEN AND PELVIS WITHOUT CONTRAST TECHNIQUE: Multidetector CT imaging of the abdomen and pelvis was performed following the standard protocol without IV contrast. COMPARISON:  05/21/2020 FINDINGS: Lower chest: Lingular scarring. Normal heart size without pericardial or pleural effusion. Hepatobiliary: Marked hepatic steatosis and moderate hepatomegaly at 19.2 cm craniocaudal. Given lack of IV contrast and the extent of steatosis, evaluation for liver metastasis is significantly limited. Heterogeneity throughout the liver suggests progressive metastatic disease compared to 05/21/2020. Example area of hypoattenuation within the posterior right hepatic lobe at 12 mm on 29/2. Cholecystectomy, without biliary ductal dilatation. Pancreas: Normal, without mass or ductal  dilatation. Spleen: Normal in size, without focal abnormality. Adrenals/Urinary Tract: Normal adrenal glands. No renal calculi or hydronephrosis. No hydroureter or ureteric calculi. No bladder calculi. Stomach/Bowel: Portions of the stomach are underdistended. Submucosal fat prominence within the ascending colon is nonspecific. Normal terminal ileum and appendix. Normal small bowel. Vascular/Lymphatic: Normal caliber of the aorta and branch vessels. No abdominopelvic adenopathy. Reproductive: Normal uterus and adnexa. Other: Development of small volume abdominopelvic ascites. No evidence of omental or peritoneal disease. No free intraperitoneal air. Musculoskeletal: Widespread sclerotic osseous metastasis again identified. Example in the left pubic bone at 1.9 cm, similar. Sclerosis involving the inferior portion of the L1 vertebral body is felt to be increased, including at 2.3 cm on sagittal image 80. IMPRESSION: 1. Hepatic steatosis and hepatomegaly. Although evaluation for liver lesions is limited secondary to lack of IV contrast and underlying steatosis, the extent of heterogeneity suggests progressive hepatic metastasis compared to 05/21/2020. Pre and post contrast abdominal MRI or PET could confirm. 2. No extrahepatic metastatic disease in the abdomen or pelvis. 3. New small volume abdominopelvic ascites, also suggesting progressive liver disease. 4. Cholecystectomy without biliary duct dilatation. 5. Osseous metastasis, including a more well-defined sclerotic lesion within the L1 vertebral body. This could represent progression or healing of disease. Electronically Signed   By: Abigail Miyamoto M.D.   On: 08/10/2020 14:43    MRI/MRCP 1. Signs of acute interstitial pancreatitis without focal fluid collection but with ascites and edema as described. 2. Moderate to marked hepatic steatosis with a background of innumerable metastatic foci of variable size. No biliary duct dilation. Extensive tumor in the  liver may explain elevated bilirubin. Consider MR follow-up for tracking of liver metastases given that many of these are quite small and PET scan and CT, particularly in the setting of steatosis with respect to CT assessment, may underestimate the degree of metastatic involvement. 3. Signs of bony metastatic disease. 4. Above findings appear to have worsened since more remote imaging from November. 5. Signs of potential mild colitis, correlate with any symptoms that would suggest portal colopathy in the setting of diffuse hepatic metastases. 6. Enlarged portacaval lymph node.  Assessment/Plan: Painless jaundice with history of metastatic breast cancer to liver, bone Pending today's labs No biliary obstruction or dlatation on MRCP Acute pancreatitis with moderate steatosis and extensive tumors likely explain the elevated bilirubin.  Being followed by Dr. 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     Vladimir Crofts PA-C 08/12/2020, 8:18 AM  Contact #  [phone]

## 2020-08-12 NOTE — Discharge Summary (Signed)
Physician Discharge Summary  Bethany Hanna IEP:[id number] DOB: [birth year] DOA: 08/10/2020  PCP: [name], MD  Admit date: 08/10/2020 Discharge date: 08/12/2020  Admitted From: Home  Disposition: Home  Recommendations for Outpatient Follow-up:  Follow up with PCP in 1-2 weeks Please obtain BMP/CBC in one week Please follow up with Dr. Lindi Adie  Home Health: None Equipment/Devices: None  Discharge Condition: Stable CODE STATUS: Full code Diet recommendation: Cardiac  Brief/Interim Summary:40 y.o. 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   Patient reports that about a week ago she felt dizzy at her office and noted her blood pressure to be elevated up to 660 systolic.  She then was seen by an NP in her primary office and started on Norvasc.  However she "did not feel right" after taking it. Then about 4 days ago she begin to note yellowing of her eyes. No worsening abdominal pain but she has a reducible umbilical hernia that hurts at times especially after lifting something heavy. No nausea, vomiting or diarrhea. No fever. No changes in appetite. Still able to eat a steak dinner the other night. Has drank more alcohol recently up to 2-3 glasses of wine due to [employer].   She saw her primary oncology Dr. Lindi Adie on the day of admission and had elevated LFTs and a total bilirubin of 14.5.  Indirect bilirubin of 8.9.     CT abdomen pelvis without contrast shows hepatic steatosis and hepatomegaly.  Exam is limited without IV contrast but shows likely progression of hepatic metastasis compared to prior scan in March.  Cholecystectomy without biliary ductal dilatation.   CBC shows Leukopenia of 3.9, thrombocytopenia 136.   Electrolytes otherwise normal   Discharge Diagnoses:  Principal Problem:   Hyperbilirubinemia Active Problems:   Malignant neoplasm of upper-outer quadrant of left breast in female, estrogen receptor positive (HCC)   Elevated blood pressure reading   Tachycardia   Leukopenia   Thrombocytopenia (HCC)   Transaminitis  Hyperbilirubinemia/transaminitis-patient with history of metastatic breast cancer with mets to the liver and bone.  No biliary obstruction or dilatation seen on MRCP.  MRI/MRCP shows acute pancreatitis with hepatic steatosis and extensive tumors in the liver. -She was seen by Dr. Lindi Adie today and plans to start her on Entrectinib 600 mg daily -She was very tearful after new MRI findings she has a supportive family.  She has a husband and 2 children and she was happy to go home.  I have given her prescriptions for oxycodone and lorazepam.  Leukopenia -Chronic and stable   Thrombocytopenia - Most likely secondary to worsening hepatic disease      Estimated body mass index is 32.41 kg/m as calculated from the following:   Height as of this encounter: 5\' 8"  (1.727 m).   Weight as of this encounter: 96.7 kg.  Discharge Instructions  Discharge Instructions     Diet - low sodium heart healthy   Complete by: As directed    Diet - low sodium heart healthy   Complete by: As directed    Increase activity slowly   Complete by: As directed    Increase activity slowly   Complete by: As directed       Allergies as of 08/12/2020   No Known Allergies      Medication  List     STOP taking these medications    ALPRAZolam 0.5 MG tablet Commonly known as: XANAX   Biotin 10 MG Caps   calcium carbonate 500 MG chewable tablet Commonly known as: TUMS - dosed in mg elemental calcium   polyvinyl alcohol 1.4 % ophthalmic solution Commonly known as: LIQUIFILM TEARS   Womens Multi Vitamin & Mineral Tabs       TAKE these medications    entrectinib 200 MG  capsule Commonly known as: ROZLYTREK Take 3 capsules (600 mg total) by mouth daily. Swallow capsules whole. Do not open, crush, chew, or dissolve the contents.   ibuprofen 200 MG tablet Commonly known as: ADVIL Take 400-600 mg by mouth every 6 (six) hours as needed for fever, headache or mild pain.   LORazepam 1 MG tablet Commonly known as: ATIVAN Take 1 tablet (1 mg total) by mouth every 8 (eight) hours as needed for anxiety (or nausea).   oxyCODONE 5 MG immediate release tablet Commonly known as: Roxicodone Take 1 tablet (5 mg total) by mouth every 4 (four) hours as needed.   traZODone 50 MG tablet Commonly known as: DESYREL Take 0.5-1 tablets (25-50 mg total) by mouth at bedtime as needed for sleep.        No Known Allergies  Consultations: onc   Procedures/Studies: CT Abdomen Pelvis Wo Contrast  Result Date: 08/10/2020 CLINICAL DATA:  Breast cancer. Staging. Jaundice for 2 days. Breast cancer 2 years ago. EXAM: CT ABDOMEN AND PELVIS WITHOUT CONTRAST TECHNIQUE: Multidetector CT imaging of the abdomen and pelvis was performed following the standard protocol without IV contrast. COMPARISON:  05/21/2020 FINDINGS: Lower chest: Lingular scarring. Normal heart size without pericardial or pleural effusion. Hepatobiliary: Marked hepatic steatosis and moderate hepatomegaly at 19.2 cm craniocaudal. Given lack of IV contrast and the extent of steatosis, evaluation for liver metastasis is significantly limited. Heterogeneity throughout the liver suggests progressive metastatic disease compared to 05/21/2020. Example area of hypoattenuation within the posterior right hepatic lobe at 12 mm on 29/2. Cholecystectomy, without biliary ductal dilatation. Pancreas: Normal, without mass or ductal dilatation. Spleen: Normal in size, without focal abnormality. Adrenals/Urinary Tract: Normal adrenal glands. No renal calculi or hydronephrosis. No hydroureter or ureteric calculi. No bladder calculi.  Stomach/Bowel: Portions of the stomach are underdistended. Submucosal fat prominence within the ascending colon is nonspecific. Normal terminal ileum and appendix. Normal small bowel. Vascular/Lymphatic: Normal caliber of the aorta and branch vessels. No abdominopelvic adenopathy. Reproductive: Normal uterus and adnexa. Other: Development of small volume abdominopelvic ascites. No evidence of omental or peritoneal disease. No free intraperitoneal air. Musculoskeletal: Widespread sclerotic osseous metastasis again identified. Example in the left pubic bone at 1.9 cm, similar. Sclerosis involving the inferior portion of the L1 vertebral body is felt to be increased, including at 2.3 cm on sagittal image 80. IMPRESSION: 1. Hepatic steatosis and hepatomegaly. Although evaluation for liver lesions is limited secondary to lack of IV contrast and underlying steatosis, the extent of heterogeneity suggests progressive hepatic metastasis compared to 05/21/2020. Pre and post contrast abdominal MRI or PET could confirm. 2. No extrahepatic metastatic disease in the abdomen or pelvis. 3. New small volume abdominopelvic ascites, also suggesting progressive liver disease. 4. Cholecystectomy without biliary duct dilatation. 5. Osseous metastasis, including a more well-defined sclerotic lesion within the L1 vertebral body. This could represent progression or healing of disease. Electronically Signed   By: Abigail Miyamoto M.D.   On: 08/10/2020 14:43   MR 3D Recon At Scanner  Result Date:  08/12/2020 CLINICAL DATA:  Painless jaundice with elevated liver function tests in a 40 year old female. EXAM: MRI ABDOMEN WITH CONTRAST (WITH MRCP) TECHNIQUE: Multiplanar multisequence MR imaging of the abdomen was performed following the administration of intravenous contrast. Heavily T2-weighted images of the biliary and pancreatic ducts were obtained, and three-dimensional MRCP images were rendered by post processing. CONTRAST:  81mL GADAVIST  GADOBUTROL 1 MMOL/ML IV SOLN COMPARISON:  None. FINDINGS: Lower chest: Incidental imaging of the lung bases on abdominal MRI without sign of consolidation or evidence of pleural effusion. Limited assessment on abdominal MRI. Hepatobiliary: Moderate to marked hepatic steatosis with a background of innumerable metastatic foci of variable size. Largest in the posterior RIGHT hepatic lobe measuring 3.2 x five) innumerable foci of smaller areas that show increased T2 signal and enhancement with irregular peripheral enhancement throughout the visual cm. (Image 9/15 and 10/15) Many of these lesion showed peripheral enhancement with target type features which are classic for metastases. There is evidence of varying degrees of capsular retraction overlying many of these foci. Essentially innumerable lesions are present portal vein is patent. Biliary tree is nondilated. Gallbladder is surgically absent. No intrahepatic biliary duct distension. In fact intrahepatic biliary tree not well visualized. Pancreas: Signs of acute interstitial edematous pancreatitis without focal fluid collection. The gland continues to enhance uniformly and has relative preservation of intrinsic T1 signal throughout. No signs of ductal dilation. Spleen:  Spleen normal size and contour without focal lesion. Adrenals/Urinary Tract: Adrenal glands are normal. Kidneys enhance symmetrically without focal abnormality. No hydronephrosis. Stomach/Bowel: Bowel edema about the duodenum in the setting of acute interstitial pancreatitis. Thickening of the ascending colon and transverse colon. Bowel with limited assessment on abdominal MRI. Much of the colonic thickening appears to represent submucosal fat. Though there is also likely baseline colonic edema Vascular/Lymphatic: Vascular structures in the abdomen are patent without aneurysm of the abdominal aorta up. Enlarged portacaval lymph node (image 51/32) 15 mm short axis. No retroperitoneal adenopathy.  Other: Ascites and edema in the anterior pararenal space. Ascites is small volume. Early flank edema bilaterally. Small ascites in a fat containing umbilical hernia. Musculoskeletal: Diffuse bony metastatic disease. Largest lesion showing sclerosis based on T1 and T2 signal in the L1 vertebral body (image 23/6) innumerable foci of enhancement with irregular margins throughout the remainder of the visible spine. IMPRESSION: 1. Signs of acute interstitial pancreatitis without focal fluid collection but with ascites and edema as described. 2. Moderate to marked hepatic steatosis with a background of innumerable metastatic foci of variable size. No biliary duct dilation. Extensive tumor in the liver may explain elevated bilirubin. Consider MR follow-up for tracking of liver metastases given that many of these are quite small and PET scan and CT, particularly in the setting of steatosis with respect to CT assessment, may underestimate the degree of metastatic involvement. 3. Signs of bony metastatic disease. 4. Above findings appear to have worsened since more remote imaging from November. 5. Signs of potential mild colitis, correlate with any symptoms that would suggest portal colopathy in the setting of diffuse hepatic metastases. 6. Enlarged portacaval lymph node. These results will be called to the ordering clinician or representative by the Radiologist Assistant, and communication documented in the PACS or [employer]. Electronically Signed   By: Zetta Bills M.D.   On: 08/12/2020 08:16   MR ABDOMEN WITH MRCP W CONTRAST  Result Date: 08/12/2020 CLINICAL DATA:  Painless jaundice with elevated liver function tests in a 40 year old female. EXAM: MRI ABDOMEN WITH CONTRAST (  WITH MRCP) TECHNIQUE: Multiplanar multisequence MR imaging of the abdomen was performed following the administration of intravenous contrast. Heavily T2-weighted images of the biliary and pancreatic ducts were obtained, and three-dimensional  MRCP images were rendered by post processing. CONTRAST:  86mL GADAVIST GADOBUTROL 1 MMOL/ML IV SOLN COMPARISON:  None. FINDINGS: Lower chest: Incidental imaging of the lung bases on abdominal MRI without sign of consolidation or evidence of pleural effusion. Limited assessment on abdominal MRI. Hepatobiliary: Moderate to marked hepatic steatosis with a background of innumerable metastatic foci of variable size. Largest in the posterior RIGHT hepatic lobe measuring 3.2 x five) innumerable foci of smaller areas that show increased T2 signal and enhancement with irregular peripheral enhancement throughout the visual cm. (Image 9/15 and 10/15) Many of these lesion showed peripheral enhancement with target type features which are classic for metastases. There is evidence of varying degrees of capsular retraction overlying many of these foci. Essentially innumerable lesions are present portal vein is patent. Biliary tree is nondilated. Gallbladder is surgically absent. No intrahepatic biliary duct distension. In fact intrahepatic biliary tree not well visualized. Pancreas: Signs of acute interstitial edematous pancreatitis without focal fluid collection. The gland continues to enhance uniformly and has relative preservation of intrinsic T1 signal throughout. No signs of ductal dilation. Spleen:  Spleen normal size and contour without focal lesion. Adrenals/Urinary Tract: Adrenal glands are normal. Kidneys enhance symmetrically without focal abnormality. No hydronephrosis. Stomach/Bowel: Bowel edema about the duodenum in the setting of acute interstitial pancreatitis. Thickening of the ascending colon and transverse colon. Bowel with limited assessment on abdominal MRI. Much of the colonic thickening appears to represent submucosal fat. Though there is also likely baseline colonic edema Vascular/Lymphatic: Vascular structures in the abdomen are patent without aneurysm of the abdominal aorta up. Enlarged portacaval lymph  node (image 51/32) 15 mm short axis. No retroperitoneal adenopathy. Other: Ascites and edema in the anterior pararenal space. Ascites is small volume. Early flank edema bilaterally. Small ascites in a fat containing umbilical hernia. Musculoskeletal: Diffuse bony metastatic disease. Largest lesion showing sclerosis based on T1 and T2 signal in the L1 vertebral body (image 23/6) innumerable foci of enhancement with irregular margins throughout the remainder of the visible spine. IMPRESSION: 1. Signs of acute interstitial pancreatitis without focal fluid collection but with ascites and edema as described. 2. Moderate to marked hepatic steatosis with a background of innumerable metastatic foci of variable size. No biliary duct dilation. Extensive tumor in the liver may explain elevated bilirubin. Consider MR follow-up for tracking of liver metastases given that many of these are quite small and PET scan and CT, particularly in the setting of steatosis with respect to CT assessment, may underestimate the degree of metastatic involvement. 3. Signs of bony metastatic disease. 4. Above findings appear to have worsened since more remote imaging from November. 5. Signs of potential mild colitis, correlate with any symptoms that would suggest portal colopathy in the setting of diffuse hepatic metastases. 6. Enlarged portacaval lymph node. These results will be called to the ordering clinician or representative by the Radiologist Assistant, and communication documented in the PACS or [employer]. Electronically Signed   By: Zetta Bills M.D.   On: 08/12/2020 08:16   (Echo, Carotid, EGD, Colonoscopy, ERCP)    Subjective:  Husband by the bedside.  Patient tearful has been tearful Discharge Exam: Vitals:   08/11/20 2042 08/12/20 0528  BP: 132/87 119/76  Pulse: (!) 107 97  Resp: 16 16  Temp: 98.5 F (36.9 C) 98.4  F (36.9 C)  SpO2: 97% 97%   Vitals:   08/11/20 2042 08/12/20 0528 08/12/20 0857 08/12/20  1151  BP: 132/87 119/76    Pulse: (!) 107 97    Resp: 16 16    Temp: 98.5 F (36.9 C) 98.4 F (36.9 C)    TempSrc: Oral Oral    SpO2: 97% 97%    Weight:   96.7 kg   Height:    5\' 8"  (1.727 m)    General: Pt is alert, awake, not in acute distress Cardiovascular: RRR, S1/S2 +, no rubs, no gallops Respiratory: CTA bilaterally, no wheezing, no rhonchi Abdominal: Soft, NT, ND, bowel sounds + Extremities: no edema, no cyanosis    The results of significant diagnostics from this hospitalization (including imaging, microbiology, ancillary and laboratory) are listed below for reference.     Microbiology: Recent Results (from the past 240 hour(s))  SARS CORONAVIRUS 2 (TAT 6-24 HRS) Nasopharyngeal Nasopharyngeal Swab     Status: None   Collection Time: 08/10/20  8:16 PM   Specimen: Nasopharyngeal Swab  Result Value Ref Range Status   SARS Coronavirus 2 NEGATIVE NEGATIVE Final    Comment: (NOTE) SARS-CoV-2 target nucleic acids are NOT DETECTED.  The SARS-CoV-2 RNA is generally detectable in upper and lower respiratory specimens during the acute phase of infection. Negative results do not preclude SARS-CoV-2 infection, do not rule out co-infections with other pathogens, and should not be used as the sole basis for treatment or other patient management decisions. Negative results must be combined with clinical observations, patient history, and epidemiological information. The expected result is Negative.  Fact Sheet for Patients: SugarRoll.be  Fact Sheet for Healthcare Providers: https://www.woods-.com/  This test is not yet approved or cleared by the [place] FDA and  has been authorized for detection and/or diagnosis of SARS-CoV-2 by FDA under an Emergency Use Authorization (EUA). This EUA will remain  in effect (meaning this test can be used) for the duration of the COVID-19 declaration under Se ction 564(b)(1) of the Act,  21 U.S.C. section 360bbb-3(b)(1), unless the authorization is terminated or revoked sooner.  Performed at [facility], [place] [address] [place], [place] [address]      Labs: BNP (last 3 results) No results for input(s): BNP in the last 8760 hours. Basic Metabolic Panel: Recent Labs  Lab 08/10/20 0936 08/11/20 0555 08/12/20 0819  NA 133* 135 137  K 3.9 3.4* 3.3*  CL 99 102 105  CO2 22 26 24   GLUCOSE 88 99 87  BUN 11 12 8   CREATININE 0.77 0.78 0.53  CALCIUM 8.7* 8.2* 7.8*   Liver Function Tests: Recent Labs  Lab 08/10/20 0936 08/11/20 0555 08/12/20 0819  AST 507* 453* 426*  ALT 194* 162* 154*  ALKPHOS 370* 312* 309*  BILITOT 14.5* 13.6* 14.4*  PROT 6.6 6.0* 6.1*  ALBUMIN 2.6* 2.6* 2.4*   No results for input(s): LIPASE, AMYLASE in the last 168 hours. No results for input(s): AMMONIA in the last 168 hours. CBC: Recent Labs  Lab 08/10/20 0936 08/11/20 0555 08/12/20 0819  WBC 3.9* 3.2* 3.1*  NEUTROABS 3.2  --   --   HGB 12.8 11.3* 11.8*  HCT 35.5* 33.2* 34.3*  MCV 108.9* 115.7* 114.3*  PLT 136* 126* 130*   Cardiac Enzymes: No results for input(s): CKTOTAL, CKMB, CKMBINDEX, TROPONINI in the last 168 hours. BNP: Invalid input(s): POCBNP CBG: No results for input(s): GLUCAP in the last 168 hours. D-Dimer No results for input(s): DDIMER  in the last 72 hours. Hgb A1c No results for input(s): HGBA1C in the last 72 hours. Lipid Profile No results for input(s): CHOL, HDL, LDLCALC, TRIG, CHOLHDL, LDLDIRECT in the last 72 hours. Thyroid function studies No results for input(s): TSH, T4TOTAL, T3FREE, THYROIDAB in the last 72 hours.  Invalid input(s): FREET3 Anemia work up No results for input(s): VITAMINB12, FOLATE, FERRITIN, TIBC, IRON, RETICCTPCT in the last 72 hours. Urinalysis    Component Value Date/Time   COLORURINE YELLOW 04/09/2015 0004   APPEARANCEUR CLEAR 04/09/2015 0004   LABSPEC 1.009 04/09/2015 0004   PHURINE 7.5 04/09/2015 0004    GLUCOSEU NEGATIVE 04/09/2015 0004   HGBUR NEGATIVE 04/09/2015 0004   BILIRUBINUR NEGATIVE 04/09/2015 0004   BILIRUBINUR neg 01/19/2012 1834   KETONESUR NEGATIVE 04/09/2015 0004   PROTEINUR NEGATIVE 04/09/2015 0004   UROBILINOGEN 0.2 01/19/2012 1834   UROBILINOGEN 0.2 09/18/2011 1757   NITRITE NEGATIVE 04/09/2015 0004   LEUKOCYTESUR NEGATIVE 04/09/2015 0004   Sepsis Labs Invalid input(s): PROCALCITONIN,  WBC,  LACTICIDVEN Microbiology Recent Results (from the past 240 hour(s))  SARS CORONAVIRUS 2 (TAT 6-24 HRS) Nasopharyngeal Nasopharyngeal Swab     Status: None   Collection Time: 08/10/20  8:16 PM   Specimen: Nasopharyngeal Swab  Result Value Ref Range Status   SARS Coronavirus 2 NEGATIVE NEGATIVE Final    Comment: (NOTE) SARS-CoV-2 target nucleic acids are NOT DETECTED.  The SARS-CoV-2 RNA is generally detectable in upper and lower respiratory specimens during the acute phase of infection. Negative results do not preclude SARS-CoV-2 infection, do not rule out co-infections with other pathogens, and should not be used as the sole basis for treatment or other patient management decisions. Negative results must be combined with clinical observations, patient history, and epidemiological information. The expected result is Negative.  Fact Sheet for Patients: SugarRoll.be  Fact Sheet for Healthcare Providers: https://www.woods[employer].com/  This test is not yet approved or cleared by the [place] FDA and  has been authorized for detection and/or diagnosis of SARS-CoV-2 by FDA under an Emergency Use Authorization (EUA). This EUA will remain  in effect (meaning this test can be used) for the duration of the COVID-19 declaration under Se ction 564(b)(1) of the Act, 21 U.S.C. section 360bbb-3(b)(1), unless the authorization is terminated or revoked sooner.  Performed at [facility], [place] [address] [place], [place] [address]       Time coordinating discharge: 39 minutes  SIGNED:   Georgette Shell, MD  [facility] 08/12/2020, 4:25 PM

## 2020-08-12 NOTE — Progress Notes (Signed)
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

## 2020-08-13 ENCOUNTER — Encounter: Payer: Self-pay | Admitting: *Deleted

## 2020-08-13 ENCOUNTER — Other Ambulatory Visit (HOSPITAL_COMMUNITY): Payer: Self-pay

## 2020-08-13 ENCOUNTER — Other Ambulatory Visit: Payer: Self-pay | Admitting: *Deleted

## 2020-08-13 DIAGNOSIS — R17 Unspecified jaundice: Secondary | ICD-10-CM

## 2020-08-13 DIAGNOSIS — Z170 Estrogen receptor positive status [ER+]: Secondary | ICD-10-CM

## 2020-08-13 DIAGNOSIS — C50412 Malignant neoplasm of upper-outer quadrant of left female breast: Secondary | ICD-10-CM

## 2020-08-13 NOTE — Telephone Encounter (Signed)
Oral Oncology Pharmacist Encounter   Prior Authorization for Rozlytrek (entrectinib) has been denied due to patient having moderate (total bilirubin greater than 1.5 - 3x ULN) or severe (total bilirubin greater than 3x ULN) hepatic impairment at time of PA request.  Denial letter also notes that insurance will require that patient have EKG, LFT, electrolyte, serum uric acid and negative pregnancy test prior to initiation of treatment of Rozlytrek.    Will proceed with appeal process at this time.     Oral Oncology Clinic will continue to follow.    Leron Croak, PharmD, BCPS Hematology/Oncology Clinical Pharmacist [facility] [phone] 08/13/2020 7:41 AM

## 2020-08-13 NOTE — Progress Notes (Signed)
Per MD pt needing urine pregnancy test, EKG, LFT's, serum uric acid, and electrolyte panel added for 08/16/20 apt.  Orders placed.

## 2020-08-14 NOTE — Progress Notes (Signed)
Patient Care Team: Bethany Olp, MD as PCP - General (Family Medicine) Bethany Lose, MD as Consulting Physician (Hematology and Oncology) Bethany Pray, MD as Consulting Physician (Radiation Oncology) Bethany Bookbinder, MD as Consulting Physician (General Surgery)  DIAGNOSIS:    ICD-10-CM   1. Malignant neoplasm of upper-outer quadrant of left breast in female, estrogen receptor positive ([facility]any Hanna)  C50.412 CBC with Differential (Bethany Hanna)   Z17.0 Bethany Hanna (Bethany Hanna only)    CBC with Differential    Comprehensive metabolic panel    2. Metastatic breast cancer (Bethany Hanna)  C50.919 CBC with Differential (Bethany Hanna)    CMP (Bethany Hanna only)    CBC with Differential    Comprehensive metabolic panel      SUMMARY OF ONCOLOGIC HISTORY: Oncology History  Malignant neoplasm of upper-outer quadrant of left breast in female, estrogen receptor positive (Bethany Hanna)  05/13/2018 Initial Diagnosis   Evaluation of 5 months of thickening of the left breast upper outer quadrant.  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  Several lymph nodes were identified by ultrasound.  T2 N1 stage IIb clinical stage    05/16/2018 Cancer Staging   Staging form: Breast, AJCC 8th Edition - Clinical stage from 05/16/2018: Stage IIB (cT2, cN1, cM0, G3, ER+, PR+, HER2-) - Signed by Bethany Lose, MD on 05/16/2018    05/28/2018 - 10/08/2018 Chemotherapy   DOXOrubicin (ADRIAMYCIN) chemo injection 128 mg, 60 mg/m2 = 128 mg, Intravenous,  Once, 4 of 4 cycles. Administration: 128 mg (05/28/2018), 128 mg (06/11/2018), 128 mg (06/25/2018), 128 mg (07/09/2018)  palonosetron (ALOXI) injection 0.25 mg, 0.25 mg, Intravenous,  Once, 9 of 9 cycles. Administration: 0.25 mg  (05/28/2018), 0.25 mg (06/11/2018), 0.25 mg (06/25/2018), 0.25 mg (07/09/2018), 0.25 mg (09/10/2018), 0.25 mg (09/17/2018), 0.25 mg (09/24/2018), 0.25 mg (10/01/2018), 0.25 mg (10/08/2018)  pegfilgrastim (NEULASTA ONPRO KIT) injection 6 mg, 6 mg, Subcutaneous, Once, 4 of 4 cycles. Administration: 6 mg (05/28/2018), 6 mg (06/11/2018), 6 mg (06/25/2018), 6 mg (07/09/2018)  cyclophosphamide (CYTOXAN) 1,280 mg in sodium chloride 0.9 % 250 mL chemo infusion, 600 mg/m2 = 1,280 mg, Intravenous,  Once, 4 of 4 cycles. Administration: 1,280 mg (05/28/2018), 1,280 mg (06/11/2018), 1,280 mg (06/25/2018), 1,280 mg (07/09/2018)  PACLitaxel (TAXOL) 168 mg in sodium chloride 0.9 % 250 mL chemo infusion (</= 57m/m2), 80 mg/m2 = 168 mg, Intravenous,  Once, 12 of 12 cycles. Administration: 168 mg (07/23/2018), 168 mg (07/30/2018), 168 mg (08/06/2018), 168 mg (08/13/2018), 168 mg (08/20/2018), 168 mg (08/27/2018), 168 mg (09/03/2018), 168 mg (09/10/2018), 168 mg (09/17/2018), 168 mg (09/24/2018), 168 mg (10/01/2018), 168 mg (10/08/2018)  fosaprepitant (EMEND) 150 mg, dexamethasone (DECADRON) 12 mg in sodium chloride 0.9 % 145 mL IVPB, , Intravenous,  Once, 4 of 4 cycles. Administration:  (05/28/2018),  (06/11/2018),  (06/25/2018),  (07/09/2018)    08/16/2018 Genetic Testing   Negative genetic testing on the multi-cancer panel.  The Multi-Gene Panel offered by [facility] includes sequencing and/or deletion duplication testing of the following 85 genes: AIP, ALK, APC, ATM, AXIN2,BAP1,  BARD1, BLM, BMPR1A, BRCA1, BRCA2, BRIP1, CASR, CDC73, CDH1, CDK4, CDKN1B, CDKN1C, CDKN2A (p14ARF), CDKN2A (p16INK4a), CEBPA, CHEK2, CTNNA1, DICER1, DIS3L2, EGFR (c.2369C>T, p.Thr790Met variant only), EPCAM (Deletion/duplication testing only), FH, FLCN, GATA2, GPC3, GREM1 (Promoter region deletion/duplication testing only), HOXB13 (c.251G>A, p.Gly84Glu), HRAS, KIT,  MAX, MEN1, MET, MITF (c.952G>A, p.Glu318Lys variant only), MLH1, MSH2, MSH3, MSH6, MUTYH, NBN, NF1, NF2, NTHL1, PALB2, PDGFRA,  PHOX2B, PMS2, POLD1, POLE, POT1, PRKAR1A, PTCH1, PTEN, RAD50, RAD51C, RAD51D, RB1, RECQL4, RET, RNF43, RUNX1, SDHAF2, SDHA (sequence changes only), SDHB, SDHC, SDHD, SMAD4, SMARCA4, SMARCB1, SMARCE1, STK11, SUFU, TERC, TERT, TMEM127, TP53, TSC1, TSC2, VHL, WRN and WT1.  The report date is August 16, 2018.   10/07/2018 Breast MRI   Significant improvement of left breast malignancy.  Non-mass enhancement 3.7 cm.  Significant improvement in the left axillary lymph nodes previously there were 6 nodes and currently there was a single lymph node with mild cortical thickening.   11/05/2018 Surgery   Left lumpectomy Donne Hanna) [phone]): residual invasive carcinoma s/p neoadjuvant treatment, HER-2 - (0), ER+ 100%, PR+ 60%, Ki67 10%, involved inferior and medial margins, and 8/9 left axillary lymph nodes positive for carcinoma.   11/05/2018 Cancer Staging   Staging form: Breast, AJCC 8th Edition - Pathologic stage from 11/05/2018: No Stage Recommended (ypT2, pN2a, cM0, ER+, PR+, HER2-) - Signed by Bethany Phlegm, Bethany Hanna on 11/27/2018    11/18/2018 Surgery   Re-excision of left lumpectomy Donne Hanna) [phone]): residual invasive carcinoma, clear margins.   12/30/2018 - 02/14/2019 Radiation Therapy   The patient initially received a dose of 50.4 Gy in 28 fractions to the breast using whole-breast tangent fields. This was delivered using a 3-D conformal technique. The patient also received a dose of 50.4 Gy in 28 fraction to the left axilla. The pt received a boost delivering an additional 12 Gy in 6 fractions using a electron boost with 73meV electrons. The total dose was 112.8 Gy.    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, ER/PR positive, with benign fallopian tubes, intraoperative findings showed liver with nodules suggestive of metastatic disease   02/2019 -  Anti-estrogen oral therapy   Anastrozole with Ibrance   08/19/2020  -  Chemotherapy    Patient is on Treatment Plan: BLADDER GEMCITABINE D1,8,15 Q28D       Metastatic breast cancer ([place])  08/16/2020 Initial Diagnosis   Metastatic breast cancer ([place])    08/19/2020 -  Chemotherapy    Patient is on Treatment Plan: BLADDER GEMCITABINE D1,8,15 Q28D         CHIEF COMPLIANT: Follow-up of metastatic breast cancer, recent hospitalization  INTERVAL HISTORY: Bethany Hanna is a 40 y.o. with above-mentioned history of metastatic breast cancer currently on treatment with Ibrance, Faslodex, and letrozole. She presented to the clinic on 08/10/20 with severe jaundice and letrozole and Ibrance were held and she was admitted to [facility]. CT abdomen/pelvis showed hepatic steatosis and hepatomegaly suggesting progression of hepatic metastases. She was started on Entrectinib 600 mg daily and discharged on 08/12/20. She presents to the clinic today for follow-up and to discuss further treatment.  Since the time of discharge she is gotten much worse.  Energy levels have declined and she is using a wheelchair.  She apparently sleeps most of the daytime.  She is getting more jaundiced.  Her mother notes that she is got more nauseated.  ALLERGIES:  has No Known Allergies.  MEDICATIONS:  Current Outpatient Medications  Medication Sig Dispense Refill   ibuprofen (ADVIL) 200 MG tablet Take 400-600 mg by mouth every 6 (six) hours as needed for fever, headache or mild pain.     LORazepam (ATIVAN) 1 MG tablet Take 1 tablet (1 mg total) by mouth every 8 (eight) hours as needed for  anxiety (or nausea). 60 tablet 0   ondansetron (ZOFRAN) 8 MG tablet Take 1 tablet (8 mg total) by mouth every 8 (eight) hours as needed for nausea or vomiting. 20 tablet 0   oxyCODONE (ROXICODONE) 5 MG immediate release tablet Take 1 tablet (5 mg total) by mouth every 4 (four) hours as needed. 30 tablet 0   traZODone (DESYREL) 50 MG tablet Take 0.5-1 tablets (25-50 mg total) by mouth at bedtime as needed  for sleep. 30 tablet 3   No current facility-administered medications for this visit.    PHYSICAL EXAMINATION: ECOG PERFORMANCE STATUS: 3 - Symptomatic, >50% confined to bed  Vitals:   08/16/20 0927  BP: (!) 133/95  Pulse: (!) 127  Resp: 18  Temp: 97.7 F (36.5 C)  SpO2: 100%   Filed Weights   08/16/20 0927  Weight: 225 lb 11.2 oz (102.4 kg)     LABORATORY DATA:  I have reviewed the data as listed CMP Latest Ref Rng & Units 08/16/2020 08/12/2020 08/11/2020  Glucose 70 - 99 mg/dL 97 87 99  BUN 6 - 20 mg/dL 8 8 12   Creatinine 0.44 - 1.00 mg/dL 0.73 0.53 0.78  Sodium 135 - 145 mmol/L 137 137 135  Potassium 3.5 - 5.1 mmol/L 3.9 3.3(L) 3.4(L)  Chloride 98 - 111 mmol/L 101 105 102  CO2 22 - 32 mmol/L 20(L) 24 26  Calcium 8.9 - 10.3 mg/dL 8.8(L) 7.8(L) 8.2(L)  Total Protein 6.5 - 8.1 g/dL 6.1(L) 6.1(L) 6.0(L)  Total Bilirubin 0.3 - 1.2 mg/dL 21.8(HH) 14.4(H) 13.6(H)  Alkaline Phos 38 - 126 U/L 392(H) 309(H) 312(H)  AST 15 - 41 U/L 483(HH) 426(H) 453(H)  ALT 0 - 44 U/L 163(H) 154(H) 162(H)    Lab Results  Component Value Date   WBC 4.8 08/16/2020   HGB 13.3 08/16/2020   HCT 37.5 08/16/2020   MCV 109.6 (H) 08/16/2020   PLT 151 08/16/2020   NEUTROABS 3.9 08/16/2020    ASSESSMENT & PLAN:  Malignant neoplasm of upper-outer quadrant of left breast in female, estrogen receptor positive ([place]) /11/2018:Evaluation of 5 months of thickening of the left breast upper outer quadrant.  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  Several lymph nodes were identified by ultrasound.  T2 N1 stage IIb clinical stage   Treatment plan: 1.  Neoadjuvant chemotherapy with dose dense Adriamycin and Cytoxan followed by Taxol weekly x12 completed  10/08/2018 2. 11/05/2018:Left lumpectomy Donne Hanna): residual invasive carcinoma s/p neoadjuvant treatment, HER-2 - (0), ER+ 100%, PR+ 60%, Ki67 10%, involved inferior and medial margins, and 8/9 left axillary lymph nodes positive for carcinoma. Resection of margins 11/18/2018: Residual cancer was identified in the left inferior margin and the final margins are clear. 3.  Adjuvant radiation therapy 12/30/2018-02/10/2019 4. 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 ------------------------------------------------------------------------------------------------------------------------------------------------------  Current Treatment: Ibrance along with letrozole started 02/24/19    Ibrance toxicities: Denies any adverse effects to [place]. 1. Leukopenia: ANC 2.2 so we will continue the dosage at 125 mg. 2. Elevated AST and ALT: Most likely related to [place].  These results are normal today.   PET/CT 01/19/2020: Progressive findings with new metastatic lesions in the liver  and skeleton. Dominant lesion SUV 8.3 measuring 1.3 cm. Porta hepatis lymph node measuring 0.9 cm SUV 7.2, celiac lymph node 0.7 cm SUV 5.6, multiple new small bone lesions sacrum, right iliac bone, L2, L1, T3 vertebral body. Right pubic bone SUV 8.5    Treatment plan: 1.  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 added Faslodex 01/26/2020   CT chest abdomen pelvis 05/21/2020: Decreased size and number of liver metastases stable bone metastases.  One lesion in segment 7 of the liver may be new. 08/10/20: CT CAP 08/12/20 MRCP: interstital pancreatitis, ext tumors in liver, bone mets, mild colitis Hospitalization: Obstructive jaundice 08/10/20-08/12/20 (Intra hepatic obstruction due to malignancy.   Plan: Entrectinib 200 mg daily (NTRK fusion).   Unfortunately because of elevated LFTs she is not a candidate for that at this time. Current plan is to treat her with gemcitabine days 1 and 15 q. 28 days We will see her later this week to start her first cycle of treatment.  I discussed with the patient's mom and the patient as well as her husband by telephone that she has extremely poor prognosis and that the current treatments have been very low likelihood of response.  It is not unreasonable to consider hospice care. Patient wants to try chemotherapy and therefore we will request for chemo to be started later this week.    Orders Placed This Encounter  Procedures   CBC with Differential ([place] Only)    Standing Status:   Standing    Number of Occurrences:   20    Standing Expiration Date:   08/16/2021   CMP ([place] only)    Standing Status:   Standing    Number of Occurrences:   20    Standing Expiration Date:   08/16/2021   CBC with Differential    Standing Status:   Standing    Number of Occurrences:   20    Standing Expiration Date:   08/16/2021   Comprehensive metabolic panel    Standing Status:   Standing    Number of Occurrences:   20    Standing Expiration Date:   08/16/2021   EKG 12-Lead    Ordered by an unspecified provider    The patient has a good understanding of the overall plan. she agrees with it. she will call with any problems that may develop before the next visit here.  Total time spent: 45 mins including face to face time and time spent for planning, charting and coordination of care  Rulon Eisenmenger, MD, MPH 08/16/2020  I, Molly Dorshimer, am acting as scribe for Dr. Nicholas Hanna.  I have reviewed the above documentation for accuracy and completeness, and I agree with the above.

## 2020-08-15 NOTE — Assessment & Plan Note (Signed)
/  11/2018:Evaluation of 5 months of thickening of the left breast upper outer quadrant. 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 Several lymph nodes were identified by ultrasound. T2 N1 stage IIb clinical stage  Treatment plan: 1.Neoadjuvant chemotherapy with dose dense Adriamycin and Cytoxan followed by Taxol weekly x12completed 10/08/2018 2.11/05/2018:Left lumpectomy [name]: residual invasive carcinoma s/p neoadjuvant treatment, HER-2 - (0), ER+ 100%, PR+ 60%, Ki67 10%, involved inferior and medial margins, and 8/9 left axillary lymph nodes positive for carcinoma. Resection of margins 11/18/2018: Residual cancer was identified in the left inferior margin and the final margins are clear. 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 ------------------------------------------------------------------------------------------------------------------------------------------------------  Current Treatment:Ibrance along with letrozolestarted 02/24/19   Ibrance toxicities:Denies any adverse effects to [place]. 1.Leukopenia: ANC2.2so we will continue the dosage at 125 mg. 2.Elevated AST and ALT: Most likely related to [place].These results are normal today.  PET/CT 01/19/2020: Progressive findings with new metastatic lesions in the liver and skeleton. Dominant lesion SUV 8.3 measuring 1.3 cm. Porta hepatis lymph node measuring 0.9 cm SUV 7.2, celiac lymph node 0.7 cm SUV 5.6, multiple new small bone lesions  sacrum, right iliac bone, L2, L1, T3 vertebral body. 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  One lesion in segment 7 of the liver may be new. 08/10/20: CT CAP 08/12/20 MRCP: interstital pancreatitis, ext tumors in liver, bone mets, mild colitis Hospitalization: Obstructive jaundice 08/10/20-08/12/20 (Intra hepatic obstruction due to malignancy.  Plan: Entrectinib 200 mg daily (NTRK fusion)

## 2020-08-16 ENCOUNTER — Encounter: Payer: Self-pay | Admitting: *Deleted

## 2020-08-16 ENCOUNTER — Inpatient Hospital Stay: Payer: BC Managed Care – PPO

## 2020-08-16 ENCOUNTER — Other Ambulatory Visit: Payer: Self-pay

## 2020-08-16 ENCOUNTER — Other Ambulatory Visit: Payer: Self-pay | Admitting: Pharmacist

## 2020-08-16 ENCOUNTER — Other Ambulatory Visit: Payer: Self-pay | Admitting: *Deleted

## 2020-08-16 ENCOUNTER — Inpatient Hospital Stay (HOSPITAL_BASED_OUTPATIENT_CLINIC_OR_DEPARTMENT_OTHER): Payer: BC Managed Care – PPO | Admitting: Hematology and Oncology

## 2020-08-16 ENCOUNTER — Telehealth: Payer: Self-pay | Admitting: *Deleted

## 2020-08-16 VITALS — BP 133/95 | HR 127 | Temp 97.7°F | Resp 18 | Ht 68.0 in | Wt 225.7 lb

## 2020-08-16 DIAGNOSIS — C50412 Malignant neoplasm of upper-outer quadrant of left female breast: Secondary | ICD-10-CM | POA: Diagnosis not present

## 2020-08-16 DIAGNOSIS — C50919 Malignant neoplasm of unspecified site of unspecified female breast: Secondary | ICD-10-CM | POA: Insufficient documentation

## 2020-08-16 DIAGNOSIS — R17 Unspecified jaundice: Secondary | ICD-10-CM | POA: Diagnosis not present

## 2020-08-16 DIAGNOSIS — R7989 Other specified abnormal findings of blood chemistry: Secondary | ICD-10-CM | POA: Diagnosis not present

## 2020-08-16 DIAGNOSIS — C7951 Secondary malignant neoplasm of bone: Secondary | ICD-10-CM | POA: Diagnosis not present

## 2020-08-16 DIAGNOSIS — Z79899 Other long term (current) drug therapy: Secondary | ICD-10-CM | POA: Diagnosis not present

## 2020-08-16 DIAGNOSIS — Z5111 Encounter for antineoplastic chemotherapy: Secondary | ICD-10-CM | POA: Diagnosis present

## 2020-08-16 DIAGNOSIS — Z170 Estrogen receptor positive status [ER+]: Secondary | ICD-10-CM

## 2020-08-16 DIAGNOSIS — D72819 Decreased white blood cell count, unspecified: Secondary | ICD-10-CM | POA: Diagnosis not present

## 2020-08-16 DIAGNOSIS — C787 Secondary malignant neoplasm of liver and intrahepatic bile duct: Secondary | ICD-10-CM | POA: Diagnosis not present

## 2020-08-16 LAB — CMP (CANCER CENTER ONLY)
ALT: 163 U/L — ABNORMAL HIGH (ref 0–44)
AST: 483 U/L (ref 15–41)
Albumin: 2.3 g/dL — ABNORMAL LOW (ref 3.5–5.0)
Alkaline Phosphatase: 392 U/L — ABNORMAL HIGH (ref 38–126)
Anion gap: 16 — ABNORMAL HIGH (ref 5–15)
BUN: 8 mg/dL (ref 6–20)
CO2: 20 mmol/L — ABNORMAL LOW (ref 22–32)
Calcium: 8.8 mg/dL — ABNORMAL LOW (ref 8.9–10.3)
Chloride: 101 mmol/L (ref 98–111)
Creatinine: 0.73 mg/dL (ref 0.44–1.00)
GFR, Estimated: >60 mL/min (ref >60)
Glucose, Bld: 97 mg/dL (ref 70–99)
Potassium: 3.9 mmol/L (ref 3.5–5.1)
Sodium: 137 mmol/L (ref 135–145)
Total Bilirubin: 21.8 mg/dL (ref 0.3–1.2)
Total Protein: 6.1 g/dL — ABNORMAL LOW (ref 6.5–8.1)

## 2020-08-16 LAB — CBC WITH DIFFERENTIAL (CANCER CENTER ONLY)
Abs Immature Granulocytes: 0.04 10*3/uL (ref 0.00–0.07)
Basophils Absolute: 0 10*3/uL (ref 0.0–0.1)
Basophils Relative: 1 %
Eosinophils Absolute: 0 10*3/uL (ref 0.0–0.5)
Eosinophils Relative: 0 %
HCT: 37.5 % (ref 36.0–46.0)
Hemoglobin: 13.3 g/dL (ref 12.0–15.0)
Immature Granulocytes: 1 %
Lymphocytes Relative: 7 %
Lymphs Abs: 0.3 10*3/uL — ABNORMAL LOW (ref 0.7–4.0)
MCH: 38.9 pg — ABNORMAL HIGH (ref 26.0–34.0)
MCHC: 35.5 g/dL (ref 30.0–36.0)
MCV: 109.6 fL — ABNORMAL HIGH (ref 80.0–100.0)
Monocytes Absolute: 0.5 10*3/uL (ref 0.1–1.0)
Monocytes Relative: 10 %
Neutro Abs: 3.9 10*3/uL (ref 1.7–7.7)
Neutrophils Relative %: 81 %
Platelet Count: 151 10*3/uL (ref 150–400)
RBC: 3.42 MIL/uL — ABNORMAL LOW (ref 3.87–5.11)
RDW: 18.1 % — ABNORMAL HIGH (ref 11.5–15.5)
WBC Count: 4.8 10*3/uL (ref 4.0–10.5)
nRBC: 0 % (ref 0.0–0.2)

## 2020-08-16 LAB — MAGNESIUM: Magnesium: 1.9 mg/dL (ref 1.7–2.4)

## 2020-08-16 LAB — URIC ACID: Uric Acid, Serum: 6.9 mg/dL (ref 2.5–7.1)

## 2020-08-16 MED ORDER — ONDANSETRON HCL 8 MG PO TABS: 8.0000 mg | ORAL_TABLET | Freq: Three times a day (TID) | ORAL | 0 refills | Status: AC | PRN

## 2020-08-16 NOTE — Progress Notes (Signed)
Per MD okay to treat on 08/18/20 with abnormal lab from 08/16/20 (AST 483, ALT 163, and total bili 21.8).

## 2020-08-16 NOTE — Telephone Encounter (Signed)
Critical value received from lab at 1018.  Tbili 21.8, AST 483.  Results given to Heinz Knuckles, RN with Dr Lindi Adie at 1020.  Pt seeing MD today.

## 2020-08-16 NOTE — Telephone Encounter (Signed)
Oral Chemotherapy Pharmacist Encounter   Patient will not be starting on Rozlytrek (entrectinib) at this time. Oral chemotherapy clinic will be available for assistance if patient is switched to oral therapy in the future.   Leron Croak, PharmD, BCPS Hematology/Oncology Clinical Pharmacist [facility] [phone] 08/16/2020 1:24 PM

## 2020-08-16 NOTE — Progress Notes (Unsigned)
CRITICAL VALUE STICKER  CRITICAL VALUE: Total Bili 21.8 AST 483  Fidel Levy, RN  DATE & TIME NOTIFIED: 08/16/20 at 84  MD NOTIFIED: [name], MD  TIME OF NOTIFICATION:08/16/20 at 1022  RESPONSE: MD notified, verbalized understanding. No orders received at this time.

## 2020-08-16 NOTE — Progress Notes (Signed)
Per MD request pt to be prescribed Zofram 8 mg p.o.8 hrs PRN for nausea and vomiting.  Prescription sent to pharmacy on file.

## 2020-08-17 ENCOUNTER — Other Ambulatory Visit: Payer: Self-pay | Admitting: Hematology and Oncology

## 2020-08-17 LAB — CANCER ANTIGEN 27.29: CA 27.29: 187.7 U/mL — ABNORMAL HIGH (ref 0.0–38.6)

## 2020-08-18 ENCOUNTER — Other Ambulatory Visit: Payer: Self-pay

## 2020-08-18 ENCOUNTER — Inpatient Hospital Stay: Payer: BC Managed Care – PPO

## 2020-08-18 VITALS — BP 121/77 | HR 103 | Temp 98.1°F | Resp 18

## 2020-08-18 DIAGNOSIS — C50412 Malignant neoplasm of upper-outer quadrant of left female breast: Secondary | ICD-10-CM

## 2020-08-18 DIAGNOSIS — C50919 Malignant neoplasm of unspecified site of unspecified female breast: Secondary | ICD-10-CM

## 2020-08-18 MED ORDER — PROCHLORPERAZINE MALEATE 10 MG PO TABS
10.0000 mg | ORAL_TABLET | Freq: Once | ORAL | Status: AC
Start: 2020-08-18 — End: 2020-08-18
  Administered 2020-08-18: 10 mg via ORAL

## 2020-08-18 MED ORDER — PROCHLORPERAZINE MALEATE 10 MG PO TABS
ORAL_TABLET | ORAL | Status: AC
  Filled 2020-08-18: qty 1

## 2020-08-18 MED ORDER — SODIUM CHLORIDE 0.9 % IV SOLN
Freq: Once | INTRAVENOUS | Status: AC
  Filled 2020-08-18: qty 250

## 2020-08-18 MED ORDER — SODIUM CHLORIDE 0.9 % IV SOLN
800.0000 mg/m2 | Freq: Once | INTRAVENOUS | Status: AC
  Administered 2020-08-18: 1786 mg via INTRAVENOUS
  Filled 2020-08-18: qty 46.97

## 2020-08-18 NOTE — Progress Notes (Signed)
Per Dr. Lindi Adie, ok to treat with HR 103.

## 2020-08-18 NOTE — Patient Instructions (Signed)
[facility]  Discharge Instructions: Thank you for choosing [facility] to provide your oncology and hematology care.   If you have a lab appointment with the Cancer Center, please go directly to the Cancer Center and check in at the registration area.   Wear comfortable clothing and clothing appropriate for easy access to any Portacath or PICC line.   We strive to give you quality time with your provider. You may need to reschedule your appointment if you arrive late (15 or more minutes).  Arriving late affects you and other patients whose appointments are after yours.  Also, if you miss three or more appointments without notifying the office, you may be dismissed from the clinic at the provider's discretion.      For prescription refill requests, have your pharmacy contact our office and allow 72 hours for refills to be completed.    Today you received the following chemotherapy and/or immunotherapy agents Gemzar    To help prevent nausea and vomiting after your treatment, we encourage you to take your nausea medication as directed.  BELOW ARE SYMPTOMS THAT SHOULD BE REPORTED IMMEDIATELY: *FEVER GREATER THAN 100.4 F (38 C) OR HIGHER *CHILLS OR SWEATING *NAUSEA AND VOMITING THAT IS NOT CONTROLLED WITH YOUR NAUSEA MEDICATION *UNUSUAL SHORTNESS OF BREATH *UNUSUAL BRUISING OR BLEEDING *URINARY PROBLEMS (pain or burning when urinating, or frequent urination) *BOWEL PROBLEMS (unusual diarrhea, constipation, pain near the anus) TENDERNESS IN MOUTH AND THROAT WITH OR WITHOUT PRESENCE OF ULCERS (sore throat, sores in mouth, or a toothache) UNUSUAL RASH, SWELLING OR PAIN  UNUSUAL VAGINAL DISCHARGE OR ITCHING   Items with * indicate a potential emergency and should be followed up as soon as possible or go to the Emergency Department if any problems should occur.  Please show the CHEMOTHERAPY ALERT CARD or IMMUNOTHERAPY ALERT CARD at check-in to the  Emergency Department and triage nurse.  Should you have questions after your visit or need to cancel or reschedule your appointment, please contact [facility]  Dept: [phone]  and follow the prompts.  Office hours are 8:00 a.m. to 4:30 p.m. Monday - Friday. Please note that voicemails left after 4:00 p.m. may not be returned until the following business day.  We are closed weekends and major holidays. You have access to a nurse at all times for urgent questions. Please call the main number to the clinic Dept: [phone] and follow the prompts.   For any non-urgent questions, you may also contact your provider using MyChart. We now offer e-Visits for anyone 18 and older to request care online for non-urgent symptoms. For details visit [email or web address].   Also download the MyChart app! Go to the app store, search "MyChart", open the app, select [facility], and log in with your MyChart username and password.  Due to Covid, a mask is required upon entering the hospital/clinic. If you do not have a mask, one will be given to you upon arrival. For doctor visits, patients may have 1 support person aged 18 or older with them. For treatment visits, patients cannot have anyone with them due to current Covid guidelines and our immunocompromised population.   Gemcitabine injection What is this medication? GEMCITABINE (jem SYE ta been) is a chemotherapy drug. This medicine is used to treat many types of cancer like breast cancer, lung cancer, pancreatic cancer,and ovarian cancer. This medicine may be used for other purposes; ask your health care provider orpharmacist if you have   questions. COMMON BRAND NAME(S): Gemzar, Infugem What should I tell my care team before I take this medication? They need to know if you have any of these conditions: blood disorders infection kidney disease liver disease lung or breathing disease, like asthma recent or ongoing radiation  therapy an unusual or allergic reaction to gemcitabine, other chemotherapy, other medicines, foods, dyes, or preservatives pregnant or trying to get pregnant breast-feeding How should I use this medication? This drug is given as an infusion into a vein. It is administered in a hospitalor clinic by a specially trained health care professional. Talk to your pediatrician regarding the use of this medicine in children.Special care may be needed. Overdosage: If you think you have taken too much of this medicine contact apoison control center or emergency room at once. NOTE: This medicine is only for you. Do not share this medicine with others. What if I miss a dose? It is important not to miss your dose. Call your doctor or health careprofessional if you are unable to keep an appointment. What may interact with this medication? medicines to increase blood counts like filgrastim, pegfilgrastim, sargramostim some other chemotherapy drugs like cisplatin vaccines Talk to your doctor or health care professional before taking any of thesemedicines: acetaminophen aspirin ibuprofen ketoprofen naproxen This list may not describe all possible interactions. Give your health care provider a list of all the medicines, herbs, non-prescription drugs, or dietary supplements you use. Also tell them if you smoke, drink alcohol, or use illegaldrugs. Some items may interact with your medicine. What should I watch for while using this medication? Visit your doctor for checks on your progress. This drug may make you feel generally unwell. This is not uncommon, as chemotherapy can affect healthy cells as well as cancer cells. Report any side effects. Continue your course oftreatment even though you feel ill unless your doctor tells you to stop. In some cases, you may be given additional medicines to help with side effects.Follow all directions for their use. Call your doctor or health care professional for advice if  you get a fever, chills or sore throat, or other symptoms of a cold or flu. Do not treat yourself. This drug decreases your body's ability to fight infections. Try toavoid being around people who are sick. This medicine may increase your risk to bruise or bleed. Call your doctor orhealth care professional if you notice any unusual bleeding. Be careful brushing and flossing your teeth or using a toothpick because you may get an infection or bleed more easily. If you have any dental work done,tell your dentist you are receiving this medicine. Avoid taking products that contain aspirin, acetaminophen, ibuprofen, naproxen, or ketoprofen unless instructed by your doctor. These medicines may hide afever. Do not become pregnant while taking this medicine or for 6 months after stopping it. Women should inform their doctor if they wish to become pregnant or think they might be pregnant. Men should not father a child while taking this medicine and for 3 months after stopping it. There is a potential for serious side effects to an unborn child. Talk to your health care professional or pharmacist for more information. Do not breast-feed an infant while takingthis medicine or for at least 1 week after stopping it. Men should inform their doctors if they wish to father a child. This medicine may lower sperm counts. Talk with your doctor or health care professional ifyou are concerned about your fertility. What side effects may I notice from receiving this medication?   Side effects that you should report to your doctor or health care professionalas soon as possible: allergic reactions like skin rash, itching or hives, swelling of the face, lips, or tongue breathing problems pain, redness, or irritation at site where injected signs and symptoms of a dangerous change in heartbeat or heart rhythm like chest pain; dizziness; fast or irregular heartbeat; palpitations; feeling faint or lightheaded, falls; breathing  problems signs of decreased platelets or bleeding - bruising, pinpoint red spots on the skin, black, tarry stools, blood in the urine signs of decreased red blood cells - unusually weak or tired, feeling faint or lightheaded, falls signs of infection - fever or chills, cough, sore throat, pain or difficulty passing urine signs and symptoms of kidney injury like trouble passing urine or change in the amount of urine signs and symptoms of liver injury like dark yellow or brown urine; general ill feeling or flu-like symptoms; light-colored stools; loss of appetite; nausea; right upper belly pain; unusually weak or tired; yellowing of the eyes or skin swelling of ankles, feet, hands Side effects that usually do not require medical attention (report to yourdoctor or health care professional if they continue or are bothersome): constipation diarrhea hair loss loss of appetite nausea rash vomiting This list may not describe all possible side effects. Call your doctor for medical advice about side effects. You may report side effects to FDA at1-800-FDA-1088. Where should I keep my medication? This drug is given in a hospital or clinic and will not be stored at home. NOTE: This sheet is a summary. It may not cover all possible information. If you have questions about this medicine, talk to your doctor, pharmacist, orhealth care provider.  2022 [employer] (2017-05-16 18:06:11)   

## 2020-08-21 ENCOUNTER — Emergency Department (HOSPITAL_COMMUNITY): Payer: BC Managed Care – PPO

## 2020-08-21 ENCOUNTER — Other Ambulatory Visit: Payer: Self-pay

## 2020-08-21 ENCOUNTER — Inpatient Hospital Stay (HOSPITAL_COMMUNITY)
Admission: EM | Admit: 2020-08-21 | Discharge: 2020-09-03 | DRG: 871 | Disposition: E | Payer: BC Managed Care – PPO | Source: Ambulatory Visit | Attending: Student | Admitting: Student

## 2020-08-21 ENCOUNTER — Encounter: Payer: Self-pay | Admitting: Hematology and Oncology

## 2020-08-21 ENCOUNTER — Encounter (HOSPITAL_COMMUNITY): Payer: Self-pay | Admitting: *Deleted

## 2020-08-21 DIAGNOSIS — J90 Pleural effusion, not elsewhere classified: Secondary | ICD-10-CM | POA: Diagnosis present

## 2020-08-21 DIAGNOSIS — A419 Sepsis, unspecified organism: Principal | ICD-10-CM | POA: Diagnosis present

## 2020-08-21 DIAGNOSIS — C50919 Malignant neoplasm of unspecified site of unspecified female breast: Secondary | ICD-10-CM | POA: Diagnosis present

## 2020-08-21 DIAGNOSIS — D701 Agranulocytosis secondary to cancer chemotherapy: Secondary | ICD-10-CM

## 2020-08-21 DIAGNOSIS — Z6836 Body mass index (BMI) 36.0-36.9, adult: Secondary | ICD-10-CM

## 2020-08-21 DIAGNOSIS — D689 Coagulation defect, unspecified: Secondary | ICD-10-CM | POA: Diagnosis present

## 2020-08-21 DIAGNOSIS — C50412 Malignant neoplasm of upper-outer quadrant of left female breast: Secondary | ICD-10-CM | POA: Diagnosis not present

## 2020-08-21 DIAGNOSIS — Z20822 Contact with and (suspected) exposure to covid-19: Secondary | ICD-10-CM | POA: Diagnosis present

## 2020-08-21 DIAGNOSIS — R6520 Severe sepsis without septic shock: Secondary | ICD-10-CM | POA: Diagnosis present

## 2020-08-21 DIAGNOSIS — T451X5A Adverse effect of antineoplastic and immunosuppressive drugs, initial encounter: Secondary | ICD-10-CM

## 2020-08-21 DIAGNOSIS — R188 Other ascites: Secondary | ICD-10-CM | POA: Diagnosis present

## 2020-08-21 DIAGNOSIS — F419 Anxiety disorder, unspecified: Secondary | ICD-10-CM | POA: Diagnosis present

## 2020-08-21 DIAGNOSIS — C787 Secondary malignant neoplasm of liver and intrahepatic bile duct: Secondary | ICD-10-CM | POA: Diagnosis not present

## 2020-08-21 DIAGNOSIS — Z808 Family history of malignant neoplasm of other organs or systems: Secondary | ICD-10-CM

## 2020-08-21 DIAGNOSIS — Z66 Do not resuscitate: Secondary | ICD-10-CM | POA: Diagnosis present

## 2020-08-21 DIAGNOSIS — Z170 Estrogen receptor positive status [ER+]: Secondary | ICD-10-CM | POA: Diagnosis not present

## 2020-08-21 DIAGNOSIS — E872 Acidosis: Secondary | ICD-10-CM | POA: Diagnosis present

## 2020-08-21 DIAGNOSIS — Z8249 Family history of ischemic heart disease and other diseases of the circulatory system: Secondary | ICD-10-CM

## 2020-08-21 DIAGNOSIS — D6959 Other secondary thrombocytopenia: Secondary | ICD-10-CM | POA: Diagnosis present

## 2020-08-21 DIAGNOSIS — Z803 Family history of malignant neoplasm of breast: Secondary | ICD-10-CM

## 2020-08-21 DIAGNOSIS — Z807 Family history of other malignant neoplasms of lymphoid, hematopoietic and related tissues: Secondary | ICD-10-CM

## 2020-08-21 DIAGNOSIS — Z800 Family history of malignant neoplasm of digestive organs: Secondary | ICD-10-CM

## 2020-08-21 DIAGNOSIS — R7401 Elevation of levels of liver transaminase levels: Secondary | ICD-10-CM | POA: Diagnosis present

## 2020-08-21 DIAGNOSIS — E8770 Fluid overload, unspecified: Secondary | ICD-10-CM | POA: Diagnosis present

## 2020-08-21 DIAGNOSIS — I10 Essential (primary) hypertension: Secondary | ICD-10-CM | POA: Diagnosis present

## 2020-08-21 DIAGNOSIS — C7951 Secondary malignant neoplasm of bone: Secondary | ICD-10-CM | POA: Diagnosis not present

## 2020-08-21 DIAGNOSIS — E871 Hypo-osmolality and hyponatremia: Secondary | ICD-10-CM | POA: Diagnosis present

## 2020-08-21 DIAGNOSIS — Z823 Family history of stroke: Secondary | ICD-10-CM

## 2020-08-21 DIAGNOSIS — E860 Dehydration: Secondary | ICD-10-CM | POA: Diagnosis present

## 2020-08-21 DIAGNOSIS — E669 Obesity, unspecified: Secondary | ICD-10-CM | POA: Diagnosis present

## 2020-08-21 DIAGNOSIS — N179 Acute kidney failure, unspecified: Secondary | ICD-10-CM | POA: Diagnosis present

## 2020-08-21 DIAGNOSIS — R17 Unspecified jaundice: Secondary | ICD-10-CM

## 2020-08-21 DIAGNOSIS — D696 Thrombocytopenia, unspecified: Secondary | ICD-10-CM | POA: Diagnosis present

## 2020-08-21 DIAGNOSIS — Z9221 Personal history of antineoplastic chemotherapy: Secondary | ICD-10-CM

## 2020-08-21 DIAGNOSIS — K831 Obstruction of bile duct: Secondary | ICD-10-CM | POA: Diagnosis present

## 2020-08-21 DIAGNOSIS — G9341 Metabolic encephalopathy: Secondary | ICD-10-CM | POA: Diagnosis present

## 2020-08-21 DIAGNOSIS — Z9049 Acquired absence of other specified parts of digestive tract: Secondary | ICD-10-CM

## 2020-08-21 DIAGNOSIS — Z79899 Other long term (current) drug therapy: Secondary | ICD-10-CM

## 2020-08-21 DIAGNOSIS — Z923 Personal history of irradiation: Secondary | ICD-10-CM

## 2020-08-21 DIAGNOSIS — K219 Gastro-esophageal reflux disease without esophagitis: Secondary | ICD-10-CM | POA: Diagnosis present

## 2020-08-21 DIAGNOSIS — R7989 Other specified abnormal findings of blood chemistry: Secondary | ICD-10-CM | POA: Diagnosis present

## 2020-08-21 DIAGNOSIS — E875 Hyperkalemia: Secondary | ICD-10-CM | POA: Diagnosis present

## 2020-08-21 DIAGNOSIS — Z515 Encounter for palliative care: Secondary | ICD-10-CM

## 2020-08-21 DIAGNOSIS — K7290 Hepatic failure, unspecified without coma: Secondary | ICD-10-CM

## 2020-08-21 DIAGNOSIS — K7291 Hepatic failure, unspecified with coma: Secondary | ICD-10-CM | POA: Diagnosis present

## 2020-08-21 LAB — BASIC METABOLIC PANEL
Anion gap: 15 (ref 5–15)
Anion gap: 13 (ref 5–15)
BUN: 42 mg/dL — ABNORMAL HIGH (ref 6–20)
BUN: 47 mg/dL — ABNORMAL HIGH (ref 6–20)
CO2: 16 mmol/L — ABNORMAL LOW (ref 22–32)
CO2: 17 mmol/L — ABNORMAL LOW (ref 22–32)
Calcium: 7.2 mg/dL — ABNORMAL LOW (ref 8.9–10.3)
Calcium: 7.4 mg/dL — ABNORMAL LOW (ref 8.9–10.3)
Chloride: 97 mmol/L — ABNORMAL LOW (ref 98–111)
Chloride: 99 mmol/L (ref 98–111)
Creatinine, Ser: 3.61 mg/dL — ABNORMAL HIGH (ref 0.44–1.00)
Creatinine, Ser: 3.72 mg/dL — ABNORMAL HIGH (ref 0.44–1.00)
GFR, Estimated: 16 mL/min — ABNORMAL LOW (ref >60)
GFR, Estimated: 15 mL/min — ABNORMAL LOW (ref >60)
Glucose, Bld: 131 mg/dL — ABNORMAL HIGH (ref 70–99)
Glucose, Bld: 117 mg/dL — ABNORMAL HIGH (ref 70–99)
Potassium: 4.1 mmol/L (ref 3.5–5.1)
Potassium: 5.2 mmol/L — ABNORMAL HIGH (ref 3.5–5.1)
Sodium: 128 mmol/L — ABNORMAL LOW (ref 135–145)
Sodium: 129 mmol/L — ABNORMAL LOW (ref 135–145)

## 2020-08-21 LAB — APTT: aPTT: 37 seconds — ABNORMAL HIGH (ref 24–36)

## 2020-08-21 LAB — I-STAT BETA HCG BLOOD, ED (MC, WL, AP ONLY): I-stat hCG, quantitative: 6.6 m[IU]/mL — ABNORMAL HIGH (ref <5)

## 2020-08-21 LAB — LIPASE, BLOOD: Lipase: 77 U/L — ABNORMAL HIGH (ref 11–51)

## 2020-08-21 LAB — URINALYSIS, ROUTINE W REFLEX MICROSCOPIC
Glucose, UA: NEGATIVE mg/dL
Ketones, ur: NEGATIVE mg/dL
Nitrite: NEGATIVE
Protein, ur: 30 mg/dL — AB
Specific Gravity, Urine: 1.014 (ref 1.005–1.030)
pH: 5 (ref 5.0–8.0)

## 2020-08-21 LAB — RESP PANEL BY RT-PCR (FLU A&B, COVID) ARPGX2
Influenza A by PCR: NEGATIVE
Influenza B by PCR: NEGATIVE
SARS Coronavirus 2 by RT PCR: NEGATIVE

## 2020-08-21 LAB — CBC WITH DIFFERENTIAL/PLATELET
Abs Immature Granulocytes: 0.02 10*3/uL (ref 0.00–0.07)
Basophils Absolute: 0 10*3/uL (ref 0.0–0.1)
Basophils Relative: 0 %
Eosinophils Absolute: 0 10*3/uL (ref 0.0–0.5)
Eosinophils Relative: 0 %
HCT: 39.1 % (ref 36.0–46.0)
Hemoglobin: 13.5 g/dL (ref 12.0–15.0)
Immature Granulocytes: 1 %
Lymphocytes Relative: 4 %
Lymphs Abs: 0.1 10*3/uL — ABNORMAL LOW (ref 0.7–4.0)
MCH: 38.4 pg — ABNORMAL HIGH (ref 26.0–34.0)
MCHC: 34.5 g/dL (ref 30.0–36.0)
MCV: 111.1 fL — ABNORMAL HIGH (ref 80.0–100.0)
Monocytes Absolute: 0 10*3/uL — ABNORMAL LOW (ref 0.1–1.0)
Monocytes Relative: 1 %
Neutro Abs: 2.2 10*3/uL (ref 1.7–7.7)
Neutrophils Relative %: 94 %
Platelets: 107 10*3/uL — ABNORMAL LOW (ref 150–400)
RBC: 3.52 MIL/uL — ABNORMAL LOW (ref 3.87–5.11)
RDW: 17.2 % — ABNORMAL HIGH (ref 11.5–15.5)
WBC: 2.3 10*3/uL — ABNORMAL LOW (ref 4.0–10.5)
nRBC: 0 % (ref 0.0–0.2)

## 2020-08-21 LAB — COMPREHENSIVE METABOLIC PANEL
ALT: 130 U/L — ABNORMAL HIGH (ref 0–44)
ALT: 115 U/L — ABNORMAL HIGH (ref 0–44)
AST: 512 U/L — ABNORMAL HIGH (ref 15–41)
AST: 444 U/L — ABNORMAL HIGH (ref 15–41)
Albumin: 2.2 g/dL — ABNORMAL LOW (ref 3.5–5.0)
Albumin: 2 g/dL — ABNORMAL LOW (ref 3.5–5.0)
Alkaline Phosphatase: 337 U/L — ABNORMAL HIGH (ref 38–126)
Alkaline Phosphatase: 276 U/L — ABNORMAL HIGH (ref 38–126)
Anion gap: 17 — ABNORMAL HIGH (ref 5–15)
Anion gap: 17 — ABNORMAL HIGH (ref 5–15)
BUN: 40 mg/dL — ABNORMAL HIGH (ref 6–20)
BUN: 41 mg/dL — ABNORMAL HIGH (ref 6–20)
CO2: 16 mmol/L — ABNORMAL LOW (ref 22–32)
CO2: 13 mmol/L — ABNORMAL LOW (ref 22–32)
Calcium: 8.1 mg/dL — ABNORMAL LOW (ref 8.9–10.3)
Calcium: 7.5 mg/dL — ABNORMAL LOW (ref 8.9–10.3)
Chloride: 96 mmol/L — ABNORMAL LOW (ref 98–111)
Chloride: 98 mmol/L (ref 98–111)
Creatinine, Ser: 4.18 mg/dL — ABNORMAL HIGH (ref 0.44–1.00)
Creatinine, Ser: 4.05 mg/dL — ABNORMAL HIGH (ref 0.44–1.00)
GFR, Estimated: 13 mL/min — ABNORMAL LOW (ref >60)
GFR, Estimated: 14 mL/min — ABNORMAL LOW (ref >60)
Glucose, Bld: 103 mg/dL — ABNORMAL HIGH (ref 70–99)
Glucose, Bld: 95 mg/dL (ref 70–99)
Potassium: 4.1 mmol/L (ref 3.5–5.1)
Potassium: 4.1 mmol/L (ref 3.5–5.1)
Sodium: 129 mmol/L — ABNORMAL LOW (ref 135–145)
Sodium: 128 mmol/L — ABNORMAL LOW (ref 135–145)
Total Bilirubin: 30.9 mg/dL (ref 0.3–1.2)
Total Bilirubin: 26.8 mg/dL (ref 0.3–1.2)
Total Protein: 6.2 g/dL — ABNORMAL LOW (ref 6.5–8.1)
Total Protein: 5.3 g/dL — ABNORMAL LOW (ref 6.5–8.1)

## 2020-08-21 LAB — BLOOD GAS, VENOUS
Acid-base deficit: 13 mmol/L — ABNORMAL HIGH (ref 0.0–2.0)
Bicarbonate: 12.1 mmol/L — ABNORMAL LOW (ref 20.0–28.0)
O2 Saturation: 79.7 %
Patient temperature: 98.6
pCO2, Ven: 26.4 mmHg — ABNORMAL LOW (ref 44.0–60.0)
pH, Ven: 7.282 (ref 7.250–7.430)
pO2, Ven: 53.4 mmHg — ABNORMAL HIGH (ref 32.0–45.0)

## 2020-08-21 LAB — LACTIC ACID, PLASMA
Lactic Acid, Venous: 4.7 mmol/L (ref 0.5–1.9)
Lactic Acid, Venous: 4.6 mmol/L (ref 0.5–1.9)

## 2020-08-21 LAB — PROCALCITONIN: Procalcitonin: 27.82 ng/mL

## 2020-08-21 LAB — PROTIME-INR
INR: 2.3 — ABNORMAL HIGH (ref 0.8–1.2)
Prothrombin Time: 25.3 seconds — ABNORMAL HIGH (ref 11.4–15.2)

## 2020-08-21 LAB — AMMONIA: Ammonia: 82 umol/L — ABNORMAL HIGH (ref 9–35)

## 2020-08-21 MED ORDER — METRONIDAZOLE 500 MG/100ML IV SOLN
500.0000 mg | Freq: Once | INTRAVENOUS | Status: AC
  Administered 2020-08-21: 500 mg via INTRAVENOUS
  Filled 2020-08-21: qty 100

## 2020-08-21 MED ORDER — ONDANSETRON HCL 4 MG/2ML IJ SOLN
4.0000 mg | Freq: Four times a day (QID) | INTRAMUSCULAR | Status: DC | PRN
  Administered 2020-08-21: 4 mg via INTRAVENOUS
  Filled 2020-08-21: qty 2

## 2020-08-21 MED ORDER — LACTATED RINGERS IV SOLN: INTRAVENOUS | Status: DC

## 2020-08-21 MED ORDER — SODIUM CHLORIDE 0.9 % IV SOLN
2.0000 g | Freq: Once | INTRAVENOUS | Status: AC
  Administered 2020-08-21: 2 g via INTRAVENOUS
  Filled 2020-08-21: qty 2

## 2020-08-21 MED ORDER — ONDANSETRON HCL 4 MG PO TABS: 4.0000 mg | ORAL_TABLET | Freq: Four times a day (QID) | ORAL | Status: DC | PRN

## 2020-08-21 MED ORDER — SODIUM CHLORIDE 0.9 % IV BOLUS
1000.0000 mL | Freq: Once | INTRAVENOUS | Status: AC
  Administered 2020-08-21: 1000 mL via INTRAVENOUS

## 2020-08-21 MED ORDER — MORPHINE SULFATE (PF) 2 MG/ML IV SOLN
2.0000 mg | INTRAVENOUS | Status: DC | PRN
  Administered 2020-08-21 – 2020-08-22 (×3): 2 mg via INTRAVENOUS
  Filled 2020-08-21 (×3): qty 1

## 2020-08-21 MED ORDER — LACTATED RINGERS IV BOLUS (SEPSIS)
2000.0000 mL | Freq: Once | INTRAVENOUS | Status: AC
  Administered 2020-08-21: 2000 mL via INTRAVENOUS

## 2020-08-21 MED ORDER — SODIUM CHLORIDE 0.9 % IV SOLN
2.0000 g | Freq: Three times a day (TID) | INTRAVENOUS | Status: DC
  Administered 2020-08-21 – 2020-08-22 (×2): 2 g via INTRAVENOUS
  Filled 2020-08-21 (×3): qty 2

## 2020-08-21 MED ORDER — SODIUM CHLORIDE 0.9 % IV SOLN: INTRAVENOUS | Status: DC

## 2020-08-21 MED ORDER — OXYCODONE HCL 5 MG PO TABS
5.0000 mg | ORAL_TABLET | Freq: Once | ORAL | Status: AC
  Administered 2020-08-21: 5 mg via ORAL
  Filled 2020-08-21: qty 1

## 2020-08-21 MED ORDER — SODIUM BICARBONATE 8.4 % IV SOLN
INTRAVENOUS | Status: DC
  Filled 2020-08-21: qty 1000
  Filled 2020-08-21 (×2): qty 150

## 2020-08-21 MED ORDER — OXYCODONE-ACETAMINOPHEN 5-325 MG PO TABS: 1.0000 | ORAL_TABLET | Freq: Once | ORAL | Status: DC

## 2020-08-21 MED ORDER — METRONIDAZOLE 500 MG/100ML IV SOLN
500.0000 mg | Freq: Three times a day (TID) | INTRAVENOUS | Status: DC
  Administered 2020-08-21 – 2020-08-22 (×2): 500 mg via INTRAVENOUS
  Filled 2020-08-21 (×2): qty 100

## 2020-08-21 NOTE — H&P (Signed)
History and Physical    Bethany Hanna BWG:[id number] DOB: [birth year] DOA: 08/10/2020  PCP: [name], MD   Patient coming from: Home.  I have personally briefly reviewed patient's old medical records in [place]  Chief Complaint: Abdominal swelling, confusion and fatigue.  HPI: Bethany Hanna is a 40 y.o. 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  She underwent bilateral salpingo-oophorectomy.  She saw her primary care doctor about 2 weeks ago and was found to have an elevated BP reading up to 177 mmHg systolic.  She was started on amlodipine, but developed dizziness and did not feel well after taking it.  A few days later, she stated that she noticed that her eyes were jaundiced and her umbilical hernia had worsened.  She underwent an MRCP and discharged home.  After the discharge she was given gemcitabine salvage chemotherapy by Dr. Lindi Adie on June 15.  She presented this morning to the emergency department with her husband stating that she has had a progressively worsening somnolence, confusion, abdominal distention with almost no oral intake other than an occasional sip of fluids.  She continues to be somnolent and unable to provide further information at this time.  ED Course: Initial vital signs were temperature 97.5 F, pulse 124, respirations 15, BP 130/82 mmHg and O2 sat 95% on room air.  The patient received cefepime, metronidazole and 1000 mL of normal saline bolus.  Oncology evaluated the patient and recommended medical treatment to transition to palliative care.  PCCM does not think they would do anything different in terms of  her care that would change her prognosis.   Lab work: CBC showed a white count of 2.3, hemoglobin 13.5 g/dL with an MCV of 111.1 fL and platelets 107.  PT was 25.3, INR 2.3 and PTT 37.  SARS coronavirus 2 was negative.  Lactic acid was 4.6 mmol/L.  Sodium 129, potassium 4.1, chloride 96 and CO2 16 minimal/L.  Anion gap was 17.  Glucose 103, BUN 40, creatinine 4.18 and calcium 8.1 mg/dL.  Total protein 6.2 and albumin 2.2 g/dL.  AST 512, ALT 130 and alkaline phosphatase 337 units/L.  Total bilirubin is 30.9 mg/dL.  And venous gas showed a pH of 7.282, PCO2 of 26.4 and PO2 of 53.27mmHg.  Bicarbonate was 12.1 and acid base deficit 13.0 mmol/L.  Imaging: A portable 1 view chest radiograph show hypoventilation with decreased lung volumes.  CT abdomen/pelvis without contrast shows stable hepatomegaly.  Diffuse hepatic metastatic disease shown on recent MRI not seen clearly due to the lack of IV contrast.  There is increasing mild to moderate ascites.  Increased diffuse mesenteric edema and peripancreatic soft tissue stranding that could be related to mild pancreatitis or generalized third spacing.  There is mild diffuse colonic wall thickening which could be due to colitis or hypoalbuminemia.  New tiny bilateral pleural effusions with mild bibasilar atelectasis.  Please see images and full radiology report for further details.  Review of Systems: As per HPI otherwise all other systems reviewed and are negative.  Past Medical History:  Diagnosis Date   Anxiety    SITUational -uses xanax during the day, reports the ativan is for bedtime   Elevated blood pressure reading in  office with white coat syndrome, without diagnosis of hypertension    Family history of colon cancer    Family history of melanoma    GERD (gastroesophageal reflux disease)    History of cancer chemotherapy    left breast cancer--- 05-28-2018 to 10-08-2018   History of migraine    Hypertension    Malignant neoplasm of upper-outer  quadrant of left breast in female, estrogen receptor positive [facility]) oncologist--- dr Lindi Adie   dx 05-13-2018, clinical stage IIB, grade 3, invasive mammary carcinoma (ER/PR +, HER2 negative);  completed chemotherapy 10-08-2018;  11-05-2018 s/p left breast lumpectomy w/ node dissection's and Re-excision 11-18-2018;  started radiation therapy 12-30-2018 to complete 02-14-2019   PONV (postoperative nausea and vomiting)    Radiation burn    02-13-2019  per pt left breast , current radiation treatment   Past Surgical History:  Procedure Laterality Date   BREAST LUMPECTOMY WITH RADIOACTIVE SEED AND AXILLARY LYMPH NODE DISSECTION Left 11/05/2018   Procedure: LEFT BREAST LUMPECTOMY WITH BRACKETED RADIOACTIVE SEED AND LEFT AXILLARY LYMPH NODE DISSECTION;  Surgeon: [name], MD;  Location: [place];  Service: General;  Laterality: Left;   CHOLECYSTECTOMY N/A 04/09/2015   Procedure: LAPAROSCOPIC CHOLECYSTECTOMY WITH INTRAOPERATIVE CHOLANGIOGRAM;  Surgeon: Autumn Messing III, MD;  Location: [facility];  Service: General;  Laterality: N/A;   DILATATION & CURRETTAGE/HYSTEROSCOPY WITH RESECTOCOPE N/A 07/31/2013   Procedure: Page;  Surgeon: Princess Bruins, MD;  Location: [place] [facility];  Service: Gynecology;  Laterality: N/A;  1 hr.   DILATION AND EVACUATION N/A 12/12/2017   Procedure: DILATATION AND EVACUATION (D&E) 2ND TRIMESTER;  Surgeon: Brien Few, MD;  Location: [place] [facility];  Service: Gynecology;  Laterality: N/A;   OPERATIVE ULTRASOUND N/A 12/12/2017   Procedure: OPERATIVE ULTRASOUND;  Surgeon: Brien Few, MD;  Location: [place] [facility];  Service: Gynecology;  Laterality: N/A;   PORT-A-CATH REMOVAL Right 11/05/2018   Procedure: REMOVAL PORT-A-CATH;  Surgeon: [name], MD;  Location: [place];  Service: General;  Laterality: Right;   PORTACATH PLACEMENT Right 05/27/2018   Procedure: INSERTION PORT-A-CATH WITH ULTRASOUND;  Surgeon: [name], MD;  Location: [place];  Service: General;  Laterality: Right;  GENERAL AND LMA   RE-EXCISION OF BREAST CANCER,SUPERIOR MARGINS Left 11/18/2018   Procedure: RE-EXCISION OF LEFT BREAST MARGIN;  Surgeon: [name], MD;  Location: [place];  Service: General;  Laterality: Left;   ROBOTIC ASSISTED SALPINGO OOPHERECTOMY Bilateral 02/18/2019   Procedure: XI ROBOTIC ASSISTED SALPINGO OOPHORECTOMY;  Surgeon: Brien Few, MD;  Location: [place];  Service: Gynecology;  Laterality: Bilateral;  Requests 1 hr. RNFA Leana Roe confirmed on 02/13/19 CS   Social History  reports that she has never smoked. She has never used smokeless tobacco. She reports current alcohol use of about 2.0 - 3.0 standard drinks of alcohol per week. She reports that she does not use drugs.  No Known Allergies  Family History  Problem Relation Age of Onset   Arthritis Mother    Migraines Mother    ADD / ADHD Mother    Varicose Veins Mother    Hypertension Father    Alcohol abuse Brother    Varicose Veins Brother    Stroke Maternal Grandmother    Melanoma Maternal Grandmother    Pulmonary fibrosis Maternal Grandfather    Mitral valve prolapse Paternal Grandmother    Migraines Paternal Grandmother    Lymphoma Paternal Jon Gills        was treated for this and did ok  Heart failure Paternal Grandfather    Eczema Son    Colon cancer Maternal Aunt 22   Prior to Admission medications   Medication Sig Start Date End Date Taking? Authorizing Provider  ibuprofen (ADVIL) 200 MG tablet Take 400-600 mg by mouth every 6 (six) hours as needed for fever, headache or mild pain.    [provider]  LORazepam (ATIVAN) 1 MG tablet Take 1 tablet (1 mg total) by mouth every 8 (eight) hours as needed for anxiety (or nausea). 08/12/20   Georgette Shell, MD  ondansetron (ZOFRAN) 8 MG tablet Take 1 tablet (8 mg total) by mouth every 8 (eight) hours as needed for nausea or vomiting. 08/16/20   [name],  MD  oxyCODONE (ROXICODONE) 5 MG immediate release tablet Take 1 tablet (5 mg total) by mouth every 4 (four) hours as needed. 08/12/20 09/11/20  Georgette Shell, MD  traZODone (DESYREL) 50 MG tablet Take 0.5-1 tablets (25-50 mg total) by mouth at bedtime as needed for sleep. 08/04/20   Maximiano Coss, NP    Physical Exam: Vitals:   08/04/2020 1053 08/10/2020 1100 08/25/2020 1145 08/25/2020 1230  BP: 104/79 107/60 (!) 126/92 108/80  Pulse: (!) 117 (!) 110 (!) 122 (!) 107  Resp: 18 15 15 14   Temp:      TempSrc:      SpO2: 95% 96% 96% 96%    Constitutional: Looks acutely and chronically ill. Eyes: PERRL, lids and conjunctivae normal.  Icteric and injected sclera. ENMT: Mucous membranes are very dry.  Lips are dry and cracked.  Posterior pharynx clear of any exudate or lesions. Neck: normal, supple, no masses, no thyromegaly Respiratory: Decreased breath sounds on bases, otherwise clear to auscultation bilaterally, no wheezing, no crackles. Normal respiratory effort. No accessory muscle use.  Cardiovascular: Tachycardic in the 100s 110s, no murmurs / rubs / gallops. No extremity edema. 2+ pedal pulses. No carotid bruits.  Abdomen: Obese, mildly distended.  Positive umbilical hernia.  Bowel sounds positive.  Soft, no tenderness, no masses palpated. No hepatosplenomegaly. Musculoskeletal: Moderate generalized weakness.  No clubbing / cyanosis. Good ROM, no contractures. Normal muscle tone.  Skin: The skin is jaundiced.  Some small areas of ecchymosis on extremities. Neurologic: Grossly nonfocal.  Unable to fully examine. Psychiatric: Somnolent.  Wakes up briefly.  Oriented to name.  Answers some simple questions.  Labs on Admission: I have personally reviewed following labs and imaging studies  CBC: Recent Labs  Lab 08/16/20 0920 08/11/2020 0934  WBC 4.8 2.3*  NEUTROABS 3.9 2.2  HGB 13.3 13.5  HCT 37.5 39.1  MCV 109.6* 111.1*  PLT 151 107*    Basic Metabolic Panel: Recent Labs  Lab  08/16/20 0920 08/05/2020 0934  NA 137 129*  K 3.9 4.1  CL 101 96*  CO2 20* 16*  GLUCOSE 97 103*  BUN 8 40*  CREATININE 0.73 4.18*  CALCIUM 8.8* 8.1*  MG 1.9  --     GFR: Estimated Creatinine Clearance: 22.6 mL/min (A) (by C-G formula based on SCr of 4.18 mg/dL (H)).  Liver Function Tests: Recent Labs  Lab 08/16/20 0920 08/18/2020 0934  AST 483* 512*  ALT 163* 130*  ALKPHOS 392* 337*  BILITOT 21.8* 30.9*  PROT 6.1* 6.2*  ALBUMIN 2.3* 2.2*   Radiological Exams on Admission: CT ABDOMEN PELVIS WO CONTRAST  Result Date: 08/25/2020 CLINICAL DATA:  Metastatic breast carcinoma. Jaundice. Worsening abdominal distension. EXAM: CT ABDOMEN AND PELVIS WITHOUT CONTRAST TECHNIQUE: Multidetector CT imaging of the abdomen and  pelvis was performed following the standard protocol without IV contrast. COMPARISON:  08/10/2020 FINDINGS: Lower chest: New mild bibasilar atelectasis. New tiny bilateral pleural effusions. Hepatobiliary: Hepatomegaly remains stable. Diffuse hepatic metastatic disease shown on recent MRI is not well visualized on this unenhanced exam. Prior cholecystectomy. No evidence of biliary obstruction. Mild to moderate ascites shows mild increase since prior study. Pancreas: Mild peripancreatic soft tissue stranding and diffuse mesenteric edema are increased since previous study. This could be due to mild pancreatitis or generalized 3rd spacing. Spleen:  Within normal limits in size. Adrenals/Urinary tract: No evidence of urolithiasis or hydronephrosis. Unremarkable unopacified urinary bladder. Stomach/Bowel: Increased mild diffuse colonic wall thickening, which could be due to colitis or hypoalbuminemia. No evidence of obstruction, focal inflammatory process, or abscess. Vascular/Lymphatic: No pathologically enlarged lymph nodes identified. No evidence of abdominal aortic aneurysm. Reproductive:  No mass or other significant abnormality. Other:  None. Musculoskeletal: Several sclerotic bone  metastases in the lumbosacral spine and pelvis remains stable. IMPRESSION: Stable hepatomegaly. Diffuse hepatic metastatic disease shown on recent MRI is not well visualized on this unenhanced exam. Stable sclerotic bone metastases. Increased mild to moderate ascites. Increased diffuse mesenteric edema and peripancreatic soft tissue stranding, which could be due to mild pancreatitis or generalized 3rd spacing. Increased mild diffuse colonic wall thickening, which could be due to colitis or hypoalbuminemia. New tiny bilateral pleural effusions and mild bibasilar atelectasis. Electronically Signed   By: Marlaine Hind M.D.   On: 08/09/2020 10:58   DG Chest Port 1 View  Result Date: 08/10/2020 CLINICAL DATA:  Question sepsis EXAM: PORTABLE CHEST 1 VIEW COMPARISON:  05/27/2018 FINDINGS: Hypoventilation with decreased lung volume compared to the prior study. Mild right lower lobe atelectasis. Negative for heart failure, or pneumonia. No pleural effusion. Port-A-Cath has been removed since the prior study. IMPRESSION: Hypoventilation with decreased lung volumes. Electronically Signed   By: Franchot Gallo M.D.   On: 09/01/2020 10:21    05/22/2020 Echo complete IMPRESSIONS    1. The left ventricle has normal systolic function with an ejection  fraction of 60-65%. The cavity size was normal. Left ventricular diastolic  parameters were normal. No evidence of left ventricular regional wall  motion abnormalities.   2. Normal average GLS of -18.5%.   3. The right ventricle has normal systolic function. The cavity was  normal. There is no increase in right ventricular wall thickness. Right  ventricular systolic pressure could not be assessed.   4. The aortic valve is tricuspid.   5. The aortic root, ascending aorta, aortic arch and descending aorta are  normal in size and structure.   EKG: Independently reviewed.  Vent. rate 114 BPM PR interval 152 ms QRS duration 82 ms QT/QTcB 327/451 ms P-R-T axes 33 129  49 Sinus tachycardia Abnormal lateral Q waves Anteroseptal infarct, age indeterminate  Assessment/Plan Principal Problem:   Severe sepsis with acute organ dysfunction ([place]) Place in observation/telemetry. Continue sodium bicarb infusion. Continue cefepime per pharmacy. Continue metronidazole 500 mg IVPB every 8 hours. Follow-up blood culture and sensitivity. Monitor CBC and CMP. Poor prognosis due to metastatic breast cancer.  Active Problems:   AKI (acute kidney injury) ([place]) Continue IV fluids. Monitor intake and output. Follow up renal function electrolytes.    Hyperbilirubinemia   Transaminitis GI has nothing to offer at this time. Continue IV fluids. Diphenhydramine as needed for itching. Check ammonia level. Check lipase level. Follow-up CMP in AM.    Thrombocytopenia (H[facility]) Monitor platelet count.    Metastatic breast  cancer [facility]) Dr. Hazeline Junker input appreciated.    Hypertension Monitor blood pressure. Antihypertensives as needed.    DVT prophylaxis: SCDs Code Status:    DNR. Family Communication:  [phone] Norm Parcel (Spouse) Disposition Plan:   Patient is from:  Home.  Anticipated DC to:  TBD.  Anticipated DC date:  26-Aug-2020.  Anticipated DC barriers: Clinical status and DC planning.  Consults called:  Oncology (Dr. Alen Blew). Admission status:  Observation/telemetry.   Severity of Illness: High severity in the setting of severe sepsis with multiorgan dysfunction due to metastatic to liver breast cancer causing liver failure and associated with acute kidney injury.  Reubin Milan MD [facility]  How to contact the [facility] Attending or Consulting provider [place] or covering provider during after hours [place], for this patient?   Check the care team in [facility] and look for a) attending/consulting [facility] provider listed and b) the [facility] team listed Log into [email or web address] and use [place] universal password to access. If you do not have the  password, please contact the hospital operator. Locate the [facility] provider you are looking for under [facility] and page to a number that you can be directly reached. If you still have difficulty reaching the provider, please page the [facility] (Director on Call) for the Hospitalists listed on amion for assistance.  08/28/2020, 1:36 PM   This document was prepared using Dragon voice recognition software and may contain some unintended transcription errors.

## 2020-08-21 NOTE — Sepsis Progress Note (Signed)
Secure chat with provider and full amount of fluids not given due to concerns for hepatorenal syndrome. Partial fluids ordered.

## 2020-08-21 NOTE — ED Notes (Signed)
Bladder scan: 481ml

## 2020-08-21 NOTE — Sepsis Progress Note (Signed)
Sepsis protocol is being followed by [facility]. 

## 2020-08-21 NOTE — ED Provider Notes (Signed)
[facility] DEPT Provider Note   CSN: [id number] Arrival date & time: 08/30/2020  0141     History Chief Complaint  Patient presents with   abdominal swelling   Jaundice    Bethany Hanna is a 40 y.o. female.  HPI  Patient presents with confusion abdominal swelling, liver mets, and fatigue.  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  She had her first chemo on Wednesday of this week.  Husband states in the last 3 days she has been having increased abdominal swelling, fatigue, being confused and not acting like herself.  Her abdomen is swollen significantly in the last 3 days.   Past Medical History:  Diagnosis Date   Anxiety    SITUational -uses xanax during the day, reports the ativan is for bedtime   Elevated blood pressure reading in office with white coat syndrome, without diagnosis of hypertension    Family history of colon cancer    Family history of melanoma    GERD (gastroesophageal reflux disease)    History of cancer chemotherapy    left breast cancer--- 05-28-2018 to 10-08-2018   History of migraine    Hypertension    Malignant neoplasm of upper-outer quadrant of left breast in female, estrogen receptor positive [facility]) oncologist--- dr Lindi Adie   dx 05-13-2018, clinical stage IIB, grade 3, invasive mammary carcinoma (ER/PR +, HER2 negative);  completed chemotherapy 10-08-2018;  11-05-2018 s/p left breast lumpectomy w/ node dissection's and Re-excision 11-18-2018;  started radiation therapy 12-30-2018 to complete 02-14-2019   PONV (postoperative nausea and vomiting)    Radiation burn    02-13-2019  per pt left breast , current radiation treatment    Patient Active Problem List   Diagnosis Date Noted   Severe sepsis with acute organ dysfunction ([place]) 08/20/2020   Hypertension    AKI (acute kidney injury) ([place])    Metastatic breast cancer ([place]) 08/16/2020   Hyperbilirubinemia  08/10/2020   Elevated blood pressure reading 08/10/2020   Tachycardia 08/10/2020   Leukopenia 08/10/2020   Thrombocytopenia ([place]) 08/10/2020   Transaminitis 08/10/2020   Breast cancer, left breast ([place]) 11/05/2018   Genetic testing 08/21/2018   Family history of colon cancer    Family history of melanoma    Port-A-Cath in place 06/11/2018   White coat syndrome without hypertension 05/22/2018   Malignant neoplasm of upper-outer quadrant of left breast in female, estrogen receptor positive ([place]) 05/16/2018    Past Surgical History:  Procedure Laterality Date   BREAST LUMPECTOMY WITH RADIOACTIVE SEED AND AXILLARY LYMPH NODE DISSECTION Left 11/05/2018   Procedure: LEFT BREAST LUMPECTOMY WITH BRACKETED RADIOACTIVE SEED AND LEFT AXILLARY LYMPH NODE DISSECTION;  Surgeon: [name], MD;  Location: [facility];  Service: General;  Laterality: Left;   CHOLECYSTECTOMY N/A 04/09/2015   Procedure: LAPAROSCOPIC CHOLECYSTECTOMY WITH INTRAOPERATIVE CHOLANGIOGRAM;  Surgeon: Autumn Messing III, MD;  Location: [facility];  Service: General;  Laterality: N/A;   DILATATION & CURRETTAGE/HYSTEROSCOPY WITH RESECTOCOPE N/A 07/31/2013   Procedure: DILATATION & CURETTAGE/HYSTEROSCOPY WITH RESECTOCOPE;  Surgeon: Princess Bruins, MD;  Location: [facility];  Service: Gynecology;  Laterality: N/A;  1 hr.   DILATION AND EVACUATION N/A 12/12/2017   Procedure: DILATATION AND EVACUATION (D&E) 2ND TRIMESTER;  Surgeon: Brien Few, MD;  Location: [facility];  Service: Gynecology;  Laterality: N/A;   OPERATIVE ULTRASOUND N/A 12/12/2017   Procedure: OPERATIVE ULTRASOUND;  Surgeon: Brien Few, MD;  Location: [facility];  Service: Gynecology;  Laterality: N/A;   PORT-A-CATH REMOVAL Right 11/05/2018   Procedure: REMOVAL PORT-A-CATH;  Surgeon: [name], MD;  Location: [facility];  Service: General;  Laterality: Right;   PORTACATH PLACEMENT Right 05/27/2018   Procedure: INSERTION PORT-A-CATH WITH ULTRASOUND;  Surgeon: [name], MD;   Location: [facility];  Service: General;  Laterality: Right;  GENERAL AND LMA   RE-EXCISION OF BREAST CANCER,SUPERIOR MARGINS Left 11/18/2018   Procedure: RE-EXCISION OF LEFT BREAST MARGIN;  Surgeon: [name], MD;  Location: [facility];  Service: General;  Laterality: Left;   ROBOTIC ASSISTED SALPINGO OOPHERECTOMY Bilateral 02/18/2019   Procedure: XI ROBOTIC ASSISTED SALPINGO OOPHORECTOMY;  Surgeon: Brien Few, MD;  Location: [facility];  Service: Gynecology;  Laterality: Bilateral;  Requests 1 hr. RNFA Tracie confirmed on 02/13/19 CS     OB History     Gravida  3   Para  2   Term  2   Preterm      AB      Living  2      SAB      IAB      Ectopic      Multiple  0   Live Births  2           Family History  Problem Relation Age of Onset   Arthritis Mother    Migraines Mother    ADD / ADHD Mother    Varicose Veins Mother    Hypertension Father    Alcohol abuse Brother    Varicose Veins Brother    Stroke Maternal Grandmother    Melanoma Maternal Grandmother    Pulmonary fibrosis Maternal Grandfather    Mitral valve prolapse Paternal Grandmother    Migraines Paternal Grandmother    Lymphoma Paternal Grandfather        was treated for this and did ok   Heart failure Paternal Grandfather    Eczema Son    Colon cancer Maternal Aunt 53    Social History   Tobacco Use   Smoking status: Never   Smokeless tobacco: Never  Vaping Use   Vaping Use: Never used  Substance Use Topics   Alcohol use: Yes    Alcohol/week: 2.0 - 3.0 standard drinks    Types: 2 - 3 Glasses of wine per week   Drug use: No    Home Medications Prior to Admission medications   Medication Sig Start Date End Date Taking? Authorizing Provider  ibuprofen (ADVIL) 200 MG tablet Take 400-600 mg by mouth every 6 (six) hours as needed for fever, headache or mild pain.    [provider]  LORazepam (ATIVAN) 1 MG tablet Take 1 tablet (1 mg  total) by mouth every 8 (eight) hours as needed for anxiety (or nausea). 08/12/20   Georgette Shell, MD  ondansetron (ZOFRAN) 8 MG tablet Take 1 tablet (8 mg total) by mouth every 8 (eight) hours as needed for nausea or vomiting. 08/16/20   [name], MD  oxyCODONE (ROXICODONE) 5 MG immediate release tablet Take 1 tablet (5 mg total) by mouth every 4 (four) hours as needed. 08/12/20 09/11/20  Georgette Shell, MD  traZODone (DESYREL) 50 MG tablet Take 0.5-1 tablets (25-50 mg total) by mouth at bedtime as needed for sleep. 08/04/20   Maximiano Coss, NP    Allergies    Patient has no known allergies.  Review of Systems   Review of Systems  Constitutional:  Positive for activity change and fatigue. Negative for  chills and fever.  HENT:  Negative for ear pain and sore throat.   Eyes:  Negative for pain and visual disturbance.  Respiratory:  Negative for cough and shortness of breath.   Cardiovascular:  Negative for chest pain and palpitations.  Gastrointestinal:  Positive for abdominal distention and nausea. Negative for abdominal pain, diarrhea and vomiting.  Genitourinary:  Negative for dysuria and hematuria.  Musculoskeletal:  Negative for arthralgias and back pain.  Skin:  Positive for color change. Negative for rash.  Neurological:  Negative for seizures and syncope.  All other systems reviewed and are negative.  Physical Exam Updated Vital Signs BP 108/80   Pulse (!) 107   Temp (!) 97.5 F (36.4 C) (Oral)   Resp 14   LMP 05/21/2018   SpO2 96%   Physical Exam Vitals and nursing note reviewed. Exam conducted with a chaperone present.  Constitutional:      Appearance: Normal appearance. She is ill-appearing.     Comments: Patient is jaundiced, confused asking for ice chips and does not appear aware of where she is.  HENT:     Head: Normocephalic and atraumatic.  Eyes:     General: No scleral icterus.       Right eye: No discharge.        Left eye: No discharge.      Extraocular Movements: Extraocular movements intact.     Pupils: Pupils are equal, round, and reactive to light.     Comments: Scleral icterus  Cardiovascular:     Rate and Rhythm: Regular rhythm. Tachycardia present.     Pulses: Normal pulses.     Heart sounds: Normal heart sounds. No murmur heard.   No friction rub. No gallop.  Pulmonary:     Effort: Pulmonary effort is normal. No respiratory distress.     Breath sounds: Normal breath sounds.  Abdominal:     General: Abdomen is flat. Bowel sounds are normal. There is distension.     Palpations: Abdomen is soft.     Tenderness: There is no abdominal tenderness.  Skin:    General: Skin is warm and dry.     Coloration: Skin is jaundiced.  Neurological:     Mental Status: She is alert. Mental status is at baseline. She is disoriented.     Coordination: Coordination normal.    ED Results / Procedures / Treatments   Labs (all labs ordered are listed, but only abnormal results are displayed) Labs Reviewed  LACTIC ACID, PLASMA - Abnormal; Notable for the following components:      Result Value   Lactic Acid, Venous 4.6 (*)    All other components within normal limits  CBC WITH DIFFERENTIAL/PLATELET - Abnormal; Notable for the following components:   WBC 2.3 (*)    RBC 3.52 (*)    MCV 111.1 (*)    MCH 38.4 (*)    RDW 17.2 (*)    Platelets 107 (*)    Lymphs Abs 0.1 (*)    Monocytes Absolute 0.0 (*)    All other components within normal limits  COMPREHENSIVE METABOLIC PANEL - Abnormal; Notable for the following components:   Sodium 129 (*)    Chloride 96 (*)    CO2 16 (*)    Glucose, Bld 103 (*)    BUN 40 (*)    Creatinine, Ser 4.18 (*)    Calcium 8.1 (*)    Total Protein 6.2 (*)    Albumin 2.2 (*)    AST 512 (*)  ALT 130 (*)    Alkaline Phosphatase 337 (*)    Total Bilirubin 30.9 (*)    GFR, Estimated 13 (*)    Anion gap 17 (*)    All other components within normal limits  BLOOD GAS, VENOUS - Abnormal; Notable for  the following components:   pCO2, Ven 26.4 (*)    pO2, Ven 53.4 (*)    Bicarbonate 12.1 (*)    Acid-base deficit 13.0 (*)    All other components within normal limits  I-STAT BETA HCG BLOOD, ED ([facility], [facility], [facility] ONLY) - Abnormal; Notable for the following components:   I-stat hCG, quantitative 6.6 (*)    All other components within normal limits  RESP PANEL BY RT-PCR (FLU A&B, COVID) ARPGX2  CULTURE, BLOOD (ROUTINE X 2)  CULTURE, BLOOD (ROUTINE X 2)  URINE CULTURE  LACTIC ACID, PLASMA  URINALYSIS, ROUTINE W REFLEX MICROSCOPIC  PROTIME-INR  APTT  AMMONIA    EKG None  Radiology CT ABDOMEN PELVIS WO CONTRAST  Result Date: 08/25/2020 CLINICAL DATA:  Metastatic breast carcinoma. Jaundice. Worsening abdominal distension. EXAM: CT ABDOMEN AND PELVIS WITHOUT CONTRAST TECHNIQUE: Multidetector CT imaging of the abdomen and pelvis was performed following the standard protocol without IV contrast. COMPARISON:  08/10/2020 FINDINGS: Lower chest: New mild bibasilar atelectasis. New tiny bilateral pleural effusions. Hepatobiliary: Hepatomegaly remains stable. Diffuse hepatic metastatic disease shown on recent MRI is not well visualized on this unenhanced exam. Prior cholecystectomy. No evidence of biliary obstruction. Mild to moderate ascites shows mild increase since prior study. Pancreas: Mild peripancreatic soft tissue stranding and diffuse mesenteric edema are increased since previous study. This could be due to mild pancreatitis or generalized 3rd spacing. Spleen:  Within normal limits in size. Adrenals/Urinary tract: No evidence of urolithiasis or hydronephrosis. Unremarkable unopacified urinary bladder. Stomach/Bowel: Increased mild diffuse colonic wall thickening, which could be due to colitis or hypoalbuminemia. No evidence of obstruction, focal inflammatory process, or abscess. Vascular/Lymphatic: No pathologically enlarged lymph nodes identified. No evidence of abdominal aortic aneurysm. Reproductive:   No mass or other significant abnormality. Other:  None. Musculoskeletal: Several sclerotic bone metastases in the lumbosacral spine and pelvis remains stable. IMPRESSION: Stable hepatomegaly. Diffuse hepatic metastatic disease shown on recent MRI is not well visualized on this unenhanced exam. Stable sclerotic bone metastases. Increased mild to moderate ascites. Increased diffuse mesenteric edema and peripancreatic soft tissue stranding, which could be due to mild pancreatitis or generalized 3rd spacing. Increased mild diffuse colonic wall thickening, which could be due to colitis or hypoalbuminemia. New tiny bilateral pleural effusions and mild bibasilar atelectasis. Electronically Signed   By: Marlaine Hind M.D.   On: 08/13/2020 10:58   DG Chest Port 1 View  Result Date: 08/28/2020 CLINICAL DATA:  Question sepsis EXAM: PORTABLE CHEST 1 VIEW COMPARISON:  05/27/2018 FINDINGS: Hypoventilation with decreased lung volume compared to the prior study. Mild right lower lobe atelectasis. Negative for heart failure, or pneumonia. No pleural effusion. Port-A-Cath has been removed since the prior study. IMPRESSION: Hypoventilation with decreased lung volumes. Electronically Signed   By: Franchot Gallo M.D.   On: 08/11/2020 10:21    Procedures .Critical Care  Date/Time: 08/11/2020 10:25 AM Performed by: Sherrill Raring, PA-C Authorized by: Sherrill Raring, PA-C   Critical care provider statement:    Critical care time (minutes):  45   Critical care was time spent personally by me on the following activities:  Discussions with consultants, evaluation of patient's response to treatment, examination of patient, ordering and performing treatments and  interventions, ordering and review of laboratory studies, ordering and review of radiographic studies, pulse oximetry, re-evaluation of patient's condition, obtaining history from patient or surrogate and review of old charts   Medications Ordered in ED Medications  ceFEPIme  (MAXIPIME) 2 g in sodium chloride 0.9 % 100 mL IVPB (has no administration in time range)  lactated ringers infusion ( Intravenous New Bag/Given 08/04/2020 1252)  ondansetron (ZOFRAN) tablet 4 mg (has no administration in time range)    Or  ondansetron (ZOFRAN) injection 4 mg (has no administration in time range)  morphine 2 MG/ML injection 2 mg (has no administration in time range)  sodium bicarbonate 150 mEq in dextrose 5 % 1,150 mL infusion (has no administration in time range)  metroNIDAZOLE (FLAGYL) IVPB 500 mg (has no administration in time range)  ceFEPIme (MAXIPIME) 2 g in sodium chloride 0.9 % 100 mL IVPB (0 g Intravenous Stopped 08/28/2020 1238)  metroNIDAZOLE (FLAGYL) IVPB 500 mg (500 mg Intravenous New Bag/Given 08/13/2020 1142)  sodium chloride 0.9 % bolus 1,000 mL (1,000 mLs Intravenous New Bag/Given 08/28/2020 1154)  oxyCODONE (Oxy IR/ROXICODONE) immediate release tablet 5 mg (5 mg Oral Given 08/05/2020 1142)    ED Course  I have reviewed the triage vital signs and the nursing notes.  Pertinent labs & imaging results that were available during my care of the patient were reviewed by me and considered in my medical decision making (see chart for details).  Clinical Course as of 08/22/2020 1334  Sat Aug 21, 2020  1007 I-stat hCG, quantitative(!): 6.6 Doubt ectopic pregnancy. Not high enough for concern.  [HS]  1016 In the picture of liver mets and abdominal distension I made the decision to use contrast in her abdominal CT before the CMP resulted thinking the benefit outweighed risk. Most recent creatinine was 0.73 (5 days ago). However, creatinine today came back elevated to 4.18. She has kidney failure.  [HS]  1019 Sodium(!): 129 Giving patient normal saline. I do not expect to correct this here in the ED and do not want to correct to quickly. Giving 1 L.  [HS]    Clinical Course User Index [HS] Sherrill Raring, PA-C   MDM Rules/Calculators/A&P                          Patient is a  40 year old female with history of breast cancer to liver mets.  She is presenting with abdominal distention, confusion.  Her abdomen is distended on exam.  She is obviously confused and severely jaundiced.  I expect this patient will need to be admitted for liver mets/confusion/ascites.  I have initiated septic work-up and ordered a CT abdomen.  We will continue to reevaluate.  I spoke with Dr. Therisa Doyne from [facility].  She states that her abnormal LFTs and jaundice are due to the liver metastasis.  There were no new acute pathologies for her to work-up, and she thinks that this is an oncology matter.   Spoke with Dr. Alen Blew with oncology. He will follow patient but do not think they need to see him. I also spoke with Dr. Chase Caller from critical care who does not feel his consult would beneficial.   I spoke with the patient and her husband about what their goals are.  They primarily just want to keep her comfortable, and are not ready discussed DNR scenario at this point.  This all happened very suddenly a week ago and feeling overwhelmed, although this outcome is not  unexpected. Dr. Olevia Bowens will admit the patient.   Discussed HPI, physical exam and plan of care for this patient with attending J. Zammit. The attending physician evaluated this patient as part of a shared visit and agrees with plan of care.   Final Clinical Impression(s) / ED Diagnoses Final diagnoses:  AKI (acute kidney injury) ([place])  Liver metastasis ([place])  Jaundice  Elevated lactic acid level  Elevated bilirubin    Rx / DC Orders ED Discharge Orders     None        Sherrill Raring, [place] 08/29/2020 1335    Milton Ferguson, MD September 22, 2020 1016

## 2020-08-21 NOTE — ED Triage Notes (Addendum)
Pt recommended to come to ED by oncologist d/t increased abdominal swelling, confusion and fatigue. She was hospitalized last week with jaundice. Hx of breast cancer mets to liver. Husband reports she has been unable to keep down pain or nausea medication over the past day.

## 2020-08-21 NOTE — Consult Note (Signed)
Reason for the request:    Breast cancer  HPI: I was asked by Dr. Roderic Palau to evaluate Bethany Hanna for diagnosis of breast cancer.  She is a 40 year old woman with history of breast cancers diagnosed in 2020.  At that time she presented with a T2N1 left breast tumor that was ER, PR positive HER2 negative.  She received neoadjuvant chemotherapy with a Adriamycin, Cytoxan followed by Taxol completed in August 2020.  She received adjuvant radiation therapy after surgical resection.  She started adjuvant antiestrogen therapy in December 2020 with anastrozole and Ibrance.    She developed metastatic disease in 01/2020 with hepatic as well as bone metastasis.  She developed jaundice and increased bilirubin and was hospitalized between June 7 and August 12, 2020.  After her discharge she received gemcitabine salvage chemotherapy under the care of Dr. Lindi Adie on August 18, 2020.    She presented to the emergency department today after symptoms of progressive lethargy, confusion and abdominal distention.  Upon my evaluation in the emergency department, she is lethargic but responsive but not able to give much history.  According to her husband she has had episodic confusion continuous fatigue weakness and failure to thrive.  She has not been able to eat for the last 3 days.  He has not been drinking much at this time.  She did report abdominal distention but no pain at this time.      Past Medical History:  Diagnosis Date   Anxiety    SITUational -uses xanax during the day, reports the ativan is for bedtime   Elevated blood pressure reading in office with white coat syndrome, without diagnosis of hypertension    Family history of colon cancer    Family history of melanoma    GERD (gastroesophageal reflux disease)    History of cancer chemotherapy    left breast cancer--- 05-28-2018 to 10-08-2018   History of migraine    Hypertension    Malignant neoplasm of upper-outer quadrant of left breast in female,  estrogen receptor positive [facility]) oncologist--- dr Lindi Adie   dx 05-13-2018, clinical stage IIB, grade 3, invasive mammary carcinoma (ER/PR +, HER2 negative);  completed chemotherapy 10-08-2018;  11-05-2018 s/p left breast lumpectomy w/ node dissection's and Re-excision 11-18-2018;  started radiation therapy 12-30-2018 to complete 02-14-2019   PONV (postoperative nausea and vomiting)    Radiation burn    02-13-2019  per pt left breast , current radiation treatment  :   Past Surgical History:  Procedure Laterality Date   BREAST LUMPECTOMY WITH RADIOACTIVE SEED AND AXILLARY LYMPH NODE DISSECTION Left 11/05/2018   Procedure: LEFT BREAST LUMPECTOMY WITH BRACKETED RADIOACTIVE SEED AND LEFT AXILLARY LYMPH NODE DISSECTION;  Surgeon: [name], MD;  Location: [place];  Service: General;  Laterality: Left;   CHOLECYSTECTOMY N/A 04/09/2015   Procedure: LAPAROSCOPIC CHOLECYSTECTOMY WITH INTRAOPERATIVE CHOLANGIOGRAM;  Surgeon: Autumn Messing III, MD;  Location: [facility];  Service: General;  Laterality: N/A;   DILATATION & CURRETTAGE/HYSTEROSCOPY WITH RESECTOCOPE N/A 07/31/2013   Procedure: [place];  Surgeon: Princess Bruins, MD;  Location: [place] [facility];  Service: Gynecology;  Laterality: N/A;  1 hr.   DILATION AND EVACUATION N/A 12/12/2017   Procedure: DILATATION AND EVACUATION (D&E) 2ND TRIMESTER;  Surgeon: Brien Few, MD;  Location: [place] [facility];  Service: Gynecology;  Laterality: N/A;   OPERATIVE ULTRASOUND N/A 12/12/2017   Procedure: OPERATIVE ULTRASOUND;  Surgeon: Brien Few, MD;  Location: [place] [facility];  Service: Gynecology;  Laterality: N/A;   PORT-A-CATH REMOVAL Right  11/05/2018   Procedure: REMOVAL PORT-A-CATH;  Surgeon: [name], MD;  Location: [place];  Service: General;  Laterality: Right;   PORTACATH PLACEMENT Right 05/27/2018   Procedure: INSERTION PORT-A-CATH WITH ULTRASOUND;  Surgeon: [name], MD;  Location: [place];  Service:  General;  Laterality: Right;  GENERAL AND LMA   RE-EXCISION OF BREAST CANCER,SUPERIOR MARGINS Left 11/18/2018   Procedure: RE-EXCISION OF LEFT BREAST MARGIN;  Surgeon: [name], MD;  Location: [place];  Service: General;  Laterality: Left;   ROBOTIC ASSISTED SALPINGO OOPHERECTOMY Bilateral 02/18/2019   Procedure: XI ROBOTIC ASSISTED SALPINGO OOPHORECTOMY;  Surgeon: Brien Few, MD;  Location: [place];  Service: Gynecology;  Laterality: Bilateral;  Requests 1 hr. RNFA Tracie confirmed on 02/13/19 CS  :   Current Facility-Administered Medications:    ceFEPIme (MAXIPIME) 2 g in sodium chloride 0.9 % 100 mL IVPB, 2 g, Intravenous, Q8H, Wofford, Drew A, RPH   lactated ringers infusion, , Intravenous, Continuous, Sherrill Raring, PA-C, Last Rate: 150 mL/hr at 08/22/2020 0959, New Bag at 08/11/2020 2500   lactated ringers infusion, , Intravenous, Continuous, Sage, Haley, PA-C   metroNIDAZOLE (FLAGYL) IVPB 500 mg, 500 mg, Intravenous, Once, Sage, Haley, PA-C   oxyCODONE (Oxy IR/ROXICODONE) immediate release tablet 5 mg, 5 mg, Oral, Once, Sage, Haley, PA-C   sodium chloride 0.9 % bolus 1,000 mL, 1,000 mL, Intravenous, Once, Sherrill Raring, PA-C  Current Outpatient Medications:    ibuprofen (ADVIL) 200 MG tablet, Take 400-600 mg by mouth every 6 (six) hours as needed for fever, headache or mild pain., Disp: , Rfl:    LORazepam (ATIVAN) 1 MG tablet, Take 1 tablet (1 mg total) by mouth every 8 (eight) hours as needed for anxiety (or nausea)., Disp: 60 tablet, Rfl: 0   ondansetron (ZOFRAN) 8 MG tablet, Take 1 tablet (8 mg total) by mouth every 8 (eight) hours as needed for nausea or vomiting., Disp: 20 tablet, Rfl: 0   oxyCODONE (ROXICODONE) 5 MG immediate release tablet, Take 1 tablet (5 mg total) by mouth every 4 (four) hours as needed., Disp: 30 tablet, Rfl: 0   traZODone (DESYREL) 50 MG tablet, Take 0.5-1 tablets (25-50 mg total) by mouth at bedtime as needed for sleep., Disp: 30  tablet, Rfl: 3:  No Known Allergies:   Family History  Problem Relation Age of Onset   Arthritis Mother    Migraines Mother    ADD / ADHD Mother    Varicose Veins Mother    Hypertension Father    Alcohol abuse Brother    Varicose Veins Brother    Stroke Maternal Grandmother    Melanoma Maternal Grandmother    Pulmonary fibrosis Maternal Grandfather    Mitral valve prolapse Paternal Grandmother    Migraines Paternal Grandmother    Lymphoma Paternal Grandfather        was treated for this and did ok   Heart failure Paternal Grandfather    Eczema Son    Colon cancer Maternal Aunt 53  :   Social History   Socioeconomic History   Marital status: Married    Spouse name: Not on file   Number of children: Not on file   Years of education: Not on file   Highest education level: Not on file  Occupational History   Not on file  Tobacco Use   Smoking status: Never   Smokeless tobacco: Never  Vaping Use   Vaping Use: Never used  Substance and Sexual Activity   Alcohol  use: Yes    Alcohol/week: 2.0 - 3.0 standard drinks    Types: 2 - 3 Glasses of wine per week   Drug use: No   Sexual activity: Yes  Other Topics Concern   Not on file  Social History Narrative   Married- see husband Catalina [place]igua- have 73 and 28 year old in 2020.       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  Exam: Blood pressure 107/60, pulse (!) 110, temperature (!) 97.5 F (36.4 C), temperature source Oral, resp. rate 15, last menstrual period 05/21/2018, SpO2 96 %.   General appearance:  Ill-appearing woman appeared in mild distress. Head: atraumatic without any abnormalities. Eyes: conjunctivae/corneas clear. PERRL.  Scleral icterus noted. Throat: lips, mucosa, and tongue normal; without oral thrush or ulcers. Resp: clear to auscultation bilaterally without rhonchi, wheezes or dullness to percussion. Cardio: regular rate and rhythm, S1, S2 normal, no murmur, click, rub or gallop GI: Distended with stage shifting dullness. Skin: Skin color, texture, turgor normal. No rashes or lesions Lymph nodes: Cervical, supraclavicular, and axillary nodes normal. Neurologic: Grossly normal without any motor, sensory or deep tendon reflexes. Musculoskeletal: No joint deformity or effusion.   Recent Labs    08/20/2020 0934  WBC 2.3*  HGB 13.5  HCT 39.1  PLT 107*    Recent Labs    08/20/2020 0934  NA 129*  K 4.1  CL 96*  CO2 16*  GLUCOSE 103*  BUN 40*  CREATININE 4.18*  CALCIUM 8.1*      CT ABDOMEN PELVIS WO CONTRAST  Result Date: 09/01/2020 CLINICAL DATA:  Metastatic breast carcinoma. Jaundice. Worsening abdominal distension. EXAM: CT ABDOMEN AND PELVIS WITHOUT CONTRAST TECHNIQUE: Multidetector CT imaging of the abdomen and pelvis was performed following the standard protocol without IV contrast. COMPARISON:  08/10/2020 FINDINGS: Lower chest: New mild bibasilar atelectasis. New tiny bilateral pleural effusions. Hepatobiliary: Hepatomegaly remains stable. Diffuse hepatic metastatic disease shown on recent MRI is not well visualized on this unenhanced exam. Prior cholecystectomy. No evidence of biliary obstruction. Mild to moderate ascites shows mild increase since prior study. Pancreas: Mild peripancreatic soft tissue stranding and diffuse mesenteric edema are increased since previous study. This could be due to mild pancreatitis or generalized 3rd spacing. Spleen:  Within normal limits in size. Adrenals/Urinary tract: No evidence of urolithiasis or hydronephrosis. Unremarkable  unopacified urinary bladder. Stomach/Bowel: Increased mild diffuse colonic wall thickening, which could be due to colitis or hypoalbuminemia. No evidence of obstruction, focal inflammatory process, or abscess. Vascular/Lymphatic: No pathologically enlarged lymph nodes identified. No evidence of abdominal aortic aneurysm. Reproductive:  No mass or other significant abnormality. Other:  None. Musculoskeletal: Several sclerotic bone metastases in the lumbosacral spine and pelvis remains stable. IMPRESSION: Stable hepatomegaly. Diffuse hepatic metastatic disease shown on recent MRI is not well visualized on this unenhanced exam. Stable sclerotic bone metastases. Increased mild to moderate ascites. Increased diffuse mesenteric edema and peripancreatic soft tissue stranding, which could be due to mild pancreatitis or generalized 3rd spacing. Increased mild diffuse colonic wall thickening, which could  be due to colitis or hypoalbuminemia. New tiny bilateral pleural effusions and mild bibasilar atelectasis. Electronically Signed   By: Marlaine Hind M.D.   On: 08/14/2020 10:58   Assessment and Plan:    40 year old woman with:  1.  Metastatic Breast cancer with hepatic and bone involvement documented in November 2021.  She was treated with Leslee Home and letrozole and Faslodex was added in November 2021.  She subsequently developed progressive metastatic disease in June 2022 and started on gemcitabine and received one treatment on August 18, 2020.  Disease status was updated today and discussed with the patient and her husband.  She is not able to give much history or insight but her husband is fully aware of her diagnosis and treatment.  Her prognosis is very poor given her rapidly progressive disease as well as multiorgan failure including hyperbilirubinemia and acute renal failure.  Based on these findings, I recommend supportive management only and transitioning to comfort care.  Having palliative medicine  involvement to direct goals of care would be helpful.  I advised the husband against any aggressive cardiopulmonary resuscitation measures.  2.  Jaundice: Her bilirubin is up to 30.9.  This is likely related to diffuse hepatic metastasis rather than a specific biliary obstruction.  I do not believe any GI intervention would have any meaningful recovery at this time.  3.  Acute renal failure: Mostly related to dehydration with increased BUN.  Hepatorenal syndrome related to her liver failure could also be contributing.  4.  Prognosis and goals of care: This was discussed with the husband today.  Her prognosis is poor with very limited life expectancy and very unlikely to survive this hospitalization.   We will continue to follow during this hospitalization.   80  minutes were dedicated to this visit.  50% of the time was dedicated to face-to-face with the patient and her husband.  The time was spent on reviewing laboratory data, imaging studies, discussing treatment options, and answering questions regarding future plan.      A copy of this consult has been forwarded to the requesting physician.

## 2020-08-21 NOTE — Sepsis Progress Note (Signed)
Notified provider of need to order additional fluid bolus with LA 4.6.

## 2020-08-21 NOTE — Progress Notes (Signed)
PHARMACY NOTE -  Cefepime  Pharmacy has been assisting with dosing of cefepime for intra-abdominal infection. Dosage remains stable at 2g IV q8 hr and further renal adjustments per institutional Pharmacy antibiotic protocol  Pharmacy will sign off, following peripherally for culture results or dose adjustments. Please reconsult if a change in clinical status warrants re-evaluation of dosage.  Reuel Boom, PharmD, BCPS [phone] 09/02/2020, 9:46 AM

## 2020-08-21 NOTE — ED Notes (Signed)
Provider and RN aware of critical lactic 4.6 and total bilirubin 30.9.

## 2020-08-22 DIAGNOSIS — D6959 Other secondary thrombocytopenia: Secondary | ICD-10-CM | POA: Diagnosis present

## 2020-08-22 DIAGNOSIS — D701 Agranulocytosis secondary to cancer chemotherapy: Secondary | ICD-10-CM | POA: Diagnosis present

## 2020-08-22 DIAGNOSIS — I10 Essential (primary) hypertension: Secondary | ICD-10-CM

## 2020-08-22 DIAGNOSIS — C50412 Malignant neoplasm of upper-outer quadrant of left female breast: Secondary | ICD-10-CM | POA: Diagnosis present

## 2020-08-22 DIAGNOSIS — A419 Sepsis, unspecified organism: Principal | ICD-10-CM

## 2020-08-22 DIAGNOSIS — Z20822 Contact with and (suspected) exposure to covid-19: Secondary | ICD-10-CM | POA: Diagnosis present

## 2020-08-22 DIAGNOSIS — Z7189 Other specified counseling: Secondary | ICD-10-CM

## 2020-08-22 DIAGNOSIS — E872 Acidosis: Secondary | ICD-10-CM

## 2020-08-22 DIAGNOSIS — Z170 Estrogen receptor positive status [ER+]: Secondary | ICD-10-CM | POA: Diagnosis not present

## 2020-08-22 DIAGNOSIS — N179 Acute kidney failure, unspecified: Secondary | ICD-10-CM

## 2020-08-22 DIAGNOSIS — R6520 Severe sepsis without septic shock: Secondary | ICD-10-CM | POA: Diagnosis present

## 2020-08-22 DIAGNOSIS — G9341 Metabolic encephalopathy: Secondary | ICD-10-CM | POA: Diagnosis present

## 2020-08-22 DIAGNOSIS — K7291 Hepatic failure, unspecified with coma: Secondary | ICD-10-CM | POA: Diagnosis present

## 2020-08-22 DIAGNOSIS — R188 Other ascites: Secondary | ICD-10-CM | POA: Diagnosis present

## 2020-08-22 DIAGNOSIS — Z6836 Body mass index (BMI) 36.0-36.9, adult: Secondary | ICD-10-CM | POA: Diagnosis not present

## 2020-08-22 DIAGNOSIS — C7951 Secondary malignant neoplasm of bone: Secondary | ICD-10-CM | POA: Diagnosis present

## 2020-08-22 DIAGNOSIS — E875 Hyperkalemia: Secondary | ICD-10-CM

## 2020-08-22 DIAGNOSIS — C787 Secondary malignant neoplasm of liver and intrahepatic bile duct: Secondary | ICD-10-CM | POA: Diagnosis present

## 2020-08-22 DIAGNOSIS — Z515 Encounter for palliative care: Secondary | ICD-10-CM | POA: Diagnosis not present

## 2020-08-22 DIAGNOSIS — Z807 Family history of other malignant neoplasms of lymphoid, hematopoietic and related tissues: Secondary | ICD-10-CM | POA: Diagnosis not present

## 2020-08-22 DIAGNOSIS — R7401 Elevation of levels of liver transaminase levels: Secondary | ICD-10-CM

## 2020-08-22 DIAGNOSIS — R7989 Other specified abnormal findings of blood chemistry: Secondary | ICD-10-CM

## 2020-08-22 DIAGNOSIS — J90 Pleural effusion, not elsewhere classified: Secondary | ICD-10-CM | POA: Diagnosis present

## 2020-08-22 DIAGNOSIS — E871 Hypo-osmolality and hyponatremia: Secondary | ICD-10-CM | POA: Diagnosis present

## 2020-08-22 DIAGNOSIS — K831 Obstruction of bile duct: Secondary | ICD-10-CM | POA: Diagnosis present

## 2020-08-22 DIAGNOSIS — Z66 Do not resuscitate: Secondary | ICD-10-CM | POA: Diagnosis present

## 2020-08-22 DIAGNOSIS — R17 Unspecified jaundice: Secondary | ICD-10-CM | POA: Diagnosis not present

## 2020-08-22 DIAGNOSIS — E669 Obesity, unspecified: Secondary | ICD-10-CM | POA: Diagnosis present

## 2020-08-22 DIAGNOSIS — D689 Coagulation defect, unspecified: Secondary | ICD-10-CM | POA: Diagnosis present

## 2020-08-22 DIAGNOSIS — Z800 Family history of malignant neoplasm of digestive organs: Secondary | ICD-10-CM | POA: Diagnosis not present

## 2020-08-22 DIAGNOSIS — D696 Thrombocytopenia, unspecified: Secondary | ICD-10-CM

## 2020-08-22 MED ORDER — GLYCOPYRROLATE 0.2 MG/ML IJ SOLN
0.2000 mg | INTRAMUSCULAR | Status: DC | PRN
  Filled 2020-08-22: qty 1

## 2020-08-22 MED ORDER — POLYVINYL ALCOHOL 1.4 % OP SOLN
1.0000 [drp] | Freq: Four times a day (QID) | OPHTHALMIC | Status: DC | PRN
  Filled 2020-08-22: qty 15

## 2020-08-22 MED ORDER — MORPHINE SULFATE (PF) 2 MG/ML IV SOLN: 2.0000 mg | INTRAVENOUS | Status: DC | PRN

## 2020-08-22 MED ORDER — DIPHENHYDRAMINE HCL 50 MG/ML IJ SOLN: 25.0000 mg | Freq: Four times a day (QID) | INTRAMUSCULAR | Status: DC | PRN

## 2020-08-22 MED ORDER — HALOPERIDOL LACTATE 2 MG/ML PO CONC
0.5000 mg | ORAL | Status: DC | PRN
  Filled 2020-08-22: qty 0.3

## 2020-08-22 MED ORDER — HALOPERIDOL 0.5 MG PO TABS
0.5000 mg | ORAL_TABLET | ORAL | Status: DC | PRN
  Filled 2020-08-22: qty 1

## 2020-08-22 MED ORDER — LORAZEPAM 2 MG/ML IJ SOLN: 0.5000 mg | INTRAMUSCULAR | Status: DC | PRN

## 2020-08-22 MED ORDER — HYDROMORPHONE HCL 1 MG/ML IJ SOLN
1.0000 mg | INTRAMUSCULAR | Status: DC | PRN
  Administered 2020-08-22 – 2020-08-23 (×7): 1 mg via INTRAVENOUS
  Filled 2020-08-22 (×7): qty 1

## 2020-08-22 MED ORDER — LORAZEPAM 2 MG/ML IJ SOLN
0.5000 mg | Freq: Four times a day (QID) | INTRAMUSCULAR | Status: DC | PRN
  Administered 2020-08-22: 0.5 mg via INTRAVENOUS
  Filled 2020-08-22: qty 1

## 2020-08-22 MED ORDER — LIP MEDEX EX OINT
TOPICAL_OINTMENT | CUTANEOUS | Status: DC | PRN
  Filled 2020-08-22: qty 7

## 2020-08-22 MED ORDER — GLYCOPYRROLATE 1 MG PO TABS
1.0000 mg | ORAL_TABLET | ORAL | Status: DC | PRN
  Filled 2020-08-22: qty 1

## 2020-08-22 MED ORDER — BIOTENE DRY MOUTH MT LIQD: 15.0000 mL | OROMUCOSAL | Status: DC | PRN

## 2020-08-22 MED ORDER — SODIUM CHLORIDE 0.9 % IV SOLN: INTRAVENOUS | Status: DC

## 2020-08-22 MED ORDER — HALOPERIDOL LACTATE 5 MG/ML IJ SOLN: 1.0000 mg | INTRAMUSCULAR | Status: DC | PRN

## 2020-08-22 NOTE — Progress Notes (Signed)
IP PROGRESS NOTE  Subjective:   Bethany Hanna continues to be minimally responsive at this time but did receive morphine while she is in the emergency department.  She is comfortable at this time without any agitation or or pain.  Objective:  Vital signs in last 24 hours: Temp:  [97.5 F (36.4 C)-98.8 F (37.1 C)] 98.6 F (37 C) (06/19 0525) Pulse Rate:  [96-124] 99 (06/19 0549) Resp:  [14-24] 14 (06/19 0525) BP: (104-130)/(60-93) 119/80 (06/19 0525) SpO2:  [93 %-99 %] 98 % (06/19 0525) Weight:  [239 lb 10.2 oz (108.7 kg)] 239 lb 10.2 oz (108.7 kg) (06/18 1930) Weight change:  Last BM Date: 08/28/2020  Intake/Output from previous day: 06/18 0701 - 06/19 0700 In: 1974.2 [I.V.:1774.2; IV Piggyback:200] Out: 200 [Urine:200] General: Minimally responsive not in distress. Head: Normocephalic atraumatic. Mouth: mucous membranes moist, pharynx normal without lesions Eyes: No scleral icterus.  Pupils are equal and round reactive to light.  Scleral icterus. Resp: clear to auscultation bilaterally without rhonchi or wheezes or dullness to percussion. Cardio: regular rate and rhythm, S1, S2 normal, no murmur, click, rub or gallop GI: soft, non-tender; bowel sounds normal; no masses,  no organomegaly Musculoskeletal: No joint deformity or effusion. Neurological: No motor, sensory deficits.  Intact deep tendon reflexes. Skin: Jaundice   Lab Results: Recent Labs    08/20/2020 0934  WBC 2.3*  HGB 13.5  HCT 39.1  PLT 107*    BMET Recent Labs    08/09/2020 1800 08/27/2020 2257  NA 128* 129*  K 4.1 5.2*  CL 97* 99  CO2 16* 17*  GLUCOSE 131* 117*  BUN 42* 47*  CREATININE 3.61* 3.72*  CALCIUM 7.2* 7.4*    Studies/Results: CT ABDOMEN PELVIS WO CONTRAST  Result Date: 08/18/2020 CLINICAL DATA:  Metastatic breast carcinoma. Jaundice. Worsening abdominal distension. EXAM: CT ABDOMEN AND PELVIS WITHOUT CONTRAST TECHNIQUE: Multidetector CT imaging of the abdomen and pelvis was performed  following the standard protocol without IV contrast. COMPARISON:  08/10/2020 FINDINGS: Lower chest: New mild bibasilar atelectasis. New tiny bilateral pleural effusions. Hepatobiliary: Hepatomegaly remains stable. Diffuse hepatic metastatic disease shown on recent MRI is not well visualized on this unenhanced exam. Prior cholecystectomy. No evidence of biliary obstruction. Mild to moderate ascites shows mild increase since prior study. Pancreas: Mild peripancreatic soft tissue stranding and diffuse mesenteric edema are increased since previous study. This could be due to mild pancreatitis or generalized 3rd spacing. Spleen:  Within normal limits in size. Adrenals/Urinary tract: No evidence of urolithiasis or hydronephrosis. Unremarkable unopacified urinary bladder. Stomach/Bowel: Increased mild diffuse colonic wall thickening, which could be due to colitis or hypoalbuminemia. No evidence of obstruction, focal inflammatory process, or abscess. Vascular/Lymphatic: No pathologically enlarged lymph nodes identified. No evidence of abdominal aortic aneurysm. Reproductive:  No mass or other significant abnormality. Other:  None. Musculoskeletal: Several sclerotic bone metastases in the lumbosacral spine and pelvis remains stable. IMPRESSION: Stable hepatomegaly. Diffuse hepatic metastatic disease shown on recent MRI is not well visualized on this unenhanced exam. Stable sclerotic bone metastases. Increased mild to moderate ascites. Increased diffuse mesenteric edema and peripancreatic soft tissue stranding, which could be due to mild pancreatitis or generalized 3rd spacing. Increased mild diffuse colonic wall thickening, which could be due to colitis or hypoalbuminemia. New tiny bilateral pleural effusions and mild bibasilar atelectasis. Electronically Signed   By: Marlaine Hind M.D.   On: 08/11/2020 10:58   DG Chest Port 1 View  Result Date: 08/05/2020 CLINICAL DATA:  Question sepsis EXAM: PORTABLE  CHEST 1 VIEW  COMPARISON:  05/27/2018 FINDINGS: Hypoventilation with decreased lung volume compared to the prior study. Mild right lower lobe atelectasis. Negative for heart failure, or pneumonia. No pleural effusion. Port-A-Cath has been removed since the prior study. IMPRESSION: Hypoventilation with decreased lung volumes. Electronically Signed   By: Franchot Gallo M.D.   On: 08/20/2020 10:21    Medications: I have reviewed the patient's current medications.  Assessment/Plan:  40 year old woman with:  1.  Stage IV breast cancer with hepatic and bone disease approaching end-stage status.    The natural course of her disease was discussed can with her husband and her father that is present today bedside.  Given her multiorgan failure, progressive hepatic metastasis additional chemotherapy not only is not feasible, it would likely be futile.  Supportive care and treating reversible issues and concentrating on comfort would be the goal moving forward.   2.  Hepatic failure: Related to metastatic disease to the liver.  No improvement noted at this time.   3.  Acute renal failure: Prerenal in etiology with slight improvement with hydration but no meaningful recovery.   4.  Prognosis and goals of care: This was reiterated again in detail with her husband and her father.  I expect limited life expectancy and likely not to make it through this hospitalization.  Anticipate life expectancy in the matter of days given her multiorgan failure.  The role of transitioning to residential hospice was also discussed.  We also discussed the possibility of her declining so rapidly that she might not need to be moved at this time.  She is currently DNR which certainly the right move moving forward.  Possible transitioning to comfort care would be considered.        35 minutes were dedicated to this visit.  50% of the time was dedicated to face-to-face with the patient and her husband and her father.  The time was spent on  discussing her disease status, treatment choices, and overall prognosis.    LOS: 1 day   Zola Button 08/22/2020, 8:22 AM

## 2020-08-22 NOTE — Consult Note (Signed)
Consultation Note Date: 08/22/2020   Patient Name: Bethany Hanna  DOB: 1980/06/25  MRN: [id number]  Age / Sex: 40 y.o., female  PCP: Bethany Olp, MD Referring Physician: Mercy Riding, MD  Reason for Consultation: Terminal Care  HPI/Patient Profile: 40 y.o. female  with past medical history of metastatic breast cancer with mets to liver and bone, hypertension, anxiety, recent hospitalization with jaundice and elevated liver enzymes with hyperbilirubinemia admitted on 08/25/2020 with multiorgan failure.  She was evaluated by oncology as well as primary hospitalist team and recommendation moving forward is for focus on comfort.  Palliative consulted for support and symptom management needs  Clinical Assessment and Goals of Care: Chart reviewed including personal review of pertinent labs and imaging.  I met with patient's husband, Bethany Hanna, and he reports understanding that Bethany Hanna is dying.  He wants to ensure that she is as comfortable as possible.  We discussed end-of-life care and symptom management needs.  Provided anticipatory guidance on signs and symptoms of distress in unresponsive dying individuals.  They have 2 young children and we discussed concerns about doing our best to ensure they get as much support as possible.   SUMMARY OF RECOMMENDATIONS   -Full comfort care -Due to significant renal and liver impairment, I did transition from morphine to Dilaudid.  While this will likely not matter due to her short prognosis, there would be a higher likelihood of toxic metabolite buildup with continued morphine usage versus Dilaudid.  While this is generally not a concern in imminently dying patients, she is very young and may have more reserve which would lead to higher chance of this potentially becoming an issue. -Continue to monitor clinical course.  I believe there is a high likelihood this will be a  hospital death.  I did mention residential hospice to husband but he reports wanting to focus on spending time with her and reassessing based upon how quickly she has declining over the next 48 hours.  Code Status/Advance Care Planning: DNR Psycho-social/Spiritual:  Desire for further Chaplaincy support:yes Additional Recommendations: Grief/Bereavement Support  Prognosis:  Hours - Days  Discharge Planning: Anticipated Hospital Death      Primary Diagnoses: Present on Admission:  Severe sepsis with acute organ dysfunction (Bethany Hanna)  Thrombocytopenia (Bethany Hanna)  Metastatic breast cancer (Bethany Hanna)  Hyperbilirubinemia  Transaminitis  Hypertension  AKI (acute kidney injury) (Bethany Hanna)   I have reviewed the medical record, interviewed the patient and family, and examined the patient. The following aspects are pertinent.  Past Medical History:  Diagnosis Date   Anxiety    SITUational -uses xanax during the day, reports the ativan is for bedtime   Elevated blood pressure reading in office with white coat syndrome, without diagnosis of hypertension    Family history of colon cancer    Family history of melanoma    GERD (gastroesophageal reflux disease)    History of cancer chemotherapy    left breast cancer--- 05-28-2018 to 10-08-2018   History of migraine    Hypertension  Malignant neoplasm of upper-outer quadrant of left breast in female, estrogen receptor positive [facility]) oncologist--- dr Bethany Hanna   dx 05-13-2018, clinical stage IIB, grade 3, invasive mammary carcinoma (ER/PR +, HER2 negative);  completed chemotherapy 10-08-2018;  11-05-2018 s/p left breast lumpectomy w/ node dissection's and Re-excision 11-18-2018;  started radiation therapy 12-30-2018 to complete 02-14-2019   PONV (postoperative nausea and vomiting)    Radiation burn    02-13-2019  per pt left breast , current radiation treatment   Social History   Socioeconomic History   Marital status: Married    Spouse name: Not on file    Number of children: Not on file   Years of education: Not on file   Highest education level: Not on file  Occupational History   Not on file  Tobacco Use   Smoking status: Never   Smokeless tobacco: Never  Vaping Use   Vaping Use: Never used  Substance and Sexual Activity   Alcohol use: Yes    Alcohol/week: 2.0 - 3.0 standard drinks    Types: 2 - 3 Glasses of wine per week   Drug use: No   Sexual activity: Yes  Other Topics Concern   Not on file  Social History Narrative   Married- see husband Bethany Hanna- have 6 and 23 year old in 2020.       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      Hobbies: time with kids, time with girlfriends, some wine and designs   Social Determinants of Health   Financial Resource Strain: Not on file  Food Insecurity: Not on file  Transportation Needs: Not on file  Physical Activity: Not on file  Stress: Not on file  Social Connections: Not on file   Family History  Problem Relation Age of Onset   Arthritis Mother    Migraines Mother    ADD / ADHD Mother    Varicose Veins Mother    Hypertension Father    Alcohol abuse Brother    Varicose Veins Brother    Stroke Maternal Grandmother    Melanoma Maternal Grandmother    Pulmonary fibrosis Maternal Grandfather    Mitral valve prolapse Paternal Grandmother    Migraines Paternal Grandmother    Lymphoma Paternal Grandfather        was treated for this and did ok   Heart failure Paternal Grandfather    Eczema Son    Colon cancer Maternal Aunt 53   Scheduled Meds: Continuous Infusions: PRN Meds:.antiseptic oral rinse, diphenhydrAMINE, glycopyrrolate **OR** glycopyrrolate **OR** glycopyrrolate, haloperidol **OR** haloperidol **OR** haloperidol lactate, HYDROmorphone (DILAUDID) injection, lip balm, LORazepam, ondansetron **OR** ondansetron (ZOFRAN) IV, polyvinyl alcohol Medications Prior to Admission:  Prior to Admission medications   Medication Sig Start  Date End Date Taking? Authorizing Provider  ibuprofen (ADVIL) 200 MG tablet Take 400-600 mg by mouth every 6 (six) hours as needed for fever, headache or mild pain.   Yes [provider]  letrozole (FEMARA) 2.5 MG tablet Take 2.5 mg by mouth daily. 08/12/20  Yes [provider]  LORazepam (ATIVAN) 1 MG tablet Take 1 tablet (1 mg total) by mouth every 8 (eight) hours as needed for anxiety (or nausea). 08/12/20  Yes Georgette Shell, MD  ondansetron (ZOFRAN) 8 MG tablet Take 1 tablet (8 mg total) by mouth every 8 (eight) hours as needed for nausea or vomiting. 08/16/20  Yes [name], MD  oxyCODONE (ROXICODONE) 5 MG immediate release tablet Take 1 tablet (5 mg  total) by mouth every 4 (four) hours as needed. 08/12/20 09/11/20 Yes Georgette Shell, MD  traZODone (DESYREL) 50 MG tablet Take 0.5-1 tablets (25-50 mg total) by mouth at bedtime as needed for sleep. 08/04/20  Yes Maximiano Coss, NP   No Known Allergies Review of Systems Unable to obtain  Physical Exam General: No distress noted Heart: Regular rate and rhythm. No murmur appreciated. Lungs: decreased air movement, clear Abdomen: Soft, nontender, distended, positive bowel sounds.   Ext: + edema Skin: Warm and dry, jaundiced Neuro: somnolent.   Vital Signs: BP 119/80 (BP Location: Right Wrist)   Pulse (!) 101   Temp 98.6 F (37 C) (Oral)   Resp 14   Ht 5' 8"  (1.727 m)   Wt 108.7 kg   LMP 05/21/2018   SpO2 97%   BMI 36.44 kg/m  Pain Scale: PAINAD   Pain Score: Asleep   SpO2: SpO2: 97 % O2 Device:SpO2: 97 % O2 Flow Rate: .O2 Flow Rate (L/min): 2 L/min  IO: Intake/output summary:  Intake/Output Summary (Last 24 hours) at 08/22/2020 2344 Last data filed at 08/22/2020 1700 Gross per 24 hour  Intake 2741.7 ml  Output 350 ml  Net 2391.7 ml    LBM: Last BM Date: 08/29/2020 Baseline Weight: Weight: 108.7 kg Most recent weight: Weight: 108.7 kg     Palliative Assessment/Data:   Flowsheet Rows     Flowsheet Row Most Recent Value  Intake Tab   Referral Department Hospitalist  Unit at Time of Referral Med/Surg Unit  Palliative Care Primary Diagnosis Cancer  Date Notified 08/22/20  Palliative Care Type New Palliative care  Reason for referral End of Life Care Assistance  Date of Admission 08/18/2020  Date first seen by Palliative Care 08/22/20  # of days Palliative referral response time 0 Day(s)  # of days IP prior to Palliative referral 1  Clinical Assessment   Palliative Performance Scale Score 10%  Psychosocial & Spiritual Assessment   Palliative Care Outcomes        Time In: 2595 Time Out: 1545 Time Total: 60 Greater than 50%  of this time was spent counseling and coordinating care related to the above assessment and plan.  Signed by: Micheline Rough, MD   Please contact Palliative Medicine Team phone at [phone] for questions and concerns.  For individual provider: See Shea Evans

## 2020-08-22 NOTE — Progress Notes (Signed)
PROGRESS NOTE  Bethany Hanna KDT:[id number] DOB: 12-31-1980   PCP: [name], MD  Patient is from: Home.  DOA: 08/16/2020 LOS: 1  Chief complaints:  Chief Complaint  Patient presents with   abdominal swelling   Jaundice     Brief Narrative / Interim history: 40 year old F with PMH of metastatic breast cancer with mets to liver and bones, HTN, anxiety and recent hospitalization from 6/7-6/9 for jaundice, elevated liver enzymes and hyperbilirubinemia returning with increased somnolence, lethargy, confusion and abdominal distention, and admitted for multiorgan failure likely from metastatic cancer versus infection.    In ED, she was tachycardic to 120s.  WBC 2.3.  INR 2.3.  Lactic acid 4.6.   Na 129.   Cr 4.18.  BUN 40.  AST 512.  ALT 130.  ALP 337.  Total bili 30.9.  VBG with metabolic acidosis.  CXR with decreased lung volumes.  CT abdomen and pelvis without contrast with mild to moderate ascites, increased diffuse mesenteric edema and peripancreatic soft tissue stranding, mild diffuse colonic wall thickening and new tiny bilateral pleural effusion with mild bibasilar atelectasis.  Cultures obtained.  Started on IV cefepime and IV Flagyl for possible sepsis.  she was started on broad-spectrum antibiotics and IV fluid.  Patient was evaluated by oncology who felt prognosis is poor with very limited life expectancy, and recommended supportive care and transition to comfort care    Subjective: Seen and examined earlier this morning.  Patient is very lethargic and minimally responsive.  Does not appear to be in distress.  Patient's father and husband at bedside.  Husband tearful understandably.  They had a discussion with oncology earlier this morning.  They understand that the prognosis is very poor as her cancer is getting out of hand.  We have agreed to discontinue antibiotics as my suspicion for infectious process is low.  We have also agreed to discontinue IV fluid as she is third  spacing.  We have agreed on comfort measures with emphasis on symptom management.  We have discussed about use of medications such as IV morphine and Ativan to elevate pain, air hunger and anxiety.  I have also introduced about residential hospice.  Patient's father and husband would like to do some research about this over the next 1 to 2 days.  I have consulted palliative care for help with further goals of care discussion and answer their questions.   Objective: Vitals:   08/11/2020 2257 08/22/20 0051 08/22/20 0525 08/22/20 0549  BP:  121/76 119/80   Pulse:  97 (!) 103 99  Resp:  16 14   Temp:  98.8 F (37.1 C) 98.6 F (37 C)   TempSrc:  Oral Oral   SpO2:  97% 98%   Weight:      Height: 5\' 8"  (1.727 m)       Intake/Output Summary (Last 24 hours) at 08/22/2020 1043 Last data filed at 08/22/2020 0900 Gross per 24 hour  Intake 1974.24 ml  Output 200 ml  Net 1774.24 ml   Filed Weights   08/31/2020 1930  Weight: 108.7 kg    Examination:  GENERAL: Somewhat lethargic.  No apparent distress. HEENT: MMM.  Vision and hearing grossly intact.  NECK: Supple.  No apparent JVD.  RESP: On RA.  No IWOB.  Fair aeration bilaterally. CVS:  RRR. Heart sounds normal.  ABD/GI/GU: Somewhat distended abdomen.  No tenderness. MSK/EXT:  Moves extremities. No apparent deformity. No edema.  SKIN: Jaundice. NEURO: Somewhat somnolent and lethargic.  No apparent focal neuro deficit. PSYCH: Calm.  No distress or agitation.  Procedures:  None  Microbiology summarized: [id number] and influenza PCR nonreactive. Blood cultures NGTD. Urine culture pending.  Assessment & Plan: End-of-life care/comfort measures only-poor prognosis. -Discontinued IV antibiotics and IV fluids after discussion with patient's husband and father as above -As needed morphine and Ativan as needed -Family undecided about residential hospice yet -Palliative care consulted for further help.  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 -Emphasis on comfort as above  Hepatic failure/transaminitis/hyperbilirubinemia/jaundice-likely due to malignancy.  AKI/azotemia-likely due to cancer  Coagulopathy-likely due to liver failure  Lactic acidosis-likely due to liver failure  Hyponatremia-likely due to liver failure  Fluid overload/ascites/pleural effusion/edema -Discontinue IV fluid and IV antibiotics  Essential hypertension  Goal of care counseling -Extensive discussion with patient's father and patient's husband as above.  Class I obesity Body mass index is 36.44 kg/m.         DVT prophylaxis:  SCDs Start: 08/27/2020 1237  Code Status: DNR/DNI Family Communication: Patient's father and husband Level of care: Telemetry Status is: Observation  The patient will require care spanning > 2 midnights and should be moved to inpatient because:  End-of-life care/comfort care  Dispo: The patient is from: Home              Anticipated d/c is to:  To be determined              Patient currently is not medically stable to d/c.   Difficult to place patient No       Consultants:  Oncology Palliative medicine   Sch Meds:  Scheduled Meds: Continuous Infusions: PRN Meds:.LORazepam, morphine injection, ondansetron **OR** ondansetron (ZOFRAN) IV  Antimicrobials: Anti-infectives (From admission, onward)    Start     Dose/Rate Route Frequency Ordered Stop   08/27/2020 2000  metroNIDAZOLE (FLAGYL) IVPB 500 mg  Status:  Discontinued        500 mg 100 mL/hr over 60 Minutes Intravenous Every 8 hours 08/15/2020 1332 08/22/20 0956   08/10/2020 1800  ceFEPIme (MAXIPIME) 2 g in sodium chloride 0.9 % 100 mL IVPB  Status:  Discontinued        2 g 200 mL/hr over 30 Minutes Intravenous Every 8 hours 08/25/2020 0945 08/22/20 0956   08/20/2020 0945  ceFEPIme (MAXIPIME) 2 g in sodium chloride 0.9 % 100 mL IVPB        2 g 200 mL/hr over 30 Minutes Intravenous  Once  08/11/2020 0939 08/11/2020 1238   09/02/2020 0945  metroNIDAZOLE (FLAGYL) IVPB 500 mg        500 mg 100 mL/hr over 60 Minutes Intravenous  Once 08/27/2020 0939 08/29/2020 1359        I have personally reviewed the following labs and images: CBC: Recent Labs  Lab 08/16/20 0920 08/07/2020 0934  WBC 4.8 2.3*  NEUTROABS 3.9 2.2  HGB 13.3 13.5  HCT 37.5 39.1  MCV 109.6* 111.1*  PLT 151 107*   BMP &GFR Recent Labs  Lab 08/16/20 0920 08/12/2020 0934 08/30/2020 1333 09/01/2020 1800 08/24/2020 2257  NA 137 129* 128* 128* 129*  K 3.9 4.1 4.1 4.1 5.2*  CL 101 96* 98 97* 99  CO2 20* 16* 13* 16* 17*  GLUCOSE 97 103* 95 131* 117*  BUN 8 40* 41* 42* 47*  CREATININE 0.73 4.18* 4.05* 3.61* 3.72*  CALCIUM 8.8* 8.1* 7.5* 7.2* 7.4*  MG 1.9  --   --   --   --  Estimated Creatinine Clearance: 26.2 mL/min (A) (by C-G formula based on SCr of 3.72 mg/dL (H)). Liver & Pancreas: Recent Labs  Lab 08/16/20 0920 08/20/2020 0934 08/30/2020 1333  AST 483* 512* 444*  ALT 163* 130* 115*  ALKPHOS 392* 337* 276*  BILITOT 21.8* 30.9* 26.8*  PROT 6.1* 6.2* 5.3*  ALBUMIN 2.3* 2.2* 2.0*   Recent Labs  Lab 08/28/2020 1430  LIPASE 77*   Recent Labs  Lab 08/30/2020 0100  AMMONIA 82*   Diabetic: No results for input(s): HGBA1C in the last 72 hours. No results for input(s): GLUCAP in the last 168 hours. Cardiac Enzymes: No results for input(s): CKTOTAL, CKMB, CKMBINDEX, TROPONINI in the last 168 hours. No results for input(s): PROBNP in the last 8760 hours. Coagulation Profile: Recent Labs  Lab 08/08/2020 0100  INR 2.3*   Thyroid Function Tests: No results for input(s): TSH, T4TOTAL, FREET4, T3FREE, THYROIDAB in the last 72 hours. Lipid Profile: No results for input(s): CHOL, HDL, LDLCALC, TRIG, CHOLHDL, LDLDIRECT in the last 72 hours. Anemia Panel: No results for input(s): VITAMINB12, FOLATE, FERRITIN, TIBC, IRON, RETICCTPCT in the last 72 hours. Urine analysis:    Component Value Date/Time   COLORURINE  AMBER (A) 08/20/2020 0934   APPEARANCEUR CLOUDY (A) 08/27/2020 0934   LABSPEC 1.014 08/27/2020 0934   PHURINE 5.0 08/29/2020 0934   GLUCOSEU NEGATIVE 09/02/2020 0934   HGBUR SMALL (A) 08/22/2020 0934   BILIRUBINUR MODERATE (A) 08/07/2020 0934   BILIRUBINUR neg 01/19/2012 1834   KETONESUR NEGATIVE 08/30/2020 0934   PROTEINUR 30 (A) 08/14/2020 0934   UROBILINOGEN 0.2 01/19/2012 1834   UROBILINOGEN 0.2 09/18/2011 1757   NITRITE NEGATIVE 08/05/2020 0934   LEUKOCYTESUR TRACE (A) 08/31/2020 0934   Sepsis Labs: Invalid input(s): PROCALCITONIN, [place]  Microbiology: Recent Results (from the past 240 hour(s))  Blood culture (routine x 2)     Status: None (Preliminary result)   Collection Time: 08/17/2020  9:33 AM   Specimen: BLOOD  Result Value Ref Range Status   Specimen Description   Final    BLOOD BLOOD RIGHT FOREARM Performed at [facility] [address] [place], [place] [address]    Special Requests   Final    BOTTLES DRAWN AEROBIC AND ANAEROBIC Blood Culture adequate volume Performed at [facility] [address] [place], [place] [address]    Culture   Final    NO GROWTH < 24 HOURS Performed at [facility] [address], [place], [place] [address]    Report Status PENDING  Incomplete  Resp Panel by RT-PCR (Flu A&B, Covid) Nasopharyngeal Swab     Status: None   Collection Time: 08/11/2020  9:34 AM   Specimen: Nasopharyngeal Swab; Nasopharyngeal(NP) swabs in vial transport medium  Result Value Ref Range Status   SARS Coronavirus 2 by RT PCR NEGATIVE NEGATIVE Final    Comment: (NOTE) SARS-CoV-2 target nucleic acids are NOT DETECTED.  The SARS-CoV-2 RNA is generally detectable in upper respiratory specimens during the acute phase of infection. The lowest concentration of SARS-CoV-2 viral copies this assay can detect is 138 copies/mL. A negative result does not preclude SARS-Cov-2 infection and should not be used as the  sole basis for treatment or other patient management decisions. A negative result may occur with  improper specimen collection/handling, submission of specimen other than nasopharyngeal swab, presence of viral mutation(s) within the areas targeted by this assay, and inadequate number of viral copies(<138 copies/mL). A negative result must be combined with clinical  observations, patient history, and epidemiological information. The expected result is Negative.  Fact Sheet for Patients:  EntrepreneurPulse.com.au  Fact Sheet for Healthcare Providers:  IncredibleEmployment.be  This test is no t yet approved or cleared by the [place] FDA and  has been authorized for detection and/or diagnosis of SARS-CoV-2 by FDA under an Emergency Use Authorization (EUA). This EUA will remain  in effect (meaning this test can be used) for the duration of the COVID-19 declaration under Section 564(b)(1) of the Act, 21 U.S.C.section 360bbb-3(b)(1), unless the authorization is terminated  or revoked sooner.       Influenza A by PCR NEGATIVE NEGATIVE Final   Influenza B by PCR NEGATIVE NEGATIVE Final    Comment: (NOTE) The Xpert Xpress SARS-CoV-2/FLU/RSV plus assay is intended as an aid in the diagnosis of influenza from Nasopharyngeal swab specimens and should not be used as a sole basis for treatment. Nasal washings and aspirates are unacceptable for Xpert Xpress SARS-CoV-2/FLU/RSV testing.  Fact Sheet for Patients: EntrepreneurPulse.com.au  Fact Sheet for Healthcare Providers: IncredibleEmployment.be  This test is not yet approved or cleared by the [place] FDA and has been authorized for detection and/or diagnosis of SARS-CoV-2 by FDA under an Emergency Use Authorization (EUA). This EUA will remain in effect (meaning this test can be used) for the duration of the COVID-19 declaration under Section 564(b)(1) of the  Act, 21 U.S.C. section 360bbb-3(b)(1), unless the authorization is terminated or revoked.  Performed at [facility], [place] [address], [place], [place] [address]   Blood culture (routine x 2)     Status: None (Preliminary result)   Collection Time: 08/10/2020  9:38 AM   Specimen: BLOOD  Result Value Ref Range Status   Specimen Description   Final    BLOOD RIGHT ANTECUBITAL Performed at [facility] [address], [place], [place] [address]    Special Requests   Final    BOTTLES DRAWN AEROBIC AND ANAEROBIC Blood Culture adequate volume Performed at [facility] [address], [place], [place] [address]    Culture   Final    NO GROWTH < 24 HOURS Performed at [facility] [address] [place], [place] [address]    Report Status PENDING  Incomplete    Radiology Studies: No results found.      Ruthy Forry [address]  If 7PM-7AM, please contact night-coverage [email or web address] 08/22/2020, 10:43 AM

## 2020-08-23 DIAGNOSIS — R17 Unspecified jaundice: Secondary | ICD-10-CM

## 2020-08-23 DIAGNOSIS — N179 Acute kidney failure, unspecified: Secondary | ICD-10-CM | POA: Diagnosis not present

## 2020-08-23 DIAGNOSIS — C787 Secondary malignant neoplasm of liver and intrahepatic bile duct: Secondary | ICD-10-CM

## 2020-08-23 DIAGNOSIS — A419 Sepsis, unspecified organism: Secondary | ICD-10-CM | POA: Diagnosis not present

## 2020-08-23 DIAGNOSIS — Z515 Encounter for palliative care: Secondary | ICD-10-CM | POA: Diagnosis not present

## 2020-08-23 DIAGNOSIS — C50919 Malignant neoplasm of unspecified site of unspecified female breast: Secondary | ICD-10-CM

## 2020-08-23 DIAGNOSIS — I10 Essential (primary) hypertension: Secondary | ICD-10-CM | POA: Diagnosis not present

## 2020-08-23 LAB — URINE CULTURE: Culture: <10000 — AB

## 2020-08-25 ENCOUNTER — Ambulatory Visit: Payer: BC Managed Care – PPO | Admitting: Hematology and Oncology

## 2020-08-25 ENCOUNTER — Ambulatory Visit: Payer: BC Managed Care – PPO

## 2020-08-25 ENCOUNTER — Other Ambulatory Visit: Payer: BC Managed Care – PPO

## 2020-08-26 LAB — CULTURE, BLOOD (ROUTINE X 2)
Culture: NO GROWTH
Culture: NO GROWTH
Special Requests: ADEQUATE
Special Requests: ADEQUATE

## 2020-08-27 DIAGNOSIS — G9341 Metabolic encephalopathy: Secondary | ICD-10-CM | POA: Diagnosis present

## 2020-08-27 DIAGNOSIS — E875 Hyperkalemia: Secondary | ICD-10-CM | POA: Diagnosis present

## 2020-08-27 DIAGNOSIS — R7989 Other specified abnormal findings of blood chemistry: Secondary | ICD-10-CM | POA: Diagnosis present

## 2020-08-27 DIAGNOSIS — D689 Coagulation defect, unspecified: Secondary | ICD-10-CM | POA: Diagnosis present

## 2020-08-27 DIAGNOSIS — K7290 Hepatic failure, unspecified without coma: Secondary | ICD-10-CM

## 2020-08-27 DIAGNOSIS — K831 Obstruction of bile duct: Secondary | ICD-10-CM | POA: Diagnosis present

## 2020-08-27 DIAGNOSIS — E871 Hypo-osmolality and hyponatremia: Secondary | ICD-10-CM | POA: Diagnosis present

## 2020-09-01 ENCOUNTER — Ambulatory Visit: Payer: BC Managed Care – PPO | Admitting: Hematology and Oncology

## 2020-09-01 ENCOUNTER — Ambulatory Visit: Payer: BC Managed Care – PPO

## 2020-09-01 ENCOUNTER — Other Ambulatory Visit: Payer: BC Managed Care – PPO

## 2020-09-02 ENCOUNTER — Ambulatory Visit: Payer: BC Managed Care – PPO

## 2020-09-02 ENCOUNTER — Other Ambulatory Visit: Payer: BC Managed Care – PPO

## 2020-09-02 ENCOUNTER — Ambulatory Visit: Payer: BC Managed Care – PPO | Admitting: Hematology and Oncology

## 2020-09-03 ENCOUNTER — Ambulatory Visit: Payer: BC Managed Care – PPO | Admitting: Registered Nurse

## 2020-09-03 NOTE — Progress Notes (Signed)
Daily Progress Note   Patient Name: Bethany Hanna       Date: 2020/09/19 DOB: 09/09/1980  Age: 40 y.o. MRN#: [id number] Attending Physician: Mercy Riding, MD Primary Care Physician: [name], MD Admit Date: 08/12/2020  Reason for Consultation/Follow-up: Terminal Care  Subjective: I saw and examined Bethany Hanna today.  She appears comfortable my exam.  She does have some sonorous respirations and was noted to have some upper airway secretions as well.  I met with multiple family members at the bedside to discuss signs and symptoms of end-of-life and talk about symptom management.  Provided anticipatory guidance as best possible regarding expectations on physical changes will be seeing as Bethany Hanna approaches the end of her life.  We also discussed recommendation for referral to [facility] and grief support available to them through local hospice agency.  With their permission, I called in touch base with a [facility] liaison to discuss resources for child grief.  PRN Meds: antiseptic oral rinse, diphenhydrAMINE, glycopyrrolate **OR** glycopyrrolate **OR** glycopyrrolate, haloperidol **OR** haloperidol **OR** haloperidol lactate, HYDROmorphone (DILAUDID) injection, lip balm, LORazepam, ondansetron **OR** ondansetron (ZOFRAN) IV, polyvinyl alcohol  Physical Exam        General: No distress noted Heart: Regular rate and rhythm. No murmur appreciated. Lungs: decreased air movement, scattered coarse, some apnea noted Abdomen: Soft, nontender, distended, positive bowel sounds.   Ext: + edema Skin: Some peripheral cooling, jaundiced Neuro: somnolent.    Vital Signs: BP (!) 89/47   Pulse (!) 107   Temp (!) 97.5 F (36.4 C) (Oral)   Resp 14   Ht 5' 8"  (1.727 m)   Wt 108.7 kg   LMP  05/21/2018   SpO2 (!) 80%   BMI 36.44 kg/m  SpO2: SpO2: (!) 80 % O2 Device: O2 Device: Room Air O2 Flow Rate: O2 Flow Rate (L/min): 2 L/min  Intake/output summary: No intake or output data in the 24 hours ending 19-Sep-2020 2043 LBM: Last BM Date: 08/20/2020 Baseline Weight: Weight: 108.7 kg Most recent weight: Weight: 108.7 kg       Palliative Assessment/Data:    Flowsheet Rows    Flowsheet Row Most Recent Value  Intake Tab   Referral Department Hospitalist  Unit at Time of Referral Med/Surg Unit  Palliative Care Primary Diagnosis Cancer  Date Notified 08/22/20  Palliative  Care Type New Palliative care  Reason for referral End of Life Care Assistance  Date of Admission 08/26/2020  Date first seen by Palliative Care 08/22/20  # of days Palliative referral response time 0 Day(s)  # of days IP prior to Palliative referral 1  Clinical Assessment   Palliative Performance Scale Score 10%  Psychosocial & Spiritual Assessment   Palliative Care Outcomes        Patient Active Problem List   Diagnosis Date Noted   Admission for end of life care 08/22/2020   Severe sepsis with acute organ dysfunction ([place]) 08/28/2020   Hypertension    AKI (acute kidney injury) ([place])    Metastatic breast cancer ([place]) 08/16/2020   Hyperbilirubinemia 08/10/2020   Elevated blood pressure reading 08/10/2020   Tachycardia 08/10/2020   Leukopenia 08/10/2020   Thrombocytopenia ([place]) 08/10/2020   Transaminitis 08/10/2020   Breast cancer, left breast ([place]) 11/05/2018   Genetic testing 08/21/2018   Family history of colon cancer    Family history of melanoma    Port-A-Cath in place 06/11/2018   White coat syndrome without hypertension 05/22/2018   Malignant neoplasm of upper-outer quadrant of left breast in female, estrogen receptor positive ([place]) 05/16/2018    Palliative Care Assessment & Plan   Patient Profile: 40 y.o. female  with past medical history of metastatic breast cancer with mets to  liver and bone, hypertension, anxiety, recent hospitalization with jaundice and elevated liver enzymes with hyperbilirubinemia admitted on 08/28/2020 with multiorgan failure.  She was evaluated by oncology as well as primary hospitalist team and recommendation moving forward is for focus on comfort.  Palliative consulted for support and symptom management needs  Recommendations/Plan: Full comfort care She appears to be well controlled from symptom standpoint at this time.  Continue same. Will see what we can do to best connect family with grief resources, particularly for her 2 young sons.  Goals of Care and Additional Recommendations: Limitations on Scope of Treatment: Full Comfort Care  Code Status:    Code Status Orders  (From admission, onward)           Start     Ordered   08/22/20 1547  Do not attempt resuscitation (DNR)  Continuous       Question Answer Comment  In the event of cardiac or respiratory ARREST Do not call a "code blue"   In the event of cardiac or respiratory ARREST Do not perform Intubation, CPR, defibrillation or ACLS   In the event of cardiac or respiratory ARREST Use medication by any route, position, wound care, and other measures to relive pain and suffering. May use oxygen, suction and manual treatment of airway obstruction as needed for comfort.      08/22/20 1552           Code Status History     Date Active Date Inactive Code Status Order ID Comments User Context   08/16/2020 1328 08/22/2020 1552 DNR [id number]  Bethany Milan, MD ED   08/07/2020 1238 08/20/2020 1328 Full Code [id number]  Bethany Milan, MD ED   08/10/2020 1851 08/12/2020 1954 Full Code [id number]  Bethany Desanctis, DO Inpatient   11/05/2018 1515 11/06/2018 1439 Full Code [id number]  Bethany Bookbinder, MD Inpatient   03/01/2016 1644 03/03/2016 1702 Full Code [id number]  Bethany Salina, MD Inpatient   03/01/2016 0329 03/01/2016 1644 Full Code [id number]  Bethany Eagle, RN Inpatient    04/09/2015 0043 04/10/2015 1546 Full Code [id number]  Bethany Confer, MD  ED   08/07/2014 1418 08/09/2014 1417 Full Code [id number]  Bethany Hanna Inpatient   08/06/2014 1408 08/07/2014 1418 Full Code [id number]  Bethany Holster, RN Inpatient       Prognosis:  Hours - Days  Discharge Planning: Anticipated Hospital Death  Care plan was discussed with family at bedside, RN, Dr. Cyndia Skeeters, case management  Thank you for allowing the Palliative Medicine Team to assist in the care of this patient.   Time In: 1230 Time Out: 1310 Total Time 40 Prolonged Time Billed No      Greater than 50%  of this time was spent counseling and coordinating care related to the above assessment and plan.  Micheline Rough, MD  Please contact Palliative Medicine Team phone at [phone] for questions and concerns.

## 2020-09-03 NOTE — Progress Notes (Signed)
RN alerted to changes in patient breathing by pt's father at bedside. Comfort measures only.  Pt having periods of apnea and agonal breathing. Family alerted to decline in patient status. Pt time of death at 43 confirmed by two RNs at bedside and primary MD paged to make aware. Pt family that was not at bedside notified. All pt belongings taken with family the day before. [facility] called and post mortem flow sheet completed. Chaplain and family pastor at bedside for emotional support.

## 2020-09-03 NOTE — TOC Progression Note (Signed)
Transition of Care [facility]) - Progression Note    Patient Details  Name: Bethany Hanna MRN: [id number] Date of Birth: October 12, 1980  Transition of [facility]) CM/SW Contact  [name], [name], RN Phone Number: 08-25-20, 1:20 PM  Clinical Narrative: Spoke to Dr. Milas Kocher care-recommended supportive resources from [facility] rep [name]-left vm await response.           Expected Discharge Plan and Services                                                 Social Determinants of Health (SDOH) Interventions    Readmission Risk Interventions No flowsheet data found.

## 2020-09-03 NOTE — Progress Notes (Signed)
Manufacturing engineer [facility]) liaison note  This liaison asked by [facility] to meet with pt's spouse, Rodman Key, to discuss support available for pt's sons through [employer].  Report exchanged with Dr. Domingo Cocking, PMT, prior to meeting with family.  Met with pt's spouse Rodman Key and pt's cousin Suezanne Jacquet.  Space provided for family to share brief history of illness and challenges with knowing what and how to talk with two boys, ages 76 and 20, about their mother's illness.  Rodman Key shares that he does not want them to see their mother so ill and wishes for them not to see her in the hospital.  This is discussed as a way Rodman Key is trying to protect the boys.  General guidelines shared to use non-euphemistic,  age-appropriate language and to provide space for children to ask questions. Discussion of potential benefits of boys visiting at bedside, with Rodman Key feeling that he prefers the boys to remember their mother as she was at home.  Possibility of transfer to [facility] mentioned, as an environment that might be easier for the children to visit; Rodman Key certain that he prefers for pt to remain at [facility] at this time.  Referral made to [facility];  report exchanged with Mendel Ryder, head of counseling, who agreed to reach out to follow up via [place] this evening.  Contact information given to [place] and to [place].  Thank you for the opportunity to participate in this patient's care.     Domenic Moras, BSN, RN [facility] Liaison [phone] (24h on call)

## 2020-09-03 NOTE — Death Summary Note (Signed)
DEATH SUMMARY   Patient Details  Name: Bethany Hanna MRN: [id number] DOB: 1980/06/20  Admission/Discharge Information   Admit Date:  09/11/2020  Date of Death: Date of Death: September 13, 2020  Time of Death: Time of Death: 06-13-06  Length of Stay: 2  Referring Physician: [name], MD   Reason(s) for Hospitalization  Somnolence, lethargy, confusion and abdominal distention  Diagnoses  Preliminary cause of death: Metastatic breast cancer ([place]) Secondary Diagnoses (including complications and co-morbidities):  Principal Problem:   Metastatic breast cancer ([place]) Active Problems:   Hyperbilirubinemia   Leukopenia due to antineoplastic chemotherapy ([place])   Thrombocytopenia ([place])   Transaminitis   Hypertension   AKI (acute kidney injury) ([place])   Admission for end of life care   Obstructive jaundice   Liver failure ([place])   Coagulopathy ([place])   Hyponatremia   Hyperkalemia   Azotemia   Acute metabolic encephalopathy   Brief Hospital Course (including significant findings, care, treatment, and services provided and events leading to death)  40 year old F with PMH of metastatic breast cancer with mets to liver and bones, HTN, anxiety and recent hospitalization from 6/7-6/9 for jaundice, elevated liver enzymes and hyperbilirubinemia returning with increased somnolence, lethargy, confusion and abdominal distention, and admitted for multiorgan failure likely from metastatic cancer versus infection.     In ED, she was tachycardic to 120s.  WBC 2.3.  INR 2.3.  Lactic acid 4.6.   Na 129.   Cr 4.18.  BUN 40.  AST 512.  ALT 130.  ALP 337.  Total bili 30.9.  VBG with metabolic acidosis.  CXR with decreased lung volumes.  CT abdomen and pelvis without contrast with mild to moderate ascites, increased diffuse mesenteric edema and peripancreatic soft tissue stranding, mild diffuse colonic wall thickening and new tiny bilateral pleural effusion with mild bibasilar atelectasis.  Cultures obtained.   Started on IV cefepime and IV Flagyl for possible sepsis.  she was started on broad-spectrum antibiotics and IV fluid.   Patient was evaluated by oncology who felt prognosis is poor with very limited life expectancy, and recommended supportive care and transition to comfort care.  End-of-life care initiated on 08/22/2020    Pertinent Labs and Studies  Significant Diagnostic Studies CT ABDOMEN PELVIS WO CONTRAST  Result Date: 09/11/2020 CLINICAL DATA:  Metastatic breast carcinoma. Jaundice. Worsening abdominal distension. EXAM: CT ABDOMEN AND PELVIS WITHOUT CONTRAST TECHNIQUE: Multidetector CT imaging of the abdomen and pelvis was performed following the standard protocol without IV contrast. COMPARISON:  08/10/2020 FINDINGS: Lower chest: New mild bibasilar atelectasis. New tiny bilateral pleural effusions. Hepatobiliary: Hepatomegaly remains stable. Diffuse hepatic metastatic disease shown on recent MRI is not well visualized on this unenhanced exam. Prior cholecystectomy. No evidence of biliary obstruction. Mild to moderate ascites shows mild increase since prior study. Pancreas: Mild peripancreatic soft tissue stranding and diffuse mesenteric edema are increased since previous study. This could be due to mild pancreatitis or generalized 3rd spacing. Spleen:  Within normal limits in size. Adrenals/Urinary tract: No evidence of urolithiasis or hydronephrosis. Unremarkable unopacified urinary bladder. Stomach/Bowel: Increased mild diffuse colonic wall thickening, which could be due to colitis or hypoalbuminemia. No evidence of obstruction, focal inflammatory process, or abscess. Vascular/Lymphatic: No pathologically enlarged lymph nodes identified. No evidence of abdominal aortic aneurysm. Reproductive:  No mass or other significant abnormality. Other:  None. Musculoskeletal: Several sclerotic bone metastases in the lumbosacral spine and pelvis remains stable. IMPRESSION: Stable hepatomegaly. Diffuse hepatic  metastatic disease shown on recent MRI is not well visualized  on this unenhanced exam. Stable sclerotic bone metastases. Increased mild to moderate ascites. Increased diffuse mesenteric edema and peripancreatic soft tissue stranding, which could be due to mild pancreatitis or generalized 3rd spacing. Increased mild diffuse colonic wall thickening, which could be due to colitis or hypoalbuminemia. New tiny bilateral pleural effusions and mild bibasilar atelectasis. Electronically Signed   By: Marlaine Hind M.D.   On: 08/25/2020 10:58   CT Abdomen Pelvis Wo Contrast  Result Date: 08/10/2020 CLINICAL DATA:  Breast cancer. Staging. Jaundice for 2 days. Breast cancer 2 years ago. EXAM: CT ABDOMEN AND PELVIS WITHOUT CONTRAST TECHNIQUE: Multidetector CT imaging of the abdomen and pelvis was performed following the standard protocol without IV contrast. COMPARISON:  05/21/2020 FINDINGS: Lower chest: Lingular scarring. Normal heart size without pericardial or pleural effusion. Hepatobiliary: Marked hepatic steatosis and moderate hepatomegaly at 19.2 cm craniocaudal. Given lack of IV contrast and the extent of steatosis, evaluation for liver metastasis is significantly limited. Heterogeneity throughout the liver suggests progressive metastatic disease compared to 05/21/2020. Example area of hypoattenuation within the posterior right hepatic lobe at 12 mm on 29/2. Cholecystectomy, without biliary ductal dilatation. Pancreas: Normal, without mass or ductal dilatation. Spleen: Normal in size, without focal abnormality. Adrenals/Urinary Tract: Normal adrenal glands. No renal calculi or hydronephrosis. No hydroureter or ureteric calculi. No bladder calculi. Stomach/Bowel: Portions of the stomach are underdistended. Submucosal fat prominence within the ascending colon is nonspecific. Normal terminal ileum and appendix. Normal small bowel. Vascular/Lymphatic: Normal caliber of the aorta and branch vessels. No abdominopelvic  adenopathy. Reproductive: Normal uterus and adnexa. Other: Development of small volume abdominopelvic ascites. No evidence of omental or peritoneal disease. No free intraperitoneal air. Musculoskeletal: Widespread sclerotic osseous metastasis again identified. Example in the left pubic bone at 1.9 cm, similar. Sclerosis involving the inferior portion of the L1 vertebral body is felt to be increased, including at 2.3 cm on sagittal image 80. IMPRESSION: 1. Hepatic steatosis and hepatomegaly. Although evaluation for liver lesions is limited secondary to lack of IV contrast and underlying steatosis, the extent of heterogeneity suggests progressive hepatic metastasis compared to 05/21/2020. Pre and post contrast abdominal MRI or PET could confirm. 2. No extrahepatic metastatic disease in the abdomen or pelvis. 3. New small volume abdominopelvic ascites, also suggesting progressive liver disease. 4. Cholecystectomy without biliary duct dilatation. 5. Osseous metastasis, including a more well-defined sclerotic lesion within the L1 vertebral body. This could represent progression or healing of disease. Electronically Signed   By: Abigail Miyamoto M.D.   On: 08/10/2020 14:43   MR 3D Recon At Scanner  Result Date: 08/12/2020 CLINICAL DATA:  Painless jaundice with elevated liver function tests in a 40 year old female. EXAM: MRI ABDOMEN WITH CONTRAST (WITH MRCP) TECHNIQUE: Multiplanar multisequence MR imaging of the abdomen was performed following the administration of intravenous contrast. Heavily T2-weighted images of the biliary and pancreatic ducts were obtained, and three-dimensional MRCP images were rendered by post processing. CONTRAST:  29mL GADAVIST GADOBUTROL 1 MMOL/ML IV SOLN COMPARISON:  None. FINDINGS: Lower chest: Incidental imaging of the lung bases on abdominal MRI without sign of consolidation or evidence of pleural effusion. Limited assessment on abdominal MRI. Hepatobiliary: Moderate to marked hepatic  steatosis with a background of innumerable metastatic foci of variable size. Largest in the posterior RIGHT hepatic lobe measuring 3.2 x five) innumerable foci of smaller areas that show increased T2 signal and enhancement with irregular peripheral enhancement throughout the visual cm. (Image 9/15 and 10/15) Many of these lesion showed peripheral enhancement with  target type features which are classic for metastases. There is evidence of varying degrees of capsular retraction overlying many of these foci. Essentially innumerable lesions are present portal vein is patent. Biliary tree is nondilated. Gallbladder is surgically absent. No intrahepatic biliary duct distension. In fact intrahepatic biliary tree not well visualized. Pancreas: Signs of acute interstitial edematous pancreatitis without focal fluid collection. The gland continues to enhance uniformly and has relative preservation of intrinsic T1 signal throughout. No signs of ductal dilation. Spleen:  Spleen normal size and contour without focal lesion. Adrenals/Urinary Tract: Adrenal glands are normal. Kidneys enhance symmetrically without focal abnormality. No hydronephrosis. Stomach/Bowel: Bowel edema about the duodenum in the setting of acute interstitial pancreatitis. Thickening of the ascending colon and transverse colon. Bowel with limited assessment on abdominal MRI. Much of the colonic thickening appears to represent submucosal fat. Though there is also likely baseline colonic edema Vascular/Lymphatic: Vascular structures in the abdomen are patent without aneurysm of the abdominal aorta up. Enlarged portacaval lymph node (image 51/32) 15 mm short axis. No retroperitoneal adenopathy. Other: Ascites and edema in the anterior pararenal space. Ascites is small volume. Early flank edema bilaterally. Small ascites in a fat containing umbilical hernia. Musculoskeletal: Diffuse bony metastatic disease. Largest lesion showing sclerosis based on T1 and T2  signal in the L1 vertebral body (image 23/6) innumerable foci of enhancement with irregular margins throughout the remainder of the visible spine. IMPRESSION: 1. Signs of acute interstitial pancreatitis without focal fluid collection but with ascites and edema as described. 2. Moderate to marked hepatic steatosis with a background of innumerable metastatic foci of variable size. No biliary duct dilation. Extensive tumor in the liver may explain elevated bilirubin. Consider MR follow-up for tracking of liver metastases given that many of these are quite small and PET scan and CT, particularly in the setting of steatosis with respect to CT assessment, may underestimate the degree of metastatic involvement. 3. Signs of bony metastatic disease. 4. Above findings appear to have worsened since more remote imaging from November. 5. Signs of potential mild colitis, correlate with any symptoms that would suggest portal colopathy in the setting of diffuse hepatic metastases. 6. Enlarged portacaval lymph node. These results will be called to the ordering clinician or representative by the Radiologist Assistant, and communication documented in the PACS or [employer]. Electronically Signed   By: Zetta Bills M.D.   On: 08/12/2020 08:16   DG Chest Port 1 View  Result Date: 08/18/2020 CLINICAL DATA:  Question sepsis EXAM: PORTABLE CHEST 1 VIEW COMPARISON:  05/27/2018 FINDINGS: Hypoventilation with decreased lung volume compared to the prior study. Mild right lower lobe atelectasis. Negative for heart failure, or pneumonia. No pleural effusion. Port-A-Cath has been removed since the prior study. IMPRESSION: Hypoventilation with decreased lung volumes. Electronically Signed   By: Franchot Gallo M.D.   On: 08/05/2020 10:21   MR ABDOMEN WITH MRCP W CONTRAST  Result Date: 08/12/2020 CLINICAL DATA:  Painless jaundice with elevated liver function tests in a 40 year old female. EXAM: MRI ABDOMEN WITH CONTRAST (WITH MRCP)  TECHNIQUE: Multiplanar multisequence MR imaging of the abdomen was performed following the administration of intravenous contrast. Heavily T2-weighted images of the biliary and pancreatic ducts were obtained, and three-dimensional MRCP images were rendered by post processing. CONTRAST:  65mL GADAVIST GADOBUTROL 1 MMOL/ML IV SOLN COMPARISON:  None. FINDINGS: Lower chest: Incidental imaging of the lung bases on abdominal MRI without sign of consolidation or evidence of pleural effusion. Limited assessment on abdominal MRI. Hepatobiliary: Moderate  to marked hepatic steatosis with a background of innumerable metastatic foci of variable size. Largest in the posterior RIGHT hepatic lobe measuring 3.2 x five) innumerable foci of smaller areas that show increased T2 signal and enhancement with irregular peripheral enhancement throughout the visual cm. (Image 9/15 and 10/15) Many of these lesion showed peripheral enhancement with target type features which are classic for metastases. There is evidence of varying degrees of capsular retraction overlying many of these foci. Essentially innumerable lesions are present portal vein is patent. Biliary tree is nondilated. Gallbladder is surgically absent. No intrahepatic biliary duct distension. In fact intrahepatic biliary tree not well visualized. Pancreas: Signs of acute interstitial edematous pancreatitis without focal fluid collection. The gland continues to enhance uniformly and has relative preservation of intrinsic T1 signal throughout. No signs of ductal dilation. Spleen:  Spleen normal size and contour without focal lesion. Adrenals/Urinary Tract: Adrenal glands are normal. Kidneys enhance symmetrically without focal abnormality. No hydronephrosis. Stomach/Bowel: Bowel edema about the duodenum in the setting of acute interstitial pancreatitis. Thickening of the ascending colon and transverse colon. Bowel with limited assessment on abdominal MRI. Much of the colonic  thickening appears to represent submucosal fat. Though there is also likely baseline colonic edema Vascular/Lymphatic: Vascular structures in the abdomen are patent without aneurysm of the abdominal aorta up. Enlarged portacaval lymph node (image 51/32) 15 mm short axis. No retroperitoneal adenopathy. Other: Ascites and edema in the anterior pararenal space. Ascites is small volume. Early flank edema bilaterally. Small ascites in a fat containing umbilical hernia. Musculoskeletal: Diffuse bony metastatic disease. Largest lesion showing sclerosis based on T1 and T2 signal in the L1 vertebral body (image 23/6) innumerable foci of enhancement with irregular margins throughout the remainder of the visible spine. IMPRESSION: 1. Signs of acute interstitial pancreatitis without focal fluid collection but with ascites and edema as described. 2. Moderate to marked hepatic steatosis with a background of innumerable metastatic foci of variable size. No biliary duct dilation. Extensive tumor in the liver may explain elevated bilirubin. Consider MR follow-up for tracking of liver metastases given that many of these are quite small and PET scan and CT, particularly in the setting of steatosis with respect to CT assessment, may underestimate the degree of metastatic involvement. 3. Signs of bony metastatic disease. 4. Above findings appear to have worsened since more remote imaging from November. 5. Signs of potential mild colitis, correlate with any symptoms that would suggest portal colopathy in the setting of diffuse hepatic metastases. 6. Enlarged portacaval lymph node. These results will be called to the ordering clinician or representative by the Radiologist Assistant, and communication documented in the PACS or [employer]. Electronically Signed   By: Zetta Bills M.D.   On: 08/12/2020 08:16    Microbiology Recent Results (from the past 240 hour(s))  Blood culture (routine x 2)     Status: None   Collection  Time: 08/07/2020  9:33 AM   Specimen: BLOOD  Result Value Ref Range Status   Specimen Description   Final    BLOOD BLOOD RIGHT FOREARM Performed at [facility] [address] [place], [place] [address]    Special Requests   Final    BOTTLES DRAWN AEROBIC AND ANAEROBIC Blood Culture adequate volume Performed at [facility] [address] [place], [place] [address]    Culture   Final    NO GROWTH 5 DAYS Performed at [facility], [place] [address], [place], [place] [address]  Report Status 08/26/2020 FINAL  Final  Urine culture     Status: Abnormal   Collection Time: 08/08/2020  9:34 AM   Specimen: Urine, Clean Catch  Result Value Ref Range Status   Specimen Description   Final    URINE, CLEAN CATCH Performed at [facility], [place] [address], [place], [place] [address]    Special Requests   Final    NONE Performed at [facility], [place] [address], [place], [place] [address]    Culture (A)  Final    <10,000 COLONIES/mL INSIGNIFICANT GROWTH Performed at [facility] [address] [place], [place] [address]    Report Status 2020-09-15 FINAL  Final  Resp Panel by RT-PCR (Flu A&B, Covid) Nasopharyngeal Swab     Status: None   Collection Time: 08/29/2020  9:34 AM   Specimen: Nasopharyngeal Swab; Nasopharyngeal(NP) swabs in vial transport medium  Result Value Ref Range Status   SARS Coronavirus 2 by RT PCR NEGATIVE NEGATIVE Final    Comment: (NOTE) SARS-CoV-2 target nucleic acids are NOT DETECTED.  The SARS-CoV-2 RNA is generally detectable in upper respiratory specimens during the acute phase of infection. The lowest concentration of SARS-CoV-2 viral copies this assay can detect is 138 copies/mL. A negative result does not preclude SARS-Cov-2 infection and should not be used as the sole basis for treatment or other patient management decisions. A negative result may occur with  improper  specimen collection/handling, submission of specimen other than nasopharyngeal swab, presence of viral mutation(s) within the areas targeted by this assay, and inadequate number of viral copies(<138 copies/mL). A negative result must be combined with clinical observations, patient history, and epidemiological information. The expected result is Negative.  Fact Sheet for Patients:  EntrepreneurPulse.com.au  Fact Sheet for Healthcare Providers:  IncredibleEmployment.be  This test is no t yet approved or cleared by the [place] FDA and  has been authorized for detection and/or diagnosis of SARS-CoV-2 by FDA under an Emergency Use Authorization (EUA). This EUA will remain  in effect (meaning this test can be used) for the duration of the COVID-19 declaration under Section 564(b)(1) of the Act, 21 U.S.C.section 360bbb-3(b)(1), unless the authorization is terminated  or revoked sooner.       Influenza A by PCR NEGATIVE NEGATIVE Final   Influenza B by PCR NEGATIVE NEGATIVE Final    Comment: (NOTE) The Xpert Xpress SARS-CoV-2/FLU/RSV plus assay is intended as an aid in the diagnosis of influenza from Nasopharyngeal swab specimens and should not be used as a sole basis for treatment. Nasal washings and aspirates are unacceptable for Xpert Xpress SARS-CoV-2/FLU/RSV testing.  Fact Sheet for Patients: EntrepreneurPulse.com.au  Fact Sheet for Healthcare Providers: IncredibleEmployment.be  This test is not yet approved or cleared by the [place] FDA and has been authorized for detection and/or diagnosis of SARS-CoV-2 by FDA under an Emergency Use Authorization (EUA). This EUA will remain in effect (meaning this test can be used) for the duration of the COVID-19 declaration under Section 564(b)(1) of the Act, 21 U.S.C. section 360bbb-3(b)(1), unless the authorization is terminated or revoked.  Performed at  [facility], [place] [address], [place], [place] [address]   Blood culture (routine x 2)     Status: None   Collection Time: 08/20/2020  9:38 AM   Specimen: BLOOD  Result Value Ref Range Status   Specimen Description   Final    BLOOD RIGHT ANTECUBITAL Performed at [facility]  [address], [place], [place] [address]    Special Requests   Final    BOTTLES DRAWN AEROBIC AND ANAEROBIC Blood Culture adequate volume Performed at [facility] [address] [place], [place] [address]    Culture   Final    NO GROWTH 5 DAYS Performed at [facility], [place] [address], [place], [place] [address]    Report Status 08/26/2020 FINAL  Final    Lab Basic Metabolic Panel: Recent Labs  Lab 08/31/2020 0934 08/29/2020 1333 08/10/2020 1800 08/16/2020 2257  NA 129* 128* 128* 129*  K 4.1 4.1 4.1 5.2*  CL 96* 98 97* 99  CO2 16* 13* 16* 17*  GLUCOSE 103* 95 131* 117*  BUN 40* 41* 42* 47*  CREATININE 4.18* 4.05* 3.61* 3.72*  CALCIUM 8.1* 7.5* 7.2* 7.4*   Liver Function Tests: Recent Labs  Lab 08/20/2020 0934 08/17/2020 1333  AST 512* 444*  ALT 130* 115*  ALKPHOS 337* 276*  BILITOT 30.9* 26.8*  PROT 6.2* 5.3*  ALBUMIN 2.2* 2.0*   Recent Labs  Lab 08/20/2020 1430  LIPASE 77*   Recent Labs  Lab 08/20/2020 0100  AMMONIA 82*   CBC: Recent Labs  Lab 08/16/2020 0934  WBC 2.3*  NEUTROABS 2.2  HGB 13.5  HCT 39.1  MCV 111.1*  PLT 107*   Cardiac Enzymes: No results for input(s): CKTOTAL, CKMB, CKMBINDEX, TROPONINI in the last 168 hours. Sepsis Labs: Recent Labs  Lab 08/07/2020 0100 08/07/2020 0933 08/24/2020 0934 08/12/2020 1333  PROCALCITON  --   --   --  27.82  WBC  --   --  2.3*  --   LATICACIDVEN 4.7* 4.6*  --   --     Procedures/Operations  None   Khalessi Blough T Alizah Sills 08/27/2020, 5:53 PM

## 2020-09-03 NOTE — Progress Notes (Signed)
Patient's Body transferred to the morgue.  Patient placement was notified that the Post-Mortem Checklist is complete.

## 2020-09-03 NOTE — Progress Notes (Signed)
PROGRESS NOTE  Bethany Hanna QQI:[id number] DOB: 01-15-1981   PCP: [name], MD  Patient is from: Home.  DOA: 08/31/2020 LOS: 2  Chief complaints:  Chief Complaint  Patient presents with   abdominal swelling   Jaundice     Brief Narrative / Interim history: 40 year old F with PMH of metastatic breast cancer with mets to liver and bones, HTN, anxiety and recent hospitalization from 6/7-6/9 for jaundice, elevated liver enzymes and hyperbilirubinemia returning with increased somnolence, lethargy, confusion and abdominal distention, and admitted for multiorgan failure likely from metastatic cancer versus infection.    In ED, she was tachycardic to 120s.  WBC 2.3.  INR 2.3.  Lactic acid 4.6.   Na 129.   Cr 4.18.  BUN 40.  AST 512.  ALT 130.  ALP 337.  Total bili 30.9.  VBG with metabolic acidosis.  CXR with decreased lung volumes.  CT abdomen and pelvis without contrast with mild to moderate ascites, increased diffuse mesenteric edema and peripancreatic soft tissue stranding, mild diffuse colonic wall thickening and new tiny bilateral pleural effusion with mild bibasilar atelectasis.  Cultures obtained.  Started on IV cefepime and IV Flagyl for possible sepsis.  she was started on broad-spectrum antibiotics and IV fluid.  Patient was evaluated by oncology who felt prognosis is poor with very limited life expectancy, and recommended supportive care and transition to comfort care.  End-of-life care initiated on 08/22/2020    Subjective: Seen and examined earlier this morning.  Remains on IV Dilaudid as needed for discomfort.  Multiple family members at the bedside.  Seems to have slow breathing rate with intermittent agonal breathing.   Objective: Vitals:   08/22/20 0549 08/22/20 1330 09-20-2020 0554 09-20-20 1350  BP:   (!) 83/47 (!) 89/47  Pulse: 99 (!) 101 100 (!) 107  Resp:   14 14  Temp:   (!) 97.5 F (36.4 C) (!) 97.5 F (36.4 C)  TempSrc:   Oral Oral  SpO2:  97% 91% (!)  80%  Weight:      Height:       No intake or output data in the 24 hours ending 2020-09-20 1737  Filed Weights   08/18/2020 1930  Weight: 108.7 kg    Examination:  GENERAL: Lethargic and and very somnolent. HEENT: Sclerae icteric. NECK: Supple.  No apparent JVD.  RESP: Slow respiratory rate with intermittent agonal breathing. MSK/EXT: Bilateral lower extremity edema. SKIN: Jaundice. NEURO: Lethargic and very somnolent PSYCH: Calm.  No distress or agitation.  Procedures:  None  Microbiology summarized: HERDE-08 and influenza PCR nonreactive. Blood cultures NGTD. Urine culture pending.  Assessment & Plan: End-of-life care/comfort measures only-poor prognosis. -Palliative pathway with as needed Dilaudid, Ativan, glycopyrrolate... -Emotional support to family members -Anticipate in-hospital dath  Stage IV breast cancer with hepatic and osseous metastasis-end-stage and poor prognosis per oncology. -Emphasis on comfort as above  Hepatic failure with comma/transaminitis/hyperbilirubinemia/jaundice-likely due to malignancy.  AKI/azotemia-likely due to cancer.  Cannot rule out hepatorenal syndrome.  Coagulopathy-likely due to liver failure  Lactic acidosis-likely due to liver failure  Hyponatremia-likely due to liver failure  Fluid overload/ascites/pleural effusion/edema -Discontinued IV fluid and IV antibiotics  Essential hypertension/hypotension  Goal of care counseling-see progress note from yesterday.  Class I obesity Body mass index is 36.44 kg/m.         DVT prophylaxis:    Code Status: DNR/DNI Family Communication: Patient's family including father, mother, husband, brother and others.  Level of care: Med-Surg Status is: Observation  Status is: Inpatient  Remains inpatient appropriate because:Hemodynamically unstable  Dispo: The patient is from: Home              Anticipated d/c is to:  Anticipated hospital death              Patient currently is  not medically stable to d/c.   Difficult to place patient No             Consultants:  Oncology Palliative medicine   Sch Meds:  Scheduled Meds: Continuous Infusions: PRN Meds:.antiseptic oral rinse, diphenhydrAMINE, glycopyrrolate **OR** glycopyrrolate **OR** glycopyrrolate, haloperidol **OR** haloperidol **OR** haloperidol lactate, HYDROmorphone (DILAUDID) injection, lip balm, LORazepam, ondansetron **OR** ondansetron (ZOFRAN) IV, polyvinyl alcohol  Antimicrobials: Anti-infectives (From admission, onward)    Start     Dose/Rate Route Frequency Ordered Stop   08/05/2020 2000  metroNIDAZOLE (FLAGYL) IVPB 500 mg  Status:  Discontinued        500 mg 100 mL/hr over 60 Minutes Intravenous Every 8 hours 08/09/2020 1332 08/22/20 0956   08/24/2020 1800  ceFEPIme (MAXIPIME) 2 g in sodium chloride 0.9 % 100 mL IVPB  Status:  Discontinued        2 g 200 mL/hr over 30 Minutes Intravenous Every 8 hours 08/17/2020 0945 08/22/20 0956   08/30/2020 0945  ceFEPIme (MAXIPIME) 2 g in sodium chloride 0.9 % 100 mL IVPB        2 g 200 mL/hr over 30 Minutes Intravenous  Once 08/29/2020 0939 08/09/2020 1238   08/05/2020 0945  metroNIDAZOLE (FLAGYL) IVPB 500 mg        500 mg 100 mL/hr over 60 Minutes Intravenous  Once 08/09/2020 0939 08/16/2020 1359        I have personally reviewed the following labs and images: CBC: Recent Labs  Lab 08/06/2020 0934  WBC 2.3*  NEUTROABS 2.2  HGB 13.5  HCT 39.1  MCV 111.1*  PLT 107*   BMP &GFR Recent Labs  Lab 08/10/2020 0934 08/19/2020 1333 08/13/2020 1800 08/25/2020 2257  NA 129* 128* 128* 129*  K 4.1 4.1 4.1 5.2*  CL 96* 98 97* 99  CO2 16* 13* 16* 17*  GLUCOSE 103* 95 131* 117*  BUN 40* 41* 42* 47*  CREATININE 4.18* 4.05* 3.61* 3.72*  CALCIUM 8.1* 7.5* 7.2* 7.4*   Estimated Creatinine Clearance: 26.2 mL/min (A) (by C-G formula based on SCr of 3.72 mg/dL (H)). Liver & Pancreas: Recent Labs  Lab 08/25/2020 0934 09/01/2020 1333  AST 512* 444*  ALT 130* 115*   ALKPHOS 337* 276*  BILITOT 30.9* 26.8*  PROT 6.2* 5.3*  ALBUMIN 2.2* 2.0*   Recent Labs  Lab 08/18/2020 1430  LIPASE 77*   Recent Labs  Lab 08/27/2020 0100  AMMONIA 82*   Diabetic: No results for input(s): HGBA1C in the last 72 hours. No results for input(s): GLUCAP in the last 168 hours. Cardiac Enzymes: No results for input(s): CKTOTAL, CKMB, CKMBINDEX, TROPONINI in the last 168 hours. No results for input(s): PROBNP in the last 8760 hours. Coagulation Profile: Recent Labs  Lab 08/30/2020 0100  INR 2.3*   Thyroid Function Tests: No results for input(s): TSH, T4TOTAL, FREET4, T3FREE, THYROIDAB in the last 72 hours. Lipid Profile: No results for input(s): CHOL, HDL, LDLCALC, TRIG, CHOLHDL, LDLDIRECT in the last 72 hours. Anemia Panel: No results for input(s): VITAMINB12, FOLATE, FERRITIN, TIBC, IRON, RETICCTPCT in the last 72 hours. Urine analysis:    Component Value Date/Time   COLORURINE AMBER (A) 08/26/2020 0934   APPEARANCEUR CLOUDY (  A) 08/20/2020 0934   LABSPEC 1.014 08/12/2020 0934   PHURINE 5.0 08/12/2020 [place] 08/10/2020 0934   HGBUR SMALL (A) 08/19/2020 0934   BILIRUBINUR MODERATE (A) 08/06/2020 0934   BILIRUBINUR neg 01/19/2012 1834   KETONESUR NEGATIVE 08/26/2020 0934   PROTEINUR 30 (A) 08/17/2020 0934   UROBILINOGEN 0.2 01/19/2012 1834   UROBILINOGEN 0.2 09/18/2011 1757   NITRITE NEGATIVE 08/29/2020 0934   LEUKOCYTESUR TRACE (A) 08/18/2020 0934   Sepsis Labs: Invalid input(s): PROCALCITONIN, [place]  Microbiology: Recent Results (from the past 240 hour(s))  Blood culture (routine x 2)     Status: None (Preliminary result)   Collection Time: 08/12/2020  9:33 AM   Specimen: BLOOD  Result Value Ref Range Status   Specimen Description   Final    BLOOD BLOOD RIGHT FOREARM Performed at [facility] [address], [place], [place] [address]    Special Requests   Final    BOTTLES DRAWN AEROBIC AND ANAEROBIC  Blood Culture adequate volume Performed at [facility] [address], [place], [place] [address]    Culture   Final    NO GROWTH 2 DAYS Performed at [facility] [address], [place], [place] [address]    Report Status PENDING  Incomplete  Urine culture     Status: Abnormal   Collection Time: 08/04/2020  9:34 AM   Specimen: Urine, Clean Catch  Result Value Ref Range Status   Specimen Description   Final    URINE, CLEAN CATCH Performed at [name], [place] [address], [place], [place] [address]    Special Requests   Final    NONE Performed at [facility], [place] [address], [place], [place] [address]    Culture (A)  Final    <10,000 COLONIES/mL INSIGNIFICANT GROWTH Performed at [facility] [address], [place], [place] [address]    Report Status 09/12/20 FINAL  Final  Resp Panel by RT-PCR (Flu A&B, Covid) Nasopharyngeal Swab     Status: None   Collection Time: 08/20/2020  9:34 AM   Specimen: Nasopharyngeal Swab; Nasopharyngeal(NP) swabs in vial transport medium  Result Value Ref Range Status   SARS Coronavirus 2 by RT PCR NEGATIVE NEGATIVE Final    Comment: (NOTE) SARS-CoV-2 target nucleic acids are NOT DETECTED.  The SARS-CoV-2 RNA is generally detectable in upper respiratory specimens during the acute phase of infection. The lowest concentration of SARS-CoV-2 viral copies this assay can detect is 138 copies/mL. A negative result does not preclude SARS-Cov-2 infection and should not be used as the sole basis for treatment or other patient management decisions. A negative result may occur with  improper specimen collection/handling, submission of specimen other than nasopharyngeal swab, presence of viral mutation(s) within the areas targeted by this assay, and inadequate number of viral copies(<138 copies/mL). A negative result must be combined with clinical observations, patient history, and  epidemiological information. The expected result is Negative.  Fact Sheet for Patients:  EntrepreneurPulse.com.au  Fact Sheet for Healthcare Providers:  IncredibleEmployment.be  This test is no t yet approved or cleared by the [place] FDA and  has been authorized for detection and/or diagnosis of SARS-CoV-2 by FDA under an Emergency Use Authorization (EUA). This EUA will remain  in effect (meaning this test can be used) for the duration of the COVID-19 declaration under Section 564(b)(1) of the Act, 21 U.S.C.section 360bbb-3(b)(1), unless the authorization is terminated  or revoked  sooner.       Influenza A by PCR NEGATIVE NEGATIVE Final   Influenza B by PCR NEGATIVE NEGATIVE Final    Comment: (NOTE) The Xpert Xpress SARS-CoV-2/FLU/RSV plus assay is intended as an aid in the diagnosis of influenza from Nasopharyngeal swab specimens and should not be used as a sole basis for treatment. Nasal washings and aspirates are unacceptable for Xpert Xpress SARS-CoV-2/FLU/RSV testing.  Fact Sheet for Patients: EntrepreneurPulse.com.au  Fact Sheet for Healthcare Providers: IncredibleEmployment.be  This test is not yet approved or cleared by the [place] FDA and has been authorized for detection and/or diagnosis of SARS-CoV-2 by FDA under an Emergency Use Authorization (EUA). This EUA will remain in effect (meaning this test can be used) for the duration of the COVID-19 declaration under Section 564(b)(1) of the Act, 21 U.S.C. section 360bbb-3(b)(1), unless the authorization is terminated or revoked.  Performed at [facility], [place] [address], [place], [place] [address]   Blood culture (routine x 2)     Status: None (Preliminary result)   Collection Time: 08/09/2020  9:38 AM   Specimen: BLOOD  Result Value Ref Range Status   Specimen Description   Final    BLOOD RIGHT  ANTECUBITAL Performed at [facility] [address], [place], [place] [address]    Special Requests   Final    BOTTLES DRAWN AEROBIC AND ANAEROBIC Blood Culture adequate volume Performed at [facility] [address] [place], [place] [address]    Culture   Final    NO GROWTH 2 DAYS Performed at [facility] [address], [place], [place] [address]    Report Status PENDING  Incomplete    Radiology Studies: No results found.      Fianna Snowball T. [place]  If 7PM-7AM, please contact night-coverage [email or web address] September 02, 2020, 5:37 PM

## 2020-09-03 NOTE — Progress Notes (Signed)
[place] paged to provide support to family at pt.'s bedside in the wake of pt.'s death.  When [facility] arrived, pt.'s husband, parents, aunts, cousins all present in rm. at bedside grieving acutely.  At length family requested prayer and [place] gathered family around bedside in prayer for comfort and divine presence.  Family pastor en route; [place] met with individual family members and offered support.  Pt. has two young boys, ages 60 and 59, who are staying in [place] with their paternal grandparents.  [place] left family in care of family pastor Loma Sousa when she arrived.  Lindaann Pascal PRN Chaplain Pager: [phone]

## 2020-09-03 NOTE — Progress Notes (Signed)
The eyes were prepared for possible donorship

## 2020-09-03 DEATH — deceased

## 2020-09-15 NOTE — Progress Notes (Signed)
Received refill request via Fax requesting insurance update information.  RN return fax with update on expiration.

## 2020-12-25 NOTE — Progress Notes (Signed)
Established Patient Office Visit  Subjective:  Patient ID: Bethany Hanna, female    DOB: [birth year]  Age: 40 y.o. MRN: [id number]  CC:  Chief Complaint  Patient presents with   Hypertension    Patient states she thinks she need something for HTN. Patient states she is noticing everything is different after she had covid may 5th. Yesterday she got dizzy BP was 163/114 , 183/117, 138/87. She thought she had a panic attack maybe.    HPI Bethany Hanna presents for concern for elevated bp  History as below - most notable for breast cancer with mets to liver and bone.  She has had elevated home bp to 180s/110s. Some dizziness. No chest pain, headache, visual changes, claudication.  Concern that she has had a panic attack. She notes anxiety worse at night. Often trouble sleeping. She has a lot of concern in regards to her diagnoses.  Past Medical History:  Diagnosis Date   Anxiety    SITUational -uses xanax during the day, reports the ativan is for bedtime   Elevated blood pressure reading in office with white coat syndrome, without diagnosis of hypertension    Family history of colon cancer    Family history of melanoma    GERD (gastroesophageal reflux disease)    History of cancer chemotherapy    left breast cancer--- 05-28-2018 to 10-08-2018   History of migraine    Hypertension    Malignant neoplasm of upper-outer quadrant of left breast in female, estrogen receptor positive [facility]) oncologist--- dr Lindi Adie   dx 05-13-2018, clinical stage IIB, grade 3, invasive mammary carcinoma (ER/PR +, HER2 negative);  completed chemotherapy 10-08-2018;  11-05-2018 s/p left breast lumpectomy w/ node dissection's and Re-excision 11-18-2018;  started radiation therapy 12-30-2018 to complete 02-14-2019   PONV (postoperative nausea and vomiting)    Radiation burn    02-13-2019  per pt left breast , current radiation treatment    Past Surgical History:  Procedure Laterality Date   BREAST  LUMPECTOMY WITH RADIOACTIVE SEED AND AXILLARY LYMPH NODE DISSECTION Left 11/05/2018   Procedure: LEFT BREAST LUMPECTOMY WITH BRACKETED RADIOACTIVE SEED AND LEFT AXILLARY LYMPH NODE DISSECTION;  Surgeon: [name], MD;  Location: [facility];  Service: General;  Laterality: Left;   CHOLECYSTECTOMY N/A 04/09/2015   Procedure: LAPAROSCOPIC CHOLECYSTECTOMY WITH INTRAOPERATIVE CHOLANGIOGRAM;  Surgeon: Autumn Messing III, MD;  Location: [facility];  Service: General;  Laterality: N/A;   DILATATION & CURRETTAGE/HYSTEROSCOPY WITH RESECTOCOPE N/A 07/31/2013   Procedure: [place];  Surgeon: Princess Bruins, MD;  Location: [facility];  Service: Gynecology;  Laterality: N/A;  1 hr.   DILATION AND EVACUATION N/A 12/12/2017   Procedure: DILATATION AND EVACUATION (D&E) 2ND TRIMESTER;  Surgeon: Brien Few, MD;  Location: [facility];  Service: Gynecology;  Laterality: N/A;   OPERATIVE ULTRASOUND N/A 12/12/2017   Procedure: OPERATIVE ULTRASOUND;  Surgeon: Brien Few, MD;  Location: [facility];  Service: Gynecology;  Laterality: N/A;   PORT-A-CATH REMOVAL Right 11/05/2018   Procedure: REMOVAL PORT-A-CATH;  Surgeon: [name], MD;  Location: [facility];  Service: General;  Laterality: Right;   PORTACATH PLACEMENT Right 05/27/2018   Procedure: INSERTION PORT-A-CATH WITH ULTRASOUND;  Surgeon: [name], MD;  Location: [facility];  Service: General;  Laterality: Right;  GENERAL AND LMA   RE-EXCISION OF BREAST CANCER,SUPERIOR MARGINS Left 11/18/2018   Procedure: RE-EXCISION OF LEFT BREAST MARGIN;  Surgeon: [name], MD;  Location: [facility];  Service: General;  Laterality: Left;  ROBOTIC ASSISTED SALPINGO OOPHERECTOMY Bilateral 02/18/2019   Procedure: XI ROBOTIC ASSISTED SALPINGO OOPHORECTOMY;  Surgeon: Brien Few, MD;  Location: [facility];  Service: Gynecology;  Laterality: Bilateral;  Requests 1 hr. RNFA Tracie confirmed on 02/13/19 CS     Family History  Problem Relation Age of Onset   Arthritis Mother    Migraines Mother    ADD / ADHD Mother    Varicose Veins Mother    Hypertension Father    Alcohol abuse Brother    Varicose Veins Brother    Stroke Maternal Grandmother    Melanoma Maternal Grandmother    Pulmonary fibrosis Maternal Grandfather    Mitral valve prolapse Paternal Grandmother    Migraines Paternal Grandmother    Lymphoma Paternal Grandfather        was treated for this and did ok   Heart failure Paternal Grandfather    Eczema Son    Colon cancer Maternal Aunt 34    Social History   Socioeconomic History   Marital status: Married    Spouse name: Not on file   Number of children: Not on file   Years of education: Not on file   Highest education level: Not on file  Occupational History   Not on file  Tobacco Use   Smoking status: Never   Smokeless tobacco: Never  Vaping Use   Vaping Use: Never used  Substance and Sexual Activity   Alcohol use: Yes    Alcohol/week: 2.0 - 3.0 standard drinks    Types: 2 - 3 Glasses of wine per week   Drug use: No   Sexual activity: Yes  Other Topics Concern   Not on file  Social History Narrative   Married- see husband Catalina Antigua- have 66 and 65 year old in 2020.       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 (Patient taking differently: Take 0.5 mg by mouth daily as needed for anxiety.) 60 tablet  3   Biotin 10 MG CAPS Take by mouth.     calcium carbonate (TUMS - DOSED IN MG ELEMENTAL CALCIUM) 500 MG chewable tablet Chew 1 tablet by mouth as needed for indigestion or heartburn.      IBRANCE 125 MG tablet TAKE 1 TABLET BY MOUTH ONCE DAILY WITH OR WITHOUT FOOD  FOR 21 DAYS, FOLLOWED BY 7  DAYS OFF, REPEATED EVERY 28 DAYS 21 tablet 2   letrozole (FEMARA) 2.5 MG tablet Take 1 tablet (2.5 mg total) by mouth daily. 90 tablet 3   Multiple Vitamins-Minerals (WOMENS MULTI VITAMIN & MINERAL) TABS Take 1 tablet by mouth daily.     0.9 %  sodium chloride infusion      No facility-administered medications prior to visit.    No Known Allergies  ROS Review of Systems  Constitutional: Negative.   HENT: Negative.    Eyes: Negative.   Respiratory: Negative.  Cardiovascular: Negative.   Gastrointestinal: Negative.   Genitourinary: Negative.   Musculoskeletal: Negative.   Skin: Negative.   Neurological: Negative.   Psychiatric/Behavioral: Negative.    All other systems reviewed and are negative.    Objective:    Physical Exam Vitals and nursing note reviewed.  Constitutional:      General: She is not in acute distress.    Appearance: Normal appearance. She is normal weight. She is not ill-appearing, toxic-appearing or diaphoretic.  Cardiovascular:     Rate and Rhythm: Normal rate and regular rhythm.     Heart sounds: Normal heart sounds. No murmur heard.   No friction rub. No gallop.  Pulmonary:     Effort: Pulmonary effort is normal. No respiratory distress.     Breath sounds: Normal breath sounds. No stridor. No wheezing, rhonchi or rales.  Chest:     Chest wall: No tenderness.  Skin:    General: Skin is warm and dry.  Neurological:     General: No focal deficit present.     Mental Status: She is alert and oriented to person, place, and time. Mental status is at baseline.  Psychiatric:        Mood and Affect: Mood normal.        Behavior: Behavior normal.        Thought  Content: Thought content normal.        Judgment: Judgment normal.    BP (!) 149/102   Pulse (!) 120   Temp 98 F (36.7 C) (Temporal)   Resp 18   Ht $R'5\' 8"'GU$  (1.727 m)   Wt 213 lb 3.2 oz (96.7 kg)   LMP 05/21/2018   SpO2 99%   BMI 32.42 kg/m  Wt Readings from Last 3 Encounters:  09-20-20 239 lb 10.2 oz (108.7 kg)  08/16/20 225 lb 11.2 oz (102.4 kg)  08/12/20 213 lb 3 oz (96.7 kg)     There are no preventive care reminders to display for this patient.  There are no preventive care reminders to display for this patient.  No results found for: TSH Lab Results  Component Value Date   WBC 2.3 (L) 08/22/2020   HGB 13.5 08/25/2020   HCT 39.1 08/19/2020   MCV 111.1 (H) 08/25/2020   PLT 107 (L) 08/22/2020   Lab Results  Component Value Date   NA 129 (L) 08/11/2020   K 5.2 (H) 08/26/2020   CO2 17 (L) 08/10/2020   GLUCOSE 117 (H) 08/13/2020   BUN 47 (H) 08/18/2020   CREATININE 3.72 (H) 09/02/2020   BILITOT 26.8 (HH) 08/24/2020   ALKPHOS 276 (H) 08/18/2020   AST 444 (H) 08/07/2020   ALT 115 (H) 08/10/2020   PROT 5.3 (L) 08/22/2020   ALBUMIN 2.0 (L) 08/18/2020   CALCIUM 7.4 (L) 08/05/2020   ANIONGAP 13 08/29/2020   No results found for: CHOL No results found for: HDL No results found for: LDLCALC No results found for: TRIG No results found for: CHOLHDL No results found for: HGBA1C    Assessment & Plan:   Problem List Items Addressed This Visit   None Visit Diagnoses     Sleep disturbance    -  Primary   Relevant Medications   traZODone (DESYREL) 50 MG tablet   Essential hypertension           Meds ordered this encounter  Medications   DISCONTD: amLODipine (NORVASC) 5 MG tablet    Sig: Take 1 tablet (5 mg total) by mouth daily.  Dispense:  90 tablet    Refill:  0    Order Specific Question:   Supervising Provider    Answer:   Carlota Raspberry, JEFFREY R [[id number]]   traZODone (DESYREL) 50 MG tablet    Sig: Take 0.5-1 tablets (25-50 mg total) by mouth at bedtime  as needed for sleep.    Dispense:  30 tablet    Refill:  3    Order Specific Question:   Supervising Provider    Answer:   Carlota Raspberry, JEFFREY R [[id number]]    Follow-up: Return in about 4 weeks (around 09/01/2020) for med check.   PLAN BP elevated in office today. Will start low dose amlodipine. Follow up in 2-4 weeks with myself or PCP. Trazodone 25-47m po qhs prn for sleep. Can consider benzodiazepines if she continues to have severe anxiety. Patient encouraged to call clinic with any questions, comments, or concerns.  RMaximiano Coss NP
